# Patient Record
Sex: Female | Born: 1945 | Race: White | Hispanic: No | Smoking: Current every day smoker
Health system: Southern US, Community
[De-identification: ages and names within clinical notes are randomized; demographics above are authoritative.]

## PROBLEM LIST (undated history)

## (undated) DIAGNOSIS — I10 Essential (primary) hypertension: Secondary | ICD-10-CM

## (undated) DIAGNOSIS — K219 Gastro-esophageal reflux disease without esophagitis: Secondary | ICD-10-CM

## (undated) DIAGNOSIS — N289 Disorder of kidney and ureter, unspecified: Secondary | ICD-10-CM

## (undated) DIAGNOSIS — F319 Bipolar disorder, unspecified: Secondary | ICD-10-CM

## (undated) DIAGNOSIS — F419 Anxiety disorder, unspecified: Secondary | ICD-10-CM

## (undated) DIAGNOSIS — F209 Schizophrenia, unspecified: Secondary | ICD-10-CM

## (undated) DIAGNOSIS — F329 Major depressive disorder, single episode, unspecified: Secondary | ICD-10-CM

## (undated) DIAGNOSIS — J449 Chronic obstructive pulmonary disease, unspecified: Secondary | ICD-10-CM

## (undated) DIAGNOSIS — M1990 Unspecified osteoarthritis, unspecified site: Secondary | ICD-10-CM

## (undated) DIAGNOSIS — K228 Other specified diseases of esophagus: Secondary | ICD-10-CM

## (undated) DIAGNOSIS — R2681 Unsteadiness on feet: Secondary | ICD-10-CM

## (undated) DIAGNOSIS — M4800 Spinal stenosis, site unspecified: Secondary | ICD-10-CM

## (undated) DIAGNOSIS — M419 Scoliosis, unspecified: Secondary | ICD-10-CM

## (undated) DIAGNOSIS — L639 Alopecia areata, unspecified: Secondary | ICD-10-CM

## (undated) HISTORY — DX: Spinal stenosis, site unspecified: M48.00

## (undated) HISTORY — DX: Scoliosis, unspecified: M41.9

## (undated) HISTORY — DX: Essential (primary) hypertension: I10

## (undated) HISTORY — DX: Alopecia areata, unspecified: L63.9

## (undated) HISTORY — DX: Chronic obstructive pulmonary disease, unspecified: J44.9

## (undated) HISTORY — DX: Major depressive disorder, single episode, unspecified: F32.9

## (undated) HISTORY — DX: Gastro-esophageal reflux disease without esophagitis: K21.9

## (undated) HISTORY — DX: Anxiety disorder, unspecified: F41.9

## (undated) HISTORY — DX: Disorder of kidney and ureter, unspecified: N28.9

## (undated) HISTORY — DX: Unsteadiness on feet: R26.81

## (undated) HISTORY — DX: Schizophrenia, unspecified: F20.9

## (undated) HISTORY — DX: Bipolar disorder, unspecified: F31.9

## (undated) HISTORY — DX: Unspecified osteoarthritis, unspecified site: M19.90

## (undated) HISTORY — PX: HIP FRACTURE SURGERY: SHX118

## (undated) HISTORY — DX: Other specified diseases of esophagus: K22.8

---

## 2002-03-31 LAB — HM COLONOSCOPY: HM Colonoscopy: NORMAL

## 2003-06-23 ENCOUNTER — Other Ambulatory Visit: Payer: Self-pay

## 2005-09-29 ENCOUNTER — Other Ambulatory Visit: Payer: Self-pay

## 2005-09-29 ENCOUNTER — Emergency Department: Payer: Self-pay | Admitting: Emergency Medicine

## 2006-06-06 ENCOUNTER — Ambulatory Visit: Payer: Self-pay

## 2006-10-09 LAB — HM DEXA SCAN

## 2006-10-19 ENCOUNTER — Emergency Department: Payer: Self-pay | Admitting: Emergency Medicine

## 2008-12-27 ENCOUNTER — Ambulatory Visit: Payer: Self-pay | Admitting: Family Medicine

## 2009-10-26 ENCOUNTER — Inpatient Hospital Stay: Payer: Self-pay | Admitting: *Deleted

## 2009-12-22 ENCOUNTER — Observation Stay: Payer: Self-pay | Admitting: Internal Medicine

## 2010-02-13 ENCOUNTER — Inpatient Hospital Stay: Payer: Self-pay | Admitting: Specialist

## 2010-10-03 ENCOUNTER — Ambulatory Visit: Payer: Self-pay | Admitting: Gastroenterology

## 2011-04-18 LAB — HM PAP SMEAR: HM Pap smear: NEGATIVE

## 2011-07-19 DIAGNOSIS — F209 Schizophrenia, unspecified: Secondary | ICD-10-CM | POA: Diagnosis not present

## 2011-10-11 DIAGNOSIS — F259 Schizoaffective disorder, unspecified: Secondary | ICD-10-CM | POA: Diagnosis not present

## 2011-10-11 DIAGNOSIS — F329 Major depressive disorder, single episode, unspecified: Secondary | ICD-10-CM | POA: Diagnosis not present

## 2011-11-08 DIAGNOSIS — F259 Schizoaffective disorder, unspecified: Secondary | ICD-10-CM | POA: Diagnosis not present

## 2011-11-08 DIAGNOSIS — F329 Major depressive disorder, single episode, unspecified: Secondary | ICD-10-CM | POA: Diagnosis not present

## 2011-12-06 DIAGNOSIS — F259 Schizoaffective disorder, unspecified: Secondary | ICD-10-CM | POA: Diagnosis not present

## 2011-12-06 DIAGNOSIS — F329 Major depressive disorder, single episode, unspecified: Secondary | ICD-10-CM | POA: Diagnosis not present

## 2011-12-27 DIAGNOSIS — J441 Chronic obstructive pulmonary disease with (acute) exacerbation: Secondary | ICD-10-CM | POA: Diagnosis not present

## 2012-01-16 DIAGNOSIS — J441 Chronic obstructive pulmonary disease with (acute) exacerbation: Secondary | ICD-10-CM | POA: Diagnosis not present

## 2012-02-07 DIAGNOSIS — F329 Major depressive disorder, single episode, unspecified: Secondary | ICD-10-CM | POA: Diagnosis not present

## 2012-02-07 DIAGNOSIS — F259 Schizoaffective disorder, unspecified: Secondary | ICD-10-CM | POA: Diagnosis not present

## 2012-02-18 DIAGNOSIS — J449 Chronic obstructive pulmonary disease, unspecified: Secondary | ICD-10-CM | POA: Diagnosis not present

## 2012-02-26 DIAGNOSIS — F259 Schizoaffective disorder, unspecified: Secondary | ICD-10-CM | POA: Diagnosis not present

## 2012-04-03 DIAGNOSIS — F259 Schizoaffective disorder, unspecified: Secondary | ICD-10-CM | POA: Diagnosis not present

## 2012-04-03 DIAGNOSIS — F329 Major depressive disorder, single episode, unspecified: Secondary | ICD-10-CM | POA: Diagnosis not present

## 2012-05-15 DIAGNOSIS — Z23 Encounter for immunization: Secondary | ICD-10-CM | POA: Diagnosis not present

## 2012-06-12 DIAGNOSIS — F259 Schizoaffective disorder, unspecified: Secondary | ICD-10-CM | POA: Diagnosis not present

## 2012-06-12 DIAGNOSIS — F329 Major depressive disorder, single episode, unspecified: Secondary | ICD-10-CM | POA: Diagnosis not present

## 2012-08-12 DIAGNOSIS — F259 Schizoaffective disorder, unspecified: Secondary | ICD-10-CM | POA: Diagnosis not present

## 2012-08-12 DIAGNOSIS — F329 Major depressive disorder, single episode, unspecified: Secondary | ICD-10-CM | POA: Diagnosis not present

## 2012-11-10 DIAGNOSIS — N3090 Cystitis, unspecified without hematuria: Secondary | ICD-10-CM | POA: Diagnosis not present

## 2012-11-20 DIAGNOSIS — N390 Urinary tract infection, site not specified: Secondary | ICD-10-CM | POA: Diagnosis not present

## 2012-11-20 DIAGNOSIS — N3090 Cystitis, unspecified without hematuria: Secondary | ICD-10-CM | POA: Diagnosis not present

## 2012-11-26 DIAGNOSIS — N390 Urinary tract infection, site not specified: Secondary | ICD-10-CM | POA: Diagnosis not present

## 2012-11-27 DIAGNOSIS — N390 Urinary tract infection, site not specified: Secondary | ICD-10-CM | POA: Diagnosis not present

## 2012-11-27 DIAGNOSIS — Z9181 History of falling: Secondary | ICD-10-CM | POA: Diagnosis not present

## 2012-11-27 DIAGNOSIS — J449 Chronic obstructive pulmonary disease, unspecified: Secondary | ICD-10-CM | POA: Diagnosis not present

## 2012-11-27 DIAGNOSIS — F411 Generalized anxiety disorder: Secondary | ICD-10-CM | POA: Diagnosis not present

## 2012-11-27 DIAGNOSIS — M159 Polyosteoarthritis, unspecified: Secondary | ICD-10-CM | POA: Diagnosis not present

## 2012-11-27 DIAGNOSIS — F209 Schizophrenia, unspecified: Secondary | ICD-10-CM | POA: Diagnosis not present

## 2012-11-28 DIAGNOSIS — N390 Urinary tract infection, site not specified: Secondary | ICD-10-CM | POA: Diagnosis not present

## 2012-11-28 DIAGNOSIS — F411 Generalized anxiety disorder: Secondary | ICD-10-CM | POA: Diagnosis not present

## 2012-11-28 DIAGNOSIS — M159 Polyosteoarthritis, unspecified: Secondary | ICD-10-CM | POA: Diagnosis not present

## 2012-11-28 DIAGNOSIS — J449 Chronic obstructive pulmonary disease, unspecified: Secondary | ICD-10-CM | POA: Diagnosis not present

## 2012-11-28 DIAGNOSIS — F209 Schizophrenia, unspecified: Secondary | ICD-10-CM | POA: Diagnosis not present

## 2012-11-28 DIAGNOSIS — Z9181 History of falling: Secondary | ICD-10-CM | POA: Diagnosis not present

## 2012-11-29 DIAGNOSIS — J449 Chronic obstructive pulmonary disease, unspecified: Secondary | ICD-10-CM | POA: Diagnosis not present

## 2012-11-29 DIAGNOSIS — F411 Generalized anxiety disorder: Secondary | ICD-10-CM | POA: Diagnosis not present

## 2012-11-29 DIAGNOSIS — M159 Polyosteoarthritis, unspecified: Secondary | ICD-10-CM | POA: Diagnosis not present

## 2012-11-29 DIAGNOSIS — N390 Urinary tract infection, site not specified: Secondary | ICD-10-CM | POA: Diagnosis not present

## 2012-11-29 DIAGNOSIS — Z9181 History of falling: Secondary | ICD-10-CM | POA: Diagnosis not present

## 2012-11-29 DIAGNOSIS — F209 Schizophrenia, unspecified: Secondary | ICD-10-CM | POA: Diagnosis not present

## 2012-11-30 DIAGNOSIS — F411 Generalized anxiety disorder: Secondary | ICD-10-CM | POA: Diagnosis not present

## 2012-11-30 DIAGNOSIS — J449 Chronic obstructive pulmonary disease, unspecified: Secondary | ICD-10-CM | POA: Diagnosis not present

## 2012-11-30 DIAGNOSIS — F209 Schizophrenia, unspecified: Secondary | ICD-10-CM | POA: Diagnosis not present

## 2012-11-30 DIAGNOSIS — M159 Polyosteoarthritis, unspecified: Secondary | ICD-10-CM | POA: Diagnosis not present

## 2012-11-30 DIAGNOSIS — N390 Urinary tract infection, site not specified: Secondary | ICD-10-CM | POA: Diagnosis not present

## 2012-11-30 DIAGNOSIS — Z9181 History of falling: Secondary | ICD-10-CM | POA: Diagnosis not present

## 2012-12-04 DIAGNOSIS — J449 Chronic obstructive pulmonary disease, unspecified: Secondary | ICD-10-CM | POA: Diagnosis not present

## 2012-12-04 DIAGNOSIS — F209 Schizophrenia, unspecified: Secondary | ICD-10-CM | POA: Diagnosis not present

## 2012-12-04 DIAGNOSIS — N390 Urinary tract infection, site not specified: Secondary | ICD-10-CM | POA: Diagnosis not present

## 2012-12-04 DIAGNOSIS — M159 Polyosteoarthritis, unspecified: Secondary | ICD-10-CM | POA: Diagnosis not present

## 2012-12-05 DIAGNOSIS — F209 Schizophrenia, unspecified: Secondary | ICD-10-CM | POA: Diagnosis not present

## 2012-12-05 DIAGNOSIS — F411 Generalized anxiety disorder: Secondary | ICD-10-CM | POA: Diagnosis not present

## 2012-12-05 DIAGNOSIS — Z9181 History of falling: Secondary | ICD-10-CM | POA: Diagnosis not present

## 2012-12-05 DIAGNOSIS — N390 Urinary tract infection, site not specified: Secondary | ICD-10-CM | POA: Diagnosis not present

## 2012-12-05 DIAGNOSIS — J449 Chronic obstructive pulmonary disease, unspecified: Secondary | ICD-10-CM | POA: Diagnosis not present

## 2012-12-05 DIAGNOSIS — M159 Polyosteoarthritis, unspecified: Secondary | ICD-10-CM | POA: Diagnosis not present

## 2012-12-09 DIAGNOSIS — F209 Schizophrenia, unspecified: Secondary | ICD-10-CM | POA: Diagnosis not present

## 2012-12-09 DIAGNOSIS — J449 Chronic obstructive pulmonary disease, unspecified: Secondary | ICD-10-CM | POA: Diagnosis not present

## 2012-12-09 DIAGNOSIS — F411 Generalized anxiety disorder: Secondary | ICD-10-CM | POA: Diagnosis not present

## 2012-12-09 DIAGNOSIS — Z9181 History of falling: Secondary | ICD-10-CM | POA: Diagnosis not present

## 2012-12-09 DIAGNOSIS — N390 Urinary tract infection, site not specified: Secondary | ICD-10-CM | POA: Diagnosis not present

## 2012-12-09 DIAGNOSIS — M159 Polyosteoarthritis, unspecified: Secondary | ICD-10-CM | POA: Diagnosis not present

## 2012-12-10 DIAGNOSIS — F329 Major depressive disorder, single episode, unspecified: Secondary | ICD-10-CM | POA: Diagnosis not present

## 2012-12-10 DIAGNOSIS — F259 Schizoaffective disorder, unspecified: Secondary | ICD-10-CM | POA: Diagnosis not present

## 2012-12-17 DIAGNOSIS — Z9181 History of falling: Secondary | ICD-10-CM | POA: Diagnosis not present

## 2012-12-17 DIAGNOSIS — J449 Chronic obstructive pulmonary disease, unspecified: Secondary | ICD-10-CM | POA: Diagnosis not present

## 2012-12-17 DIAGNOSIS — F411 Generalized anxiety disorder: Secondary | ICD-10-CM | POA: Diagnosis not present

## 2012-12-17 DIAGNOSIS — M159 Polyosteoarthritis, unspecified: Secondary | ICD-10-CM | POA: Diagnosis not present

## 2012-12-17 DIAGNOSIS — F209 Schizophrenia, unspecified: Secondary | ICD-10-CM | POA: Diagnosis not present

## 2012-12-17 DIAGNOSIS — N390 Urinary tract infection, site not specified: Secondary | ICD-10-CM | POA: Diagnosis not present

## 2013-01-04 ENCOUNTER — Observation Stay: Payer: Self-pay | Admitting: Internal Medicine

## 2013-01-04 DIAGNOSIS — I959 Hypotension, unspecified: Secondary | ICD-10-CM | POA: Diagnosis not present

## 2013-01-04 DIAGNOSIS — M1990 Unspecified osteoarthritis, unspecified site: Secondary | ICD-10-CM | POA: Diagnosis not present

## 2013-01-04 DIAGNOSIS — T50991A Poisoning by other drugs, medicaments and biological substances, accidental (unintentional), initial encounter: Secondary | ICD-10-CM | POA: Diagnosis not present

## 2013-01-04 DIAGNOSIS — K219 Gastro-esophageal reflux disease without esophagitis: Secondary | ICD-10-CM | POA: Diagnosis not present

## 2013-01-04 DIAGNOSIS — R6889 Other general symptoms and signs: Secondary | ICD-10-CM | POA: Diagnosis not present

## 2013-01-04 DIAGNOSIS — F209 Schizophrenia, unspecified: Secondary | ICD-10-CM | POA: Diagnosis not present

## 2013-01-04 DIAGNOSIS — F17200 Nicotine dependence, unspecified, uncomplicated: Secondary | ICD-10-CM | POA: Diagnosis not present

## 2013-01-04 DIAGNOSIS — J449 Chronic obstructive pulmonary disease, unspecified: Secondary | ICD-10-CM | POA: Diagnosis not present

## 2013-01-04 DIAGNOSIS — Z79899 Other long term (current) drug therapy: Secondary | ICD-10-CM | POA: Diagnosis not present

## 2013-01-04 DIAGNOSIS — T465X1A Poisoning by other antihypertensive drugs, accidental (unintentional), initial encounter: Secondary | ICD-10-CM | POA: Diagnosis not present

## 2013-01-04 DIAGNOSIS — F329 Major depressive disorder, single episode, unspecified: Secondary | ICD-10-CM | POA: Diagnosis not present

## 2013-01-04 DIAGNOSIS — I10 Essential (primary) hypertension: Secondary | ICD-10-CM | POA: Diagnosis not present

## 2013-01-04 DIAGNOSIS — I9589 Other hypotension: Secondary | ICD-10-CM | POA: Diagnosis not present

## 2013-01-04 DIAGNOSIS — F411 Generalized anxiety disorder: Secondary | ICD-10-CM | POA: Diagnosis not present

## 2013-01-04 LAB — CBC WITH DIFFERENTIAL/PLATELET
Basophil #: 0.1 10*3/uL (ref 0.0–0.1)
Basophil %: 0.9 %
Eosinophil #: 0.4 10*3/uL (ref 0.0–0.7)
Eosinophil %: 3.7 %
HCT: 41.1 % (ref 35.0–47.0)
Lymphocyte %: 19.5 %
MCH: 32.1 pg (ref 26.0–34.0)
MCHC: 32.8 g/dL (ref 32.0–36.0)
MCV: 98 fL (ref 80–100)
Monocyte #: 0.8 x10 3/mm (ref 0.2–0.9)
Monocyte %: 7.7 %
Neutrophil #: 7 10*3/uL — ABNORMAL HIGH (ref 1.4–6.5)
Neutrophil %: 68.2 %
Platelet: 251 10*3/uL (ref 150–440)
RDW: 13.2 % (ref 11.5–14.5)

## 2013-01-04 LAB — COMPREHENSIVE METABOLIC PANEL
Albumin: 3.4 g/dL (ref 3.4–5.0)
Alkaline Phosphatase: 118 U/L (ref 50–136)
Anion Gap: 9 (ref 7–16)
Calcium, Total: 9.5 mg/dL (ref 8.5–10.1)
Chloride: 105 mmol/L (ref 98–107)
Co2: 23 mmol/L (ref 21–32)
EGFR (Non-African Amer.): 50 — ABNORMAL LOW
Glucose: 129 mg/dL — ABNORMAL HIGH (ref 65–99)
SGOT(AST): 15 U/L (ref 15–37)
Total Protein: 7 g/dL (ref 6.4–8.2)

## 2013-01-05 DIAGNOSIS — I10 Essential (primary) hypertension: Secondary | ICD-10-CM | POA: Diagnosis not present

## 2013-01-05 DIAGNOSIS — F209 Schizophrenia, unspecified: Secondary | ICD-10-CM | POA: Diagnosis not present

## 2013-01-05 DIAGNOSIS — K219 Gastro-esophageal reflux disease without esophagitis: Secondary | ICD-10-CM | POA: Diagnosis not present

## 2013-01-05 DIAGNOSIS — I959 Hypotension, unspecified: Secondary | ICD-10-CM | POA: Diagnosis not present

## 2013-01-23 DIAGNOSIS — I10 Essential (primary) hypertension: Secondary | ICD-10-CM | POA: Diagnosis not present

## 2013-01-23 DIAGNOSIS — F209 Schizophrenia, unspecified: Secondary | ICD-10-CM | POA: Diagnosis not present

## 2013-01-23 DIAGNOSIS — M159 Polyosteoarthritis, unspecified: Secondary | ICD-10-CM | POA: Diagnosis not present

## 2013-01-23 DIAGNOSIS — M4800 Spinal stenosis, site unspecified: Secondary | ICD-10-CM | POA: Diagnosis not present

## 2013-02-20 DIAGNOSIS — M169 Osteoarthritis of hip, unspecified: Secondary | ICD-10-CM | POA: Diagnosis not present

## 2013-02-20 DIAGNOSIS — M4120 Other idiopathic scoliosis, site unspecified: Secondary | ICD-10-CM | POA: Diagnosis not present

## 2013-04-02 DIAGNOSIS — N390 Urinary tract infection, site not specified: Secondary | ICD-10-CM | POA: Diagnosis not present

## 2013-04-02 DIAGNOSIS — Z87440 Personal history of urinary (tract) infections: Secondary | ICD-10-CM | POA: Diagnosis not present

## 2013-04-07 DIAGNOSIS — F329 Major depressive disorder, single episode, unspecified: Secondary | ICD-10-CM | POA: Diagnosis not present

## 2013-04-07 DIAGNOSIS — F259 Schizoaffective disorder, unspecified: Secondary | ICD-10-CM | POA: Diagnosis not present

## 2013-04-07 DIAGNOSIS — F411 Generalized anxiety disorder: Secondary | ICD-10-CM | POA: Diagnosis not present

## 2013-04-07 DIAGNOSIS — R32 Unspecified urinary incontinence: Secondary | ICD-10-CM | POA: Diagnosis not present

## 2013-04-07 DIAGNOSIS — J449 Chronic obstructive pulmonary disease, unspecified: Secondary | ICD-10-CM | POA: Diagnosis not present

## 2013-04-07 DIAGNOSIS — N390 Urinary tract infection, site not specified: Secondary | ICD-10-CM | POA: Diagnosis not present

## 2013-04-07 DIAGNOSIS — Z9181 History of falling: Secondary | ICD-10-CM | POA: Diagnosis not present

## 2013-04-07 DIAGNOSIS — M159 Polyosteoarthritis, unspecified: Secondary | ICD-10-CM | POA: Diagnosis not present

## 2013-04-08 DIAGNOSIS — F259 Schizoaffective disorder, unspecified: Secondary | ICD-10-CM | POA: Diagnosis not present

## 2013-04-08 DIAGNOSIS — F411 Generalized anxiety disorder: Secondary | ICD-10-CM | POA: Diagnosis not present

## 2013-04-08 DIAGNOSIS — M159 Polyosteoarthritis, unspecified: Secondary | ICD-10-CM | POA: Diagnosis not present

## 2013-04-08 DIAGNOSIS — R32 Unspecified urinary incontinence: Secondary | ICD-10-CM | POA: Diagnosis not present

## 2013-04-08 DIAGNOSIS — N390 Urinary tract infection, site not specified: Secondary | ICD-10-CM | POA: Diagnosis not present

## 2013-04-08 DIAGNOSIS — J449 Chronic obstructive pulmonary disease, unspecified: Secondary | ICD-10-CM | POA: Diagnosis not present

## 2013-04-09 DIAGNOSIS — J449 Chronic obstructive pulmonary disease, unspecified: Secondary | ICD-10-CM | POA: Diagnosis not present

## 2013-04-09 DIAGNOSIS — N390 Urinary tract infection, site not specified: Secondary | ICD-10-CM | POA: Diagnosis not present

## 2013-04-09 DIAGNOSIS — F259 Schizoaffective disorder, unspecified: Secondary | ICD-10-CM | POA: Diagnosis not present

## 2013-04-09 DIAGNOSIS — F411 Generalized anxiety disorder: Secondary | ICD-10-CM | POA: Diagnosis not present

## 2013-04-09 DIAGNOSIS — M159 Polyosteoarthritis, unspecified: Secondary | ICD-10-CM | POA: Diagnosis not present

## 2013-04-09 DIAGNOSIS — R32 Unspecified urinary incontinence: Secondary | ICD-10-CM | POA: Diagnosis not present

## 2013-04-10 DIAGNOSIS — F259 Schizoaffective disorder, unspecified: Secondary | ICD-10-CM | POA: Diagnosis not present

## 2013-04-10 DIAGNOSIS — F411 Generalized anxiety disorder: Secondary | ICD-10-CM | POA: Diagnosis not present

## 2013-04-10 DIAGNOSIS — N390 Urinary tract infection, site not specified: Secondary | ICD-10-CM | POA: Diagnosis not present

## 2013-04-10 DIAGNOSIS — M159 Polyosteoarthritis, unspecified: Secondary | ICD-10-CM | POA: Diagnosis not present

## 2013-04-10 DIAGNOSIS — R32 Unspecified urinary incontinence: Secondary | ICD-10-CM | POA: Diagnosis not present

## 2013-04-10 DIAGNOSIS — J449 Chronic obstructive pulmonary disease, unspecified: Secondary | ICD-10-CM | POA: Diagnosis not present

## 2013-04-11 DIAGNOSIS — F411 Generalized anxiety disorder: Secondary | ICD-10-CM | POA: Diagnosis not present

## 2013-04-11 DIAGNOSIS — N390 Urinary tract infection, site not specified: Secondary | ICD-10-CM | POA: Diagnosis not present

## 2013-04-11 DIAGNOSIS — J449 Chronic obstructive pulmonary disease, unspecified: Secondary | ICD-10-CM | POA: Diagnosis not present

## 2013-04-11 DIAGNOSIS — R32 Unspecified urinary incontinence: Secondary | ICD-10-CM | POA: Diagnosis not present

## 2013-04-11 DIAGNOSIS — F259 Schizoaffective disorder, unspecified: Secondary | ICD-10-CM | POA: Diagnosis not present

## 2013-04-11 DIAGNOSIS — M159 Polyosteoarthritis, unspecified: Secondary | ICD-10-CM | POA: Diagnosis not present

## 2013-04-14 DIAGNOSIS — F259 Schizoaffective disorder, unspecified: Secondary | ICD-10-CM | POA: Diagnosis not present

## 2013-04-14 DIAGNOSIS — J449 Chronic obstructive pulmonary disease, unspecified: Secondary | ICD-10-CM | POA: Diagnosis not present

## 2013-04-14 DIAGNOSIS — N390 Urinary tract infection, site not specified: Secondary | ICD-10-CM | POA: Diagnosis not present

## 2013-04-14 DIAGNOSIS — R32 Unspecified urinary incontinence: Secondary | ICD-10-CM | POA: Diagnosis not present

## 2013-04-14 DIAGNOSIS — F411 Generalized anxiety disorder: Secondary | ICD-10-CM | POA: Diagnosis not present

## 2013-04-14 DIAGNOSIS — M159 Polyosteoarthritis, unspecified: Secondary | ICD-10-CM | POA: Diagnosis not present

## 2013-04-15 DIAGNOSIS — F259 Schizoaffective disorder, unspecified: Secondary | ICD-10-CM | POA: Diagnosis not present

## 2013-04-15 DIAGNOSIS — R32 Unspecified urinary incontinence: Secondary | ICD-10-CM | POA: Diagnosis not present

## 2013-04-15 DIAGNOSIS — N390 Urinary tract infection, site not specified: Secondary | ICD-10-CM | POA: Diagnosis not present

## 2013-04-15 DIAGNOSIS — J449 Chronic obstructive pulmonary disease, unspecified: Secondary | ICD-10-CM | POA: Diagnosis not present

## 2013-04-16 DIAGNOSIS — R32 Unspecified urinary incontinence: Secondary | ICD-10-CM | POA: Diagnosis not present

## 2013-04-16 DIAGNOSIS — J449 Chronic obstructive pulmonary disease, unspecified: Secondary | ICD-10-CM | POA: Diagnosis not present

## 2013-04-16 DIAGNOSIS — N390 Urinary tract infection, site not specified: Secondary | ICD-10-CM | POA: Diagnosis not present

## 2013-04-16 DIAGNOSIS — F259 Schizoaffective disorder, unspecified: Secondary | ICD-10-CM | POA: Diagnosis not present

## 2013-04-22 DIAGNOSIS — R32 Unspecified urinary incontinence: Secondary | ICD-10-CM | POA: Diagnosis not present

## 2013-04-22 DIAGNOSIS — J449 Chronic obstructive pulmonary disease, unspecified: Secondary | ICD-10-CM | POA: Diagnosis not present

## 2013-04-22 DIAGNOSIS — M159 Polyosteoarthritis, unspecified: Secondary | ICD-10-CM | POA: Diagnosis not present

## 2013-04-22 DIAGNOSIS — F411 Generalized anxiety disorder: Secondary | ICD-10-CM | POA: Diagnosis not present

## 2013-04-22 DIAGNOSIS — F259 Schizoaffective disorder, unspecified: Secondary | ICD-10-CM | POA: Diagnosis not present

## 2013-04-22 DIAGNOSIS — N390 Urinary tract infection, site not specified: Secondary | ICD-10-CM | POA: Diagnosis not present

## 2013-04-28 DIAGNOSIS — F259 Schizoaffective disorder, unspecified: Secondary | ICD-10-CM | POA: Diagnosis not present

## 2013-04-28 DIAGNOSIS — F411 Generalized anxiety disorder: Secondary | ICD-10-CM | POA: Diagnosis not present

## 2013-04-28 DIAGNOSIS — M159 Polyosteoarthritis, unspecified: Secondary | ICD-10-CM | POA: Diagnosis not present

## 2013-04-28 DIAGNOSIS — J449 Chronic obstructive pulmonary disease, unspecified: Secondary | ICD-10-CM | POA: Diagnosis not present

## 2013-04-28 DIAGNOSIS — Z23 Encounter for immunization: Secondary | ICD-10-CM | POA: Diagnosis not present

## 2013-04-28 DIAGNOSIS — N390 Urinary tract infection, site not specified: Secondary | ICD-10-CM | POA: Diagnosis not present

## 2013-04-28 DIAGNOSIS — R32 Unspecified urinary incontinence: Secondary | ICD-10-CM | POA: Diagnosis not present

## 2013-08-14 DIAGNOSIS — B354 Tinea corporis: Secondary | ICD-10-CM | POA: Diagnosis not present

## 2013-08-14 DIAGNOSIS — F259 Schizoaffective disorder, unspecified: Secondary | ICD-10-CM | POA: Diagnosis not present

## 2013-08-14 DIAGNOSIS — M4800 Spinal stenosis, site unspecified: Secondary | ICD-10-CM | POA: Diagnosis not present

## 2013-08-14 DIAGNOSIS — J449 Chronic obstructive pulmonary disease, unspecified: Secondary | ICD-10-CM | POA: Diagnosis not present

## 2013-09-08 DIAGNOSIS — F329 Major depressive disorder, single episode, unspecified: Secondary | ICD-10-CM | POA: Diagnosis not present

## 2013-09-08 DIAGNOSIS — F259 Schizoaffective disorder, unspecified: Secondary | ICD-10-CM | POA: Diagnosis not present

## 2013-09-08 DIAGNOSIS — F3289 Other specified depressive episodes: Secondary | ICD-10-CM | POA: Diagnosis not present

## 2013-09-09 DIAGNOSIS — F259 Schizoaffective disorder, unspecified: Secondary | ICD-10-CM | POA: Diagnosis not present

## 2013-09-17 DIAGNOSIS — N390 Urinary tract infection, site not specified: Secondary | ICD-10-CM | POA: Diagnosis not present

## 2013-09-17 DIAGNOSIS — Z87440 Personal history of urinary (tract) infections: Secondary | ICD-10-CM | POA: Diagnosis not present

## 2013-09-19 DIAGNOSIS — F411 Generalized anxiety disorder: Secondary | ICD-10-CM | POA: Diagnosis not present

## 2013-09-19 DIAGNOSIS — R32 Unspecified urinary incontinence: Secondary | ICD-10-CM | POA: Diagnosis not present

## 2013-09-19 DIAGNOSIS — F329 Major depressive disorder, single episode, unspecified: Secondary | ICD-10-CM | POA: Diagnosis not present

## 2013-09-19 DIAGNOSIS — Z8781 Personal history of (healed) traumatic fracture: Secondary | ICD-10-CM | POA: Diagnosis not present

## 2013-09-19 DIAGNOSIS — Z792 Long term (current) use of antibiotics: Secondary | ICD-10-CM | POA: Diagnosis not present

## 2013-09-19 DIAGNOSIS — Z9181 History of falling: Secondary | ICD-10-CM | POA: Diagnosis not present

## 2013-09-19 DIAGNOSIS — F209 Schizophrenia, unspecified: Secondary | ICD-10-CM | POA: Diagnosis not present

## 2013-09-19 DIAGNOSIS — F17200 Nicotine dependence, unspecified, uncomplicated: Secondary | ICD-10-CM | POA: Diagnosis not present

## 2013-09-19 DIAGNOSIS — J449 Chronic obstructive pulmonary disease, unspecified: Secondary | ICD-10-CM | POA: Diagnosis not present

## 2013-09-19 DIAGNOSIS — F3289 Other specified depressive episodes: Secondary | ICD-10-CM | POA: Diagnosis not present

## 2013-09-19 DIAGNOSIS — N390 Urinary tract infection, site not specified: Secondary | ICD-10-CM | POA: Diagnosis not present

## 2013-09-19 DIAGNOSIS — M1990 Unspecified osteoarthritis, unspecified site: Secondary | ICD-10-CM | POA: Diagnosis not present

## 2013-09-19 DIAGNOSIS — K5289 Other specified noninfective gastroenteritis and colitis: Secondary | ICD-10-CM | POA: Diagnosis not present

## 2013-09-20 DIAGNOSIS — F329 Major depressive disorder, single episode, unspecified: Secondary | ICD-10-CM | POA: Diagnosis not present

## 2013-09-20 DIAGNOSIS — F3289 Other specified depressive episodes: Secondary | ICD-10-CM | POA: Diagnosis not present

## 2013-09-20 DIAGNOSIS — J449 Chronic obstructive pulmonary disease, unspecified: Secondary | ICD-10-CM | POA: Diagnosis not present

## 2013-09-20 DIAGNOSIS — N390 Urinary tract infection, site not specified: Secondary | ICD-10-CM | POA: Diagnosis not present

## 2013-09-20 DIAGNOSIS — F411 Generalized anxiety disorder: Secondary | ICD-10-CM | POA: Diagnosis not present

## 2013-09-20 DIAGNOSIS — R32 Unspecified urinary incontinence: Secondary | ICD-10-CM | POA: Diagnosis not present

## 2013-09-20 DIAGNOSIS — F209 Schizophrenia, unspecified: Secondary | ICD-10-CM | POA: Diagnosis not present

## 2013-09-21 DIAGNOSIS — F209 Schizophrenia, unspecified: Secondary | ICD-10-CM | POA: Diagnosis not present

## 2013-09-21 DIAGNOSIS — F3289 Other specified depressive episodes: Secondary | ICD-10-CM | POA: Diagnosis not present

## 2013-09-21 DIAGNOSIS — F411 Generalized anxiety disorder: Secondary | ICD-10-CM | POA: Diagnosis not present

## 2013-09-21 DIAGNOSIS — J449 Chronic obstructive pulmonary disease, unspecified: Secondary | ICD-10-CM | POA: Diagnosis not present

## 2013-09-21 DIAGNOSIS — R32 Unspecified urinary incontinence: Secondary | ICD-10-CM | POA: Diagnosis not present

## 2013-09-21 DIAGNOSIS — F329 Major depressive disorder, single episode, unspecified: Secondary | ICD-10-CM | POA: Diagnosis not present

## 2013-09-21 DIAGNOSIS — N390 Urinary tract infection, site not specified: Secondary | ICD-10-CM | POA: Diagnosis not present

## 2013-09-22 DIAGNOSIS — F3289 Other specified depressive episodes: Secondary | ICD-10-CM | POA: Diagnosis not present

## 2013-09-22 DIAGNOSIS — F329 Major depressive disorder, single episode, unspecified: Secondary | ICD-10-CM | POA: Diagnosis not present

## 2013-09-22 DIAGNOSIS — N390 Urinary tract infection, site not specified: Secondary | ICD-10-CM | POA: Diagnosis not present

## 2013-09-22 DIAGNOSIS — R32 Unspecified urinary incontinence: Secondary | ICD-10-CM | POA: Diagnosis not present

## 2013-09-22 DIAGNOSIS — J449 Chronic obstructive pulmonary disease, unspecified: Secondary | ICD-10-CM | POA: Diagnosis not present

## 2013-09-22 DIAGNOSIS — F411 Generalized anxiety disorder: Secondary | ICD-10-CM | POA: Diagnosis not present

## 2013-09-22 DIAGNOSIS — F209 Schizophrenia, unspecified: Secondary | ICD-10-CM | POA: Diagnosis not present

## 2013-09-23 DIAGNOSIS — F329 Major depressive disorder, single episode, unspecified: Secondary | ICD-10-CM | POA: Diagnosis not present

## 2013-09-23 DIAGNOSIS — N390 Urinary tract infection, site not specified: Secondary | ICD-10-CM | POA: Diagnosis not present

## 2013-09-23 DIAGNOSIS — R32 Unspecified urinary incontinence: Secondary | ICD-10-CM | POA: Diagnosis not present

## 2013-09-23 DIAGNOSIS — F411 Generalized anxiety disorder: Secondary | ICD-10-CM | POA: Diagnosis not present

## 2013-09-23 DIAGNOSIS — J449 Chronic obstructive pulmonary disease, unspecified: Secondary | ICD-10-CM | POA: Diagnosis not present

## 2013-09-23 DIAGNOSIS — F209 Schizophrenia, unspecified: Secondary | ICD-10-CM | POA: Diagnosis not present

## 2013-09-23 DIAGNOSIS — F3289 Other specified depressive episodes: Secondary | ICD-10-CM | POA: Diagnosis not present

## 2013-09-24 DIAGNOSIS — F411 Generalized anxiety disorder: Secondary | ICD-10-CM | POA: Diagnosis not present

## 2013-09-24 DIAGNOSIS — F209 Schizophrenia, unspecified: Secondary | ICD-10-CM | POA: Diagnosis not present

## 2013-09-24 DIAGNOSIS — J449 Chronic obstructive pulmonary disease, unspecified: Secondary | ICD-10-CM | POA: Diagnosis not present

## 2013-09-24 DIAGNOSIS — R32 Unspecified urinary incontinence: Secondary | ICD-10-CM | POA: Diagnosis not present

## 2013-09-24 DIAGNOSIS — F329 Major depressive disorder, single episode, unspecified: Secondary | ICD-10-CM | POA: Diagnosis not present

## 2013-09-24 DIAGNOSIS — F3289 Other specified depressive episodes: Secondary | ICD-10-CM | POA: Diagnosis not present

## 2013-09-24 DIAGNOSIS — N390 Urinary tract infection, site not specified: Secondary | ICD-10-CM | POA: Diagnosis not present

## 2013-09-25 DIAGNOSIS — J449 Chronic obstructive pulmonary disease, unspecified: Secondary | ICD-10-CM | POA: Diagnosis not present

## 2013-09-25 DIAGNOSIS — F3289 Other specified depressive episodes: Secondary | ICD-10-CM | POA: Diagnosis not present

## 2013-09-25 DIAGNOSIS — F411 Generalized anxiety disorder: Secondary | ICD-10-CM | POA: Diagnosis not present

## 2013-09-25 DIAGNOSIS — R32 Unspecified urinary incontinence: Secondary | ICD-10-CM | POA: Diagnosis not present

## 2013-09-25 DIAGNOSIS — N390 Urinary tract infection, site not specified: Secondary | ICD-10-CM | POA: Diagnosis not present

## 2013-09-25 DIAGNOSIS — F209 Schizophrenia, unspecified: Secondary | ICD-10-CM | POA: Diagnosis not present

## 2013-09-25 DIAGNOSIS — F329 Major depressive disorder, single episode, unspecified: Secondary | ICD-10-CM | POA: Diagnosis not present

## 2013-09-26 DIAGNOSIS — J449 Chronic obstructive pulmonary disease, unspecified: Secondary | ICD-10-CM | POA: Diagnosis not present

## 2013-09-26 DIAGNOSIS — F209 Schizophrenia, unspecified: Secondary | ICD-10-CM | POA: Diagnosis not present

## 2013-09-26 DIAGNOSIS — F411 Generalized anxiety disorder: Secondary | ICD-10-CM | POA: Diagnosis not present

## 2013-09-26 DIAGNOSIS — F3289 Other specified depressive episodes: Secondary | ICD-10-CM | POA: Diagnosis not present

## 2013-09-26 DIAGNOSIS — R32 Unspecified urinary incontinence: Secondary | ICD-10-CM | POA: Diagnosis not present

## 2013-09-26 DIAGNOSIS — F329 Major depressive disorder, single episode, unspecified: Secondary | ICD-10-CM | POA: Diagnosis not present

## 2013-09-26 DIAGNOSIS — N390 Urinary tract infection, site not specified: Secondary | ICD-10-CM | POA: Diagnosis not present

## 2013-09-27 DIAGNOSIS — J449 Chronic obstructive pulmonary disease, unspecified: Secondary | ICD-10-CM | POA: Diagnosis not present

## 2013-09-27 DIAGNOSIS — N390 Urinary tract infection, site not specified: Secondary | ICD-10-CM | POA: Diagnosis not present

## 2013-09-27 DIAGNOSIS — F209 Schizophrenia, unspecified: Secondary | ICD-10-CM | POA: Diagnosis not present

## 2013-09-27 DIAGNOSIS — F329 Major depressive disorder, single episode, unspecified: Secondary | ICD-10-CM | POA: Diagnosis not present

## 2013-09-27 DIAGNOSIS — R32 Unspecified urinary incontinence: Secondary | ICD-10-CM | POA: Diagnosis not present

## 2013-09-27 DIAGNOSIS — F3289 Other specified depressive episodes: Secondary | ICD-10-CM | POA: Diagnosis not present

## 2013-09-27 DIAGNOSIS — F411 Generalized anxiety disorder: Secondary | ICD-10-CM | POA: Diagnosis not present

## 2013-09-30 ENCOUNTER — Ambulatory Visit: Payer: Self-pay | Admitting: Pain Medicine

## 2013-09-30 DIAGNOSIS — E669 Obesity, unspecified: Secondary | ICD-10-CM | POA: Diagnosis not present

## 2013-09-30 DIAGNOSIS — N390 Urinary tract infection, site not specified: Secondary | ICD-10-CM | POA: Diagnosis not present

## 2013-09-30 DIAGNOSIS — R32 Unspecified urinary incontinence: Secondary | ICD-10-CM | POA: Diagnosis not present

## 2013-09-30 DIAGNOSIS — Z5181 Encounter for therapeutic drug level monitoring: Secondary | ICD-10-CM | POA: Diagnosis not present

## 2013-09-30 DIAGNOSIS — Z79899 Other long term (current) drug therapy: Secondary | ICD-10-CM | POA: Diagnosis not present

## 2013-09-30 DIAGNOSIS — E559 Vitamin D deficiency, unspecified: Secondary | ICD-10-CM | POA: Diagnosis not present

## 2013-09-30 DIAGNOSIS — F209 Schizophrenia, unspecified: Secondary | ICD-10-CM | POA: Diagnosis not present

## 2013-09-30 DIAGNOSIS — M47817 Spondylosis without myelopathy or radiculopathy, lumbosacral region: Secondary | ICD-10-CM | POA: Diagnosis not present

## 2013-09-30 DIAGNOSIS — G894 Chronic pain syndrome: Secondary | ICD-10-CM | POA: Diagnosis not present

## 2013-09-30 DIAGNOSIS — K219 Gastro-esophageal reflux disease without esophagitis: Secondary | ICD-10-CM | POA: Diagnosis not present

## 2013-09-30 DIAGNOSIS — F3289 Other specified depressive episodes: Secondary | ICD-10-CM | POA: Diagnosis not present

## 2013-09-30 DIAGNOSIS — M25569 Pain in unspecified knee: Secondary | ICD-10-CM | POA: Diagnosis not present

## 2013-09-30 DIAGNOSIS — F329 Major depressive disorder, single episode, unspecified: Secondary | ICD-10-CM | POA: Diagnosis not present

## 2013-09-30 DIAGNOSIS — K2970 Gastritis, unspecified, without bleeding: Secondary | ICD-10-CM | POA: Diagnosis not present

## 2013-09-30 DIAGNOSIS — M25559 Pain in unspecified hip: Secondary | ICD-10-CM | POA: Diagnosis not present

## 2013-09-30 DIAGNOSIS — M5137 Other intervertebral disc degeneration, lumbosacral region: Secondary | ICD-10-CM | POA: Diagnosis not present

## 2013-09-30 DIAGNOSIS — IMO0002 Reserved for concepts with insufficient information to code with codable children: Secondary | ICD-10-CM | POA: Diagnosis not present

## 2013-09-30 DIAGNOSIS — IMO0001 Reserved for inherently not codable concepts without codable children: Secondary | ICD-10-CM | POA: Diagnosis not present

## 2013-09-30 DIAGNOSIS — F17200 Nicotine dependence, unspecified, uncomplicated: Secondary | ICD-10-CM | POA: Diagnosis not present

## 2013-09-30 DIAGNOSIS — J449 Chronic obstructive pulmonary disease, unspecified: Secondary | ICD-10-CM | POA: Diagnosis not present

## 2013-09-30 DIAGNOSIS — M810 Age-related osteoporosis without current pathological fracture: Secondary | ICD-10-CM | POA: Diagnosis not present

## 2013-09-30 DIAGNOSIS — G8929 Other chronic pain: Secondary | ICD-10-CM | POA: Diagnosis not present

## 2013-09-30 DIAGNOSIS — M4716 Other spondylosis with myelopathy, lumbar region: Secondary | ICD-10-CM | POA: Diagnosis not present

## 2013-09-30 DIAGNOSIS — F411 Generalized anxiety disorder: Secondary | ICD-10-CM | POA: Diagnosis not present

## 2013-10-01 DIAGNOSIS — F259 Schizoaffective disorder, unspecified: Secondary | ICD-10-CM | POA: Diagnosis not present

## 2013-10-06 DIAGNOSIS — F209 Schizophrenia, unspecified: Secondary | ICD-10-CM | POA: Diagnosis not present

## 2013-10-06 DIAGNOSIS — F3289 Other specified depressive episodes: Secondary | ICD-10-CM | POA: Diagnosis not present

## 2013-10-06 DIAGNOSIS — R32 Unspecified urinary incontinence: Secondary | ICD-10-CM | POA: Diagnosis not present

## 2013-10-06 DIAGNOSIS — J449 Chronic obstructive pulmonary disease, unspecified: Secondary | ICD-10-CM | POA: Diagnosis not present

## 2013-10-06 DIAGNOSIS — F411 Generalized anxiety disorder: Secondary | ICD-10-CM | POA: Diagnosis not present

## 2013-10-06 DIAGNOSIS — N390 Urinary tract infection, site not specified: Secondary | ICD-10-CM | POA: Diagnosis not present

## 2013-10-06 DIAGNOSIS — F329 Major depressive disorder, single episode, unspecified: Secondary | ICD-10-CM | POA: Diagnosis not present

## 2013-10-19 ENCOUNTER — Ambulatory Visit: Payer: Self-pay | Admitting: Pain Medicine

## 2013-10-19 DIAGNOSIS — M47817 Spondylosis without myelopathy or radiculopathy, lumbosacral region: Secondary | ICD-10-CM | POA: Diagnosis not present

## 2013-10-19 DIAGNOSIS — G894 Chronic pain syndrome: Secondary | ICD-10-CM | POA: Diagnosis not present

## 2013-10-19 DIAGNOSIS — M47814 Spondylosis without myelopathy or radiculopathy, thoracic region: Secondary | ICD-10-CM | POA: Diagnosis not present

## 2013-10-19 LAB — BASIC METABOLIC PANEL
Anion Gap: 3 — ABNORMAL LOW (ref 7–16)
BUN: 14 mg/dL (ref 7–18)
Calcium, Total: 9.2 mg/dL (ref 8.5–10.1)
Chloride: 105 mmol/L (ref 98–107)
Co2: 29 mmol/L (ref 21–32)
Creatinine: 0.86 mg/dL (ref 0.60–1.30)
Glucose: 90 mg/dL (ref 65–99)
OSMOLALITY: 274 (ref 275–301)
Potassium: 4 mmol/L (ref 3.5–5.1)
Sodium: 137 mmol/L (ref 136–145)

## 2013-10-19 LAB — HEPATIC FUNCTION PANEL A (ARMC)
ALBUMIN: 3.4 g/dL (ref 3.4–5.0)
ALK PHOS: 97 U/L
AST: 23 U/L (ref 15–37)
Bilirubin, Direct: <0.1 mg/dL (ref 0.00–0.20)
Bilirubin,Total: 0.3 mg/dL (ref 0.2–1.0)
SGPT (ALT): 21 U/L (ref 12–78)
Total Protein: 7 g/dL (ref 6.4–8.2)

## 2013-10-19 LAB — SEDIMENTATION RATE: Erythrocyte Sed Rate: 3 mm/hr (ref 0–30)

## 2013-10-19 LAB — MAGNESIUM: MAGNESIUM: 1.7 mg/dL — AB

## 2013-11-18 DIAGNOSIS — Z87440 Personal history of urinary (tract) infections: Secondary | ICD-10-CM | POA: Diagnosis not present

## 2013-11-26 DIAGNOSIS — F259 Schizoaffective disorder, unspecified: Secondary | ICD-10-CM | POA: Diagnosis not present

## 2013-11-27 DIAGNOSIS — J441 Chronic obstructive pulmonary disease with (acute) exacerbation: Secondary | ICD-10-CM | POA: Diagnosis not present

## 2013-11-27 DIAGNOSIS — J449 Chronic obstructive pulmonary disease, unspecified: Secondary | ICD-10-CM | POA: Diagnosis not present

## 2013-12-02 DIAGNOSIS — J449 Chronic obstructive pulmonary disease, unspecified: Secondary | ICD-10-CM | POA: Diagnosis not present

## 2013-12-02 DIAGNOSIS — J441 Chronic obstructive pulmonary disease with (acute) exacerbation: Secondary | ICD-10-CM | POA: Diagnosis not present

## 2014-01-13 DIAGNOSIS — N3090 Cystitis, unspecified without hematuria: Secondary | ICD-10-CM | POA: Diagnosis not present

## 2014-01-15 DIAGNOSIS — R059 Cough, unspecified: Secondary | ICD-10-CM | POA: Diagnosis not present

## 2014-01-15 DIAGNOSIS — R05 Cough: Secondary | ICD-10-CM | POA: Diagnosis not present

## 2014-01-15 DIAGNOSIS — J449 Chronic obstructive pulmonary disease, unspecified: Secondary | ICD-10-CM | POA: Diagnosis not present

## 2014-01-15 DIAGNOSIS — N390 Urinary tract infection, site not specified: Secondary | ICD-10-CM | POA: Diagnosis not present

## 2014-01-16 DIAGNOSIS — F329 Major depressive disorder, single episode, unspecified: Secondary | ICD-10-CM | POA: Diagnosis not present

## 2014-01-16 DIAGNOSIS — Z792 Long term (current) use of antibiotics: Secondary | ICD-10-CM | POA: Diagnosis not present

## 2014-01-16 DIAGNOSIS — Z9181 History of falling: Secondary | ICD-10-CM | POA: Diagnosis not present

## 2014-01-16 DIAGNOSIS — J449 Chronic obstructive pulmonary disease, unspecified: Secondary | ICD-10-CM | POA: Diagnosis not present

## 2014-01-16 DIAGNOSIS — F209 Schizophrenia, unspecified: Secondary | ICD-10-CM | POA: Diagnosis not present

## 2014-01-16 DIAGNOSIS — F3289 Other specified depressive episodes: Secondary | ICD-10-CM | POA: Diagnosis not present

## 2014-01-16 DIAGNOSIS — N390 Urinary tract infection, site not specified: Secondary | ICD-10-CM | POA: Diagnosis not present

## 2014-01-16 DIAGNOSIS — R32 Unspecified urinary incontinence: Secondary | ICD-10-CM | POA: Diagnosis not present

## 2014-01-17 DIAGNOSIS — R32 Unspecified urinary incontinence: Secondary | ICD-10-CM | POA: Diagnosis not present

## 2014-01-17 DIAGNOSIS — F329 Major depressive disorder, single episode, unspecified: Secondary | ICD-10-CM | POA: Diagnosis not present

## 2014-01-17 DIAGNOSIS — J449 Chronic obstructive pulmonary disease, unspecified: Secondary | ICD-10-CM | POA: Diagnosis not present

## 2014-01-17 DIAGNOSIS — F3289 Other specified depressive episodes: Secondary | ICD-10-CM | POA: Diagnosis not present

## 2014-01-17 DIAGNOSIS — Z792 Long term (current) use of antibiotics: Secondary | ICD-10-CM | POA: Diagnosis not present

## 2014-01-17 DIAGNOSIS — F209 Schizophrenia, unspecified: Secondary | ICD-10-CM | POA: Diagnosis not present

## 2014-01-17 DIAGNOSIS — N390 Urinary tract infection, site not specified: Secondary | ICD-10-CM | POA: Diagnosis not present

## 2014-01-18 ENCOUNTER — Ambulatory Visit: Payer: Self-pay | Admitting: Family Medicine

## 2014-01-18 DIAGNOSIS — R32 Unspecified urinary incontinence: Secondary | ICD-10-CM | POA: Diagnosis not present

## 2014-01-18 DIAGNOSIS — F209 Schizophrenia, unspecified: Secondary | ICD-10-CM | POA: Diagnosis not present

## 2014-01-18 DIAGNOSIS — R059 Cough, unspecified: Secondary | ICD-10-CM | POA: Diagnosis not present

## 2014-01-18 DIAGNOSIS — R05 Cough: Secondary | ICD-10-CM | POA: Diagnosis not present

## 2014-01-18 DIAGNOSIS — J449 Chronic obstructive pulmonary disease, unspecified: Secondary | ICD-10-CM | POA: Diagnosis not present

## 2014-01-18 DIAGNOSIS — N390 Urinary tract infection, site not specified: Secondary | ICD-10-CM | POA: Diagnosis not present

## 2014-01-18 DIAGNOSIS — F3289 Other specified depressive episodes: Secondary | ICD-10-CM | POA: Diagnosis not present

## 2014-01-18 DIAGNOSIS — F329 Major depressive disorder, single episode, unspecified: Secondary | ICD-10-CM | POA: Diagnosis not present

## 2014-01-18 DIAGNOSIS — Z792 Long term (current) use of antibiotics: Secondary | ICD-10-CM | POA: Diagnosis not present

## 2014-01-19 DIAGNOSIS — R32 Unspecified urinary incontinence: Secondary | ICD-10-CM | POA: Diagnosis not present

## 2014-01-19 DIAGNOSIS — F209 Schizophrenia, unspecified: Secondary | ICD-10-CM | POA: Diagnosis not present

## 2014-01-19 DIAGNOSIS — F329 Major depressive disorder, single episode, unspecified: Secondary | ICD-10-CM | POA: Diagnosis not present

## 2014-01-19 DIAGNOSIS — Z792 Long term (current) use of antibiotics: Secondary | ICD-10-CM | POA: Diagnosis not present

## 2014-01-19 DIAGNOSIS — N390 Urinary tract infection, site not specified: Secondary | ICD-10-CM | POA: Diagnosis not present

## 2014-01-19 DIAGNOSIS — F3289 Other specified depressive episodes: Secondary | ICD-10-CM | POA: Diagnosis not present

## 2014-01-19 DIAGNOSIS — J449 Chronic obstructive pulmonary disease, unspecified: Secondary | ICD-10-CM | POA: Diagnosis not present

## 2014-01-22 DIAGNOSIS — J449 Chronic obstructive pulmonary disease, unspecified: Secondary | ICD-10-CM | POA: Diagnosis not present

## 2014-01-22 DIAGNOSIS — F329 Major depressive disorder, single episode, unspecified: Secondary | ICD-10-CM | POA: Diagnosis not present

## 2014-01-22 DIAGNOSIS — R32 Unspecified urinary incontinence: Secondary | ICD-10-CM | POA: Diagnosis not present

## 2014-01-22 DIAGNOSIS — N390 Urinary tract infection, site not specified: Secondary | ICD-10-CM | POA: Diagnosis not present

## 2014-01-22 DIAGNOSIS — F3289 Other specified depressive episodes: Secondary | ICD-10-CM | POA: Diagnosis not present

## 2014-01-22 DIAGNOSIS — F209 Schizophrenia, unspecified: Secondary | ICD-10-CM | POA: Diagnosis not present

## 2014-01-22 DIAGNOSIS — Z792 Long term (current) use of antibiotics: Secondary | ICD-10-CM | POA: Diagnosis not present

## 2014-02-12 DIAGNOSIS — N390 Urinary tract infection, site not specified: Secondary | ICD-10-CM | POA: Diagnosis not present

## 2014-02-12 DIAGNOSIS — N819 Female genital prolapse, unspecified: Secondary | ICD-10-CM | POA: Diagnosis not present

## 2014-02-19 DIAGNOSIS — J441 Chronic obstructive pulmonary disease with (acute) exacerbation: Secondary | ICD-10-CM | POA: Diagnosis not present

## 2014-02-19 DIAGNOSIS — F329 Major depressive disorder, single episode, unspecified: Secondary | ICD-10-CM | POA: Diagnosis not present

## 2014-02-19 DIAGNOSIS — J449 Chronic obstructive pulmonary disease, unspecified: Secondary | ICD-10-CM | POA: Diagnosis not present

## 2014-02-19 DIAGNOSIS — Z7189 Other specified counseling: Secondary | ICD-10-CM | POA: Diagnosis not present

## 2014-02-19 DIAGNOSIS — F3289 Other specified depressive episodes: Secondary | ICD-10-CM | POA: Diagnosis not present

## 2014-04-07 DIAGNOSIS — F259 Schizoaffective disorder, unspecified: Secondary | ICD-10-CM | POA: Diagnosis not present

## 2014-04-07 DIAGNOSIS — Z79899 Other long term (current) drug therapy: Secondary | ICD-10-CM | POA: Diagnosis not present

## 2014-04-29 DIAGNOSIS — N390 Urinary tract infection, site not specified: Secondary | ICD-10-CM | POA: Diagnosis not present

## 2014-05-03 DIAGNOSIS — Z23 Encounter for immunization: Secondary | ICD-10-CM | POA: Diagnosis not present

## 2014-05-17 DIAGNOSIS — H61103 Unspecified noninfective disorders of pinna, bilateral: Secondary | ICD-10-CM | POA: Diagnosis not present

## 2014-09-17 DIAGNOSIS — I10 Essential (primary) hypertension: Secondary | ICD-10-CM | POA: Diagnosis not present

## 2014-09-17 DIAGNOSIS — Z87440 Personal history of urinary (tract) infections: Secondary | ICD-10-CM | POA: Diagnosis not present

## 2014-09-17 DIAGNOSIS — J449 Chronic obstructive pulmonary disease, unspecified: Secondary | ICD-10-CM | POA: Diagnosis not present

## 2014-09-21 DIAGNOSIS — N3090 Cystitis, unspecified without hematuria: Secondary | ICD-10-CM | POA: Diagnosis not present

## 2014-09-30 DIAGNOSIS — F418 Other specified anxiety disorders: Secondary | ICD-10-CM | POA: Diagnosis not present

## 2014-09-30 DIAGNOSIS — F209 Schizophrenia, unspecified: Secondary | ICD-10-CM | POA: Diagnosis not present

## 2014-09-30 DIAGNOSIS — F329 Major depressive disorder, single episode, unspecified: Secondary | ICD-10-CM | POA: Diagnosis not present

## 2014-10-07 IMAGING — CR DG THORACIC SPINE 2-3V
1 series · 3 of 3 positions shown · non-contrast
Comparison: None.

CLINICAL DATA: chronic back pain, prev injury

EXAM:
THORACIC SPINE - 2 VIEW

[Series 1: w thoracic spine ap · 0.14mm/px · 3 of 3 slices shown]
[im 1/3]
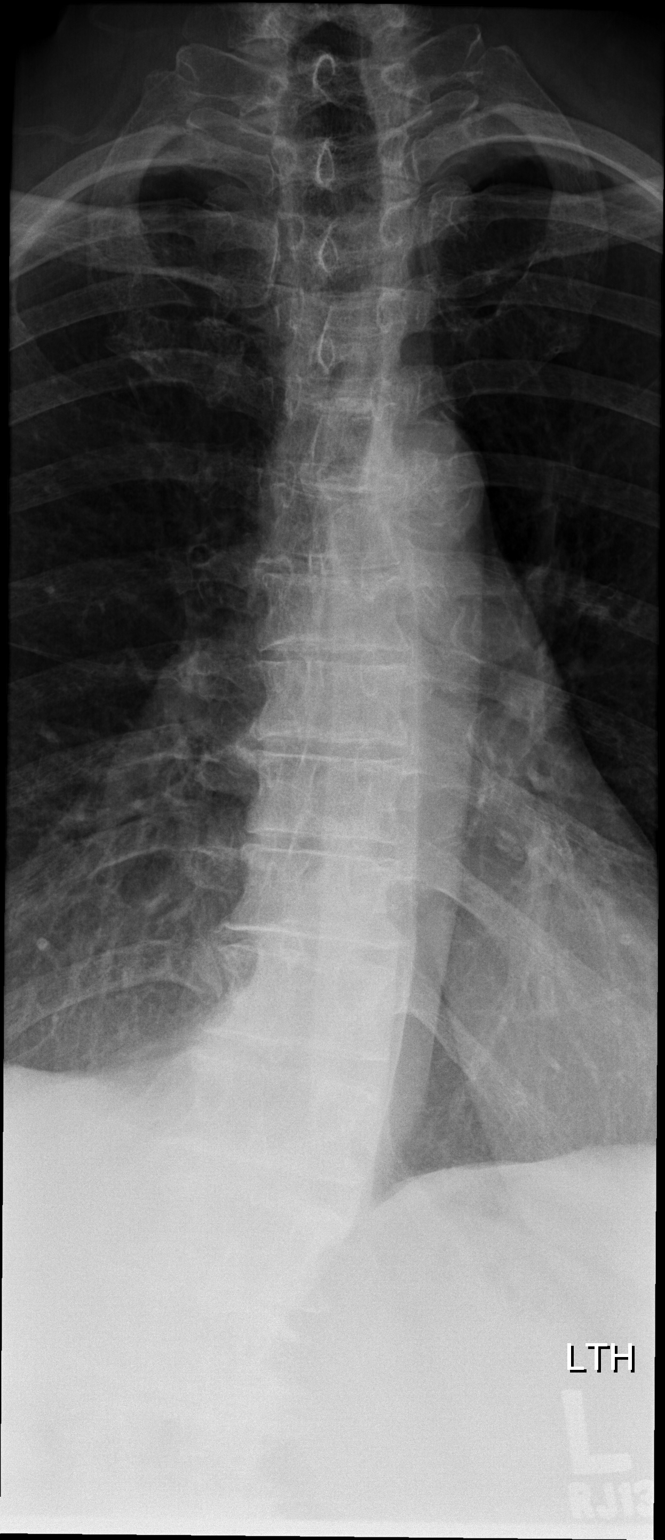
[im 2/3]
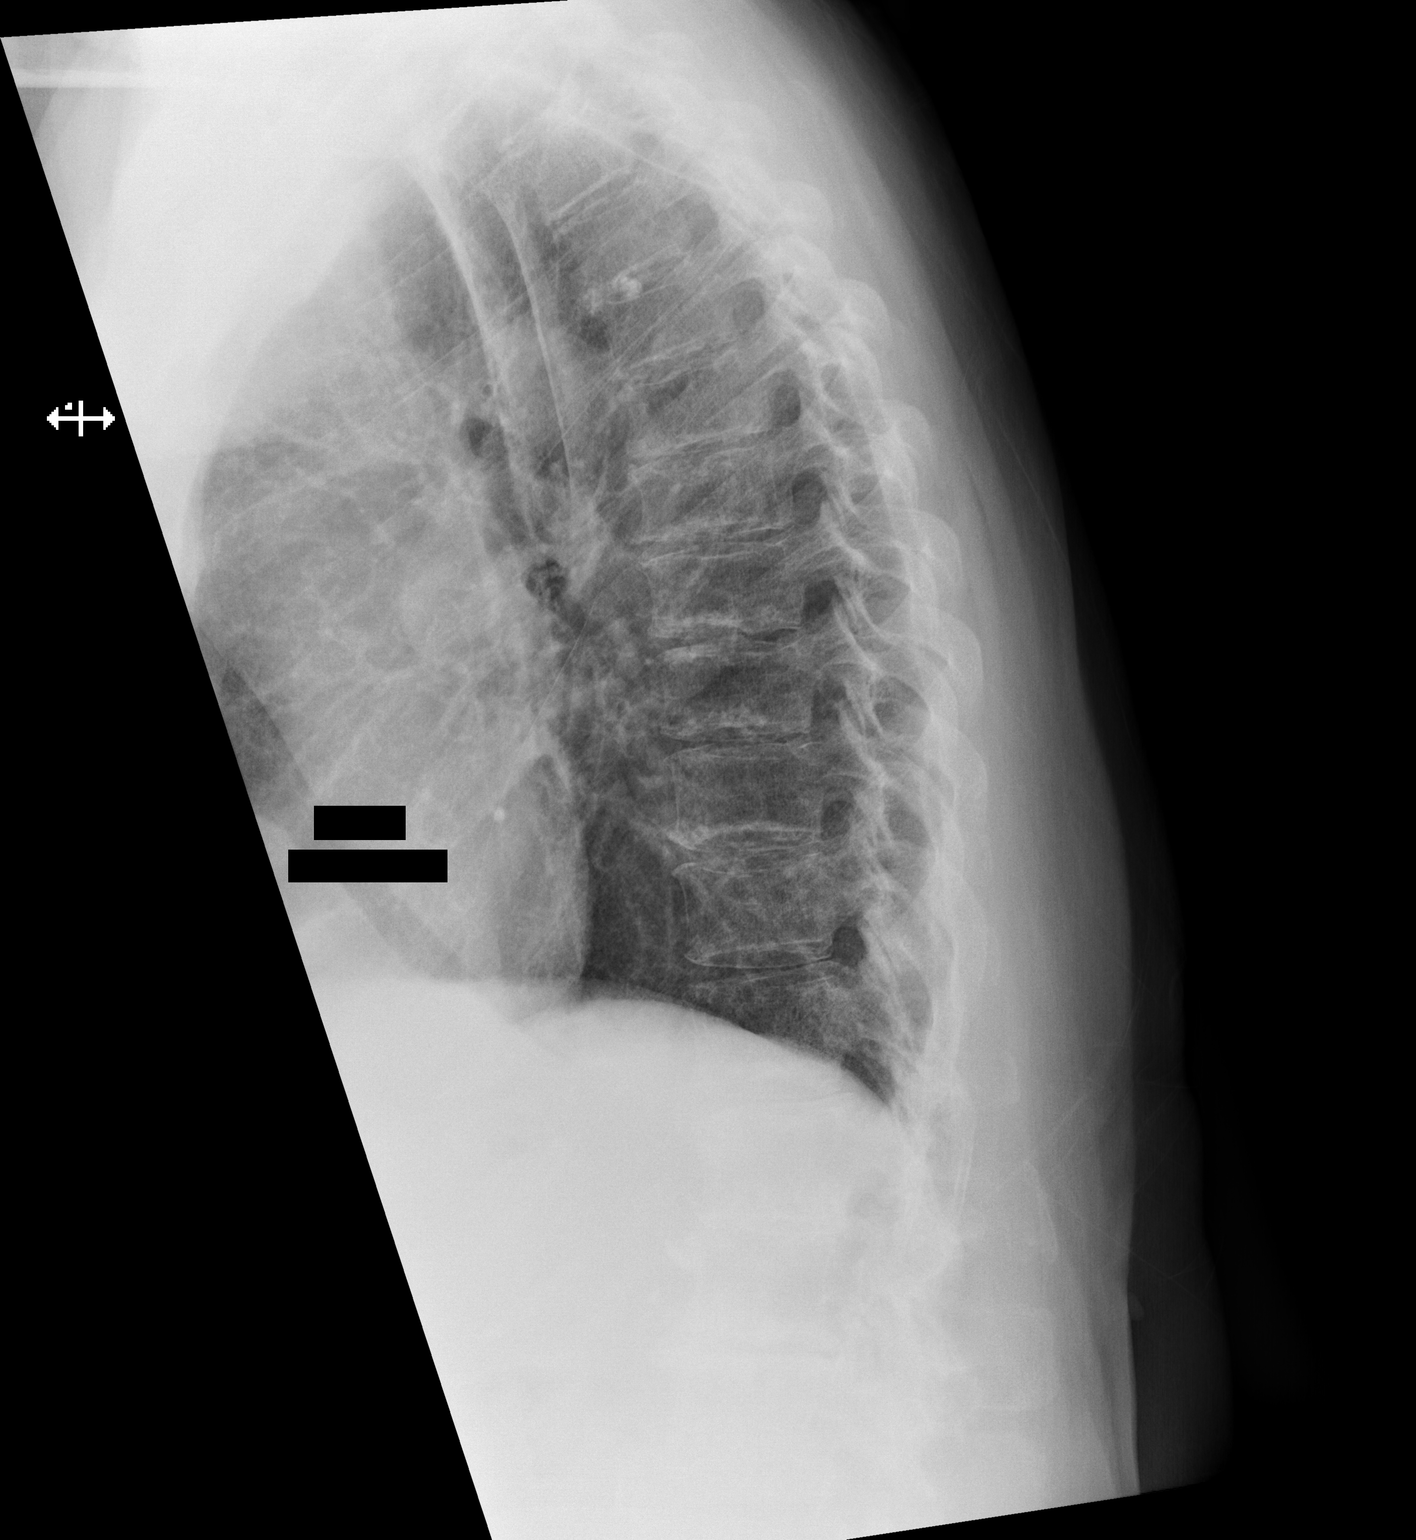
[im 3/3]
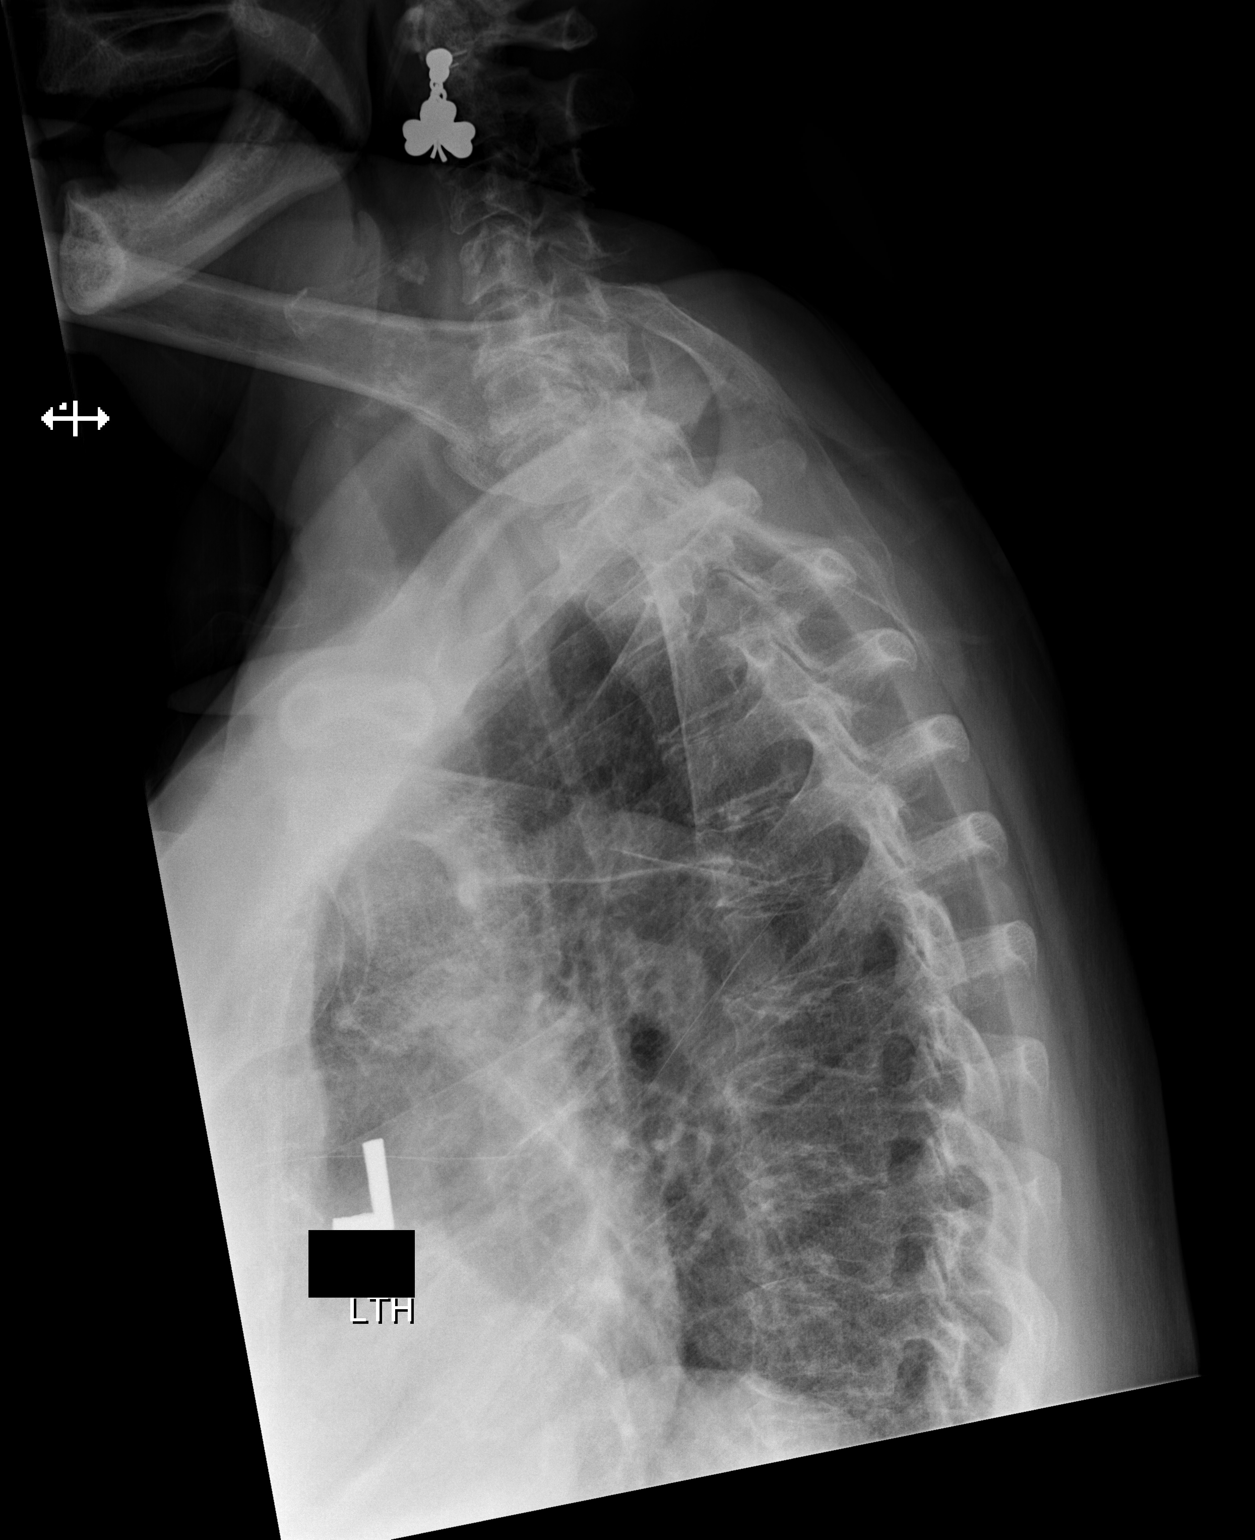

[3 of 3 positions shown; findings below may reference images not displayed]

FINDINGS: There is no evidence of thoracic spine fracture. There is
dextroscoliosis of the thoracolumbar spine. Mild multilevel
degenerative disc disease changes are appreciated within the upper
and mid thoracic spine.
IMPRESSION: No evidence of acute osseous abnormalities.  Multilevel spondylosis.

## 2014-10-22 NOTE — H&P (Signed)
PATIENT NAME:  Diana Mccoy, Diana M MR#:  [id number] DATE OF BIRTH:  02/04/1946  DATE OF ADMISSION:  01/04/2013  PRIMARY CARE PHYSICIAN: Dr. Lorie PhenixNancy Mccoy.   CHIEF COMPLAINT: Hypotension.   HISTORY OF PRESENT ILLNESS: This is a 69 year old female who presents to the hospital from [facility] due to her accidentally being given someone else's medications. The patient was accidentally given multiple medications that she is not on, including amiodarone, aspirin, enalapril, Imdur, Pravachol, hydralazine and also torsemide. She was being observed here in the ER, although she became somewhat hypotensive, with blood pressure in the 80s. She is, although, clinically asymptomatic. She has responded well to some gentle IV fluid bolus, but hospitalist services were contacted for further treatment and evaluation and for observation.   The patient presently denies any chest pain, shortness of breath, any palpitations or any dizziness, lightheadedness, any nausea, vomiting, abdominal pain, or any other associated symptoms presently.   REVIEW OF SYSTEMS:  CONSTITUTIONAL: No documented fever. No weight gain, no weight loss.  EYES: No blurred or double vision.  ENT: No tinnitus. No postnasal drip. No redness of the oropharynx.  RESPIRATORY: No cough, no wheeze, no hemoptysis, no dyspnea.  CARDIOVASCULAR: No chest pain, no orthopnea, no palpitations, no syncope.  GASTROINTESTINAL: No nausea, no vomiting, diarrhea, no abdominal pain, no melena or hematochezia.  GENITOURINARY: No dysuria, no hematuria.  ENDOCRINE: No polyuria or nocturia. No cold intolerance.  HEMATOLOGIC: No anemia, no bruising, no bleeding.  INTEGUMENTARY: No rashes. No lesions.  MUSCULOSKELETAL: No arthritis, no swelling, no gout.  NEUROLOGIC: No numbness or tingling. No ataxia. No seizure activity.  PSYCHIATRIC: No anxiety, no insomnia. No ADD.   PAST MEDICAL HISTORY: Is consistent with hypertension, schizophrenia,  anxiety/depression, GERD, osteoarthritis.   ALLERGIES: PENICILLIN, which causes a rash; SODIUM PENTOTHAL, and CIPRO.   SOCIAL HISTORY: Does smoke about a pack per day. Has not smoked for the past 50 years. No alcohol abuse. No illicit drug abuse. Res[facility] at [facility].   FAMILY HISTORY: Father died from natural causes. Mother died from complications of heart disease, but she was [id number].   CURRENT MEDICATIONS: Are as follows: Advair 250/50, 1 puff b.i.d., calcium/vitamin D,  1 tablet b.i.d., iron sulfate 325 mg b.i.d., glycopyrrolate 2 mg t.i.d., hydroxyzine 50 mg at bedtime, Klonopin 0.5 mg t.i.d., meloxicam 7.5 mg b.i.d., metoprolol tartrate 25 mg b.i.d., Prilosec 20 mg b.i.d., Risperdal 4 mg daily, trazodone 100 mg, 2 tablets at bedtime, Wellbutrin  SR 150 mg b.i.d., Zonegran 100 mg, two capsules at bedtime.   PHYSICAL EXAMINATION: On admission is as follows: Patient's vital signs are noted to be: Temperature is 97.7, pulse 72, respirations 18, blood pressure 108/53, sats 98% on room air.  GENERAL: She is a pleasant-appearing female in no apparent distress.  HEENT: Atraumatic, normocephalic. Extraocular muscles are intact. Pupils are equal and reactive to light. Sclerae anicteric. No conjunctival injection. No pharyngeal erythema.  NECK: Supple. There is no jugular venous distention, no bruits, no lymphadenopathy, no thyromegaly.  HEART EXAM: Regular rate and rhythm. No murmurs, no rubs, no clicks.  LUNGS: Clear to auscultation bilaterally. No rales or rhonchi. No wheezes.  ABDOMEN: Soft, flat, nontender, nondistended. Has good bowel sounds. No hepatosplenomegaly appreciated.  EXTREMITIES: No evidence of any cyanosis, clubbing, or peripheral edema. Has +2 pedal and radial pulses bilaterally.  NEUROLOGIC: The patient is alert, awake, and oriented x 3, with no focal motor or sensory deficits appreciated bilaterally.  SKIN: Moist and warm, with no rashes appreciated.  LYMPHATIC: There  is no cervical lymphadenopathy.   LABORATORY EXAM: Showed a serum glucose 129, BUN 17, creatinine 1.1, sodium 137, potassium is currently pending. Chloride 105, bicarb 23. The patient's LFTs are within normal limits. White cell count 10.3, hemoglobin 13.5, hematocrit 41.1, platelet count 251.   100.   ASSESSMENT AND PLAN: This is a 69 year old female with a history of hypertension, schizophrenia, depression, anxiety, GERD, osteoarthritis who presents to the hospital. She was accidentally given someone else's medications at her assisted living. The patient was then noted to be hypotensive as she received multiple antihypertensives.   1.  Hypotension: This was likely secondary to accidental intake of somebody else's antihypertensives. The patient apparently just takes metoprolol for blood pressure, but was given, along with metoprolol, amiodarone, Imdur, enalapril, hydralazine and also torsemide. She was noted to be hypotensive with systolic blood pressures in the 80s, although she is asymptomatic. She has responded well to IV fluids, therefore I will give her gentle intravenous fluids for now,  observe her overnight, follow hemodynamics. Hold all of her antihypertensives for now. There is no evidence of septic or cardiogenic shock.  2.  Schizophrenia. I will continue her Risperdal.  3.  Anxiety/depression: Continue with her Klonopin, Wellbutrin and her trazodone.  4.  History of chronic obstructive pulmonary disease with ongoing tobacco abuse. There is no evidence of an acute chronic obstructive pulmonary disease exacerbation. I will continue her Advair.  5.  Gastroesophageal reflux disease: Continue with her Prilosec.   CODE STATUS: THE PATIENT IS A FULL CODE.   Time spent is forty-five minutes     ____________________________ Rolly Pancake. Cherlynn Kaiser, MD vjs:dm D: 01/04/2013 12:06:30 ET T: 01/04/2013 12:42:33 ET JOB#: [id number]  cc: Rolly Pancake. Cherlynn Kaiser, MD, <Dictator> Houston Siren  MD ELECTRONICALLY SIGNED 01/04/2013 16:46

## 2014-10-22 NOTE — Discharge Summary (Signed)
PATIENT NAME:  Diana Mccoy, Diana Mccoy MR#:  [id number] DATE OF BIRTH:  1946-03-02  DATE OF ADMISSION:  01/04/2013 DATE OF DISCHARGE:  01/05/2013  ADMITTING DIAGNOSIS:  Hypotension.   DISCHARGE DIAGNOSES: 1.  Hypotension due to patient receiving multiple medications that she was not on, including amiodarone, enalapril, Imdur, Pravachol, hydralazine and torsemide.  2.  History of hypertension.  3.  Schizophrenia.  4.  Anxiety, depression.  5.  Gastroesophageal reflux disease.  6.  Osteoarthritis.  PERTINENT LABORATORIES AND EVALUATIONS:  Glucose was 129, BUN 17, creatinine 1.13, sodium 137, potassium 4.1, chloride 105. CO2 is 23, calcium 9.5. LFTs: Total protein 7.0, albumin 3.4, bili total 0.3, alk phos was 118. AST was 15, ALT 21, WBC 10.3, hemoglobin 13.5. Platelet count was 251.  EKG normal sinus rhythm without any ST-T wave changes, possible left atrial enlargement. Heart rate was 77.  Chest x-ray showed no acute cardiopulmonary processes.   HOSPITAL COURSE:  Please refer to H and P done by the admitting physician. The patient is a 69 year old white female who currently resides in [place] who was in her normal state of health who accidentally was given multiple medications as stated above. They were due for another patient. The patient's blood pressure subsequently dropped due to receiving enalapril, Imdur, hydralazine and torsemide. Her blood pressure was in the 80s. The patient was asymptomatic but did receive a fluid bolus and due to receiving all these medications, she was placed under observation. The patient continued to be monitored in the hospital. This morning blood pressure was 106/64. The patient is currently close to her baseline. The patient's daughter was at bedside and she stated that her mom is doing okay and is currently stable, so she is being transferred back to the assisted living.   DISCHARGE MEDICATIONS: 1.  Wellbutrin  SR 150 one tab p.o. b.i.d. 2.   Hydroxyzine 50 one tab p.o. at bedtime. 3.  Iron sulfate 325 one tab p.o. b.i.d. 4.  Advair 250/50 one puff b.i.d. 5.  Meloxicam 7.5 one tab p.o. b.i.d. 6.  Glycopyrrolate 2 mg 1 tab p.o. t.i.d. 7.  Trazodone 200 mg at bedtime. 8.  Calcium plus vitamin D 1 tab p.o. t.i.d. 9.  Tylenol 650 q. 8 p.r.n.  10.  Norco 325/5 one tab q. 8 p.r.n. 11.  Hydroxyzine 50 one tab p.o. daily as needed. 12.  Klonopin 0.5 one tab p.o. t.i.d. 13.  ProAir 2 puffs q. 4 p.r.n.  14.  Zonisamide 100 mg 2 caps at bedtime. 15.  Fosamax 70 mg q. weekly on Wednesday.  16.  Vitamin D2 50,000 international units once a month. 17.  Claritin 10 daily. 18.  Nexium 40 daily. 19.  Macrodantin 100 daily. 20.  Risperdal 4 mg daily.   DIET:  Low sodium.   ACTIVITY:  As tolerated.  Follow up with primary M.D.  The patient was previously on metoprolol; that has been stopped due to her current hypotension. This will likely need to be resumed in a week or so.  Note:  32 minutes spent on the discharge.     ____________________________ Lafonda Mosses. Posey Pronto, MD shp:ce D: 01/06/2013 08:42:49 ET T: 01/06/2013 10:51:09 ET JOB#: [id number]  cc: Addylynn Balin H. Posey Pronto, MD, <Dictator> Alric Seton MD ELECTRONICALLY SIGNED 01/13/2013 10:57

## 2014-11-03 DIAGNOSIS — F200 Paranoid schizophrenia: Secondary | ICD-10-CM | POA: Diagnosis not present

## 2014-12-28 DIAGNOSIS — L650 Telogen effluvium: Secondary | ICD-10-CM | POA: Insufficient documentation

## 2014-12-28 DIAGNOSIS — F419 Anxiety disorder, unspecified: Secondary | ICD-10-CM | POA: Insufficient documentation

## 2014-12-28 DIAGNOSIS — M4800 Spinal stenosis, site unspecified: Secondary | ICD-10-CM | POA: Insufficient documentation

## 2014-12-28 DIAGNOSIS — M419 Scoliosis, unspecified: Secondary | ICD-10-CM

## 2014-12-28 DIAGNOSIS — J449 Chronic obstructive pulmonary disease, unspecified: Secondary | ICD-10-CM

## 2014-12-28 DIAGNOSIS — N289 Disorder of kidney and ureter, unspecified: Secondary | ICD-10-CM | POA: Insufficient documentation

## 2014-12-28 DIAGNOSIS — L638 Other alopecia areata: Secondary | ICD-10-CM

## 2014-12-28 DIAGNOSIS — L639 Alopecia areata, unspecified: Secondary | ICD-10-CM | POA: Insufficient documentation

## 2014-12-28 DIAGNOSIS — M1990 Unspecified osteoarthritis, unspecified site: Secondary | ICD-10-CM | POA: Insufficient documentation

## 2014-12-28 DIAGNOSIS — I10 Essential (primary) hypertension: Secondary | ICD-10-CM

## 2014-12-28 DIAGNOSIS — F209 Schizophrenia, unspecified: Secondary | ICD-10-CM | POA: Insufficient documentation

## 2014-12-28 DIAGNOSIS — F319 Bipolar disorder, unspecified: Secondary | ICD-10-CM

## 2014-12-28 DIAGNOSIS — K219 Gastro-esophageal reflux disease without esophagitis: Secondary | ICD-10-CM | POA: Insufficient documentation

## 2014-12-28 HISTORY — DX: Spinal stenosis, site unspecified: M48.00

## 2014-12-28 HISTORY — DX: Chronic obstructive pulmonary disease, unspecified: J44.9

## 2014-12-28 HISTORY — DX: Other alopecia areata: L63.8

## 2014-12-28 HISTORY — DX: Disorder of kidney and ureter, unspecified: N28.9

## 2014-12-28 HISTORY — DX: Gastro-esophageal reflux disease without esophagitis: K21.9

## 2014-12-28 HISTORY — DX: Bipolar disorder, unspecified: F31.9

## 2014-12-28 HISTORY — DX: Anxiety disorder, unspecified: F41.9

## 2014-12-28 HISTORY — DX: Scoliosis, unspecified: M41.9

## 2014-12-28 HISTORY — DX: Essential (primary) hypertension: I10

## 2014-12-29 ENCOUNTER — Ambulatory Visit (INDEPENDENT_AMBULATORY_CARE_PROVIDER_SITE_OTHER): Payer: Medicare Other | Admitting: Family Medicine

## 2014-12-29 ENCOUNTER — Encounter: Payer: Self-pay | Admitting: Family Medicine

## 2014-12-29 VITALS — BP 110/70 | HR 100 | Temp 98.4°F | Resp 16 | Ht 65.0 in

## 2014-12-29 DIAGNOSIS — L0100 Impetigo, unspecified: Secondary | ICD-10-CM

## 2014-12-29 DIAGNOSIS — N3000 Acute cystitis without hematuria: Secondary | ICD-10-CM | POA: Diagnosis not present

## 2014-12-29 MED ORDER — CEFDINIR 300 MG PO CAPS: 300.0000 mg | ORAL_CAPSULE | Freq: Two times a day (BID) | ORAL | Status: DC

## 2014-12-29 MED ORDER — DOXYCYCLINE HYCLATE 100 MG PO TABS: 100.0000 mg | ORAL_TABLET | Freq: Two times a day (BID) | ORAL | Status: DC

## 2014-12-29 NOTE — Progress Notes (Signed)
Subjective:    Patient ID: Diana Mccoy, female    DOB: Dec 06, 1945, 69 y.o.   MRN: [id number]  Hand Pain  The incident occurred more than 1 week ago (3 weeks ago). There was no injury mechanism. The pain is present in the left hand. The quality of the pain is described as burning and aching (itches). The pain does not radiate. The pain is at a severity of 5/10. The pain is mild. Associated symptoms include numbness. Pertinent negatives include no chest pain, muscle weakness or tingling. Treatments tried: Neosporin. The treatment provided no relief.   Also concerned about UTI.  Would like to have her urine checked.  Has history of recurrent UTIs.      Review of Systems  Cardiovascular: Negative for chest pain.  Genitourinary: Positive for frequency.  Skin: Positive for rash. Negative for color change, pallor and wound.  Neurological: Positive for numbness. Negative for tingling.   BP 110/70 mmHg  Pulse 100  Temp(Src) 98.4 F (36.9 C) (Oral)  Resp 16  Ht  (1.651 m)  Wt    Patient Active Problem List   Diagnosis Date Noted  . Anxiety 12/28/2014  . 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 CAFL (chronic airflow limitation) 12/28/2014  . 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 Acid reflux 12/28/2014  . BP (high blood pressure) 12/28/2014  . Disorder of kidney 12/28/2014  . Arthritis, degenerative 12/28/2014  . 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 Scoliosis 12/28/2014  . Spinal stenosis 12/28/2014  . 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 Current Outpatient Prescriptions on File Prior to Visit  Medication Sig  . acetaminophen (TYLENOL 8 HOUR ARTHRITIS PAIN) 650 MG CR tablet Take by mouth.  Marland Kitchen albuterol (VENTOLIN HFA) 108 (90 BASE) MCG/ACT inhaler Inhale into the lungs.  Marland Kitchen alendronate (FOSAMAX) 70 MG tablet Take by mouth.  Marland Kitchen buPROPion (WELLBUTRIN SR) 150 MG 12 hr tablet Take by mouth.  . Calcium Carb-Cholecalciferol (OYSTER SHELL CALCIUM PLUS D) 500-200  MG-UNIT TABS Take by mouth.  . clonazePAM (KLONOPIN) 0.5 MG tablet Take by mouth.  . esomeprazole (NEXIUM) 40 MG capsule Take by mouth.  . estradiol (ESTRACE VAGINAL) 0.1 MG/GM vaginal cream Place vaginally.  . ferrous sulfate 325 (65 FE) MG tablet Take by mouth.  Marland Kitchen glycopyrrolate (ROBINUL) 2 MG tablet Take by mouth.  Marland Kitchen HYDROcodone-acetaminophen (NORCO/VICODIN) 5-325 MG per tablet Take by mouth.  . loratadine (CLARITIN) 10 MG tablet Take by mouth.  . Magnesium Hydroxide (MILK OF MAGNESIA CONCENTRATE) 2400 MG/10ML SUSP Take by mouth.  . nystatin ointment (MYCOSTATIN) NYSTATIN, 100000 UNIT/GM (External Ointment)  1 Ointment Ointment Apply to affected area twice a day for 0 days  Quantity: 60;  Refills: 0   Ordered :14-Aug-2013  Lorie Phenix MD;  Started 14-Aug-2013 Active  . risperidone (RISPERDAL) 4 MG tablet Take by mouth.  . traZODone (DESYREL) 100 MG tablet Take by mouth.  . triamcinolone (KENALOG) 0.025 % cream TRIAMCINOLONE ACETONIDE, 0.025% (External Cream)  1 (one) Cream Cream Apply to affected area twice a day for 0 days  Quantity: 30;  Refills: 0   Ordered :17-May-2014  Kavin Leech ;  Started 17-May-2014 Active  . trimethoprim (TRIMPEX) 100 MG tablet Take by mouth.  . Vitamin D, Ergocalciferol, (DRISDOL) 50000 UNITS CAPS capsule Take by mouth.  . meloxicam (MOBIC) 7.5 MG tablet Take by mouth.  . tiotropium (SPIRIVA HANDIHALER) 18 MCG inhalation capsule Place into inhaler and inhale.  . zonisamide (ZONEGRAN) 100 MG capsule Take by  mouth.   No current facility-administered medications on file prior to visit.   Allergies  Allergen Reactions  . Ciprofloxacin Nausea And Vomiting  . Penicillins     rash, hives   Past Surgical History  Procedure Laterality Date  . Hip fracture surgery Left    History   Social History  . Marital Status: Divorced    Spouse Name: N/A  . Number of Children: N/A  . Years of Education: N/A   Occupational History  . Not on file.    Social History Main Topics  . Smoking status: Current Every Day Smoker -- 1.00 packs/day for 30 years  . Smokeless tobacco: Never Used  . Alcohol Use: No  . Drug Use: No  . Sexual Activity: Not on file   Other Topics Concern  . Not on file   Social History Narrative   Family History  Problem Relation Age of Onset  . Gout Father       Objective:   Physical Exam  Constitutional: She is oriented to person, place, and time. She appears well-developed and well-nourished.  Neurological: She is alert and oriented to person, place, and time.  Skin: Lesion and rash (two lesions on dorsal surface of left hand.  Sharp borders, very erythematous and weepy,  Some crusty areas.  Lesions are circular and over 2 cm in diameter. ) noted. There is erythema.  Psychiatric: She has a normal mood and affect. Her behavior is normal.          Assessment & Plan:   1. Impetigo Suspected diagnosis. Will treat and reassess if does not improve.  Please call back if condition worsens or does not continue to improve.    - cefdinir (OMNICEF) 300 MG capsule; Take 1 capsule (300 mg total) by mouth 2 (two) times daily.  Dispense: 20 capsule; Refill: o  2. Acute cystitis without hematuria Will send for culture. Treat with same antibiotic. Has PCN allergy but has had Rocephin in the past.  Please call back if condition worsens or does not continue to improve.   Will adjust if needed depending on culture results.  - Urine Culture - cefdinir (OMNICEF) 300 MG capsule; Take 1 capsule (300 mg total) by mouth 2 (two) times daily.  Dispense: 20 capsule; Refill: o  Lorie PhenixNancy Monterius Rolf, MD

## 2014-12-31 ENCOUNTER — Telehealth: Payer: Self-pay

## 2014-12-31 LAB — URINE CULTURE

## 2014-12-31 NOTE — Telephone Encounter (Signed)
[facility], advised.  Pt is feeling better.   Thanks,   -Vernona RiegerLaura

## 2014-12-31 NOTE — Telephone Encounter (Signed)
-----   Message from Lorie PhenixNancy Maloney, MD sent at 12/31/2014 11:41 AM EDT ----- Urine with infection. Sensitive to antibiotic put on. Please see how patient is doing. Thanks.

## 2015-01-05 DIAGNOSIS — F209 Schizophrenia, unspecified: Secondary | ICD-10-CM | POA: Diagnosis not present

## 2015-01-05 DIAGNOSIS — F418 Other specified anxiety disorders: Secondary | ICD-10-CM | POA: Diagnosis not present

## 2015-01-11 ENCOUNTER — Ambulatory Visit: Payer: Medicare Other | Admitting: Family Medicine

## 2015-01-13 ENCOUNTER — Encounter: Payer: Self-pay | Admitting: Family Medicine

## 2015-01-13 ENCOUNTER — Ambulatory Visit (INDEPENDENT_AMBULATORY_CARE_PROVIDER_SITE_OTHER): Payer: Medicare Other | Admitting: Family Medicine

## 2015-01-13 VITALS — BP 106/56 | HR 110 | Temp 98.3°F | Resp 18

## 2015-01-13 DIAGNOSIS — R21 Rash and other nonspecific skin eruption: Secondary | ICD-10-CM

## 2015-01-13 DIAGNOSIS — L309 Dermatitis, unspecified: Secondary | ICD-10-CM | POA: Diagnosis not present

## 2015-01-13 MED ORDER — MOMETASONE FUROATE 0.1 % EX CREA: 1.0000 "application " | TOPICAL_CREAM | Freq: Every day | CUTANEOUS | Status: DC

## 2015-01-13 NOTE — Progress Notes (Signed)
Subjective:    Patient ID: Diana Mccoy, female    DOB: [birth year], 69 y.o.   MRN: [id number]  Impetigo This is a recurrent problem. The current episode started 1 to 4 weeks ago. The problem has been gradually worsening since onset. The affected locations include the right hand and left hand. The rash is characterized by blistering, itchiness and pain. Associated symptoms include congestion, coughing, diarrhea, eye pain, fatigue, joint pain, shortness of breath and vomiting. Pertinent negatives include no anorexia, facial edema, fever, nail changes, rhinorrhea or sore throat. Past treatments include antibiotics. The treatment provided mild relief.      Review of Systems  Constitutional: Positive for fatigue. Negative for fever.  HENT: Positive for congestion. Negative for rhinorrhea and sore throat.   Eyes: Positive for pain.  Respiratory: Positive for cough and shortness of breath.   Gastrointestinal: Positive for vomiting and diarrhea. Negative for anorexia.  Musculoskeletal: Positive for joint pain.  Skin: Negative for nail changes.   BP 106/56 mmHg  Pulse 110  Temp(Src) 98.3 F (36.8 C) (Oral)  Resp 18  Wt    Patient Active Problem List   Diagnosis Date Noted  . Anxiety 12/28/2014  . 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 CAFL (chronic airflow limitation) 12/28/2014  . 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 Acid reflux 12/28/2014  . BP (high blood pressure) 12/28/2014  . Disorder of kidney 12/28/2014  . Arthritis, degenerative 12/28/2014  . 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 Scoliosis 12/28/2014  . Spinal stenosis 12/28/2014  . 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 No pertinent past medical history. Current Outpatient Prescriptions on File Prior to Visit  Medication Sig  . acetaminophen (TYLENOL 8 HOUR ARTHRITIS PAIN) 650 MG CR tablet Take by mouth.  Marland Kitchen albuterol (VENTOLIN HFA) 108 (90 BASE) MCG/ACT inhaler Inhale into the lungs.  Marland Kitchen alendronate (FOSAMAX) 70 MG  tablet Take by mouth.  Marland Kitchen buPROPion (WELLBUTRIN SR) 150 MG 12 hr tablet Take by mouth.  . Calcium Carb-Cholecalciferol (OYSTER SHELL CALCIUM PLUS D) 500-200 MG-UNIT TABS Take by mouth.  . cefdinir (OMNICEF) 300 MG capsule Take 1 capsule (300 mg total) by mouth 2 (two) times daily.  . clonazePAM (KLONOPIN) 0.5 MG tablet Take by mouth.  . doxycycline (VIBRA-TABS) 100 MG tablet Take 1 tablet (100 mg total) by mouth 2 (two) times daily.  Marland Kitchen esomeprazole (NEXIUM) 40 MG capsule Take by mouth.  . estradiol (ESTRACE VAGINAL) 0.1 MG/GM vaginal cream Place vaginally.  . ferrous sulfate 325 (65 FE) MG tablet Take by mouth.  Marland Kitchen glycopyrrolate (ROBINUL) 2 MG tablet Take by mouth.  Marland Kitchen HYDROcodone-acetaminophen (NORCO/VICODIN) 5-325 MG per tablet Take by mouth.  . loratadine (CLARITIN) 10 MG tablet Take by mouth.  . Magnesium Hydroxide (MILK OF MAGNESIA CONCENTRATE) 2400 MG/10ML SUSP Take by mouth.  . meloxicam (MOBIC) 7.5 MG tablet Take by mouth.  . nystatin ointment (MYCOSTATIN) NYSTATIN, 100000 UNIT/GM (External Ointment)  1 Ointment Ointment Apply to affected area twice a day for 0 days  Quantity: 60;  Refills: 0   Ordered :14-Aug-2013  Lorie Phenix MD;  Started 14-Aug-2013 Active  . risperidone (RISPERDAL) 4 MG tablet Take by mouth.  . tiotropium (SPIRIVA HANDIHALER) 18 MCG inhalation capsule Place into inhaler and inhale.  . traZODone (DESYREL) 100 MG tablet Take by mouth.  . triamcinolone (KENALOG) 0.025 % cream TRIAMCINOLONE ACETONIDE, 0.025% (External Cream)  1 (one) Cream Cream Apply to affected area twice a day for 0 days  Quantity: 30;  Refills: 0  Ordered :17-May-2014  Kavin LeechWalsh, Laura ;  Started 17-May-2014 Active  . trimethoprim (TRIMPEX) 100 MG tablet Take by mouth.  . Vitamin D, Ergocalciferol, (DRISDOL) 50000 UNITS CAPS capsule Take by mouth.  . zonisamide (ZONEGRAN) 100 MG capsule Take by mouth.   No current facility-administered medications on file prior to visit.   Allergies   Allergen Reactions  . Ciprofloxacin Nausea And Vomiting  . Penicillins     rash, hives   Past Surgical History  Procedure Laterality Date  . Hip fracture surgery Left    History   Social History  . Marital Status: Divorced    Spouse Name: N/A  . Number of Children: N/A  . Years of Education: N/A   Occupational History  . Not on file.   Social History Main Topics  . Smoking status: Current Every Day Smoker -- 1.00 packs/day for 30 years  . Smokeless tobacco: Never Used  . Alcohol Use: No  . Drug Use: No  . Sexual Activity: Not on file   Other Topics Concern  . Not on file   Social History Narrative   Family History  Problem Relation Age of Onset  . Gout Father        Objective:   Physical Exam  Constitutional: She is oriented to person, place, and time. She appears well-developed and well-nourished.  Neurological: She is alert and oriented to person, place, and time.  Skin: Rash noted. Rash is macular, pustular and urticarial.  Scaly rash, with some sharpness at borders, some blisters, some collapsed and now hyperpigmented brown lesions.  Only on hands.    BP 106/56 mmHg  Pulse 110  Temp(Src) 98.3 F (36.8 C) (Oral)  Resp 18  Wt       Assessment & Plan:  1. Skin rash Will treat as if eczema. Will check labs and then reassess.  - CBC with Differential/Platelet - Comprehensive metabolic panel - [facility]  2. Eczema of both hands Will treat with steroid cream and further plan pending these results.   Lorie PhenixNancy Sharona Rovner, MD

## 2015-01-14 ENCOUNTER — Telehealth: Payer: Self-pay

## 2015-01-14 DIAGNOSIS — N3000 Acute cystitis without hematuria: Secondary | ICD-10-CM

## 2015-01-14 DIAGNOSIS — L0100 Impetigo, unspecified: Secondary | ICD-10-CM

## 2015-01-14 LAB — COMPREHENSIVE METABOLIC PANEL
A/G RATIO: 1.7 (ref 1.1–2.5)
ALK PHOS: 83 IU/L (ref 39–117)
ALT: 9 IU/L (ref 0–32)
AST: 15 IU/L (ref 0–40)
Albumin: 4 g/dL (ref 3.5–4.8)
BILIRUBIN TOTAL: 0.3 mg/dL (ref 0.0–1.2)
BUN/Creatinine Ratio: 14 (ref 11–26)
BUN: 15 mg/dL (ref 8–27)
CO2: 22 mmol/L (ref 18–29)
Calcium: 9.7 mg/dL (ref 8.7–10.3)
Chloride: 98 mmol/L (ref 97–108)
Creatinine, Ser: 1.04 mg/dL — ABNORMAL HIGH (ref 0.57–1.00)
GFR calc non Af Amer: 54 mL/min/{1.73_m2} — ABNORMAL LOW (ref >59)
GFR, EST AFRICAN AMERICAN: 62 mL/min/{1.73_m2} (ref >59)
Globulin, Total: 2.3 g/dL (ref 1.5–4.5)
Glucose: 94 mg/dL (ref 65–99)
Potassium: 5.4 mmol/L — ABNORMAL HIGH (ref 3.5–5.2)
Sodium: 136 mmol/L (ref 134–144)
Total Protein: 6.3 g/dL (ref 6.0–8.5)

## 2015-01-14 LAB — CBC WITH DIFFERENTIAL/PLATELET
Basophils Absolute: 0 10*3/uL (ref 0.0–0.2)
Basos: 0 %
EOS (ABSOLUTE): 0.1 10*3/uL (ref 0.0–0.4)
Eos: 0 %
Hematocrit: 41.7 % (ref 34.0–46.6)
Hemoglobin: 14 g/dL (ref 11.1–15.9)
Immature Grans (Abs): 0 10*3/uL (ref 0.0–0.1)
Immature Granulocytes: 0 %
LYMPHS: 11 %
Lymphocytes Absolute: 2.2 10*3/uL (ref 0.7–3.1)
MCH: 33.7 pg — ABNORMAL HIGH (ref 26.6–33.0)
MCHC: 33.6 g/dL (ref 31.5–35.7)
MCV: 101 fL — AB (ref 79–97)
MONOCYTES: 7 %
Monocytes Absolute: 1.3 10*3/uL — ABNORMAL HIGH (ref 0.1–0.9)
Neutrophils Absolute: 16.3 10*3/uL — ABNORMAL HIGH (ref 1.4–7.0)
Neutrophils: 82 %
PLATELETS: 297 10*3/uL (ref 150–379)
RBC: 4.15 x10E6/uL (ref 3.77–5.28)
RDW: 13.7 % (ref 12.3–15.4)
WBC: 20 10*3/uL (ref 3.4–10.8)

## 2015-01-14 LAB — RPR: RPR Ser Ql: NONREACTIVE

## 2015-01-14 NOTE — Telephone Encounter (Signed)
-----   Message from Lorie PhenixNancy Maloney, MD sent at 01/14/2015  8:36 AM EDT ----- Please call and see if Diana Mccoy is running any fevers and acting ok.  If ok, repeat CBC on Monday.   If not, need follow up ov, repeat urine, etc today.  Thanks.

## 2015-01-14 NOTE — Telephone Encounter (Signed)
LMTCB 01/14/2015  Thanks,   -Julane Crock  

## 2015-01-15 MED ORDER — CEFDINIR 300 MG PO CAPS: 300.0000 mg | ORAL_CAPSULE | Freq: Two times a day (BID) | ORAL | Status: DC

## 2015-01-15 NOTE — Telephone Encounter (Signed)
Still unable to reach facility re: patient. [facility] with [facility] They can send medication and order to facility tonight. Will restart Omnicef as helped her hands before. Unclear why white count elevated. PLease call facility again today and see how patient is doing. Thanks.

## 2015-01-17 NOTE — Telephone Encounter (Signed)
Left message with Vernona RiegerLaura, to [facility]    Thanks,   -Vernona RiegerLaura

## 2015-01-17 NOTE — Telephone Encounter (Signed)
Spoke with Jody at facility, states pt is doing much better. [name], CMA

## 2015-01-26 DIAGNOSIS — F259 Schizoaffective disorder, unspecified: Secondary | ICD-10-CM | POA: Diagnosis not present

## 2015-01-27 ENCOUNTER — Ambulatory Visit: Payer: Medicare Other | Admitting: Family Medicine

## 2015-01-31 ENCOUNTER — Ambulatory Visit (INDEPENDENT_AMBULATORY_CARE_PROVIDER_SITE_OTHER): Payer: Medicare Other | Admitting: Family Medicine

## 2015-01-31 ENCOUNTER — Telehealth: Payer: Self-pay | Admitting: Family Medicine

## 2015-01-31 ENCOUNTER — Encounter: Payer: Self-pay | Admitting: Family Medicine

## 2015-01-31 VITALS — BP 118/68 | HR 92 | Temp 98.2°F | Resp 16

## 2015-01-31 DIAGNOSIS — L309 Dermatitis, unspecified: Secondary | ICD-10-CM | POA: Diagnosis not present

## 2015-01-31 DIAGNOSIS — D72829 Elevated white blood cell count, unspecified: Secondary | ICD-10-CM | POA: Insufficient documentation

## 2015-01-31 DIAGNOSIS — R21 Rash and other nonspecific skin eruption: Secondary | ICD-10-CM

## 2015-01-31 MED ORDER — TRIAMCINOLONE ACETONIDE 0.025 % EX CREA
TOPICAL_CREAM | Freq: Every day | CUTANEOUS | Status: DC
Start: 2015-01-31 — End: 2015-03-28

## 2015-01-31 NOTE — Progress Notes (Signed)
Patient ID: Diana Mccoy, female   DOB: Illianna 05, 1947, 69 y.o.   MRN: [id number]        Patient: Diana Mccoy Female    DOB: 29-Feb-1946   69 y.o.   MRN: [id number] Visit Date: 01/31/2015  Today's Provider: Lorie Phenix, MD   Chief Complaint  Patient presents with  . Rash   Subjective:    Rash This is a new problem. The affected locations include the right hand and left hand. The rash is characterized by dryness, peeling and pain. Associated symptoms include congestion, coughing, rhinorrhea and vomiting (Pt reports vomiting her pills out last night; but feels much better this morning. ). Pertinent negatives include no diarrhea, fatigue, fever, shortness of breath or sore throat. Past treatments include topical steroids. The treatment provided moderate (Improved greatly, but not completely gone.  ) relief.   Also had elevated white blood cell count. No source known.  Was put on another round of antibiotic.  Feels fine.     Allergies  Allergen Reactions  . Ciprofloxacin Nausea And Vomiting  . Penicillins     rash, hives   Previous Medications   ACETAMINOPHEN (TYLENOL 8 HOUR ARTHRITIS PAIN) 650 MG CR TABLET    Take by mouth.   ALBUTEROL (VENTOLIN HFA) 108 (90 BASE) MCG/ACT INHALER    Inhale into the lungs.   ALENDRONATE (FOSAMAX) 70 MG TABLET    Take by mouth.   BUPROPION (WELLBUTRIN SR) 150 MG 12 HR TABLET    Take by mouth.   CALCIUM CARB-CHOLECALCIFEROL (OYSTER SHELL CALCIUM PLUS D) 500-200 MG-UNIT TABS    Take by mouth.   CEFDINIR (OMNICEF) 300 MG CAPSULE    Take 1 capsule (300 mg total) by mouth 2 (two) times daily.   CEFDINIR (OMNICEF) 300 MG CAPSULE    Take 1 capsule (300 mg total) by mouth 2 (two) times daily.   CLONAZEPAM (KLONOPIN) 0.5 MG TABLET    Take by mouth.   DOXYCYCLINE (VIBRA-TABS) 100 MG TABLET    Take 1 tablet (100 mg total) by mouth 2 (two) times daily.   ESOMEPRAZOLE (NEXIUM) 40 MG CAPSULE    Take by mouth.   ESTRADIOL (ESTRACE VAGINAL) 0.1 MG/GM VAGINAL CREAM    Place  vaginally.   FERROUS SULFATE 325 (65 FE) MG TABLET    Take by mouth.   GLYCOPYRROLATE (ROBINUL) 2 MG TABLET    Take by mouth.   HYDROCODONE-ACETAMINOPHEN (NORCO/VICODIN) 5-325 MG PER TABLET    Take by mouth.   LORATADINE (CLARITIN) 10 MG TABLET    Take by mouth.   MAGNESIUM HYDROXIDE (MILK OF MAGNESIA CONCENTRATE) 2400 MG/10ML SUSP    Take by mouth.   MELOXICAM (MOBIC) 7.5 MG TABLET    Take by mouth.   MOMETASONE (ELOCON) 0.1 % CREAM    Apply 1 application topically daily. To hands every evening and then cover with cotton gloves.   NYSTATIN OINTMENT (MYCOSTATIN)    NYSTATIN, 100000 UNIT/GM (External Ointment)  1 Ointment Ointment Apply to affected area twice a day for 0 days  Quantity: 60;  Refills: 0   Ordered :14-Aug-2013  Lorie Phenix MD;  Started 14-Aug-2013 Active   PALIPERIDONE (INVEGA SUSTENNA) 156 MG/ML SUSP INJECTION    Inject 156 mg into the muscle every 30 (thirty) days.   PALIPERIDONE (INVEGA) 6 MG 24 HR TABLET    Take 6 mg by mouth at bedtime.   RISPERIDONE (RISPERDAL) 4 MG TABLET    Take by mouth.   TIOTROPIUM (SPIRIVA HANDIHALER) 18 MCG  INHALATION CAPSULE    Place into inhaler and inhale.   TRAZODONE (DESYREL) 100 MG TABLET    Take by mouth.   TRIAMCINOLONE (KENALOG) 0.025 % CREAM    TRIAMCINOLONE ACETONIDE, 0.025% (External Cream)  1 (one) Cream Cream Apply to affected area twice a day for 0 days  Quantity: 30;  Refills: 0   Ordered :17-May-2014  Kavin Leech ;  Started 17-May-2014 Active   TRIMETHOPRIM (TRIMPEX) 100 MG TABLET    Take by mouth.   VITAMIN D, ERGOCALCIFEROL, (DRISDOL) 50000 UNITS CAPS CAPSULE    Take by mouth.   ZONISAMIDE (ZONEGRAN) 100 MG CAPSULE    Take by mouth.    Review of Systems  Constitutional: Negative for fever, chills, diaphoresis, activity change, appetite change, fatigue and unexpected weight change.  HENT: Positive for congestion, rhinorrhea and sneezing. Negative for dental problem, drooling, ear discharge, ear pain, facial swelling,  hearing loss, mouth sores, postnasal drip, sinus pressure, sore throat, tinnitus, trouble swallowing and voice change.        Ears feel "Clogged"  Respiratory: Positive for cough and wheezing. Negative for apnea, choking, chest tightness, shortness of breath and stridor.   Cardiovascular: Negative for chest pain, palpitations and leg swelling.  Gastrointestinal: Positive for vomiting (Pt reports vomiting her pills out last night; but feels much better this morning. ). Negative for nausea, abdominal pain, diarrhea, constipation, blood in stool, abdominal distention, anal bleeding and rectal pain.  Skin: Positive for rash. Negative for color change, pallor and wound.    History  Substance Use Topics  . Smoking status: Current Every Day Smoker -- 1.00 packs/day for 30 years  . Smokeless tobacco: Never Used  . Alcohol Use: No   Objective:   BP 118/68 mmHg  Pulse 92  Temp(Src) 98.2 F (36.8 C) (Oral)  Resp 16  SpO2 92%  Physical Exam  Constitutional: She is oriented to person, place, and time. She appears well-developed and well-nourished.  Cardiovascular: Normal rate.   Pulmonary/Chest: Effort normal and breath sounds normal.  Neurological: She is alert and oriented to person, place, and time.  Skin: Skin is warm and dry.  Areas of healed blisters on hands noted.  Rash resolved.   Psychiatric: She has a normal mood and affect.      Assessment & Plan:     1. Rash Improved. Suspect was eczema.  Greatly improved. Will continue medication. Follow up as needed.  - triamcinolone (KENALOG) 0.025 % cream; Apply topically daily. And then apply gloves.  Dispense: 45 g; Refill: 3  2. Elevated WBC count Was treated. No signs of infection. Will recheck today.  - CBC with Differential/Platelet  3. Eczema of both hands Improved as above. Follow up as needed.   Lorie Phenix, MD        Lorie Phenix, MD  [facility]

## 2015-01-31 NOTE — Telephone Encounter (Signed)
[facility] called saying Mrs. Arras has a UTI and they want to know if they can get a urine sample and have it checked.  Please call Crystal back at [phone].  Thanks, Barth Kirks

## 2015-02-01 ENCOUNTER — Other Ambulatory Visit: Payer: Self-pay

## 2015-02-01 ENCOUNTER — Other Ambulatory Visit: Payer: Self-pay | Admitting: Family Medicine

## 2015-02-01 DIAGNOSIS — N3090 Cystitis, unspecified without hematuria: Secondary | ICD-10-CM

## 2015-02-01 DIAGNOSIS — N289 Disorder of kidney and ureter, unspecified: Secondary | ICD-10-CM

## 2015-02-01 LAB — CBC WITH DIFFERENTIAL/PLATELET
BASOS ABS: 0 10*3/uL (ref 0.0–0.2)
BASOS: 0 %
EOS (ABSOLUTE): 0.2 10*3/uL (ref 0.0–0.4)
Eos: 2 %
Hematocrit: 39.8 % (ref 34.0–46.6)
Hemoglobin: 13.5 g/dL (ref 11.1–15.9)
Immature Grans (Abs): 0 10*3/uL (ref 0.0–0.1)
Immature Granulocytes: 0 %
LYMPHS ABS: 2.2 10*3/uL (ref 0.7–3.1)
Lymphs: 22 %
MCH: 34.6 pg — AB (ref 26.6–33.0)
MCHC: 33.9 g/dL (ref 31.5–35.7)
MCV: 102 fL — ABNORMAL HIGH (ref 79–97)
MONOCYTES: 9 %
Monocytes Absolute: 0.9 10*3/uL (ref 0.1–0.9)
NEUTROS ABS: 6.9 10*3/uL (ref 1.4–7.0)
Neutrophils: 67 %
PLATELETS: 273 10*3/uL (ref 150–379)
RBC: 3.9 x10E6/uL (ref 3.77–5.28)
RDW: 13.3 % (ref 12.3–15.4)
WBC: 10.3 10*3/uL (ref 3.4–10.8)

## 2015-02-01 NOTE — Telephone Encounter (Signed)
Advised Rosa, pt's tech regarding the lab orders, [place] requested for the requisitions to be faxed to her, she was also advised of the above cbc results.

## 2015-02-01 NOTE — Telephone Encounter (Signed)
Yes. Ok to order urine. Thanks.  Also let them know CBC was normal yesterday. Thanks.

## 2015-02-02 ENCOUNTER — Other Ambulatory Visit: Payer: Self-pay

## 2015-02-02 DIAGNOSIS — L309 Dermatitis, unspecified: Secondary | ICD-10-CM

## 2015-02-02 MED ORDER — MOMETASONE FUROATE 0.1 % EX CREA: 1.0000 "application " | TOPICAL_CREAM | Freq: Every day | CUTANEOUS | Status: DC

## 2015-02-04 ENCOUNTER — Other Ambulatory Visit: Payer: Self-pay | Admitting: Family Medicine

## 2015-02-04 DIAGNOSIS — F419 Anxiety disorder, unspecified: Secondary | ICD-10-CM

## 2015-02-04 MED ORDER — CLONAZEPAM 0.5 MG PO TABS: 0.5000 mg | ORAL_TABLET | Freq: Three times a day (TID) | ORAL | Status: DC

## 2015-02-04 NOTE — Telephone Encounter (Signed)
[facility].   Thanks,   -Vernona Rieger

## 2015-02-04 NOTE — Telephone Encounter (Signed)
Please clarify dose for month from [employer] and then ok to call in 6 months. Thanks.

## 2015-02-04 NOTE — Telephone Encounter (Signed)
clonazePAM (KLONOPIN) 0.5 MG tablet  please send to [facility]  Thanks Barth Kirks

## 2015-02-04 NOTE — Telephone Encounter (Signed)
LOV 01/31/15

## 2015-02-23 DIAGNOSIS — F259 Schizoaffective disorder, unspecified: Secondary | ICD-10-CM | POA: Diagnosis not present

## 2015-03-19 DIAGNOSIS — M545 Low back pain: Secondary | ICD-10-CM | POA: Diagnosis not present

## 2015-03-19 DIAGNOSIS — F329 Major depressive disorder, single episode, unspecified: Secondary | ICD-10-CM | POA: Diagnosis not present

## 2015-03-19 DIAGNOSIS — M6281 Muscle weakness (generalized): Secondary | ICD-10-CM | POA: Diagnosis not present

## 2015-03-19 DIAGNOSIS — F209 Schizophrenia, unspecified: Secondary | ICD-10-CM | POA: Diagnosis not present

## 2015-03-19 DIAGNOSIS — J449 Chronic obstructive pulmonary disease, unspecified: Secondary | ICD-10-CM | POA: Diagnosis not present

## 2015-03-19 DIAGNOSIS — M1990 Unspecified osteoarthritis, unspecified site: Secondary | ICD-10-CM | POA: Diagnosis not present

## 2015-03-19 DIAGNOSIS — F17200 Nicotine dependence, unspecified, uncomplicated: Secondary | ICD-10-CM | POA: Diagnosis not present

## 2015-03-19 DIAGNOSIS — F419 Anxiety disorder, unspecified: Secondary | ICD-10-CM | POA: Diagnosis not present

## 2015-03-25 DIAGNOSIS — M545 Low back pain: Secondary | ICD-10-CM | POA: Diagnosis not present

## 2015-03-25 DIAGNOSIS — F329 Major depressive disorder, single episode, unspecified: Secondary | ICD-10-CM | POA: Diagnosis not present

## 2015-03-25 DIAGNOSIS — J449 Chronic obstructive pulmonary disease, unspecified: Secondary | ICD-10-CM | POA: Diagnosis not present

## 2015-03-25 DIAGNOSIS — F419 Anxiety disorder, unspecified: Secondary | ICD-10-CM | POA: Diagnosis not present

## 2015-03-25 DIAGNOSIS — M6281 Muscle weakness (generalized): Secondary | ICD-10-CM | POA: Diagnosis not present

## 2015-03-25 DIAGNOSIS — M1990 Unspecified osteoarthritis, unspecified site: Secondary | ICD-10-CM | POA: Diagnosis not present

## 2015-03-28 ENCOUNTER — Encounter: Payer: Self-pay | Admitting: Family Medicine

## 2015-03-28 ENCOUNTER — Ambulatory Visit (INDEPENDENT_AMBULATORY_CARE_PROVIDER_SITE_OTHER): Payer: Medicare Other | Admitting: Family Medicine

## 2015-03-28 VITALS — BP 126/72 | HR 100 | Temp 97.6°F | Resp 20

## 2015-03-28 DIAGNOSIS — F209 Schizophrenia, unspecified: Secondary | ICD-10-CM | POA: Insufficient documentation

## 2015-03-28 DIAGNOSIS — F2089 Other schizophrenia: Secondary | ICD-10-CM

## 2015-03-28 DIAGNOSIS — F32A Depression, unspecified: Secondary | ICD-10-CM

## 2015-03-28 DIAGNOSIS — M1990 Unspecified osteoarthritis, unspecified site: Secondary | ICD-10-CM | POA: Insufficient documentation

## 2015-03-28 DIAGNOSIS — R21 Rash and other nonspecific skin eruption: Secondary | ICD-10-CM

## 2015-03-28 DIAGNOSIS — J441 Chronic obstructive pulmonary disease with (acute) exacerbation: Secondary | ICD-10-CM

## 2015-03-28 DIAGNOSIS — F329 Major depressive disorder, single episode, unspecified: Secondary | ICD-10-CM | POA: Diagnosis not present

## 2015-03-28 DIAGNOSIS — F3130 Bipolar disorder, current episode depressed, mild or moderate severity, unspecified: Secondary | ICD-10-CM | POA: Diagnosis not present

## 2015-03-28 DIAGNOSIS — M159 Polyosteoarthritis, unspecified: Secondary | ICD-10-CM

## 2015-03-28 HISTORY — DX: Unspecified osteoarthritis, unspecified site: M19.90

## 2015-03-28 HISTORY — DX: Depression, unspecified: F32.A

## 2015-03-28 HISTORY — DX: Schizophrenia, unspecified: F20.9

## 2015-03-28 MED ORDER — DOXYCYCLINE HYCLATE 100 MG PO TABS: 100.0000 mg | ORAL_TABLET | Freq: Two times a day (BID) | ORAL | Status: DC

## 2015-03-28 MED ORDER — PREDNISONE 10 MG PO TABS: ORAL_TABLET | ORAL | Status: DC

## 2015-03-28 MED ORDER — MOMETASONE FUROATE 0.1 % EX CREA: 1.0000 "application " | TOPICAL_CREAM | Freq: Every day | CUTANEOUS | Status: DC

## 2015-03-28 NOTE — Progress Notes (Addendum)
Patient ID: Diana Mccoy, female   DOB: April 02, 1946, 69 y.o.   MRN: [id number]        Patient: Diana Mccoy Female    DOB: [birth year]   69 y.o.   MRN: [id number] Visit Date: 03/28/2015  Today's Provider: Lorie Phenix, MD   Chief Complaint  Patient presents with  . Cough   Subjective:    HPI   Cough: Patient complains of wheezing.  Symptoms began several weeks ago.  The cough is non-productive, without wheezing, dyspnea or hemoptysis, with wheezing and is aggravated by nothing Associated symptoms include:shortness of breath. Patient does not have new pets. Patient does have a history of asthma. Patient does not have a history of environmental allergens. Patient does not have recent travel. Patient does have a history of smoking.  Per care giver's note,  Pt is requesting Pepto Bismol with her medications.   Also, has a rash on her hand, mostly her thumb and her right leg.       Allergies  Allergen Reactions  . Ciprofloxacin Nausea And Vomiting  . Penicillins     rash, hives   Previous Medications   ACETAMINOPHEN (TYLENOL 8 HOUR ARTHRITIS PAIN) 650 MG CR TABLET    Take by mouth.   ALBUTEROL (VENTOLIN HFA) 108 (90 BASE) MCG/ACT INHALER    Inhale into the lungs.   ALENDRONATE (FOSAMAX) 70 MG TABLET    Take by mouth.   BUPROPION (WELLBUTRIN SR) 150 MG 12 HR TABLET    Take by mouth.   CALCIUM CARB-CHOLECALCIFEROL (OYSTER SHELL CALCIUM PLUS D) 500-200 MG-UNIT TABS    Take by mouth.   CLONAZEPAM (KLONOPIN) 0.5 MG TABLET    Take 1 tablet (0.5 mg total) by mouth 3 (three) times daily.   DOXYCYCLINE (VIBRA-TABS) 100 MG TABLET    Take 1 tablet (100 mg total) by mouth 2 (two) times daily.   ESOMEPRAZOLE (NEXIUM) 40 MG CAPSULE    Take by mouth.   ESTRADIOL (ESTRACE VAGINAL) 0.1 MG/GM VAGINAL CREAM    Place vaginally.   FERROUS SULFATE 325 (65 FE) MG TABLET    Take by mouth.   GLYCOPYRROLATE (ROBINUL) 2 MG TABLET    Take by mouth.   HYDROCODONE-ACETAMINOPHEN (NORCO/VICODIN) 5-325 MG PER TABLET     Take by mouth.   LORATADINE (CLARITIN) 10 MG TABLET    Take by mouth.   MAGNESIUM HYDROXIDE (MILK OF MAGNESIA CONCENTRATE) 2400 MG/10ML SUSP    Take by mouth.   MELOXICAM (MOBIC) 7.5 MG TABLET    Take by mouth.   MOMETASONE (ELOCON) 0.1 % CREAM    Apply 1 application topically daily. To hands every evening and then cover with cotton gloves.   NYSTATIN OINTMENT (MYCOSTATIN)    NYSTATIN, 100000 UNIT/GM (External Ointment)  1 Ointment Ointment Apply to affected area twice a day for 0 days  Quantity: 60;  Refills: 0   Ordered :14-Aug-2013  Lorie Phenix MD;  Started 14-Aug-2013 Active   PALIPERIDONE (INVEGA SUSTENNA) 156 MG/ML SUSP INJECTION    Inject 156 mg into the muscle every 30 (thirty) days.   PALIPERIDONE (INVEGA) 6 MG 24 HR TABLET    Take 6 mg by mouth at bedtime.   RISPERIDONE (RISPERDAL) 4 MG TABLET    Take by mouth.   TIOTROPIUM (SPIRIVA HANDIHALER) 18 MCG INHALATION CAPSULE    Place into inhaler and inhale.   TRAZODONE (DESYREL) 100 MG TABLET    Take by mouth.   TRIAMCINOLONE (KENALOG) 0.025 % CREAM    Apply  topically daily. And then apply gloves.   TRIMETHOPRIM (TRIMPEX) 100 MG TABLET    Take by mouth.   VITAMIN D, ERGOCALCIFEROL, (DRISDOL) 50000 UNITS CAPS CAPSULE    Take by mouth.   ZONISAMIDE (ZONEGRAN) 100 MG CAPSULE    Take by mouth.    Review of Systems  HENT: Positive for congestion. Negative for ear pain, rhinorrhea and sore throat.   Respiratory: Positive for cough, shortness of breath and wheezing.   Cardiovascular: Negative.     Social History  Substance Use Topics  . Smoking status: Current Every Day Smoker -- 1.00 packs/day for 30 years  . Smokeless tobacco: Never Used  . Alcohol Use: No   Objective:   BP 126/72 mmHg  Pulse 100  Temp(Src) 97.6 F (36.4 C)  Resp 20  SpO2 98%  Physical Exam  Constitutional: She is oriented to person, place, and time. She appears well-developed.  Cardiovascular: Normal rate and regular rhythm.   Pulmonary/Chest:  Effort normal. She has wheezes.  Neurological: She is alert and oriented to person, place, and time.  Skin: Rash noted.  On her left thumb and right leg.    Psychiatric: She has a normal mood and affect. Her behavior is normal. Judgment and thought content normal.      Assessment & Plan:     1. COPD exacerbation Condition is worsening. Will start medication for better control.  Patient instructed to call back if condition worsens or does not improve.    - predniSONE (DELTASONE) 10 MG tablet; 6 po for 2 days and then 5 po for 2 days and then 4 po for 2 days and 3 po for 2 days and then 2 po for 2 days and then 1 po for 2 days.  Dispense: 42 tablet; Refill: 0 - doxycycline (VIBRA-TABS) 100 MG tablet; Take 1 tablet (100 mg total) by mouth 2 (two) times daily.  Dispense: 20 tablet; Refill: 0  Home Nursing evaluation and treat for SOB.  2. Rash Will add steroid cream.  - mometasone (ELOCON) 0.1 % cream; Apply 1 application topically daily. To hands every evening and then cover with cotton gloves.  Dispense: 45 g; Refill: 3  3. Bipolar disorder, most recent episode depressed, remission status unspecified Stable.   4. Other schizophrenia Stable.   5. Depression Stable.  6. Osteoarthritis of multiple joints, unspecified osteoarthritis type Stable, continue medication.      Lorie Phenix, MD  [facility]

## 2015-03-29 DIAGNOSIS — M6281 Muscle weakness (generalized): Secondary | ICD-10-CM | POA: Diagnosis not present

## 2015-03-29 DIAGNOSIS — J449 Chronic obstructive pulmonary disease, unspecified: Secondary | ICD-10-CM | POA: Diagnosis not present

## 2015-03-29 DIAGNOSIS — F329 Major depressive disorder, single episode, unspecified: Secondary | ICD-10-CM | POA: Diagnosis not present

## 2015-03-29 DIAGNOSIS — M1990 Unspecified osteoarthritis, unspecified site: Secondary | ICD-10-CM | POA: Diagnosis not present

## 2015-03-29 DIAGNOSIS — F419 Anxiety disorder, unspecified: Secondary | ICD-10-CM | POA: Diagnosis not present

## 2015-03-29 DIAGNOSIS — M545 Low back pain: Secondary | ICD-10-CM | POA: Diagnosis not present

## 2015-03-31 DIAGNOSIS — M6281 Muscle weakness (generalized): Secondary | ICD-10-CM | POA: Diagnosis not present

## 2015-03-31 DIAGNOSIS — F329 Major depressive disorder, single episode, unspecified: Secondary | ICD-10-CM | POA: Diagnosis not present

## 2015-03-31 DIAGNOSIS — F419 Anxiety disorder, unspecified: Secondary | ICD-10-CM | POA: Diagnosis not present

## 2015-03-31 DIAGNOSIS — J449 Chronic obstructive pulmonary disease, unspecified: Secondary | ICD-10-CM | POA: Diagnosis not present

## 2015-03-31 DIAGNOSIS — M545 Low back pain: Secondary | ICD-10-CM | POA: Diagnosis not present

## 2015-03-31 DIAGNOSIS — M1990 Unspecified osteoarthritis, unspecified site: Secondary | ICD-10-CM | POA: Diagnosis not present

## 2015-04-01 DIAGNOSIS — F419 Anxiety disorder, unspecified: Secondary | ICD-10-CM | POA: Diagnosis not present

## 2015-04-01 DIAGNOSIS — F329 Major depressive disorder, single episode, unspecified: Secondary | ICD-10-CM | POA: Diagnosis not present

## 2015-04-01 DIAGNOSIS — J449 Chronic obstructive pulmonary disease, unspecified: Secondary | ICD-10-CM | POA: Diagnosis not present

## 2015-04-01 DIAGNOSIS — M6281 Muscle weakness (generalized): Secondary | ICD-10-CM | POA: Diagnosis not present

## 2015-04-01 DIAGNOSIS — M1990 Unspecified osteoarthritis, unspecified site: Secondary | ICD-10-CM | POA: Diagnosis not present

## 2015-04-01 DIAGNOSIS — M545 Low back pain: Secondary | ICD-10-CM | POA: Diagnosis not present

## 2015-04-04 DIAGNOSIS — M545 Low back pain: Secondary | ICD-10-CM | POA: Diagnosis not present

## 2015-04-04 DIAGNOSIS — J449 Chronic obstructive pulmonary disease, unspecified: Secondary | ICD-10-CM | POA: Diagnosis not present

## 2015-04-04 DIAGNOSIS — M1990 Unspecified osteoarthritis, unspecified site: Secondary | ICD-10-CM | POA: Diagnosis not present

## 2015-04-04 DIAGNOSIS — F419 Anxiety disorder, unspecified: Secondary | ICD-10-CM | POA: Diagnosis not present

## 2015-04-04 DIAGNOSIS — F329 Major depressive disorder, single episode, unspecified: Secondary | ICD-10-CM | POA: Diagnosis not present

## 2015-04-04 DIAGNOSIS — M6281 Muscle weakness (generalized): Secondary | ICD-10-CM | POA: Diagnosis not present

## 2015-04-08 ENCOUNTER — Telehealth: Payer: Self-pay | Admitting: Family Medicine

## 2015-04-08 ENCOUNTER — Encounter: Payer: Medicare Other | Admitting: Family Medicine

## 2015-04-08 DIAGNOSIS — F419 Anxiety disorder, unspecified: Secondary | ICD-10-CM | POA: Diagnosis not present

## 2015-04-08 DIAGNOSIS — M6281 Muscle weakness (generalized): Secondary | ICD-10-CM | POA: Diagnosis not present

## 2015-04-08 DIAGNOSIS — M545 Low back pain: Secondary | ICD-10-CM | POA: Diagnosis not present

## 2015-04-08 DIAGNOSIS — F329 Major depressive disorder, single episode, unspecified: Secondary | ICD-10-CM | POA: Diagnosis not present

## 2015-04-08 DIAGNOSIS — M1990 Unspecified osteoarthritis, unspecified site: Secondary | ICD-10-CM | POA: Diagnosis not present

## 2015-04-08 DIAGNOSIS — J449 Chronic obstructive pulmonary disease, unspecified: Secondary | ICD-10-CM | POA: Diagnosis not present

## 2015-04-08 NOTE — Telephone Encounter (Signed)
Britt Boozer states Pt is having some wheezing,  Britt Boozer would like to know what type of treatment would be better for pt. CB# [phone] CC

## 2015-04-08 NOTE — Telephone Encounter (Signed)
Please call [facility] and order spacer. Thanks.

## 2015-04-08 NOTE — Telephone Encounter (Signed)
Diana Mccoy, reports this is not a change.  She just thinks Reese is not using her inhaler right. (She is not taking a deep breath in.)  She is wondering if she could change to a nebulizer or maybe use a spacer with her inhaler.  Please send order to [facility].   Thanks,   -Vernona Rieger

## 2015-04-08 NOTE — Telephone Encounter (Signed)
Spacer called in.  Thanks,   -Vernona Rieger

## 2015-04-08 NOTE — Telephone Encounter (Signed)
Is this a change?   Has she used her albuterol inhaler? We have office hours tomorrow if worsens. Thanks.

## 2015-04-11 ENCOUNTER — Encounter: Payer: Medicare Other | Admitting: Family Medicine

## 2015-04-11 DIAGNOSIS — M6281 Muscle weakness (generalized): Secondary | ICD-10-CM | POA: Diagnosis not present

## 2015-04-11 DIAGNOSIS — F329 Major depressive disorder, single episode, unspecified: Secondary | ICD-10-CM | POA: Diagnosis not present

## 2015-04-11 DIAGNOSIS — M1990 Unspecified osteoarthritis, unspecified site: Secondary | ICD-10-CM | POA: Diagnosis not present

## 2015-04-11 DIAGNOSIS — J449 Chronic obstructive pulmonary disease, unspecified: Secondary | ICD-10-CM | POA: Diagnosis not present

## 2015-04-11 DIAGNOSIS — F419 Anxiety disorder, unspecified: Secondary | ICD-10-CM | POA: Diagnosis not present

## 2015-04-11 DIAGNOSIS — M545 Low back pain: Secondary | ICD-10-CM | POA: Diagnosis not present

## 2015-04-13 DIAGNOSIS — F329 Major depressive disorder, single episode, unspecified: Secondary | ICD-10-CM | POA: Diagnosis not present

## 2015-04-13 DIAGNOSIS — M545 Low back pain: Secondary | ICD-10-CM | POA: Diagnosis not present

## 2015-04-13 DIAGNOSIS — M1990 Unspecified osteoarthritis, unspecified site: Secondary | ICD-10-CM | POA: Diagnosis not present

## 2015-04-13 DIAGNOSIS — F419 Anxiety disorder, unspecified: Secondary | ICD-10-CM | POA: Diagnosis not present

## 2015-04-13 DIAGNOSIS — M6281 Muscle weakness (generalized): Secondary | ICD-10-CM | POA: Diagnosis not present

## 2015-04-13 DIAGNOSIS — J449 Chronic obstructive pulmonary disease, unspecified: Secondary | ICD-10-CM | POA: Diagnosis not present

## 2015-04-14 DIAGNOSIS — M6281 Muscle weakness (generalized): Secondary | ICD-10-CM | POA: Diagnosis not present

## 2015-04-14 DIAGNOSIS — M1990 Unspecified osteoarthritis, unspecified site: Secondary | ICD-10-CM | POA: Diagnosis not present

## 2015-04-14 DIAGNOSIS — F419 Anxiety disorder, unspecified: Secondary | ICD-10-CM | POA: Diagnosis not present

## 2015-04-14 DIAGNOSIS — J449 Chronic obstructive pulmonary disease, unspecified: Secondary | ICD-10-CM | POA: Diagnosis not present

## 2015-04-14 DIAGNOSIS — F329 Major depressive disorder, single episode, unspecified: Secondary | ICD-10-CM | POA: Diagnosis not present

## 2015-04-14 DIAGNOSIS — M545 Low back pain: Secondary | ICD-10-CM | POA: Diagnosis not present

## 2015-04-18 ENCOUNTER — Encounter: Payer: Self-pay | Admitting: Family Medicine

## 2015-04-18 ENCOUNTER — Ambulatory Visit (INDEPENDENT_AMBULATORY_CARE_PROVIDER_SITE_OTHER): Payer: Medicare Other | Admitting: Family Medicine

## 2015-04-18 VITALS — BP 122/84 | HR 76 | Temp 98.2°F | Resp 16

## 2015-04-18 DIAGNOSIS — M1990 Unspecified osteoarthritis, unspecified site: Secondary | ICD-10-CM

## 2015-04-18 DIAGNOSIS — J441 Chronic obstructive pulmonary disease with (acute) exacerbation: Secondary | ICD-10-CM

## 2015-04-18 DIAGNOSIS — R2681 Unsteadiness on feet: Secondary | ICD-10-CM | POA: Insufficient documentation

## 2015-04-18 HISTORY — DX: Unsteadiness on feet: R26.81

## 2015-04-18 MED ORDER — ALBUTEROL SULFATE (2.5 MG/3ML) 0.083% IN NEBU: 2.5000 mg | INHALATION_SOLUTION | RESPIRATORY_TRACT | Status: DC | PRN

## 2015-04-18 NOTE — Progress Notes (Signed)
Patient ID: Diana Mccoy, female   DOB: 09-13-1945, 69 y.o.   MRN: [id number]         Patient: Diana Mccoy Female    DOB: Oct 20, 1945   69 y.o.   MRN: [id number] Visit Date: 04/18/2015  Today's Provider: Lorie Phenix, MD   Chief Complaint  Patient presents with  . COPD   Subjective:    Breathing Problem She complains of cough, difficulty breathing, shortness of breath, sputum production and wheezing. This is a chronic problem. The problem occurs constantly. The problem has been gradually worsening. The cough is productive of sputum. Associated symptoms include dyspnea on exertion, nasal congestion, postnasal drip and sneezing. Pertinent negatives include no appetite change, chest pain, ear congestion, ear pain, fever, headaches, myalgias, rhinorrhea, sore throat, trouble swallowing or weight loss. Her symptoms are aggravated by exposure to smoke. Her symptoms are alleviated by steroid inhaler (Inhaler is helping; but she reports she needs it more often.  Pt also needs a rolling walker with a seat secondary to shortness of breath. ). Risk factors for lung disease include smoking/tobacco exposure. Her past medical history is significant for COPD and emphysema.  Emesis  This is a new problem. The current episode started today. The problem occurs less than 2 times per day. The problem has been gradually improving. Associated symptoms include coughing. Pertinent negatives include no abdominal pain, arthralgias, chest pain, chills, diarrhea, dizziness, fever, headaches, myalgias, URI or weight loss. Risk factors include ill contacts (One other resident is also sick.  ).  Arthritis Presents for follow-up visit. The disease course has been stable (Pt is doing well with current medications; and also the use of a rolling walker to help with gait.  ). She complains of pain and stiffness. Affected locations include the right hip, left hip, right knee and left knee. Associated symptoms include fatigue.  Pertinent negatives include no diarrhea, dry mouth, dysuria, fever, rash or weight loss. Her past medical history is significant for osteoarthritis. There is no history of rheumatoid arthritis.  The treatment provided mild relief. Compliance with prior treatments has been good. Compliance with medications is 76-100%.       Allergies  Allergen Reactions  . Ciprofloxacin Nausea And Vomiting  . Penicillins     rash, hives  . Sodium Thiosulfate   . Sulfa Antibiotics    Previous Medications   ACETAMINOPHEN (TYLENOL) 325 MG TABLET    Take 650 mg by mouth.   ALBUTEROL (VENTOLIN HFA) 108 (90 BASE) MCG/ACT INHALER    Inhale 2 puffs into the lungs every 4 (four) hours as needed.    ALENDRONATE (FOSAMAX) 70 MG TABLET    Take 70 mg by mouth once a week.    BISMUTH SUBSALICYLATE (PEPTO-BISMOL PO)    Take 15 mLs by mouth.   BUPROPION (WELLBUTRIN SR) 150 MG 12 HR TABLET    Take 150 mg by mouth 2 (two) times daily.    CALCIUM CARB-CHOLECALCIFEROL (OYSTER SHELL CALCIUM PLUS D) 500-200 MG-UNIT TABS    Take 1 tablet by mouth 3 (three) times daily.    CLONAZEPAM (KLONOPIN) 0.5 MG TABLET    Take 1 tablet (0.5 mg total) by mouth 3 (three) times daily.   DOCUSATE SODIUM (COLACE) 100 MG CAPSULE    Take 100 mg by mouth 2 (two) times daily.   DOXYCYCLINE (VIBRA-TABS) 100 MG TABLET    Take 1 tablet (100 mg total) by mouth 2 (two) times daily.   ESOMEPRAZOLE (NEXIUM) 40 MG CAPSULE  Take 40 mg by mouth daily at 12 noon.    ESTRADIOL (ESTRACE VAGINAL) 0.1 MG/GM VAGINAL CREAM    Place vaginally 3 (three) times a week.    FERROUS SULFATE 325 (65 FE) MG TABLET    Take 325 mg by mouth 2 (two) times daily with a meal.    GLYCOPYRROLATE (ROBINUL) 2 MG TABLET    Take 2 mg by mouth 3 (three) times daily.    HYDROCODONE-ACETAMINOPHEN (NORCO/VICODIN) 5-325 MG PER TABLET    Take 1 tablet by mouth every 6 (six) hours as needed.    LORATADINE (CLARITIN) 10 MG TABLET    Take 10 mg by mouth daily.    MAGNESIUM HYDROXIDE (MILK OF  MAGNESIA CONCENTRATE) 2400 MG/10ML SUSP    Take 30 mLs by mouth.    MELOXICAM (MOBIC) 7.5 MG TABLET    Take 7.5 mg by mouth 2 (two) times daily.    MOMETASONE (ELOCON) 0.1 % CREAM    Apply 1 application topically daily. To hands every evening and then cover with cotton gloves.   NYSTATIN OINTMENT (MYCOSTATIN)    NYSTATIN, 100000 UNIT/GM (External Ointment)  1 Ointment Ointment Apply to affected area twice a day for 0 days  Quantity: 60;  Refills: 0   Ordered :14-Aug-2013  Lorie Phenix MD;  Started 14-Aug-2013 Active   PALIPERIDONE (INVEGA SUSTENNA) 156 MG/ML SUSP INJECTION    Inject 156 mg into the muscle every 30 (thirty) days.   PALIPERIDONE (INVEGA) 6 MG 24 HR TABLET    Take 6 mg by mouth at bedtime.   PREDNISONE (DELTASONE) 10 MG TABLET    6 po for 2 days and then 5 po for 2 days and then 4 po for 2 days and 3 po for 2 days and then 2 po for 2 days and then 1 po for 2 days.   TRAZODONE (DESYREL) 100 MG TABLET    Take 100 mg by mouth at bedtime.   TRIMETHOPRIM (TRIMPEX) 100 MG TABLET    Take 100 mg by mouth daily.    VITAMIN D, ERGOCALCIFEROL, (DRISDOL) 50000 UNITS CAPS CAPSULE    Take 50,000 Units by mouth every 30 (thirty) days.    ZONISAMIDE (ZONEGRAN) 100 MG CAPSULE    Take 200 mg by mouth daily.     Review of Systems  Constitutional: Positive for fatigue. Negative for fever, chills, weight loss, diaphoresis, activity change, appetite change and unexpected weight change.  HENT: Positive for congestion, postnasal drip and sneezing. Negative for dental problem, drooling, ear discharge, ear pain, facial swelling, hearing loss, mouth sores, nosebleeds, rhinorrhea, sinus pressure, sore throat, tinnitus, trouble swallowing and voice change.   Eyes: Positive for pain. Negative for photophobia, discharge, redness, itching and visual disturbance.  Respiratory: Positive for cough, sputum production, chest tightness, shortness of breath and wheezing. Negative for apnea, choking and stridor.     Cardiovascular: Positive for dyspnea on exertion. Negative for chest pain, palpitations and leg swelling.  Gastrointestinal: Positive for nausea and vomiting (Pt vomited two times today. ). Negative for abdominal pain, diarrhea, constipation, blood in stool, abdominal distention, anal bleeding and rectal pain.  Genitourinary: Negative for dysuria.  Musculoskeletal: Positive for arthritis and stiffness. Negative for myalgias and arthralgias.  Skin: Negative for rash.  Neurological: Negative for dizziness and headaches.    Social History  Substance Use Topics  . Smoking status: Current Every Day Smoker -- 1.00 packs/day for 30 years  . Smokeless tobacco: Never Used  . Alcohol Use: No   Objective:  BP 122/84 mmHg  Pulse 76  Temp(Src) 98.2 F (36.8 C) (Oral)  Resp 16  Physical Exam  Constitutional: She is oriented to person, place, and time. She appears well-developed and well-nourished.  HENT:  Head: Normocephalic and atraumatic.  Right Ear: External ear normal.  Left Ear: External ear normal.  Mouth/Throat: Oropharynx is clear and moist.  Eyes: Conjunctivae and EOM are normal. Pupils are equal, round, and reactive to light.  Neck: Normal range of motion. Neck supple.  Cardiovascular: Normal rate and regular rhythm.   Pulmonary/Chest: Effort normal. She has wheezes.  Neurological: She is alert and oriented to person, place, and time.  Psychiatric: She has a normal mood and affect. Her behavior is normal. Judgment and thought content normal.      Assessment & Plan:     1. COPD exacerbation [facility] Patient has not been getting good relief from her inhaler, may not be using it correctly.   Did not like spacer. Will start nebulizer as needed and see if helps. Did try Xopenex  In office today with good response.  - albuterol (PROVENTIL) (2.5 MG/3ML) 0.083% nebulizer solution; Take 3 mLs (2.5 mg total) by nebulization every 4 (four) hours as needed for wheezing or shortness of breath.   Dispense: 360 mL; Refill: 12  2. Gait instability Filled out paper work for walker today. Walks slowly and steadily with walker. Needs it for balance and gait stability.   Uses it when she is out and at home.   Not steady without it. Also needs walker with seat secondary has to stop secondary to fatigue and SOB.   3. Osteoarthritis, unspecified osteoarthritis type, unspecified site Continue medication and walker.       Lorie PhenixNancy Shantrell Placzek, MD  [facility]

## 2015-04-19 DIAGNOSIS — M545 Low back pain: Secondary | ICD-10-CM | POA: Diagnosis not present

## 2015-04-19 DIAGNOSIS — M1990 Unspecified osteoarthritis, unspecified site: Secondary | ICD-10-CM | POA: Diagnosis not present

## 2015-04-19 DIAGNOSIS — F419 Anxiety disorder, unspecified: Secondary | ICD-10-CM | POA: Diagnosis not present

## 2015-04-19 DIAGNOSIS — M6281 Muscle weakness (generalized): Secondary | ICD-10-CM | POA: Diagnosis not present

## 2015-04-19 DIAGNOSIS — J449 Chronic obstructive pulmonary disease, unspecified: Secondary | ICD-10-CM | POA: Diagnosis not present

## 2015-04-19 DIAGNOSIS — F329 Major depressive disorder, single episode, unspecified: Secondary | ICD-10-CM | POA: Diagnosis not present

## 2015-04-21 DIAGNOSIS — J449 Chronic obstructive pulmonary disease, unspecified: Secondary | ICD-10-CM | POA: Diagnosis not present

## 2015-04-21 DIAGNOSIS — M1990 Unspecified osteoarthritis, unspecified site: Secondary | ICD-10-CM | POA: Diagnosis not present

## 2015-04-21 DIAGNOSIS — M6281 Muscle weakness (generalized): Secondary | ICD-10-CM | POA: Diagnosis not present

## 2015-04-21 DIAGNOSIS — F329 Major depressive disorder, single episode, unspecified: Secondary | ICD-10-CM | POA: Diagnosis not present

## 2015-04-21 DIAGNOSIS — F419 Anxiety disorder, unspecified: Secondary | ICD-10-CM | POA: Diagnosis not present

## 2015-04-21 DIAGNOSIS — M545 Low back pain: Secondary | ICD-10-CM | POA: Diagnosis not present

## 2015-04-26 DIAGNOSIS — J449 Chronic obstructive pulmonary disease, unspecified: Secondary | ICD-10-CM | POA: Diagnosis not present

## 2015-04-26 DIAGNOSIS — M545 Low back pain: Secondary | ICD-10-CM | POA: Diagnosis not present

## 2015-04-26 DIAGNOSIS — F329 Major depressive disorder, single episode, unspecified: Secondary | ICD-10-CM | POA: Diagnosis not present

## 2015-04-26 DIAGNOSIS — M6281 Muscle weakness (generalized): Secondary | ICD-10-CM | POA: Diagnosis not present

## 2015-04-26 DIAGNOSIS — M1990 Unspecified osteoarthritis, unspecified site: Secondary | ICD-10-CM | POA: Diagnosis not present

## 2015-04-26 DIAGNOSIS — F419 Anxiety disorder, unspecified: Secondary | ICD-10-CM | POA: Diagnosis not present

## 2015-04-29 DIAGNOSIS — J449 Chronic obstructive pulmonary disease, unspecified: Secondary | ICD-10-CM | POA: Diagnosis not present

## 2015-04-29 DIAGNOSIS — M6281 Muscle weakness (generalized): Secondary | ICD-10-CM | POA: Diagnosis not present

## 2015-04-29 DIAGNOSIS — F329 Major depressive disorder, single episode, unspecified: Secondary | ICD-10-CM | POA: Diagnosis not present

## 2015-04-29 DIAGNOSIS — M1990 Unspecified osteoarthritis, unspecified site: Secondary | ICD-10-CM | POA: Diagnosis not present

## 2015-04-29 DIAGNOSIS — F419 Anxiety disorder, unspecified: Secondary | ICD-10-CM | POA: Diagnosis not present

## 2015-04-29 DIAGNOSIS — M545 Low back pain: Secondary | ICD-10-CM | POA: Diagnosis not present

## 2015-05-03 DIAGNOSIS — F329 Major depressive disorder, single episode, unspecified: Secondary | ICD-10-CM | POA: Diagnosis not present

## 2015-05-03 DIAGNOSIS — M545 Low back pain: Secondary | ICD-10-CM | POA: Diagnosis not present

## 2015-05-03 DIAGNOSIS — M6281 Muscle weakness (generalized): Secondary | ICD-10-CM | POA: Diagnosis not present

## 2015-05-03 DIAGNOSIS — F419 Anxiety disorder, unspecified: Secondary | ICD-10-CM | POA: Diagnosis not present

## 2015-05-03 DIAGNOSIS — J449 Chronic obstructive pulmonary disease, unspecified: Secondary | ICD-10-CM | POA: Diagnosis not present

## 2015-05-03 DIAGNOSIS — M1990 Unspecified osteoarthritis, unspecified site: Secondary | ICD-10-CM | POA: Diagnosis not present

## 2015-05-05 DIAGNOSIS — M545 Low back pain: Secondary | ICD-10-CM | POA: Diagnosis not present

## 2015-05-05 DIAGNOSIS — M1990 Unspecified osteoarthritis, unspecified site: Secondary | ICD-10-CM | POA: Diagnosis not present

## 2015-05-05 DIAGNOSIS — F329 Major depressive disorder, single episode, unspecified: Secondary | ICD-10-CM | POA: Diagnosis not present

## 2015-05-05 DIAGNOSIS — J449 Chronic obstructive pulmonary disease, unspecified: Secondary | ICD-10-CM | POA: Diagnosis not present

## 2015-05-05 DIAGNOSIS — F419 Anxiety disorder, unspecified: Secondary | ICD-10-CM | POA: Diagnosis not present

## 2015-05-05 DIAGNOSIS — M6281 Muscle weakness (generalized): Secondary | ICD-10-CM | POA: Diagnosis not present

## 2015-05-11 DIAGNOSIS — F419 Anxiety disorder, unspecified: Secondary | ICD-10-CM | POA: Diagnosis not present

## 2015-05-11 DIAGNOSIS — M545 Low back pain: Secondary | ICD-10-CM | POA: Diagnosis not present

## 2015-05-11 DIAGNOSIS — M1990 Unspecified osteoarthritis, unspecified site: Secondary | ICD-10-CM | POA: Diagnosis not present

## 2015-05-11 DIAGNOSIS — J449 Chronic obstructive pulmonary disease, unspecified: Secondary | ICD-10-CM | POA: Diagnosis not present

## 2015-05-11 DIAGNOSIS — M6281 Muscle weakness (generalized): Secondary | ICD-10-CM | POA: Diagnosis not present

## 2015-05-11 DIAGNOSIS — F329 Major depressive disorder, single episode, unspecified: Secondary | ICD-10-CM | POA: Diagnosis not present

## 2015-05-12 DIAGNOSIS — F329 Major depressive disorder, single episode, unspecified: Secondary | ICD-10-CM | POA: Diagnosis not present

## 2015-05-12 DIAGNOSIS — M6281 Muscle weakness (generalized): Secondary | ICD-10-CM | POA: Diagnosis not present

## 2015-05-12 DIAGNOSIS — J449 Chronic obstructive pulmonary disease, unspecified: Secondary | ICD-10-CM | POA: Diagnosis not present

## 2015-05-12 DIAGNOSIS — M1990 Unspecified osteoarthritis, unspecified site: Secondary | ICD-10-CM | POA: Diagnosis not present

## 2015-05-12 DIAGNOSIS — M545 Low back pain: Secondary | ICD-10-CM | POA: Diagnosis not present

## 2015-05-12 DIAGNOSIS — F419 Anxiety disorder, unspecified: Secondary | ICD-10-CM | POA: Diagnosis not present

## 2015-05-17 DIAGNOSIS — M1990 Unspecified osteoarthritis, unspecified site: Secondary | ICD-10-CM | POA: Diagnosis not present

## 2015-05-17 DIAGNOSIS — M545 Low back pain: Secondary | ICD-10-CM | POA: Diagnosis not present

## 2015-05-17 DIAGNOSIS — F419 Anxiety disorder, unspecified: Secondary | ICD-10-CM | POA: Diagnosis not present

## 2015-05-17 DIAGNOSIS — M6281 Muscle weakness (generalized): Secondary | ICD-10-CM | POA: Diagnosis not present

## 2015-05-17 DIAGNOSIS — J449 Chronic obstructive pulmonary disease, unspecified: Secondary | ICD-10-CM | POA: Diagnosis not present

## 2015-05-17 DIAGNOSIS — F329 Major depressive disorder, single episode, unspecified: Secondary | ICD-10-CM | POA: Diagnosis not present

## 2015-05-18 DIAGNOSIS — J449 Chronic obstructive pulmonary disease, unspecified: Secondary | ICD-10-CM | POA: Diagnosis not present

## 2015-05-18 DIAGNOSIS — F209 Schizophrenia, unspecified: Secondary | ICD-10-CM | POA: Diagnosis not present

## 2015-05-18 DIAGNOSIS — F419 Anxiety disorder, unspecified: Secondary | ICD-10-CM | POA: Diagnosis not present

## 2015-05-18 DIAGNOSIS — F17210 Nicotine dependence, cigarettes, uncomplicated: Secondary | ICD-10-CM | POA: Diagnosis not present

## 2015-05-18 DIAGNOSIS — F329 Major depressive disorder, single episode, unspecified: Secondary | ICD-10-CM | POA: Diagnosis not present

## 2015-05-18 DIAGNOSIS — M6281 Muscle weakness (generalized): Secondary | ICD-10-CM | POA: Diagnosis not present

## 2015-05-19 DIAGNOSIS — F329 Major depressive disorder, single episode, unspecified: Secondary | ICD-10-CM | POA: Diagnosis not present

## 2015-05-19 DIAGNOSIS — M6281 Muscle weakness (generalized): Secondary | ICD-10-CM | POA: Diagnosis not present

## 2015-05-19 DIAGNOSIS — F17210 Nicotine dependence, cigarettes, uncomplicated: Secondary | ICD-10-CM | POA: Diagnosis not present

## 2015-05-19 DIAGNOSIS — J449 Chronic obstructive pulmonary disease, unspecified: Secondary | ICD-10-CM | POA: Diagnosis not present

## 2015-05-19 DIAGNOSIS — F209 Schizophrenia, unspecified: Secondary | ICD-10-CM | POA: Diagnosis not present

## 2015-05-19 DIAGNOSIS — F419 Anxiety disorder, unspecified: Secondary | ICD-10-CM | POA: Diagnosis not present

## 2015-05-23 DIAGNOSIS — F17210 Nicotine dependence, cigarettes, uncomplicated: Secondary | ICD-10-CM | POA: Diagnosis not present

## 2015-05-23 DIAGNOSIS — F209 Schizophrenia, unspecified: Secondary | ICD-10-CM | POA: Diagnosis not present

## 2015-05-23 DIAGNOSIS — F419 Anxiety disorder, unspecified: Secondary | ICD-10-CM | POA: Diagnosis not present

## 2015-05-23 DIAGNOSIS — M6281 Muscle weakness (generalized): Secondary | ICD-10-CM | POA: Diagnosis not present

## 2015-05-23 DIAGNOSIS — J449 Chronic obstructive pulmonary disease, unspecified: Secondary | ICD-10-CM | POA: Diagnosis not present

## 2015-05-23 DIAGNOSIS — F329 Major depressive disorder, single episode, unspecified: Secondary | ICD-10-CM | POA: Diagnosis not present

## 2015-05-25 DIAGNOSIS — F259 Schizoaffective disorder, unspecified: Secondary | ICD-10-CM | POA: Diagnosis not present

## 2015-05-25 DIAGNOSIS — F329 Major depressive disorder, single episode, unspecified: Secondary | ICD-10-CM | POA: Diagnosis not present

## 2015-05-25 DIAGNOSIS — F418 Other specified anxiety disorders: Secondary | ICD-10-CM | POA: Diagnosis not present

## 2015-05-27 DIAGNOSIS — J449 Chronic obstructive pulmonary disease, unspecified: Secondary | ICD-10-CM | POA: Diagnosis not present

## 2015-05-27 DIAGNOSIS — F419 Anxiety disorder, unspecified: Secondary | ICD-10-CM | POA: Diagnosis not present

## 2015-05-27 DIAGNOSIS — F209 Schizophrenia, unspecified: Secondary | ICD-10-CM | POA: Diagnosis not present

## 2015-05-27 DIAGNOSIS — F17210 Nicotine dependence, cigarettes, uncomplicated: Secondary | ICD-10-CM | POA: Diagnosis not present

## 2015-05-27 DIAGNOSIS — F329 Major depressive disorder, single episode, unspecified: Secondary | ICD-10-CM | POA: Diagnosis not present

## 2015-05-27 DIAGNOSIS — M6281 Muscle weakness (generalized): Secondary | ICD-10-CM | POA: Diagnosis not present

## 2015-05-30 DIAGNOSIS — F419 Anxiety disorder, unspecified: Secondary | ICD-10-CM | POA: Diagnosis not present

## 2015-05-30 DIAGNOSIS — F209 Schizophrenia, unspecified: Secondary | ICD-10-CM | POA: Diagnosis not present

## 2015-05-30 DIAGNOSIS — F329 Major depressive disorder, single episode, unspecified: Secondary | ICD-10-CM | POA: Diagnosis not present

## 2015-05-30 DIAGNOSIS — F17210 Nicotine dependence, cigarettes, uncomplicated: Secondary | ICD-10-CM | POA: Diagnosis not present

## 2015-05-30 DIAGNOSIS — M6281 Muscle weakness (generalized): Secondary | ICD-10-CM | POA: Diagnosis not present

## 2015-05-30 DIAGNOSIS — J449 Chronic obstructive pulmonary disease, unspecified: Secondary | ICD-10-CM | POA: Diagnosis not present

## 2015-05-31 ENCOUNTER — Telehealth: Payer: Self-pay | Admitting: Family Medicine

## 2015-05-31 NOTE — Telephone Encounter (Signed)
Marchelle FolksAmanda with [facility] calling stating she is checking on a fax that she has  faxed 2 xs for a refill on HYDROcodone-acetaminophen (NORCO/VICODIN) 5-325 MG per tablet  For pt .  Marchelle Folksmanda states she  hasn't received the prescription back.

## 2015-05-31 NOTE — Telephone Encounter (Signed)
Did not know we could fax this. Don't we fix this. Thanks.

## 2015-06-01 NOTE — Telephone Encounter (Signed)
Totally confused. Thought this has to be hard copy now. Thanks.

## 2015-06-01 NOTE — Telephone Encounter (Signed)
Dr. Judie PetitM, do I just call in medication? Please advise. Thanks!

## 2015-06-02 DIAGNOSIS — F209 Schizophrenia, unspecified: Secondary | ICD-10-CM | POA: Diagnosis not present

## 2015-06-02 DIAGNOSIS — F419 Anxiety disorder, unspecified: Secondary | ICD-10-CM | POA: Diagnosis not present

## 2015-06-02 DIAGNOSIS — F329 Major depressive disorder, single episode, unspecified: Secondary | ICD-10-CM | POA: Diagnosis not present

## 2015-06-02 DIAGNOSIS — M6281 Muscle weakness (generalized): Secondary | ICD-10-CM | POA: Diagnosis not present

## 2015-06-02 DIAGNOSIS — F17210 Nicotine dependence, cigarettes, uncomplicated: Secondary | ICD-10-CM | POA: Diagnosis not present

## 2015-06-02 DIAGNOSIS — J449 Chronic obstructive pulmonary disease, unspecified: Secondary | ICD-10-CM | POA: Diagnosis not present

## 2015-06-07 DIAGNOSIS — M6281 Muscle weakness (generalized): Secondary | ICD-10-CM | POA: Diagnosis not present

## 2015-06-07 DIAGNOSIS — F419 Anxiety disorder, unspecified: Secondary | ICD-10-CM | POA: Diagnosis not present

## 2015-06-07 DIAGNOSIS — F329 Major depressive disorder, single episode, unspecified: Secondary | ICD-10-CM | POA: Diagnosis not present

## 2015-06-07 DIAGNOSIS — F17210 Nicotine dependence, cigarettes, uncomplicated: Secondary | ICD-10-CM | POA: Diagnosis not present

## 2015-06-07 DIAGNOSIS — J449 Chronic obstructive pulmonary disease, unspecified: Secondary | ICD-10-CM | POA: Diagnosis not present

## 2015-06-07 DIAGNOSIS — F209 Schizophrenia, unspecified: Secondary | ICD-10-CM | POA: Diagnosis not present

## 2015-06-10 DIAGNOSIS — F329 Major depressive disorder, single episode, unspecified: Secondary | ICD-10-CM | POA: Diagnosis not present

## 2015-06-10 DIAGNOSIS — M6281 Muscle weakness (generalized): Secondary | ICD-10-CM | POA: Diagnosis not present

## 2015-06-10 DIAGNOSIS — F209 Schizophrenia, unspecified: Secondary | ICD-10-CM | POA: Diagnosis not present

## 2015-06-10 DIAGNOSIS — F419 Anxiety disorder, unspecified: Secondary | ICD-10-CM | POA: Diagnosis not present

## 2015-06-10 DIAGNOSIS — J449 Chronic obstructive pulmonary disease, unspecified: Secondary | ICD-10-CM | POA: Diagnosis not present

## 2015-06-10 DIAGNOSIS — F17210 Nicotine dependence, cigarettes, uncomplicated: Secondary | ICD-10-CM | POA: Diagnosis not present

## 2015-06-14 DIAGNOSIS — F209 Schizophrenia, unspecified: Secondary | ICD-10-CM | POA: Diagnosis not present

## 2015-06-14 DIAGNOSIS — F17210 Nicotine dependence, cigarettes, uncomplicated: Secondary | ICD-10-CM | POA: Diagnosis not present

## 2015-06-14 DIAGNOSIS — J449 Chronic obstructive pulmonary disease, unspecified: Secondary | ICD-10-CM | POA: Diagnosis not present

## 2015-06-14 DIAGNOSIS — F329 Major depressive disorder, single episode, unspecified: Secondary | ICD-10-CM | POA: Diagnosis not present

## 2015-06-14 DIAGNOSIS — F419 Anxiety disorder, unspecified: Secondary | ICD-10-CM | POA: Diagnosis not present

## 2015-06-14 DIAGNOSIS — M6281 Muscle weakness (generalized): Secondary | ICD-10-CM | POA: Diagnosis not present

## 2015-06-16 DIAGNOSIS — F17210 Nicotine dependence, cigarettes, uncomplicated: Secondary | ICD-10-CM | POA: Diagnosis not present

## 2015-06-16 DIAGNOSIS — F419 Anxiety disorder, unspecified: Secondary | ICD-10-CM | POA: Diagnosis not present

## 2015-06-16 DIAGNOSIS — F329 Major depressive disorder, single episode, unspecified: Secondary | ICD-10-CM | POA: Diagnosis not present

## 2015-06-16 DIAGNOSIS — J449 Chronic obstructive pulmonary disease, unspecified: Secondary | ICD-10-CM | POA: Diagnosis not present

## 2015-06-16 DIAGNOSIS — F209 Schizophrenia, unspecified: Secondary | ICD-10-CM | POA: Diagnosis not present

## 2015-06-16 DIAGNOSIS — M6281 Muscle weakness (generalized): Secondary | ICD-10-CM | POA: Diagnosis not present

## 2015-06-17 ENCOUNTER — Other Ambulatory Visit: Payer: Self-pay | Admitting: Family Medicine

## 2015-06-20 ENCOUNTER — Other Ambulatory Visit: Payer: Self-pay | Admitting: Family Medicine

## 2015-06-20 DIAGNOSIS — M1990 Unspecified osteoarthritis, unspecified site: Secondary | ICD-10-CM

## 2015-06-20 MED ORDER — HYDROCODONE-ACETAMINOPHEN 5-325 MG PO TABS: 1.0000 | ORAL_TABLET | Freq: Four times a day (QID) | ORAL | Status: DC | PRN

## 2015-06-20 NOTE — Telephone Encounter (Signed)
Please notify [facility] that rx is ready. Thanks.

## 2015-06-20 NOTE — Telephone Encounter (Signed)
[facility]  Call[facility] needing a pres. Of  hydrocodone for Ms. Cornelius Moraswen.  Please call when ready  [phone]

## 2015-06-20 NOTE — Telephone Encounter (Signed)
Pt contacted office for refill request on the following medications: HYDROcodone-acetaminophen (NORCO/VICODIN) 5-325 MG per tablet. Pt called stated she needs this medication for hip and leg pain. The last RX was 10/04/14. Thanks TNP

## 2015-06-29 ENCOUNTER — Telehealth: Payer: Self-pay

## 2015-06-29 DIAGNOSIS — F209 Schizophrenia, unspecified: Secondary | ICD-10-CM | POA: Diagnosis not present

## 2015-06-29 NOTE — Telephone Encounter (Signed)
Spoke to [facility] at [facility]. Laqueta DueKaya reports that Diana Mccoy has not been to the ER and has not complained about any symptoms. Laqueta DueKaya reports that pt does smoke and does have a cough right after smoking. Laqueta DueKaya reports that no new concerns or worsening symptoms have been noted. They are aware that patient's nebulizer is broken and I asked that they please look on the nebulizer and call the company that provided [name] with the neb to see if they could fix it or get her a new one. Laqueta DueKaya agreed to taking care of that. sd

## 2015-06-29 NOTE — Telephone Encounter (Signed)
Patient called on 06/27/2015 and spoke to the RN on the answering service. Patient reported having fast heavy breathing and wheezing. Patient reported that her nebulizer is broken. Patient was advised to go to the ER. sd  LMTCB. sd

## 2015-07-01 DIAGNOSIS — F209 Schizophrenia, unspecified: Secondary | ICD-10-CM | POA: Diagnosis not present

## 2015-07-01 DIAGNOSIS — J449 Chronic obstructive pulmonary disease, unspecified: Secondary | ICD-10-CM | POA: Diagnosis not present

## 2015-07-01 DIAGNOSIS — F329 Major depressive disorder, single episode, unspecified: Secondary | ICD-10-CM | POA: Diagnosis not present

## 2015-07-01 DIAGNOSIS — F17210 Nicotine dependence, cigarettes, uncomplicated: Secondary | ICD-10-CM | POA: Diagnosis not present

## 2015-07-01 DIAGNOSIS — M6281 Muscle weakness (generalized): Secondary | ICD-10-CM | POA: Diagnosis not present

## 2015-07-01 DIAGNOSIS — F419 Anxiety disorder, unspecified: Secondary | ICD-10-CM | POA: Diagnosis not present

## 2015-07-06 DIAGNOSIS — F329 Major depressive disorder, single episode, unspecified: Secondary | ICD-10-CM | POA: Diagnosis not present

## 2015-07-06 DIAGNOSIS — M6281 Muscle weakness (generalized): Secondary | ICD-10-CM | POA: Diagnosis not present

## 2015-07-06 DIAGNOSIS — J449 Chronic obstructive pulmonary disease, unspecified: Secondary | ICD-10-CM | POA: Diagnosis not present

## 2015-07-06 DIAGNOSIS — F419 Anxiety disorder, unspecified: Secondary | ICD-10-CM | POA: Diagnosis not present

## 2015-07-06 DIAGNOSIS — F17210 Nicotine dependence, cigarettes, uncomplicated: Secondary | ICD-10-CM | POA: Diagnosis not present

## 2015-07-06 DIAGNOSIS — F209 Schizophrenia, unspecified: Secondary | ICD-10-CM | POA: Diagnosis not present

## 2015-07-12 ENCOUNTER — Encounter: Payer: Self-pay | Admitting: Family Medicine

## 2015-07-12 ENCOUNTER — Ambulatory Visit (INDEPENDENT_AMBULATORY_CARE_PROVIDER_SITE_OTHER): Payer: Medicare Other | Admitting: Family Medicine

## 2015-07-12 VITALS — BP 106/60 | HR 84 | Temp 98.0°F | Resp 16

## 2015-07-12 DIAGNOSIS — J029 Acute pharyngitis, unspecified: Secondary | ICD-10-CM | POA: Diagnosis not present

## 2015-07-12 DIAGNOSIS — J069 Acute upper respiratory infection, unspecified: Secondary | ICD-10-CM | POA: Diagnosis not present

## 2015-07-12 DIAGNOSIS — J441 Chronic obstructive pulmonary disease with (acute) exacerbation: Secondary | ICD-10-CM

## 2015-07-12 DIAGNOSIS — H9202 Otalgia, left ear: Secondary | ICD-10-CM | POA: Diagnosis not present

## 2015-07-12 LAB — POCT RAPID STREP A (OFFICE): Rapid Strep A Screen: NEGATIVE

## 2015-07-12 MED ORDER — NEOMYCIN-POLYMYXIN-HC 3.5-10000-1 OT SOLN: 3.0000 [drp] | Freq: Three times a day (TID) | OTIC | Status: DC

## 2015-07-12 MED ORDER — DOXYCYCLINE HYCLATE 100 MG PO TABS
100.0000 mg | ORAL_TABLET | Freq: Two times a day (BID) | ORAL | Status: DC
Start: 2015-07-12 — End: 2015-07-22

## 2015-07-12 NOTE — Progress Notes (Signed)
Patient ID: Diana Mccoy, female   DOB: 09/07/1945, 70 y.o.   MRN: [id number]         Patient: Diana Mccoy Female    DOB: October 19, 1945   70 y.o.   MRN: [id number] Visit Date: 07/12/2015  Today's Provider: Lorie Phenix, MD   Chief Complaint  Patient presents with  . URI  . Sore Throat   Subjective:    URI  This is a new problem. The current episode started in the past 7 days. There has been no fever. Associated symptoms include congestion, coughing, ear pain (Left ear pain), headaches, neck pain, a plugged ear sensation, sinus pain, a sore throat and wheezing. Pertinent negatives include no abdominal pain, diarrhea, nausea, rhinorrhea, sneezing or vomiting. She has tried acetaminophen for the symptoms. The treatment provided no relief.  Sore Throat  This is a new problem. The current episode started in the past 7 days. The problem has been unchanged. Neither side of throat is experiencing more pain than the other. There has been no fever. The pain is at a severity of 9/10. Associated symptoms include congestion, coughing, ear pain (Left ear pain), headaches, a plugged ear sensation, neck pain, shortness of breath and trouble swallowing. Pertinent negatives include no abdominal pain, diarrhea, ear discharge, stridor or vomiting. She has tried nothing for the symptoms.       Allergies  Allergen Reactions  . Ciprofloxacin Nausea And Vomiting  . Penicillins     rash, hives  . Sodium Thiosulfate   . Sulfa Antibiotics    Previous Medications   ACETAMINOPHEN (TYLENOL) 325 MG TABLET    Take 650 mg by mouth.   ALBUTEROL (PROVENTIL) (2.5 MG/3ML) 0.083% NEBULIZER SOLUTION    Take 3 mLs (2.5 mg total) by nebulization every 4 (four) hours as needed for wheezing or shortness of breath.   ALBUTEROL (VENTOLIN HFA) 108 (90 BASE) MCG/ACT INHALER    Inhale 2 puffs into the lungs every 4 (four) hours as needed.    ALENDRONATE (FOSAMAX) 70 MG TABLET    Take 70 mg by mouth once a week.    BUPROPION  (WELLBUTRIN SR) 150 MG 12 HR TABLET    Take 150 mg by mouth 2 (two) times daily.    CALCIUM CARB-CHOLECALCIFEROL (OYSTER SHELL CALCIUM PLUS D) 500-200 MG-UNIT TABS    Take 1 tablet by mouth 3 (three) times daily.    CLONAZEPAM (KLONOPIN) 0.5 MG TABLET    Take 1 tablet (0.5 mg total) by mouth 3 (three) times daily.   DOCUSATE SODIUM (COLACE) 100 MG CAPSULE    Take 100 mg by mouth 2 (two) times daily.   DOXYCYCLINE (VIBRA-TABS) 100 MG TABLET    Take 1 tablet (100 mg total) by mouth 2 (two) times daily.   ESOMEPRAZOLE (NEXIUM) 40 MG CAPSULE    Take 40 mg by mouth daily at 12 noon.    ESTRADIOL (ESTRACE VAGINAL) 0.1 MG/GM VAGINAL CREAM    Place vaginally 3 (three) times a week.    FERROUS SULFATE 325 (65 FE) MG TABLET    Take 325 mg by mouth 2 (two) times daily with a meal.    GLYCOPYRROLATE (ROBINUL) 2 MG TABLET    Take 2 mg by mouth 3 (three) times daily.    HYDROCODONE-ACETAMINOPHEN (NORCO/VICODIN) 5-325 MG TABLET    Take 1 tablet by mouth every 6 (six) hours as needed.   LORATADINE (CLARITIN) 10 MG TABLET    Take 10 mg by mouth daily.    MAGNESIUM  HYDROXIDE (MILK OF MAGNESIA CONCENTRATE) 2400 MG/10ML SUSP    Take 30 mLs by mouth.    NYSTATIN OINTMENT (MYCOSTATIN)    NYSTATIN, 100000 UNIT/GM (External Ointment)  1 Ointment Ointment Apply to affected area twice a day for 0 days  Quantity: 60;  Refills: 0   Ordered :14-Aug-2013  Lorie Phenix MD;  Started 14-Aug-2013 Active   PALIPERIDONE (INVEGA SUSTENNA) 156 MG/ML SUSP INJECTION    Inject 156 mg into the muscle every 30 (thirty) days.   PALIPERIDONE (INVEGA) 6 MG 24 HR TABLET    Take 6 mg by mouth at bedtime.   TRAZODONE (DESYREL) 100 MG TABLET    Take 100 mg by mouth at bedtime.   TRIMETHOPRIM (TRIMPEX) 100 MG TABLET    TAKE ONE TABLET BY MOUTH EVERY DAY   VITAMIN D, ERGOCALCIFEROL, (DRISDOL) 50000 UNITS CAPS CAPSULE    Take 50,000 Units by mouth every 30 (thirty) days.    ZONISAMIDE (ZONEGRAN) 100 MG CAPSULE    Take 200 mg by mouth daily.       Review of Systems  Constitutional: Positive for fatigue. Negative for fever, chills, diaphoresis, activity change, appetite change and unexpected weight change.  HENT: Positive for congestion, ear pain (Left ear pain), sinus pressure, sore throat and trouble swallowing. Negative for ear discharge, nosebleeds, postnasal drip, rhinorrhea, sneezing and voice change.   Eyes: Positive for pain. Negative for photophobia, discharge, redness, itching and visual disturbance.  Respiratory: Positive for cough, shortness of breath and wheezing. Negative for apnea, choking, chest tightness and stridor.        History of smoking.   Gastrointestinal: Negative for nausea, vomiting, abdominal pain, diarrhea, constipation, blood in stool, abdominal distention, anal bleeding and rectal pain.  Musculoskeletal: Positive for neck pain.  Neurological: Positive for headaches. Negative for dizziness and light-headedness.    Social History  Substance Use Topics  . Smoking status: Current Every Day Smoker -- 1.00 packs/day for 30 years  . Smokeless tobacco: Never Used  . Alcohol Use: No   Objective:   BP 106/60 mmHg  Pulse 84  Temp(Src) 98 F (36.7 C)  Resp 16  Physical Exam  Constitutional: She appears well-developed and well-nourished.  HENT:  Head: Normocephalic and atraumatic.  Right Ear: Tympanic membrane, external ear and ear canal normal.  Left Ear: Tympanic membrane and ear canal normal. There is swelling and tenderness.  Nose: Nose normal.  Mouth/Throat: Uvula is midline, oropharynx is clear and moist and mucous membranes are normal.  Cardiovascular: Normal rate, regular rhythm and normal heart sounds.   Pulmonary/Chest: Effort normal. She has wheezes.  Inspiratory wheeze and prolonged expiratory phase.    Results for orders placed or performed in visit on 07/12/15  POCT rapid strep A  Result Value Ref Range   Rapid Strep A Screen Negative Negative        Assessment & Plan:       1. Upper respiratory infection Could be viral, but will treat secondary to COPD.  - doxycycline (VIBRA-TABS) 100 MG tablet; Take 1 tablet (100 mg total) by mouth 2 (two) times daily. For seven days.  Dispense: 14 tablet; Refill: 0  2. Sore throat Strep negative. Likely viral or bronchitis.  - POCT rapid strep A  3. Left ear pain Worsening will start an ear drop.  Call if worsening or not improved.  - neomycin-polymyxin-hydrocortisone (CORTISPORIN) otic solution; Place 3 drops into the left ear 3 (three) times daily. For seven days.  Dispense: 10 mL; Refill: 0  4. COPD exacerbation (HCC) Will start Doxy as below.  Follow up next week to get a flu and pneumonia vaccine.  Patient instructed to call back if condition worsens or does not improve and may need prednisone.   - doxycycline (VIBRA-TABS) 100 MG tablet; Take 1 tablet (100 mg total) by mouth 2 (two) times daily. For seven days.  Dispense: 14 tablet; Refill: 0     Patient was seen and examined by Leo GrosserNancy J. Shakira Los, MD, and note scribed by Kavin LeechLaura Walsh, CMA.   I have reviewed the document for accuracy and completeness and I agree with above. - Leo GrosserNancy J. Mystique Bjelland, MD   Lorie PhenixNancy Eashan Schipani, MD  [facility]

## 2015-07-13 ENCOUNTER — Telehealth: Payer: Self-pay | Admitting: Family Medicine

## 2015-07-13 NOTE — Telephone Encounter (Signed)
Pt called wanting to speak to Dr. Elease HashimotoMaloney.  She would not state why.  She is in a facility [facility]".   This is the only information I could get.  She said she did not want to speak to a nurse.   The call back number there is [phone]  Thanks, Barth Kirksteri

## 2015-07-13 NOTE — Telephone Encounter (Signed)
Concerned about antibiotic. Twice a day is fine. Thanks.

## 2015-07-14 DIAGNOSIS — F209 Schizophrenia, unspecified: Secondary | ICD-10-CM | POA: Diagnosis not present

## 2015-07-14 DIAGNOSIS — J449 Chronic obstructive pulmonary disease, unspecified: Secondary | ICD-10-CM | POA: Diagnosis not present

## 2015-07-14 DIAGNOSIS — M6281 Muscle weakness (generalized): Secondary | ICD-10-CM | POA: Diagnosis not present

## 2015-07-14 DIAGNOSIS — F329 Major depressive disorder, single episode, unspecified: Secondary | ICD-10-CM | POA: Diagnosis not present

## 2015-07-14 DIAGNOSIS — F419 Anxiety disorder, unspecified: Secondary | ICD-10-CM | POA: Diagnosis not present

## 2015-07-14 DIAGNOSIS — F17210 Nicotine dependence, cigarettes, uncomplicated: Secondary | ICD-10-CM | POA: Diagnosis not present

## 2015-07-17 DIAGNOSIS — F329 Major depressive disorder, single episode, unspecified: Secondary | ICD-10-CM | POA: Diagnosis not present

## 2015-07-17 DIAGNOSIS — F209 Schizophrenia, unspecified: Secondary | ICD-10-CM | POA: Diagnosis not present

## 2015-07-17 DIAGNOSIS — M6281 Muscle weakness (generalized): Secondary | ICD-10-CM | POA: Diagnosis not present

## 2015-07-17 DIAGNOSIS — R2689 Other abnormalities of gait and mobility: Secondary | ICD-10-CM | POA: Diagnosis not present

## 2015-07-17 DIAGNOSIS — F419 Anxiety disorder, unspecified: Secondary | ICD-10-CM | POA: Diagnosis not present

## 2015-07-17 DIAGNOSIS — F17210 Nicotine dependence, cigarettes, uncomplicated: Secondary | ICD-10-CM | POA: Diagnosis not present

## 2015-07-17 DIAGNOSIS — J449 Chronic obstructive pulmonary disease, unspecified: Secondary | ICD-10-CM | POA: Diagnosis not present

## 2015-07-18 ENCOUNTER — Other Ambulatory Visit: Payer: Self-pay | Admitting: Family Medicine

## 2015-07-18 ENCOUNTER — Telehealth: Payer: Self-pay

## 2015-07-18 DIAGNOSIS — M1990 Unspecified osteoarthritis, unspecified site: Secondary | ICD-10-CM

## 2015-07-18 MED ORDER — HYDROCODONE-ACETAMINOPHEN 5-325 MG PO TABS: 1.0000 | ORAL_TABLET | Freq: Four times a day (QID) | ORAL | Status: DC | PRN

## 2015-07-18 NOTE — Telephone Encounter (Signed)
Faxed [facility] Norco prescription per Lynn's request.

## 2015-07-18 NOTE — Telephone Encounter (Signed)
Pt needs refill HYDROcodone-acetaminophen (NORCO/VICODIN) 5-325 MG tablet  Call when ready to be picked up  Thanks Barth Kirkseri

## 2015-07-18 NOTE — Telephone Encounter (Signed)
Pt request a call back at [phone] when the Rx is ready/MW

## 2015-07-18 NOTE — Telephone Encounter (Signed)
Prescription printed. Please notify patient it is ready for pick up. Thanks- Dr. Wymon Swaney.  

## 2015-07-20 DIAGNOSIS — M6281 Muscle weakness (generalized): Secondary | ICD-10-CM | POA: Diagnosis not present

## 2015-07-20 DIAGNOSIS — F419 Anxiety disorder, unspecified: Secondary | ICD-10-CM | POA: Diagnosis not present

## 2015-07-20 DIAGNOSIS — F209 Schizophrenia, unspecified: Secondary | ICD-10-CM | POA: Diagnosis not present

## 2015-07-20 DIAGNOSIS — F329 Major depressive disorder, single episode, unspecified: Secondary | ICD-10-CM | POA: Diagnosis not present

## 2015-07-20 DIAGNOSIS — R2689 Other abnormalities of gait and mobility: Secondary | ICD-10-CM | POA: Diagnosis not present

## 2015-07-20 DIAGNOSIS — J449 Chronic obstructive pulmonary disease, unspecified: Secondary | ICD-10-CM | POA: Diagnosis not present

## 2015-07-21 DIAGNOSIS — F329 Major depressive disorder, single episode, unspecified: Secondary | ICD-10-CM | POA: Diagnosis not present

## 2015-07-21 DIAGNOSIS — M6281 Muscle weakness (generalized): Secondary | ICD-10-CM | POA: Diagnosis not present

## 2015-07-21 DIAGNOSIS — J449 Chronic obstructive pulmonary disease, unspecified: Secondary | ICD-10-CM | POA: Diagnosis not present

## 2015-07-21 DIAGNOSIS — F419 Anxiety disorder, unspecified: Secondary | ICD-10-CM | POA: Diagnosis not present

## 2015-07-21 DIAGNOSIS — F209 Schizophrenia, unspecified: Secondary | ICD-10-CM | POA: Diagnosis not present

## 2015-07-21 DIAGNOSIS — R2689 Other abnormalities of gait and mobility: Secondary | ICD-10-CM | POA: Diagnosis not present

## 2015-07-22 ENCOUNTER — Encounter: Payer: Self-pay | Admitting: Family Medicine

## 2015-07-22 ENCOUNTER — Ambulatory Visit (INDEPENDENT_AMBULATORY_CARE_PROVIDER_SITE_OTHER): Payer: Medicare Other | Admitting: Family Medicine

## 2015-07-22 VITALS — BP 126/66 | HR 88 | Temp 97.6°F | Resp 20 | Wt 170.0 lb

## 2015-07-22 DIAGNOSIS — Z23 Encounter for immunization: Secondary | ICD-10-CM | POA: Diagnosis not present

## 2015-07-22 DIAGNOSIS — M1990 Unspecified osteoarthritis, unspecified site: Secondary | ICD-10-CM | POA: Diagnosis not present

## 2015-07-22 DIAGNOSIS — J441 Chronic obstructive pulmonary disease with (acute) exacerbation: Secondary | ICD-10-CM

## 2015-07-22 MED ORDER — HYDROCODONE-ACETAMINOPHEN 5-325 MG PO TABS: 1.0000 | ORAL_TABLET | Freq: Four times a day (QID) | ORAL | Status: DC | PRN

## 2015-07-22 NOTE — Progress Notes (Signed)
Patient ID: Diana Mccoy, female   DOB: October 11, 1945, 70 y.o.   MRN: [id number]       Patient: Diana Mccoy Female    DOB: 11/18/1945   70 y.o.   MRN: [id number] Visit Date: 07/22/2015  Today's Provider: Lorie Phenix, MD   Chief Complaint  Patient presents with  . COPD    follow-up   Subjective:    HPI  COPD, Follow up:  She was last seen for this 1 weeks ago. Changes made include started doxycycline.   She reports excellent compliance with treatment. She is not having side effects.  she is/isnot using rescue inhaler/nebulizer. Typically 3 per days. She IS experiencing cough and wheezing. She is NOT experiencing fever. she report breathing is Unchanged.  ------------------------------------------------------------------------     Allergies  Allergen Reactions  . Ciprofloxacin Nausea And Vomiting  . Penicillins     rash, hives  . Sodium Thiosulfate   . Sulfa Antibiotics    Previous Medications   ACETAMINOPHEN (TYLENOL) 325 MG TABLET    Take 650 mg by mouth.   ALBUTEROL (PROVENTIL) (2.5 MG/3ML) 0.083% NEBULIZER SOLUTION    Take 3 mLs (2.5 mg total) by nebulization every 4 (four) hours as needed for wheezing or shortness of breath.   ALBUTEROL (VENTOLIN HFA) 108 (90 BASE) MCG/ACT INHALER    Inhale 2 puffs into the lungs every 4 (four) hours as needed.    ALENDRONATE (FOSAMAX) 70 MG TABLET    Take 70 mg by mouth once a week.    BUPROPION (WELLBUTRIN SR) 150 MG 12 HR TABLET    Take 150 mg by mouth 2 (two) times daily.    CALCIUM CARB-CHOLECALCIFEROL (OYSTER SHELL CALCIUM PLUS D) 500-200 MG-UNIT TABS    Take 1 tablet by mouth 3 (three) times daily.    CLONAZEPAM (KLONOPIN) 0.5 MG TABLET    Take 1 tablet (0.5 mg total) by mouth 3 (three) times daily.   DOCUSATE SODIUM (COLACE) 100 MG CAPSULE    Take 100 mg by mouth 2 (two) times daily.   ESOMEPRAZOLE (NEXIUM) 40 MG CAPSULE    Take 40 mg by mouth daily at 12 noon.    ESTRADIOL (ESTRACE VAGINAL) 0.1 MG/GM VAGINAL CREAM    Place  vaginally 3 (three) times a week.    FERROUS SULFATE 325 (65 FE) MG TABLET    Take 325 mg by mouth 2 (two) times daily with a meal.    GLYCOPYRROLATE (ROBINUL) 2 MG TABLET    Take 2 mg by mouth 3 (three) times daily.    HYDROCODONE-ACETAMINOPHEN (NORCO/VICODIN) 5-325 MG TABLET    Take 1 tablet by mouth every 6 (six) hours as needed.   LORATADINE (CLARITIN) 10 MG TABLET    Take 10 mg by mouth daily.    MAGNESIUM HYDROXIDE (MILK OF MAGNESIA CONCENTRATE) 2400 MG/10ML SUSP    Take 30 mLs by mouth.    NYSTATIN OINTMENT (MYCOSTATIN)    NYSTATIN, 100000 UNIT/GM (External Ointment)  1 Ointment Ointment Apply to affected area twice a day for 0 days  Quantity: 60;  Refills: 0   Ordered :14-Aug-2013  Lorie Phenix MD;  Started 14-Aug-2013 Active   PALIPERIDONE (INVEGA SUSTENNA) 156 MG/ML SUSP INJECTION    Inject 156 mg into the muscle every 30 (thirty) days.   PALIPERIDONE (INVEGA) 6 MG 24 HR TABLET    Take 6 mg by mouth at bedtime.   TRIMETHOPRIM (TRIMPEX) 100 MG TABLET    TAKE ONE TABLET BY MOUTH EVERY DAY   VITAMIN  D, ERGOCALCIFEROL, (DRISDOL) 50000 UNITS CAPS CAPSULE    Take 50,000 Units by mouth every 30 (thirty) days.    ZONISAMIDE (ZONEGRAN) 100 MG CAPSULE    Take 200 mg by mouth daily.     Review of Systems  Constitutional: Negative.   Respiratory: Positive for cough, shortness of breath and wheezing.   Cardiovascular: Negative.     Social History  Substance Use Topics  . Smoking status: Current Every Day Smoker -- 1.00 packs/day for 30 years  . Smokeless tobacco: Never Used  . Alcohol Use: No   Objective:   BP 126/66 mmHg  Pulse 88  Temp(Src) 97.6 F (36.4 C)  Resp 20  Wt 170 lb (77.111 kg)  SpO2 95%  Physical Exam  Constitutional: She is oriented to person, place, and time. She appears well-developed and well-nourished.  HENT:  Head: Normocephalic and atraumatic.  Right Ear: External ear normal.  Left Ear: External ear normal.  Nose: Nose normal.  Mouth/Throat:  Oropharynx is clear and moist.  Eyes: Conjunctivae and EOM are normal. Pupils are equal, round, and reactive to light.  Neck: Normal range of motion. Neck supple.  Cardiovascular: Normal rate and regular rhythm.   Pulmonary/Chest: Effort normal and breath sounds normal.  Scattered course breath sounds.   Neurological: She is alert and oriented to person, place, and time.  Psychiatric: She has a normal mood and affect.      Assessment & Plan:     1. COPD exacerbation (HCC) Resolved. Patient still smoking. Continue medication for COPD.  Not likely to quit smoking secondary to significant psychiatric diagnosis.   2. Need for influenza vaccination Given today.   - Flu Vaccine QUAD 36+ mos IM  3. Need for pneumococcal vaccination Given today.  - Pneumococcal conjugate vaccine 13-valent IM  4. Arthritis Refilled her medication today.   - HYDROcodone-acetaminophen (NORCO/VICODIN) 5-325 MG tablet; Take 1 tablet by mouth every 6 (six) hours as needed.  Dispense: 90 tablet; Refill: 0   Patient was seen and examined by Leo Grosser, MD, and note scribed by Rondel Baton, CMA.   I have reviewed the document for accuracy and completeness and I agree with above. - Leo Grosser, MD     Lorie Phenix, MD  [facility]

## 2015-07-25 DIAGNOSIS — M6281 Muscle weakness (generalized): Secondary | ICD-10-CM | POA: Diagnosis not present

## 2015-07-25 DIAGNOSIS — R2689 Other abnormalities of gait and mobility: Secondary | ICD-10-CM | POA: Diagnosis not present

## 2015-07-25 DIAGNOSIS — F209 Schizophrenia, unspecified: Secondary | ICD-10-CM | POA: Diagnosis not present

## 2015-07-25 DIAGNOSIS — F329 Major depressive disorder, single episode, unspecified: Secondary | ICD-10-CM | POA: Diagnosis not present

## 2015-07-25 DIAGNOSIS — F419 Anxiety disorder, unspecified: Secondary | ICD-10-CM | POA: Diagnosis not present

## 2015-07-25 DIAGNOSIS — J449 Chronic obstructive pulmonary disease, unspecified: Secondary | ICD-10-CM | POA: Diagnosis not present

## 2015-07-27 DIAGNOSIS — F418 Other specified anxiety disorders: Secondary | ICD-10-CM | POA: Diagnosis not present

## 2015-07-27 DIAGNOSIS — F209 Schizophrenia, unspecified: Secondary | ICD-10-CM | POA: Diagnosis not present

## 2015-07-27 DIAGNOSIS — F329 Major depressive disorder, single episode, unspecified: Secondary | ICD-10-CM | POA: Diagnosis not present

## 2015-07-29 DIAGNOSIS — M6281 Muscle weakness (generalized): Secondary | ICD-10-CM | POA: Diagnosis not present

## 2015-07-29 DIAGNOSIS — R2689 Other abnormalities of gait and mobility: Secondary | ICD-10-CM | POA: Diagnosis not present

## 2015-07-29 DIAGNOSIS — F419 Anxiety disorder, unspecified: Secondary | ICD-10-CM | POA: Diagnosis not present

## 2015-07-29 DIAGNOSIS — F209 Schizophrenia, unspecified: Secondary | ICD-10-CM | POA: Diagnosis not present

## 2015-07-29 DIAGNOSIS — F329 Major depressive disorder, single episode, unspecified: Secondary | ICD-10-CM | POA: Diagnosis not present

## 2015-07-29 DIAGNOSIS — J449 Chronic obstructive pulmonary disease, unspecified: Secondary | ICD-10-CM | POA: Diagnosis not present

## 2015-08-01 ENCOUNTER — Ambulatory Visit: Payer: Self-pay | Admitting: Family Medicine

## 2015-08-01 ENCOUNTER — Encounter (INDEPENDENT_AMBULATORY_CARE_PROVIDER_SITE_OTHER): Payer: Medicare Other | Admitting: Family Medicine

## 2015-08-01 ENCOUNTER — Telehealth: Payer: Self-pay | Admitting: Family Medicine

## 2015-08-01 DIAGNOSIS — J449 Chronic obstructive pulmonary disease, unspecified: Secondary | ICD-10-CM | POA: Diagnosis not present

## 2015-08-01 DIAGNOSIS — F329 Major depressive disorder, single episode, unspecified: Secondary | ICD-10-CM

## 2015-08-01 DIAGNOSIS — M6281 Muscle weakness (generalized): Secondary | ICD-10-CM | POA: Diagnosis not present

## 2015-08-01 DIAGNOSIS — F419 Anxiety disorder, unspecified: Secondary | ICD-10-CM | POA: Diagnosis not present

## 2015-08-01 NOTE — Telephone Encounter (Signed)
Melanie with [employer] stating she had sent over a form on the pt back in December and hasn't received the forms back, she states she was making a follow up call about the forms,Melanie states she will re-fax the form to [place]. Thanks CC

## 2015-08-02 ENCOUNTER — Ambulatory Visit: Payer: Medicare Other | Admitting: Family Medicine

## 2015-08-03 ENCOUNTER — Telehealth: Payer: Self-pay | Admitting: Family Medicine

## 2015-08-03 DIAGNOSIS — F329 Major depressive disorder, single episode, unspecified: Secondary | ICD-10-CM | POA: Diagnosis not present

## 2015-08-03 DIAGNOSIS — J449 Chronic obstructive pulmonary disease, unspecified: Secondary | ICD-10-CM | POA: Diagnosis not present

## 2015-08-03 DIAGNOSIS — F419 Anxiety disorder, unspecified: Secondary | ICD-10-CM | POA: Diagnosis not present

## 2015-08-03 DIAGNOSIS — M6281 Muscle weakness (generalized): Secondary | ICD-10-CM | POA: Diagnosis not present

## 2015-08-03 DIAGNOSIS — F209 Schizophrenia, unspecified: Secondary | ICD-10-CM | POA: Diagnosis not present

## 2015-08-03 DIAGNOSIS — R2689 Other abnormalities of gait and mobility: Secondary | ICD-10-CM | POA: Diagnosis not present

## 2015-08-03 NOTE — Telephone Encounter (Signed)
Have you seen this?-aa 

## 2015-08-03 NOTE — Telephone Encounter (Signed)
Tried calling number listed below twice and it was busy. Called number listed in chart and currently not available. Will try again later.

## 2015-08-03 NOTE — Telephone Encounter (Signed)
These are the forms for adult diapers.  Pt reports that she did not need these.   Thanks,   -Vernona Rieger

## 2015-08-03 NOTE — Telephone Encounter (Signed)
What are forms for, Thanks.

## 2015-08-03 NOTE — Telephone Encounter (Signed)
Pt wants to talk to some one about her HYDROcodone-acetaminophen (NORCO/VICODIN) 5-325 MG tablet  She ask for an increase in the prescription.    Her call back is [phone]  Thanks Barth Kirks

## 2015-08-04 DIAGNOSIS — R2689 Other abnormalities of gait and mobility: Secondary | ICD-10-CM | POA: Diagnosis not present

## 2015-08-04 DIAGNOSIS — M6281 Muscle weakness (generalized): Secondary | ICD-10-CM | POA: Diagnosis not present

## 2015-08-04 DIAGNOSIS — F209 Schizophrenia, unspecified: Secondary | ICD-10-CM | POA: Diagnosis not present

## 2015-08-04 DIAGNOSIS — J449 Chronic obstructive pulmonary disease, unspecified: Secondary | ICD-10-CM | POA: Diagnosis not present

## 2015-08-04 DIAGNOSIS — F419 Anxiety disorder, unspecified: Secondary | ICD-10-CM | POA: Diagnosis not present

## 2015-08-04 DIAGNOSIS — F329 Major depressive disorder, single episode, unspecified: Secondary | ICD-10-CM | POA: Diagnosis not present

## 2015-08-08 ENCOUNTER — Other Ambulatory Visit: Payer: Self-pay | Admitting: Family Medicine

## 2015-08-08 DIAGNOSIS — F419 Anxiety disorder, unspecified: Secondary | ICD-10-CM

## 2015-08-08 MED ORDER — CLONAZEPAM 0.5 MG PO TABS: 0.5000 mg | ORAL_TABLET | Freq: Three times a day (TID) | ORAL | Status: DC

## 2015-08-08 NOTE — Telephone Encounter (Signed)
Printed, please fax or call in to pharmacy. Thank you.   

## 2015-08-08 NOTE — Telephone Encounter (Signed)
Diana Mccoy with [facility] called in refill request for clonazePAM (KLONOPIN) 0.5 MG tablet. Diana Mccoy request a call when the RX is ready for pick up. Thanks TNP

## 2015-08-11 DIAGNOSIS — F419 Anxiety disorder, unspecified: Secondary | ICD-10-CM | POA: Diagnosis not present

## 2015-08-11 DIAGNOSIS — R2689 Other abnormalities of gait and mobility: Secondary | ICD-10-CM | POA: Diagnosis not present

## 2015-08-11 DIAGNOSIS — F209 Schizophrenia, unspecified: Secondary | ICD-10-CM | POA: Diagnosis not present

## 2015-08-11 DIAGNOSIS — J449 Chronic obstructive pulmonary disease, unspecified: Secondary | ICD-10-CM | POA: Diagnosis not present

## 2015-08-11 DIAGNOSIS — F329 Major depressive disorder, single episode, unspecified: Secondary | ICD-10-CM | POA: Diagnosis not present

## 2015-08-11 DIAGNOSIS — M6281 Muscle weakness (generalized): Secondary | ICD-10-CM | POA: Diagnosis not present

## 2015-08-12 DIAGNOSIS — M6281 Muscle weakness (generalized): Secondary | ICD-10-CM | POA: Diagnosis not present

## 2015-08-12 DIAGNOSIS — F209 Schizophrenia, unspecified: Secondary | ICD-10-CM | POA: Diagnosis not present

## 2015-08-12 DIAGNOSIS — J449 Chronic obstructive pulmonary disease, unspecified: Secondary | ICD-10-CM | POA: Diagnosis not present

## 2015-08-12 DIAGNOSIS — F329 Major depressive disorder, single episode, unspecified: Secondary | ICD-10-CM | POA: Diagnosis not present

## 2015-08-12 DIAGNOSIS — F419 Anxiety disorder, unspecified: Secondary | ICD-10-CM | POA: Diagnosis not present

## 2015-08-12 DIAGNOSIS — R2689 Other abnormalities of gait and mobility: Secondary | ICD-10-CM | POA: Diagnosis not present

## 2015-08-16 DIAGNOSIS — F209 Schizophrenia, unspecified: Secondary | ICD-10-CM | POA: Diagnosis not present

## 2015-08-16 DIAGNOSIS — F329 Major depressive disorder, single episode, unspecified: Secondary | ICD-10-CM | POA: Diagnosis not present

## 2015-08-16 DIAGNOSIS — J449 Chronic obstructive pulmonary disease, unspecified: Secondary | ICD-10-CM | POA: Diagnosis not present

## 2015-08-16 DIAGNOSIS — R2689 Other abnormalities of gait and mobility: Secondary | ICD-10-CM | POA: Diagnosis not present

## 2015-08-16 DIAGNOSIS — M6281 Muscle weakness (generalized): Secondary | ICD-10-CM | POA: Diagnosis not present

## 2015-08-16 DIAGNOSIS — F419 Anxiety disorder, unspecified: Secondary | ICD-10-CM | POA: Diagnosis not present

## 2015-08-19 DIAGNOSIS — M6281 Muscle weakness (generalized): Secondary | ICD-10-CM | POA: Diagnosis not present

## 2015-08-19 DIAGNOSIS — F419 Anxiety disorder, unspecified: Secondary | ICD-10-CM | POA: Diagnosis not present

## 2015-08-19 DIAGNOSIS — F329 Major depressive disorder, single episode, unspecified: Secondary | ICD-10-CM | POA: Diagnosis not present

## 2015-08-19 DIAGNOSIS — J449 Chronic obstructive pulmonary disease, unspecified: Secondary | ICD-10-CM | POA: Diagnosis not present

## 2015-08-19 DIAGNOSIS — F209 Schizophrenia, unspecified: Secondary | ICD-10-CM | POA: Diagnosis not present

## 2015-08-19 DIAGNOSIS — R2689 Other abnormalities of gait and mobility: Secondary | ICD-10-CM | POA: Diagnosis not present

## 2015-08-23 DIAGNOSIS — R2689 Other abnormalities of gait and mobility: Secondary | ICD-10-CM | POA: Diagnosis not present

## 2015-08-23 DIAGNOSIS — F209 Schizophrenia, unspecified: Secondary | ICD-10-CM | POA: Diagnosis not present

## 2015-08-23 DIAGNOSIS — F419 Anxiety disorder, unspecified: Secondary | ICD-10-CM | POA: Diagnosis not present

## 2015-08-23 DIAGNOSIS — J449 Chronic obstructive pulmonary disease, unspecified: Secondary | ICD-10-CM | POA: Diagnosis not present

## 2015-08-23 DIAGNOSIS — F329 Major depressive disorder, single episode, unspecified: Secondary | ICD-10-CM | POA: Diagnosis not present

## 2015-08-23 DIAGNOSIS — M6281 Muscle weakness (generalized): Secondary | ICD-10-CM | POA: Diagnosis not present

## 2015-08-24 DIAGNOSIS — F418 Other specified anxiety disorders: Secondary | ICD-10-CM | POA: Diagnosis not present

## 2015-08-24 DIAGNOSIS — F209 Schizophrenia, unspecified: Secondary | ICD-10-CM | POA: Diagnosis not present

## 2015-08-24 DIAGNOSIS — F329 Major depressive disorder, single episode, unspecified: Secondary | ICD-10-CM | POA: Diagnosis not present

## 2015-08-25 DIAGNOSIS — F419 Anxiety disorder, unspecified: Secondary | ICD-10-CM | POA: Diagnosis not present

## 2015-08-25 DIAGNOSIS — J449 Chronic obstructive pulmonary disease, unspecified: Secondary | ICD-10-CM | POA: Diagnosis not present

## 2015-08-25 DIAGNOSIS — F329 Major depressive disorder, single episode, unspecified: Secondary | ICD-10-CM | POA: Diagnosis not present

## 2015-08-25 DIAGNOSIS — R2689 Other abnormalities of gait and mobility: Secondary | ICD-10-CM | POA: Diagnosis not present

## 2015-08-25 DIAGNOSIS — F209 Schizophrenia, unspecified: Secondary | ICD-10-CM | POA: Diagnosis not present

## 2015-08-25 DIAGNOSIS — M6281 Muscle weakness (generalized): Secondary | ICD-10-CM | POA: Diagnosis not present

## 2015-08-29 ENCOUNTER — Other Ambulatory Visit: Payer: Self-pay | Admitting: Family Medicine

## 2015-08-29 DIAGNOSIS — M1990 Unspecified osteoarthritis, unspecified site: Secondary | ICD-10-CM

## 2015-08-29 MED ORDER — HYDROCODONE-ACETAMINOPHEN 5-325 MG PO TABS: 1.0000 | ORAL_TABLET | Freq: Four times a day (QID) | ORAL | Status: DC | PRN

## 2015-08-29 NOTE — Telephone Encounter (Signed)
Printed.  Please notify facility it is ready for pick up. Thanks.

## 2015-08-29 NOTE — Telephone Encounter (Signed)
Pt contacted office for refill request on the following medications: HYDROcodone-acetaminophen (NORCO/VICODIN) 5-325 MG tablet. Pt stated she is out of the medication. Thanks TNP

## 2015-09-05 ENCOUNTER — Telehealth: Payer: Self-pay | Admitting: Family Medicine

## 2015-09-05 NOTE — Telephone Encounter (Signed)
Pt's daughter Nicholos JohnsKathleen would like the orders for pt's HHYDROcodone-acetaminophen (NORCO/VICODIN) 5-325 MG tablet to be taken at certain times of the day or some way so that pt doesn't have to ask for the medication. Thanks TNP YDROcodone-acetaminophen (NORCO/VICODIN) 5-325 MG tabletHYHYDROcodone-acetaminophen (NORCO/VICODIN) 5-325 MG tabletDROcodone-acetaminophen (NORCO/VICODIN) 5-325 MG tabletHYDROcodone-acetaminophen (NORCO/VICODIN) 5-325 MG tablet

## 2015-09-05 NOTE — Telephone Encounter (Signed)
Patient's daughter reports that Mrs. Diana Mccoy is asking for pain medications before the 6 hours. Mrs. Nicholos JohnsKathleen reports that she is not sure how often Mrs. Diana Mccoy is asking for the medication. Reports that facility has mentioned that mrs Damiani is asking for the medication more than usual. sd

## 2015-09-05 NOTE — Telephone Encounter (Signed)
Talked with daughter. Recommend ov to evaluate and treat. Thanks.

## 2015-09-23 ENCOUNTER — Other Ambulatory Visit: Payer: Self-pay | Admitting: Family Medicine

## 2015-09-23 DIAGNOSIS — M1990 Unspecified osteoarthritis, unspecified site: Secondary | ICD-10-CM

## 2015-09-23 MED ORDER — HYDROCODONE-ACETAMINOPHEN 5-325 MG PO TABS: 1.0000 | ORAL_TABLET | Freq: Four times a day (QID) | ORAL | Status: DC | PRN

## 2015-09-23 NOTE — Telephone Encounter (Signed)
Pt needs refill HYDROcodone-acetaminophen (NORCO/VICODIN) 5-325 MG tablet  Please call when ready.  Thanks, Fortune Brandsteri

## 2015-09-23 NOTE — Telephone Encounter (Signed)
Printed, please fax or call in to pharmacy. Thank you.   

## 2015-10-13 ENCOUNTER — Other Ambulatory Visit: Payer: Self-pay | Admitting: Family Medicine

## 2015-10-13 DIAGNOSIS — K219 Gastro-esophageal reflux disease without esophagitis: Secondary | ICD-10-CM

## 2015-10-13 DIAGNOSIS — M1990 Unspecified osteoarthritis, unspecified site: Secondary | ICD-10-CM

## 2015-10-13 MED ORDER — ESOMEPRAZOLE MAGNESIUM 40 MG PO CPDR: 40.0000 mg | DELAYED_RELEASE_CAPSULE | Freq: Every day | ORAL | Status: DC

## 2015-10-13 MED ORDER — HYDROCODONE-ACETAMINOPHEN 5-325 MG PO TABS: 1.0000 | ORAL_TABLET | Freq: Four times a day (QID) | ORAL | Status: DC | PRN

## 2015-10-13 NOTE — Telephone Encounter (Signed)
Patient called office requesting a refill on her Hydrocodone-Acetaminophen 5-325mg . Patient was last prescribed medication on 09/23/15 and states that she only has a few pills left. Patient would like a call back when prescription is available for pick up. KW

## 2015-10-13 NOTE — Telephone Encounter (Signed)
Prescription printed. Please notify patient it is ready for pick up. Thanks- Dr. Raven Harmes.  

## 2015-10-20 ENCOUNTER — Ambulatory Visit (INDEPENDENT_AMBULATORY_CARE_PROVIDER_SITE_OTHER): Payer: Medicare Other | Admitting: Family Medicine

## 2015-10-20 VITALS — BP 122/68 | HR 80 | Temp 97.9°F | Resp 16

## 2015-10-20 DIAGNOSIS — J441 Chronic obstructive pulmonary disease with (acute) exacerbation: Secondary | ICD-10-CM | POA: Diagnosis not present

## 2015-10-20 DIAGNOSIS — J449 Chronic obstructive pulmonary disease, unspecified: Secondary | ICD-10-CM

## 2015-10-20 DIAGNOSIS — M159 Polyosteoarthritis, unspecified: Secondary | ICD-10-CM

## 2015-10-20 MED ORDER — PREDNISONE 10 MG PO TABS: ORAL_TABLET | ORAL | Status: DC

## 2015-10-20 MED ORDER — AZITHROMYCIN 250 MG PO TABS: ORAL_TABLET | ORAL | Status: DC

## 2015-10-20 MED ORDER — TIOTROPIUM BROMIDE MONOHYDRATE 18 MCG IN CAPS: 18.0000 ug | ORAL_CAPSULE | Freq: Every day | RESPIRATORY_TRACT | Status: DC

## 2015-10-20 NOTE — Progress Notes (Signed)
Patient ID: Diana Mccoy, female   DOB: 01/27/1946, 70 y.o.   MRN: [id number]         Patient: Diana Mccoy Female    DOB: 11/12/1945   70 y.o.   MRN: [id number] Visit Date: 10/20/2015  Today's Provider: Lorie PhenixNancy Harshaan Whang, MD   Chief Complaint  Patient presents with  . COPD  . Arthritis   Subjective:    Arthritis Presents for follow-up visit. The disease course has been stable. She complains of pain and joint swelling. Pertinent negatives include no diarrhea, fatigue, fever or rash. Past treatments include an opioid. The treatment provided significant relief. Compliance with prior treatments has been good.  Breathing Problem She complains of cough, difficulty breathing, shortness of breath and wheezing. This is a chronic problem. The problem has been gradually worsening (Especially since this morning). The cough is productive of sputum. Associated symptoms include chest pain (Occasional chest pain. ). Pertinent negatives include no fever, headaches or myalgias. Her symptoms are alleviated by steroid inhaler. Her past medical history is significant for COPD.       Allergies  Allergen Reactions  . Ciprofloxacin Nausea And Vomiting  . Penicillins     rash, hives  . Sodium Thiosulfate   . Sulfa Antibiotics    Previous Medications   ACETAMINOPHEN (TYLENOL) 325 MG TABLET    Take 650 mg by mouth.   ALBUTEROL (PROVENTIL) (2.5 MG/3ML) 0.083% NEBULIZER SOLUTION    Take 3 mLs (2.5 mg total) by nebulization every 4 (four) hours as needed for wheezing or shortness of breath.   ALBUTEROL (VENTOLIN HFA) 108 (90 BASE) MCG/ACT INHALER    Inhale 2 puffs into the lungs every 4 (four) hours as needed.    ALENDRONATE (FOSAMAX) 70 MG TABLET    Take 70 mg by mouth once a week.    BUPROPION (WELLBUTRIN SR) 150 MG 12 HR TABLET    Take 150 mg by mouth 2 (two) times daily.    CALCIUM CARB-CHOLECALCIFEROL (OYSTER SHELL CALCIUM PLUS D) 500-200 MG-UNIT TABS    Take 1 tablet by mouth 3 (three) times daily.    CLONAZEPAM (KLONOPIN) 0.5 MG TABLET    Take 1 tablet (0.5 mg total) by mouth 3 (three) times daily.   DOCUSATE SODIUM (COLACE) 100 MG CAPSULE    Take 100 mg by mouth 2 (two) times daily.   ESOMEPRAZOLE (NEXIUM) 40 MG CAPSULE    Take 1 capsule (40 mg total) by mouth daily at 12 noon.   ESTRADIOL (ESTRACE VAGINAL) 0.1 MG/GM VAGINAL CREAM    Place vaginally 3 (three) times a week.    FERROUS SULFATE 325 (65 FE) MG TABLET    Take 325 mg by mouth 2 (two) times daily with a meal.    GLYCOPYRROLATE (ROBINUL) 2 MG TABLET    Take 2 mg by mouth 3 (three) times daily. Reported on 10/20/2015   HYDROCODONE-ACETAMINOPHEN (NORCO/VICODIN) 5-325 MG TABLET    Take 1 tablet by mouth every 6 (six) hours as needed.   LORATADINE (CLARITIN) 10 MG TABLET    Take 10 mg by mouth daily.    MAGNESIUM HYDROXIDE (MILK OF MAGNESIA CONCENTRATE) 2400 MG/10ML SUSP    Take 30 mLs by mouth.    NYSTATIN OINTMENT (MYCOSTATIN)    NYSTATIN, 100000 UNIT/GM (External Ointment)  1 Ointment Ointment Apply to affected area twice a day for 0 days  Quantity: 60;  Refills: 0   Ordered :14-Aug-2013  Lorie PhenixMaloney, Monia Timmers MD;  Started 14-Aug-2013 Active   PALIPERIDONE (INVEGA SUSTENNA) 156  MG/ML SUSP INJECTION    Inject 156 mg into the muscle every 30 (thirty) days.   PALIPERIDONE (INVEGA) 6 MG 24 HR TABLET    Take 6 mg by mouth at bedtime.   TRAZODONE (DESYREL) 100 MG TABLET    Take 100 mg by mouth at bedtime.   TRIMETHOPRIM (TRIMPEX) 100 MG TABLET    TAKE ONE TABLET BY MOUTH EVERY DAY   VITAMIN D, ERGOCALCIFEROL, (DRISDOL) 50000 UNITS CAPS CAPSULE    Take 50,000 Units by mouth every 30 (thirty) days.    ZONISAMIDE (ZONEGRAN) 100 MG CAPSULE    Take 200 mg by mouth daily.     Review of Systems  Constitutional: Negative.  Negative for fever and fatigue.  Respiratory: Positive for cough, shortness of breath and wheezing. Negative for apnea, choking, chest tightness and stridor.   Cardiovascular: Positive for chest pain (Occasional chest pain. ).  Negative for palpitations and leg swelling.  Gastrointestinal: Negative for nausea, vomiting, abdominal pain, diarrhea, constipation, blood in stool, abdominal distention, anal bleeding and rectal pain.  Musculoskeletal: Positive for back pain, joint swelling, arthralgias, gait problem and arthritis. Negative for myalgias, neck pain and neck stiffness.       Chronic Arthritis pain.  Pt reports it is under good control with Norco.   Skin: Negative for rash.  Neurological: Negative for dizziness, light-headedness and headaches.    Social History  Substance Use Topics  . Smoking status: Current Every Day Smoker -- 1.00 packs/day for 30 years  . Smokeless tobacco: Never Used  . Alcohol Use: No   Objective:   BP 122/68 mmHg  Pulse 80  Temp(Src) 97.9 F (36.6 C) (Oral)  Resp 16  Wt   Physical Exam      Assessment & Plan:     1. Chronic obstructive pulmonary disease, unspecified COPD type (HCC) Chronic problem. Not currently on daily inhaler. Will retry Spiriva.   - tiotropium (SPIRIVA HANDIHALER) 18 MCG inhalation capsule; Place 1 capsule (18 mcg total) into inhaler and inhale daily.  Dispense: 30 capsule; Refill: 12  2. Osteoarthritis of multiple joints, unspecified osteoarthritis type Condition is stable. Please continue current medication and  plan of care as noted.    3. COPD exacerbation (HCC) New problem. Worsening. Will treat as below.  Patient instructed to call back if condition worsens or does not improve.    - predniSONE (DELTASONE) 10 MG tablet; 6 po for 1 day and then 5 po for 1 day and then 4 po for 1 day and 3 po for 1 day and then 2 po for 1 day and then 1 po for 1 day.  Dispense: 21 tablet; Refill: 0 - azithromycin (ZITHROMAX) 250 MG tablet; 2 today and then one a day for 4 more days.  Dispense: 6 tablet; Refill: 0    Patient was seen and examined by Leo Grosser, MD, and note scribed by Kavin Leech, CMA.    Lorie Phenix, MD  [facility]

## 2015-10-22 ENCOUNTER — Encounter: Payer: Self-pay | Admitting: Family Medicine

## 2015-10-26 DIAGNOSIS — F209 Schizophrenia, unspecified: Secondary | ICD-10-CM | POA: Diagnosis not present

## 2015-10-26 DIAGNOSIS — F329 Major depressive disorder, single episode, unspecified: Secondary | ICD-10-CM | POA: Diagnosis not present

## 2015-10-26 DIAGNOSIS — F418 Other specified anxiety disorders: Secondary | ICD-10-CM | POA: Diagnosis not present

## 2015-10-27 ENCOUNTER — Other Ambulatory Visit: Payer: Self-pay | Admitting: Family Medicine

## 2015-10-27 ENCOUNTER — Telehealth (INDEPENDENT_AMBULATORY_CARE_PROVIDER_SITE_OTHER): Payer: Medicare Other | Admitting: Family Medicine

## 2015-10-27 DIAGNOSIS — N3090 Cystitis, unspecified without hematuria: Secondary | ICD-10-CM

## 2015-10-27 LAB — POCT URINALYSIS DIPSTICK
Bilirubin, UA: NEGATIVE
Glucose, UA: NEGATIVE
KETONES UA: NEGATIVE
PH UA: 6
PROTEIN UA: NEGATIVE
SPEC GRAV UA: 1.01
UROBILINOGEN UA: 0.2

## 2015-10-27 NOTE — Telephone Encounter (Signed)
Pt's caregiver dropped off Diana Mccoy's urine this morning.  It tested positive on the dip, will send for U/A and Culture.   Thanks,   -Vernona RiegerLaura

## 2015-10-28 LAB — URINALYSIS, ROUTINE W REFLEX MICROSCOPIC
Bilirubin, UA: NEGATIVE
Glucose, UA: NEGATIVE
Ketones, UA: NEGATIVE
Nitrite, UA: POSITIVE — AB
Protein, UA: NEGATIVE
RBC, UA: NEGATIVE
SPEC GRAV UA: 1.014 (ref 1.005–1.030)
UUROB: 0.2 mg/dL (ref 0.2–1.0)
pH, UA: 6.5 (ref 5.0–7.5)

## 2015-10-28 LAB — MICROSCOPIC EXAMINATION
CASTS: NONE SEEN /LPF
WBC, UA: >30 /hpf — AB (ref 0–?)

## 2015-10-31 LAB — PLEASE NOTE

## 2015-10-31 LAB — URINE CULTURE

## 2015-11-01 ENCOUNTER — Telehealth: Payer: Self-pay | Admitting: Family Medicine

## 2015-11-01 DIAGNOSIS — N3090 Cystitis, unspecified without hematuria: Secondary | ICD-10-CM

## 2015-11-01 MED ORDER — NITROFURANTOIN MONOHYD MACRO 100 MG PO CAPS: 100.0000 mg | ORAL_CAPSULE | Freq: Two times a day (BID) | ORAL | Status: DC

## 2015-11-01 NOTE — Telephone Encounter (Signed)
Rosa advised.  [facility]  Also per Rosa's request, faxed results to [facility].   Thanks,   -Vernona RiegerLaura

## 2015-11-01 NOTE — Telephone Encounter (Signed)
Rosa with [facility] called to request results from urine test.  [id number]#[phone]/[id number]/MW

## 2015-11-01 NOTE — Telephone Encounter (Signed)
-----   Message from Lorie PhenixNancy Maloney, MD sent at 10/31/2015 11:46 AM EDT ----- Urine with infection. Sensitive to Macrobid. Please put in order for Macrobid 100 mg bid for 7 days, send to pharmacy and send order to facility. Thanks.

## 2015-11-02 ENCOUNTER — Telehealth: Payer: Self-pay | Admitting: Family Medicine

## 2015-11-02 NOTE — Telephone Encounter (Signed)
Would this come from [facility] or agency like [facility]. Thanks.

## 2015-11-02 NOTE — Telephone Encounter (Signed)
Pt called and would like a new nebulizer b/c she stated her's is leaking. When I asked which pharmacy she gave me the address to [facility]. I asked pt if she uses [facility] and she said yes. Please advise. Thanks TNP

## 2015-11-03 ENCOUNTER — Telehealth: Payer: Self-pay | Admitting: Family Medicine

## 2015-11-03 NOTE — Telephone Encounter (Signed)
Pt called and said that she only received 23 pills of esomeprazole (NEXIUM) 40 MG capsule for the month of May. I asked several questions to understand how she gets the medication since she is living in assisted living. Pt stated that she heard them talking about it in the office at her facility. I advised pt that someone in the office their that handles her medications would need to call us or the pharmacy. She said she couldn't asked them to do that because she would get in trouble. Pt stated that she was advised by someone in the facility that she isn't supposed to contact our office or try to handle her medication. Pt asked that we check on this for her but she doesn't want to get in trouble. Thanks TNP

## 2015-11-03 NOTE — Telephone Encounter (Signed)
Please call the facility and make sure we do about her medication and also if she really needs a new nebulizer. Thanks.

## 2015-11-03 NOTE — Telephone Encounter (Signed)
Tried calling; no answer.   Thanks,   -Laura  

## 2015-11-07 NOTE — Telephone Encounter (Signed)
Per Tammy(resident care coordinator) there is nothing wrong with nebulizer that pt has.No leaks.She states pt will use it every 4 hours even though she is not wheezing or coughing.She feels pt uses it more than she has to

## 2015-11-08 NOTE — Telephone Encounter (Signed)
Please review. Thanks!  

## 2015-11-11 ENCOUNTER — Telehealth: Payer: Self-pay

## 2015-11-11 NOTE — Telephone Encounter (Signed)
LMTCB at [phone]. [name], CMA

## 2015-11-11 NOTE — Telephone Encounter (Signed)
Addressed over the phone. See follow up note. Thanks.

## 2015-11-11 NOTE — Telephone Encounter (Signed)
Do you want to see the patient to review everything so we can understand everything that patient is concerned with-aa

## 2015-11-11 NOTE — Telephone Encounter (Signed)
Diana Mccoy faxed order for new nebulizer to [facility]. [name], pt only received 23 tablets until the next cycle to tide her over, and then she will receive enough tablets for the entire month. Pt is concerned about her hydrocodone prescription. States someone is taking the hydrocodone before she receives her prescriptions. Please advise. [name], CMA

## 2015-11-11 NOTE — Telephone Encounter (Signed)
Pt called said that she needs a new nebulizer, nebulizer medication, & a refill on HYDROcodone-acetaminophen (NORCO/VICODIN) 5-325 MG tablet. Pt also stated that she is out of Nexium and she called the pharmacy and was advised she doesn't have any refills. A refill for Nexium was [facility] to [facility] on 10/13/15 for a 90 day with 1 refill. I spoke with Irving Burtonmily and she stated that she would contact [facility] about the message below on 11/13/15. When I advised pt that we were going to look into it she then told me about the other refills she needed. Pt request a call back to advise her about the Nexium and her other refills. Please advise. Thanks TNP

## 2015-11-11 NOTE — Telephone Encounter (Signed)
Tammy with [facility] returned call. She stated that pt doesn't need a new nebulizer. She said that the cup had cracked and she replaced the cup. Tammy stated that she believes the reason pt is calling our office is b/c she knows that Dr. Elease HashimotoMaloney is leaving the practice and she is wanting to get an appt to see her one last time. I asked if I should go ahead and schedule an appt so that she can do one last follow up with Dr. Elease HashimotoMaloney. Tammy stated that there isn't really anything that pt would need to come in for. Thanks TNP

## 2015-11-11 NOTE — Telephone Encounter (Signed)
Please contact the administrator of facility is and let me talk with them. Thanks.

## 2015-11-23 DIAGNOSIS — F209 Schizophrenia, unspecified: Secondary | ICD-10-CM | POA: Diagnosis not present

## 2015-12-06 ENCOUNTER — Encounter: Payer: Self-pay | Admitting: Family Medicine

## 2015-12-06 ENCOUNTER — Ambulatory Visit (INDEPENDENT_AMBULATORY_CARE_PROVIDER_SITE_OTHER): Payer: Medicare Other | Admitting: Family Medicine

## 2015-12-06 VITALS — BP 140/72 | HR 92 | Temp 98.3°F | Resp 18

## 2015-12-06 DIAGNOSIS — F2089 Other schizophrenia: Secondary | ICD-10-CM | POA: Diagnosis not present

## 2015-12-06 DIAGNOSIS — N390 Urinary tract infection, site not specified: Secondary | ICD-10-CM | POA: Diagnosis not present

## 2015-12-06 DIAGNOSIS — F3175 Bipolar disorder, in partial remission, most recent episode depressed: Secondary | ICD-10-CM

## 2015-12-06 LAB — POCT URINALYSIS DIPSTICK
BILIRUBIN UA: NEGATIVE
Glucose, UA: NEGATIVE
KETONES UA: NEGATIVE
Nitrite, UA: POSITIVE
PH UA: 7
Protein, UA: NEGATIVE
SPEC GRAV UA: 1.01
Urobilinogen, UA: 0.2

## 2015-12-06 MED ORDER — NITROFURANTOIN MONOHYD MACRO 100 MG PO CAPS: 100.0000 mg | ORAL_CAPSULE | Freq: Two times a day (BID) | ORAL | Status: AC

## 2015-12-06 NOTE — Progress Notes (Signed)
Patient: Diana Mccoy Female    DOB: 1946/03/15   70 y.o.   MRN: [id number] Visit Date: 12/06/2015  Today's Provider: Mila Merry, MD   Chief Complaint  Patient presents with  . Dysuria    x several weeks   Subjective:    Dysuria  This is a recurrent problem. The current episode started more than 1 month ago. The problem occurs every urination. The problem has been gradually worsening. The quality of the pain is described as burning. There has been no fever. Associated symptoms include nausea. Pertinent negatives include no chills, discharge, flank pain, frequency, hematuria, hesitancy, sweats, urgency or vomiting.  Patient believes she may have a UTI. Her urine has a strong odor and she complains of low back pain. Patient was treated  For a UTI several weeks ago and feels that it never cleared up.   Patients caregiver states that patients personality has changed to where she has outburst and is rude to the staff.   She resides at [facility] and is followed at      Allergies  Allergen Reactions  . Ciprofloxacin Nausea And Vomiting  . Penicillins     rash, hives  . Sodium Thiosulfate   . Sulfa Antibiotics    Previous Medications   ACETAMINOPHEN (TYLENOL) 325 MG TABLET    Take 650 mg by mouth.   ALBUTEROL (PROVENTIL) (2.5 MG/3ML) 0.083% NEBULIZER SOLUTION    Take 3 mLs (2.5 mg total) by nebulization every 4 (four) hours as needed for wheezing or shortness of breath.   ALBUTEROL (VENTOLIN HFA) 108 (90 BASE) MCG/ACT INHALER    Inhale 2 puffs into the lungs every 4 (four) hours as needed.    ALENDRONATE (FOSAMAX) 70 MG TABLET    Take 70 mg by mouth once a week.    BUPROPION (WELLBUTRIN SR) 150 MG 12 HR TABLET    Take 150 mg by mouth 2 (two) times daily.    CALCIUM CARB-CHOLECALCIFEROL (OYSTER SHELL CALCIUM PLUS D) 500-200 MG-UNIT TABS    Take 1 tablet by mouth 3 (three) times daily.    CLONAZEPAM (KLONOPIN) 0.5 MG TABLET    Take 1 tablet (0.5 mg total) by mouth 3 (three) times  daily.   DOCUSATE SODIUM (COLACE) 100 MG CAPSULE    Take 100 mg by mouth 2 (two) times daily.   ESOMEPRAZOLE (NEXIUM) 40 MG CAPSULE    Take 1 capsule (40 mg total) by mouth daily at 12 noon.   FERROUS SULFATE 325 (65 FE) MG TABLET    Take 325 mg by mouth 2 (two) times daily with a meal.    GLYCOPYRROLATE (ROBINUL) 2 MG TABLET    Take 2 mg by mouth 3 (three) times daily. Reported on 10/20/2015   HYDROCODONE-ACETAMINOPHEN (NORCO/VICODIN) 5-325 MG TABLET    Take 1 tablet by mouth every 6 (six) hours as needed.   LORATADINE (CLARITIN) 10 MG TABLET    Take 10 mg by mouth daily.    MAGNESIUM HYDROXIDE (MILK OF MAGNESIA CONCENTRATE) 2400 MG/10ML SUSP    Take 30 mLs by mouth.    MELOXICAM (MOBIC) 7.5 MG TABLET    Take 7.5 mg by mouth 2 (two) times daily.   NYSTATIN OINTMENT (MYCOSTATIN)    NYSTATIN, 100000 UNIT/GM (External Ointment)  1 Ointment Ointment Apply to affected area twice a day for 0 days  Quantity: 60;  Refills: 0   Ordered :14-Aug-2013  Lorie Phenix MD;  Started 14-Aug-2013 Active   PALIPERIDONE (INVEGA SUSTENNA)  156 MG/ML SUSP INJECTION    Inject 156 mg into the muscle every 30 (thirty) days.   TIOTROPIUM (SPIRIVA HANDIHALER) 18 MCG INHALATION CAPSULE    Place 1 capsule (18 mcg total) into inhaler and inhale daily.   TRAZODONE (DESYREL) 100 MG TABLET    Take 100 mg by mouth at bedtime.   TRIMETHOPRIM (TRIMPEX) 100 MG TABLET    TAKE ONE TABLET BY MOUTH EVERY DAY   VITAMIN D, ERGOCALCIFEROL, (DRISDOL) 50000 UNITS CAPS CAPSULE    Take 50,000 Units by mouth every 30 (thirty) days.    ZONISAMIDE (ZONEGRAN) 100 MG CAPSULE    Take 200 mg by mouth daily.     Review of Systems  Constitutional: Negative for fever, chills, diaphoresis, appetite change and fatigue.  Respiratory: Negative for chest tightness and shortness of breath.   Cardiovascular: Negative for chest pain and palpitations.  Gastrointestinal: Positive for nausea. Negative for vomiting and abdominal pain.  Genitourinary:  Positive for dysuria. Negative for hesitancy, urgency, frequency, hematuria, flank pain, decreased urine volume, vaginal bleeding, vaginal discharge and vaginal pain.  Musculoskeletal: Positive for myalgias (right leg pain) and back pain.  Neurological: Positive for dizziness, light-headedness and headaches. Negative for weakness.  Psychiatric/Behavioral: Positive for behavioral problems and agitation.    Social History  Substance Use Topics  . Smoking status: Current Every Day Smoker -- 1.00 packs/day for 30 years  . Smokeless tobacco: Never Used  . Alcohol Use: No   Objective:   BP 140/72 mmHg  Pulse 92  Temp(Src) 98.3 F (36.8 C) (Oral)  Resp 18  SpO2 94%  Physical Exam   General Appearance:    Alert, cooperative, no distress  Eyes:    PERRL, conjunctiva/corneas clear, EOM's intact       Lungs:     Clear to auscultation bilaterally, respirations unlabored  Heart:    Regular rate and rhythm  Neurologic:   Awake, alert, oriented x 3. No apparent focal neurological           defect.        Results for orders placed or performed in visit on 12/06/15  POCT urinalysis dipstick  Result Value Ref Range   Color, UA yellow    Clarity, UA cloudy    Glucose, UA negative    Bilirubin, UA negative    Ketones, UA negative    Spec Grav, UA 1.010    Blood, UA trace (non hemolyzed)    pH, UA 7.0    Protein, UA negative    Urobilinogen, UA 0.2    Nitrite, UA positive    Leukocytes, UA moderate (2+) (A) Negative       Assessment & Plan:     1. Urinary tract infection without hematuria, site unspecified  - POCT urinalysis dipstick - Urine Culture  2. Bipolar disorder, in partial remission, most recent episode depressed (HCC) Worsening. Completed order for home healthy psychiatric assessment. Patent is homebound and [facility] due to inability to leave facility without assistance secondary to psychiatric disorder.   3. Other schizophrenia (HCC)      The entirety of the  information documented in the History of Present Illness, Review of Systems and Physical Exam were personally obtained by me. Portions of this information were initially documented by Awilda Billoshena Chambers, CMA and reviewed by me for thoroughness and accuracy.   The entirety of the information documented in the History of Present Illness, Review of Systems and Physical Exam were personally obtained by me. Portions of this information were  initially documented by Awilda Bill, CMA and reviewed by me for thoroughness and accuracy.   Mila Merry, MD  [facility]

## 2015-12-07 ENCOUNTER — Telehealth: Payer: Self-pay | Admitting: Family Medicine

## 2015-12-07 NOTE — Telephone Encounter (Signed)
When Shavontae was in yesterday Dr, Radiographer, therapeuticfisher signed an order for her to have a home health accessment .  [facility] needs progress notes from yesterday visit.  Their fax number is [phone]  Attn:  Leonel Ramsayonnie  Thanks, Barth Kirksteri

## 2015-12-08 DIAGNOSIS — F3175 Bipolar disorder, in partial remission, most recent episode depressed: Secondary | ICD-10-CM | POA: Diagnosis not present

## 2015-12-08 DIAGNOSIS — N390 Urinary tract infection, site not specified: Secondary | ICD-10-CM | POA: Diagnosis not present

## 2015-12-08 DIAGNOSIS — M1990 Unspecified osteoarthritis, unspecified site: Secondary | ICD-10-CM | POA: Diagnosis not present

## 2015-12-08 DIAGNOSIS — J449 Chronic obstructive pulmonary disease, unspecified: Secondary | ICD-10-CM | POA: Diagnosis not present

## 2015-12-08 NOTE — Telephone Encounter (Signed)
Please advise 

## 2015-12-08 NOTE — Telephone Encounter (Signed)
Please send records if we have consent to do so.

## 2015-12-09 LAB — URINE CULTURE

## 2015-12-12 ENCOUNTER — Telehealth: Payer: Self-pay

## 2015-12-12 ENCOUNTER — Telehealth: Payer: Self-pay | Admitting: Family Medicine

## 2015-12-12 NOTE — Telephone Encounter (Signed)
Patient reports that 3 nights ago she asked for her nebulizer treatment and was denied the treatment. Patient reports that she was having shortness of breath at that time during the night. Patient reports that she asked the supervisor Aurther Lofterry and Liborio Nixonjanice to help with that problem. Patient reports that she also told them that she would let her PCP know what's going on with her [facility]. Patient reports that since then they gave her back her nebulizer. Patient wants to know if Dr. Elease HashimotoMaloney can talk to someone so that this does not happen again.

## 2015-12-12 NOTE — Telephone Encounter (Signed)
Please call  Facility and clarify. Patient does need her nebulizer.  Thanks.

## 2015-12-12 NOTE — Telephone Encounter (Signed)
Pt's daughter called stating she is returning a call regarding results of urine test.  Please call her back with results

## 2015-12-13 NOTE — Telephone Encounter (Signed)
Spoke with [facility] at [facility] and she states patient does not get denied treatment but patient is abusing use of nebulizer, the order is written for as needed every 6 hours and states patient demands it every 6 hours on the dot and wont calm down until she gets it even though her breathing would be fine. She states patient asks for it at 2 am and wakes other residents up and will keep the nebulizer running (it is loud) for an hour even though there is no medication in it. And she also states it would probably help to change order to BID and if you agree this order will need to be faxed to them at [phone]. Thank you-aa

## 2015-12-13 NOTE — Telephone Encounter (Signed)
Dr. Elease HashimotoMaloney signed updated order, will fax to [facility] as below. [name], CMA

## 2015-12-13 NOTE — Telephone Encounter (Signed)
Can change to TID. Thanks.

## 2015-12-13 NOTE — Telephone Encounter (Signed)
LMOVM for pt's daughter to return call.             

## 2015-12-15 DIAGNOSIS — M1990 Unspecified osteoarthritis, unspecified site: Secondary | ICD-10-CM | POA: Diagnosis not present

## 2015-12-15 DIAGNOSIS — J449 Chronic obstructive pulmonary disease, unspecified: Secondary | ICD-10-CM | POA: Diagnosis not present

## 2015-12-15 DIAGNOSIS — F3175 Bipolar disorder, in partial remission, most recent episode depressed: Secondary | ICD-10-CM | POA: Diagnosis not present

## 2015-12-15 DIAGNOSIS — N390 Urinary tract infection, site not specified: Secondary | ICD-10-CM | POA: Diagnosis not present

## 2015-12-16 ENCOUNTER — Ambulatory Visit: Payer: Self-pay | Admitting: Family Medicine

## 2015-12-19 NOTE — Telephone Encounter (Signed)
Patient's daughter was notified.

## 2015-12-22 DIAGNOSIS — M1990 Unspecified osteoarthritis, unspecified site: Secondary | ICD-10-CM | POA: Diagnosis not present

## 2015-12-22 DIAGNOSIS — F3175 Bipolar disorder, in partial remission, most recent episode depressed: Secondary | ICD-10-CM | POA: Diagnosis not present

## 2015-12-22 DIAGNOSIS — N390 Urinary tract infection, site not specified: Secondary | ICD-10-CM | POA: Diagnosis not present

## 2015-12-22 DIAGNOSIS — J449 Chronic obstructive pulmonary disease, unspecified: Secondary | ICD-10-CM | POA: Diagnosis not present

## 2015-12-23 DIAGNOSIS — N390 Urinary tract infection, site not specified: Secondary | ICD-10-CM | POA: Diagnosis not present

## 2015-12-23 DIAGNOSIS — M1990 Unspecified osteoarthritis, unspecified site: Secondary | ICD-10-CM | POA: Diagnosis not present

## 2015-12-23 DIAGNOSIS — J449 Chronic obstructive pulmonary disease, unspecified: Secondary | ICD-10-CM | POA: Diagnosis not present

## 2015-12-23 DIAGNOSIS — F3175 Bipolar disorder, in partial remission, most recent episode depressed: Secondary | ICD-10-CM | POA: Diagnosis not present

## 2015-12-27 DIAGNOSIS — J449 Chronic obstructive pulmonary disease, unspecified: Secondary | ICD-10-CM | POA: Diagnosis not present

## 2015-12-27 DIAGNOSIS — N390 Urinary tract infection, site not specified: Secondary | ICD-10-CM | POA: Diagnosis not present

## 2015-12-27 DIAGNOSIS — F3175 Bipolar disorder, in partial remission, most recent episode depressed: Secondary | ICD-10-CM | POA: Diagnosis not present

## 2015-12-27 DIAGNOSIS — M1990 Unspecified osteoarthritis, unspecified site: Secondary | ICD-10-CM | POA: Diagnosis not present

## 2015-12-28 ENCOUNTER — Telehealth: Payer: Self-pay

## 2015-12-28 DIAGNOSIS — M1990 Unspecified osteoarthritis, unspecified site: Secondary | ICD-10-CM | POA: Diagnosis not present

## 2015-12-28 DIAGNOSIS — J449 Chronic obstructive pulmonary disease, unspecified: Secondary | ICD-10-CM | POA: Diagnosis not present

## 2015-12-28 DIAGNOSIS — F3175 Bipolar disorder, in partial remission, most recent episode depressed: Secondary | ICD-10-CM | POA: Diagnosis not present

## 2015-12-28 DIAGNOSIS — N390 Urinary tract infection, site not specified: Secondary | ICD-10-CM | POA: Diagnosis not present

## 2015-12-28 DIAGNOSIS — F209 Schizophrenia, unspecified: Secondary | ICD-10-CM | POA: Diagnosis not present

## 2015-12-28 NOTE — Telephone Encounter (Signed)
Pt called because [facility] is no longer administering Norco at 2:00am. Advised pt we lowered dose to TID, so pt is no longer taking the 2:00am dose. Per Dr. Santiago BurMaloney's advise, I informed pt we could refer pt to Dr. Yves Dillhasnis for pain management. Pt declined at this time. [name], CMA

## 2015-12-29 ENCOUNTER — Telehealth: Payer: Self-pay | Admitting: Family Medicine

## 2015-12-29 DIAGNOSIS — M1990 Unspecified osteoarthritis, unspecified site: Secondary | ICD-10-CM | POA: Diagnosis not present

## 2015-12-29 DIAGNOSIS — F3175 Bipolar disorder, in partial remission, most recent episode depressed: Secondary | ICD-10-CM | POA: Diagnosis not present

## 2015-12-29 DIAGNOSIS — J449 Chronic obstructive pulmonary disease, unspecified: Secondary | ICD-10-CM | POA: Diagnosis not present

## 2015-12-29 DIAGNOSIS — N390 Urinary tract infection, site not specified: Secondary | ICD-10-CM | POA: Diagnosis not present

## 2015-12-29 MED ORDER — HYDROCODONE-ACETAMINOPHEN 5-325 MG PO TABS: 1.0000 | ORAL_TABLET | Freq: Three times a day (TID) | ORAL | Status: DC

## 2015-12-29 NOTE — Telephone Encounter (Signed)
Pt stated she would like to speak with Dr. Elease HashimotoMaloney. Pt stated that she hasn't had HYDROcodone-acetaminophen (NORCO/VICODIN) 5-325 MG tablet  2 or 3 days b/c she is out and they haven't been giving her the medication. Please advise. Thanks TNP

## 2015-12-29 NOTE — Telephone Encounter (Signed)
Filled rx. Please call [facility] and see what we do with rx. Thanks.

## 2015-12-29 NOTE — Telephone Encounter (Signed)
Rx faxed; and [facility] to [facility].   Thanks,   -Vernona RiegerLaura

## 2016-01-03 DIAGNOSIS — M1990 Unspecified osteoarthritis, unspecified site: Secondary | ICD-10-CM | POA: Diagnosis not present

## 2016-01-03 DIAGNOSIS — J449 Chronic obstructive pulmonary disease, unspecified: Secondary | ICD-10-CM | POA: Diagnosis not present

## 2016-01-03 DIAGNOSIS — N390 Urinary tract infection, site not specified: Secondary | ICD-10-CM | POA: Diagnosis not present

## 2016-01-03 DIAGNOSIS — F3175 Bipolar disorder, in partial remission, most recent episode depressed: Secondary | ICD-10-CM | POA: Diagnosis not present

## 2016-01-04 DIAGNOSIS — J449 Chronic obstructive pulmonary disease, unspecified: Secondary | ICD-10-CM | POA: Diagnosis not present

## 2016-01-04 DIAGNOSIS — N390 Urinary tract infection, site not specified: Secondary | ICD-10-CM | POA: Diagnosis not present

## 2016-01-04 DIAGNOSIS — M1990 Unspecified osteoarthritis, unspecified site: Secondary | ICD-10-CM | POA: Diagnosis not present

## 2016-01-04 DIAGNOSIS — F3175 Bipolar disorder, in partial remission, most recent episode depressed: Secondary | ICD-10-CM | POA: Diagnosis not present

## 2016-01-05 DIAGNOSIS — F3175 Bipolar disorder, in partial remission, most recent episode depressed: Secondary | ICD-10-CM | POA: Diagnosis not present

## 2016-01-05 DIAGNOSIS — J449 Chronic obstructive pulmonary disease, unspecified: Secondary | ICD-10-CM | POA: Diagnosis not present

## 2016-01-05 DIAGNOSIS — M1990 Unspecified osteoarthritis, unspecified site: Secondary | ICD-10-CM | POA: Diagnosis not present

## 2016-01-05 DIAGNOSIS — N390 Urinary tract infection, site not specified: Secondary | ICD-10-CM | POA: Diagnosis not present

## 2016-01-10 DIAGNOSIS — J449 Chronic obstructive pulmonary disease, unspecified: Secondary | ICD-10-CM | POA: Diagnosis not present

## 2016-01-10 DIAGNOSIS — F3175 Bipolar disorder, in partial remission, most recent episode depressed: Secondary | ICD-10-CM | POA: Diagnosis not present

## 2016-01-10 DIAGNOSIS — M1990 Unspecified osteoarthritis, unspecified site: Secondary | ICD-10-CM | POA: Diagnosis not present

## 2016-01-10 DIAGNOSIS — N390 Urinary tract infection, site not specified: Secondary | ICD-10-CM | POA: Diagnosis not present

## 2016-01-13 DIAGNOSIS — F3175 Bipolar disorder, in partial remission, most recent episode depressed: Secondary | ICD-10-CM | POA: Diagnosis not present

## 2016-01-13 DIAGNOSIS — M1990 Unspecified osteoarthritis, unspecified site: Secondary | ICD-10-CM | POA: Diagnosis not present

## 2016-01-13 DIAGNOSIS — J449 Chronic obstructive pulmonary disease, unspecified: Secondary | ICD-10-CM | POA: Diagnosis not present

## 2016-01-13 DIAGNOSIS — N390 Urinary tract infection, site not specified: Secondary | ICD-10-CM | POA: Diagnosis not present

## 2016-01-18 DIAGNOSIS — M1990 Unspecified osteoarthritis, unspecified site: Secondary | ICD-10-CM | POA: Diagnosis not present

## 2016-01-18 DIAGNOSIS — F3175 Bipolar disorder, in partial remission, most recent episode depressed: Secondary | ICD-10-CM | POA: Diagnosis not present

## 2016-01-18 DIAGNOSIS — N390 Urinary tract infection, site not specified: Secondary | ICD-10-CM | POA: Diagnosis not present

## 2016-01-18 DIAGNOSIS — J449 Chronic obstructive pulmonary disease, unspecified: Secondary | ICD-10-CM | POA: Diagnosis not present

## 2016-01-19 ENCOUNTER — Ambulatory Visit (INDEPENDENT_AMBULATORY_CARE_PROVIDER_SITE_OTHER): Payer: Medicare Other | Admitting: Family Medicine

## 2016-01-19 ENCOUNTER — Encounter: Payer: Self-pay | Admitting: Family Medicine

## 2016-01-19 VITALS — BP 126/64 | HR 89 | Temp 98.2°F | Resp 16

## 2016-01-19 DIAGNOSIS — F3175 Bipolar disorder, in partial remission, most recent episode depressed: Secondary | ICD-10-CM | POA: Diagnosis not present

## 2016-01-19 DIAGNOSIS — M5410 Radiculopathy, site unspecified: Secondary | ICD-10-CM | POA: Diagnosis not present

## 2016-01-19 DIAGNOSIS — M1990 Unspecified osteoarthritis, unspecified site: Secondary | ICD-10-CM | POA: Diagnosis not present

## 2016-01-19 DIAGNOSIS — N390 Urinary tract infection, site not specified: Secondary | ICD-10-CM

## 2016-01-19 DIAGNOSIS — M4800 Spinal stenosis, site unspecified: Secondary | ICD-10-CM | POA: Diagnosis not present

## 2016-01-19 DIAGNOSIS — J449 Chronic obstructive pulmonary disease, unspecified: Secondary | ICD-10-CM | POA: Insufficient documentation

## 2016-01-19 HISTORY — DX: Chronic obstructive pulmonary disease, unspecified: J44.9

## 2016-01-19 LAB — POCT URINALYSIS DIPSTICK
BILIRUBIN UA: NEGATIVE
GLUCOSE UA: NEGATIVE
KETONES UA: NEGATIVE
NITRITE UA: POSITIVE
PH UA: 6.5
Protein, UA: NEGATIVE
SPEC GRAV UA: 1.015
Urobilinogen, UA: 0.2

## 2016-01-19 MED ORDER — CEFDINIR 300 MG PO CAPS: 300.0000 mg | ORAL_CAPSULE | Freq: Two times a day (BID) | ORAL | Status: AC

## 2016-01-19 MED ORDER — PREDNISONE 10 MG PO TABS: ORAL_TABLET | ORAL | Status: AC

## 2016-01-19 NOTE — Progress Notes (Signed)
Patient: Diana Mccoy Female    DOB: 12/16/1945   70 y.o.   MRN: [id number] Visit Date: 01/19/2016  Today's Provider: Mila Merryonald Taten Merrow, MD   Chief Complaint  Patient presents with  . Back Pain   Subjective:    Back Pain This is a new problem. The current episode started more than 1 month ago. The problem occurs intermittently. The problem has been gradually worsening since onset. The pain is present in the lumbar spine. Quality: sharp shooting  The pain radiates to the right foot. Associated symptoms include leg pain, pelvic pain and weakness. Pertinent negatives include no abdominal pain, bladder incontinence, bowel incontinence, chest pain, dysuria, fever, headaches, numbness, paresis, paresthesias or tingling.  Taking hydrocodone on schedule three times a day and meloxicam twice a day.   Follow up COPD. Is doing well with Spiriva which she is using daily and tolerating well.      Allergies  Allergen Reactions  . Ciprofloxacin Nausea And Vomiting  . Penicillins     rash, hives  . Sodium Thiosulfate   . Sulfa Antibiotics    Current Meds  Medication Sig  . acetaminophen (TYLENOL) 325 MG tablet Take 650 mg by mouth.  Marland Kitchen. albuterol (PROVENTIL) (2.5 MG/3ML) 0.083% nebulizer solution Take 3 mLs (2.5 mg total) by nebulization every 4 (four) hours as needed for wheezing or shortness of breath.  Marland Kitchen. albuterol (VENTOLIN HFA) 108 (90 BASE) MCG/ACT inhaler Inhale 2 puffs into the lungs every 4 (four) hours as needed.   Marland Kitchen. alendronate (FOSAMAX) 70 MG tablet Take 70 mg by mouth once a week.   Marland Kitchen. buPROPion (WELLBUTRIN SR) 150 MG 12 hr tablet Take 150 mg by mouth 2 (two) times daily.   . Calcium Carb-Cholecalciferol (OYSTER SHELL CALCIUM PLUS D) 500-200 MG-UNIT TABS Take 1 tablet by mouth 3 (three) times daily.   . clonazePAM (KLONOPIN) 0.5 MG tablet Take 1 tablet (0.5 mg total) by mouth 3 (three) times daily.  Marland Kitchen. docusate sodium (COLACE) 100 MG capsule Take 100 mg by mouth 2 (two) times daily.    Marland Kitchen. esomeprazole (NEXIUM) 40 MG capsule Take 1 capsule (40 mg total) by mouth daily at 12 noon.  . ferrous sulfate 325 (65 FE) MG tablet Take 325 mg by mouth 2 (two) times daily with a meal.   . glycopyrrolate (ROBINUL) 2 MG tablet Take 2 mg by mouth 3 (three) times daily. Reported on 10/20/2015  . HYDROcodone-acetaminophen (NORCO/VICODIN) 5-325 MG tablet Take 1 tablet by mouth 3 (three) times daily.  Marland Kitchen. loratadine (CLARITIN) 10 MG tablet Take 10 mg by mouth daily.   . Magnesium Hydroxide (MILK OF MAGNESIA CONCENTRATE) 2400 MG/10ML SUSP Take 30 mLs by mouth.   . meloxicam (MOBIC) 7.5 MG tablet Take 7.5 mg by mouth 2 (two) times daily.  Marland Kitchen. nystatin ointment (MYCOSTATIN) NYSTATIN, 100000 UNIT/GM (External Ointment)  1 Ointment Ointment Apply to affected area twice a day for 0 days  Quantity: 60;  Refills: 0   Ordered :14-Aug-2013  Lorie PhenixMaloney, Nancy MD;  Started 14-Aug-2013 Active  . paliperidone (INVEGA SUSTENNA) 156 MG/ML SUSP injection Inject 156 mg into the muscle every 30 (thirty) days.  Marland Kitchen. tiotropium (SPIRIVA HANDIHALER) 18 MCG inhalation capsule Place 1 capsule (18 mcg total) into inhaler and inhale daily.  . traZODone (DESYREL) 100 MG tablet Take 100 mg by mouth at bedtime.  Marland Kitchen. trimethoprim (TRIMPEX) 100 MG tablet TAKE ONE TABLET BY MOUTH EVERY DAY  . Vitamin D, Ergocalciferol, (DRISDOL) 50000 UNITS CAPS capsule  Take 50,000 Units by mouth every 30 (thirty) days.   Marland Kitchen zonisamide (ZONEGRAN) 100 MG capsule Take 200 mg by mouth daily.     Review of Systems  Constitutional: Positive for chills. Negative for fever, appetite change and fatigue.  Respiratory: Negative for chest tightness and shortness of breath.   Cardiovascular: Negative for chest pain and palpitations.  Gastrointestinal: Negative for nausea, vomiting, abdominal pain and bowel incontinence.  Endocrine: Positive for cold intolerance.  Genitourinary: Positive for pelvic pain. Negative for bladder incontinence and dysuria.   Musculoskeletal: Positive for back pain.  Neurological: Positive for weakness. Negative for dizziness, tingling, numbness, headaches and paresthesias.    Social History  Substance Use Topics  . Smoking status: Current Every Day Smoker -- 1.00 packs/day for 30 years  . Smokeless tobacco: Never Used  . Alcohol Use: No   Objective:   BP 126/64 mmHg  Pulse 89  Temp(Src) 98.2 F (36.8 C) (Oral)  Resp 16  SpO2 92%  Physical Exam   General Appearance:    Alert, cooperative, no distress  Eyes:    PERRL, conjunctiva/corneas clear, EOM's intact       Lungs:     Clear to auscultation bilaterally, respirations unlabored  Heart:    Regular rate and rhythm  Neurologic:   Awake, alert, oriented x 3. No apparent focal neurological           defect.       POCT urinalysis dipstick  Result Value Ref Range   Color, UA yellow    Clarity, UA cloudy    Glucose, UA negative    Bilirubin, UA negative    Ketones, UA negative    Spec Grav, UA 1.015    Blood, UA Trace (non hemolyzed)    pH, UA 6.5    Protein, UA negative    Urobilinogen, UA 0.2    Nitrite, UA positive    Leukocytes, UA moderate (2+) (A) Negative        Assessment & Plan:     1. Urinary tract infection without hematuria, site unspecified  - POCT urinalysis dipstick - cefdinir (OMNICEF) 300 MG capsule; Take 1 capsule (300 mg total) by mouth 2 (two) times daily.  Dispense: 14 capsule; Refill: 0 - Urine Culture  2. Spinal stenosis, unspecified spinal region  - predniSONE (DELTASONE) 10 MG tablet; 6 tablets for 1 day, then 5 for 1 day, then 4 for 1 day, then 3 for 1 day, then 2 for 1 day then 1 for 1 day.  Dispense: 21 tablet; Refill: 0  3. Back pain with right-sided radiculopathy   4. Chronic obstructive pulmonary disease, unspecified COPD type (HCC) Stable on current regiment of inhalers. Continue unchanged.      The entirety of the information documented in the History of Present Illness, Review of Systems and  Physical Exam were personally obtained by me. Portions of this information were initially documented by Awilda Bill, CMA and reviewed by me for thoroughness and accuracy.    Mila Merry, MD  [facility]

## 2016-01-23 LAB — URINE CULTURE

## 2016-01-24 DIAGNOSIS — N390 Urinary tract infection, site not specified: Secondary | ICD-10-CM | POA: Diagnosis not present

## 2016-01-24 DIAGNOSIS — F3175 Bipolar disorder, in partial remission, most recent episode depressed: Secondary | ICD-10-CM | POA: Diagnosis not present

## 2016-01-24 DIAGNOSIS — M1990 Unspecified osteoarthritis, unspecified site: Secondary | ICD-10-CM | POA: Diagnosis not present

## 2016-01-24 DIAGNOSIS — J449 Chronic obstructive pulmonary disease, unspecified: Secondary | ICD-10-CM | POA: Diagnosis not present

## 2016-01-25 DIAGNOSIS — J449 Chronic obstructive pulmonary disease, unspecified: Secondary | ICD-10-CM | POA: Diagnosis not present

## 2016-01-25 DIAGNOSIS — N390 Urinary tract infection, site not specified: Secondary | ICD-10-CM | POA: Diagnosis not present

## 2016-01-25 DIAGNOSIS — F3175 Bipolar disorder, in partial remission, most recent episode depressed: Secondary | ICD-10-CM | POA: Diagnosis not present

## 2016-01-25 DIAGNOSIS — F209 Schizophrenia, unspecified: Secondary | ICD-10-CM | POA: Diagnosis not present

## 2016-01-25 DIAGNOSIS — M1990 Unspecified osteoarthritis, unspecified site: Secondary | ICD-10-CM | POA: Diagnosis not present

## 2016-01-30 ENCOUNTER — Other Ambulatory Visit: Payer: Self-pay | Admitting: Family Medicine

## 2016-01-30 ENCOUNTER — Other Ambulatory Visit: Payer: Self-pay

## 2016-01-30 DIAGNOSIS — M1990 Unspecified osteoarthritis, unspecified site: Secondary | ICD-10-CM

## 2016-01-30 DIAGNOSIS — F3175 Bipolar disorder, in partial remission, most recent episode depressed: Secondary | ICD-10-CM | POA: Diagnosis not present

## 2016-01-30 DIAGNOSIS — J449 Chronic obstructive pulmonary disease, unspecified: Secondary | ICD-10-CM | POA: Diagnosis not present

## 2016-01-30 DIAGNOSIS — N390 Urinary tract infection, site not specified: Secondary | ICD-10-CM | POA: Diagnosis not present

## 2016-01-30 MED ORDER — HYDROCODONE-ACETAMINOPHEN 5-325 MG PO TABS: 1.0000 | ORAL_TABLET | Freq: Three times a day (TID) | ORAL | 0 refills | Status: DC

## 2016-01-30 NOTE — Telephone Encounter (Signed)
This is the second request that has been sent back for the same medication.

## 2016-01-30 NOTE — Telephone Encounter (Signed)
Pharmacy called to get an update of this medication. Patient is completely out. Please [facility] at ( [phone].

## 2016-01-30 NOTE — Telephone Encounter (Signed)
Pt contacted office for refill request on the following medications:  HYDROcodone-acetaminophen (NORCO/VICODIN) 5-325 MG tablet.  [id number]#[phone]/[id number]  This a Dr Maloney/Dr Sherrie Mustache pt.  Pt is completely out of medication.

## 2016-01-30 NOTE — Telephone Encounter (Signed)
Rosa from [facility] called saying that patient is out of medication. Please refill. Thanks!

## 2016-01-30 NOTE — Telephone Encounter (Signed)
1 rf printed only

## 2016-01-30 NOTE — Telephone Encounter (Signed)
Prescription has been printed and faxed to [facility]. Hard copy prescription mailed to [facility] address:  [address] [place]. [place] [address]

## 2016-01-30 NOTE — Telephone Encounter (Signed)
Please review-aa 

## 2016-01-31 ENCOUNTER — Other Ambulatory Visit: Payer: Self-pay | Admitting: Family Medicine

## 2016-01-31 DIAGNOSIS — M1990 Unspecified osteoarthritis, unspecified site: Secondary | ICD-10-CM

## 2016-01-31 NOTE — Telephone Encounter (Signed)
Pt needs refill on her Hydrocodone 5-325.  Pt is out of the rx.  Please call patient when ready.    Thanks Barth Kirks

## 2016-02-01 DIAGNOSIS — M1990 Unspecified osteoarthritis, unspecified site: Secondary | ICD-10-CM | POA: Diagnosis not present

## 2016-02-01 DIAGNOSIS — N390 Urinary tract infection, site not specified: Secondary | ICD-10-CM | POA: Diagnosis not present

## 2016-02-01 DIAGNOSIS — F3175 Bipolar disorder, in partial remission, most recent episode depressed: Secondary | ICD-10-CM | POA: Diagnosis not present

## 2016-02-01 DIAGNOSIS — J449 Chronic obstructive pulmonary disease, unspecified: Secondary | ICD-10-CM | POA: Diagnosis not present

## 2016-02-06 DIAGNOSIS — R2689 Other abnormalities of gait and mobility: Secondary | ICD-10-CM | POA: Diagnosis not present

## 2016-02-06 DIAGNOSIS — Z9181 History of falling: Secondary | ICD-10-CM | POA: Diagnosis not present

## 2016-02-06 DIAGNOSIS — F209 Schizophrenia, unspecified: Secondary | ICD-10-CM | POA: Diagnosis not present

## 2016-02-06 DIAGNOSIS — F419 Anxiety disorder, unspecified: Secondary | ICD-10-CM | POA: Diagnosis not present

## 2016-02-06 DIAGNOSIS — J449 Chronic obstructive pulmonary disease, unspecified: Secondary | ICD-10-CM | POA: Diagnosis not present

## 2016-02-06 DIAGNOSIS — F3175 Bipolar disorder, in partial remission, most recent episode depressed: Secondary | ICD-10-CM | POA: Diagnosis not present

## 2016-02-07 ENCOUNTER — Encounter (INDEPENDENT_AMBULATORY_CARE_PROVIDER_SITE_OTHER): Payer: Medicare Other | Admitting: Family Medicine

## 2016-02-07 DIAGNOSIS — F3175 Bipolar disorder, in partial remission, most recent episode depressed: Secondary | ICD-10-CM

## 2016-02-07 DIAGNOSIS — Z9181 History of falling: Secondary | ICD-10-CM

## 2016-02-07 DIAGNOSIS — J449 Chronic obstructive pulmonary disease, unspecified: Secondary | ICD-10-CM | POA: Diagnosis not present

## 2016-02-07 DIAGNOSIS — F419 Anxiety disorder, unspecified: Secondary | ICD-10-CM

## 2016-02-07 DIAGNOSIS — R2689 Other abnormalities of gait and mobility: Secondary | ICD-10-CM | POA: Diagnosis not present

## 2016-02-07 DIAGNOSIS — F209 Schizophrenia, unspecified: Secondary | ICD-10-CM

## 2016-02-09 ENCOUNTER — Other Ambulatory Visit: Payer: Self-pay | Admitting: *Deleted

## 2016-02-09 DIAGNOSIS — Z9181 History of falling: Secondary | ICD-10-CM | POA: Diagnosis not present

## 2016-02-09 DIAGNOSIS — F209 Schizophrenia, unspecified: Secondary | ICD-10-CM | POA: Diagnosis not present

## 2016-02-09 DIAGNOSIS — F419 Anxiety disorder, unspecified: Secondary | ICD-10-CM | POA: Diagnosis not present

## 2016-02-09 DIAGNOSIS — R2689 Other abnormalities of gait and mobility: Secondary | ICD-10-CM | POA: Diagnosis not present

## 2016-02-09 DIAGNOSIS — F3175 Bipolar disorder, in partial remission, most recent episode depressed: Secondary | ICD-10-CM | POA: Diagnosis not present

## 2016-02-09 DIAGNOSIS — J449 Chronic obstructive pulmonary disease, unspecified: Secondary | ICD-10-CM | POA: Diagnosis not present

## 2016-02-09 DIAGNOSIS — K219 Gastro-esophageal reflux disease without esophagitis: Secondary | ICD-10-CM

## 2016-02-09 MED ORDER — ESOMEPRAZOLE MAGNESIUM 40 MG PO CPDR: 40.0000 mg | DELAYED_RELEASE_CAPSULE | Freq: Every day | ORAL | 4 refills | Status: DC

## 2016-02-13 DIAGNOSIS — R2689 Other abnormalities of gait and mobility: Secondary | ICD-10-CM | POA: Diagnosis not present

## 2016-02-13 DIAGNOSIS — J449 Chronic obstructive pulmonary disease, unspecified: Secondary | ICD-10-CM | POA: Diagnosis not present

## 2016-02-13 DIAGNOSIS — F3175 Bipolar disorder, in partial remission, most recent episode depressed: Secondary | ICD-10-CM | POA: Diagnosis not present

## 2016-02-13 DIAGNOSIS — F419 Anxiety disorder, unspecified: Secondary | ICD-10-CM | POA: Diagnosis not present

## 2016-02-13 DIAGNOSIS — F209 Schizophrenia, unspecified: Secondary | ICD-10-CM | POA: Diagnosis not present

## 2016-02-13 DIAGNOSIS — Z9181 History of falling: Secondary | ICD-10-CM | POA: Diagnosis not present

## 2016-02-16 DIAGNOSIS — J449 Chronic obstructive pulmonary disease, unspecified: Secondary | ICD-10-CM | POA: Diagnosis not present

## 2016-02-16 DIAGNOSIS — F419 Anxiety disorder, unspecified: Secondary | ICD-10-CM | POA: Diagnosis not present

## 2016-02-16 DIAGNOSIS — F3175 Bipolar disorder, in partial remission, most recent episode depressed: Secondary | ICD-10-CM | POA: Diagnosis not present

## 2016-02-16 DIAGNOSIS — Z9181 History of falling: Secondary | ICD-10-CM | POA: Diagnosis not present

## 2016-02-16 DIAGNOSIS — F209 Schizophrenia, unspecified: Secondary | ICD-10-CM | POA: Diagnosis not present

## 2016-02-16 DIAGNOSIS — R2689 Other abnormalities of gait and mobility: Secondary | ICD-10-CM | POA: Diagnosis not present

## 2016-02-21 DIAGNOSIS — R2689 Other abnormalities of gait and mobility: Secondary | ICD-10-CM | POA: Diagnosis not present

## 2016-02-21 DIAGNOSIS — F209 Schizophrenia, unspecified: Secondary | ICD-10-CM | POA: Diagnosis not present

## 2016-02-21 DIAGNOSIS — F419 Anxiety disorder, unspecified: Secondary | ICD-10-CM | POA: Diagnosis not present

## 2016-02-21 DIAGNOSIS — J449 Chronic obstructive pulmonary disease, unspecified: Secondary | ICD-10-CM | POA: Diagnosis not present

## 2016-02-21 DIAGNOSIS — F3175 Bipolar disorder, in partial remission, most recent episode depressed: Secondary | ICD-10-CM | POA: Diagnosis not present

## 2016-02-21 DIAGNOSIS — Z9181 History of falling: Secondary | ICD-10-CM | POA: Diagnosis not present

## 2016-02-23 DIAGNOSIS — R2689 Other abnormalities of gait and mobility: Secondary | ICD-10-CM | POA: Diagnosis not present

## 2016-02-23 DIAGNOSIS — F419 Anxiety disorder, unspecified: Secondary | ICD-10-CM | POA: Diagnosis not present

## 2016-02-23 DIAGNOSIS — J449 Chronic obstructive pulmonary disease, unspecified: Secondary | ICD-10-CM | POA: Diagnosis not present

## 2016-02-23 DIAGNOSIS — F209 Schizophrenia, unspecified: Secondary | ICD-10-CM | POA: Diagnosis not present

## 2016-02-23 DIAGNOSIS — F3175 Bipolar disorder, in partial remission, most recent episode depressed: Secondary | ICD-10-CM | POA: Diagnosis not present

## 2016-02-23 DIAGNOSIS — Z9181 History of falling: Secondary | ICD-10-CM | POA: Diagnosis not present

## 2016-02-27 DIAGNOSIS — F419 Anxiety disorder, unspecified: Secondary | ICD-10-CM | POA: Diagnosis not present

## 2016-02-27 DIAGNOSIS — R2689 Other abnormalities of gait and mobility: Secondary | ICD-10-CM | POA: Diagnosis not present

## 2016-02-27 DIAGNOSIS — Z9181 History of falling: Secondary | ICD-10-CM | POA: Diagnosis not present

## 2016-02-27 DIAGNOSIS — F3175 Bipolar disorder, in partial remission, most recent episode depressed: Secondary | ICD-10-CM | POA: Diagnosis not present

## 2016-02-27 DIAGNOSIS — J449 Chronic obstructive pulmonary disease, unspecified: Secondary | ICD-10-CM | POA: Diagnosis not present

## 2016-02-27 DIAGNOSIS — F209 Schizophrenia, unspecified: Secondary | ICD-10-CM | POA: Diagnosis not present

## 2016-02-29 ENCOUNTER — Other Ambulatory Visit: Payer: Self-pay | Admitting: Family Medicine

## 2016-02-29 DIAGNOSIS — F209 Schizophrenia, unspecified: Secondary | ICD-10-CM | POA: Diagnosis not present

## 2016-02-29 DIAGNOSIS — M1990 Unspecified osteoarthritis, unspecified site: Secondary | ICD-10-CM

## 2016-02-29 MED ORDER — HYDROCODONE-ACETAMINOPHEN 5-325 MG PO TABS: 1.0000 | ORAL_TABLET | Freq: Three times a day (TID) | ORAL | 0 refills | Status: DC

## 2016-02-29 NOTE — Telephone Encounter (Signed)
Pt contacted office for refill request on the following medications:  HYDROcodone-acetaminophen (NORCO/VICODIN) 5-325 MG tablet.  [phone]/[phone]/MW

## 2016-03-01 DIAGNOSIS — F3175 Bipolar disorder, in partial remission, most recent episode depressed: Secondary | ICD-10-CM | POA: Diagnosis not present

## 2016-03-01 DIAGNOSIS — F419 Anxiety disorder, unspecified: Secondary | ICD-10-CM | POA: Diagnosis not present

## 2016-03-01 DIAGNOSIS — Z9181 History of falling: Secondary | ICD-10-CM | POA: Diagnosis not present

## 2016-03-01 DIAGNOSIS — F209 Schizophrenia, unspecified: Secondary | ICD-10-CM | POA: Diagnosis not present

## 2016-03-01 DIAGNOSIS — R2689 Other abnormalities of gait and mobility: Secondary | ICD-10-CM | POA: Diagnosis not present

## 2016-03-01 DIAGNOSIS — J449 Chronic obstructive pulmonary disease, unspecified: Secondary | ICD-10-CM | POA: Diagnosis not present

## 2016-03-06 DIAGNOSIS — F209 Schizophrenia, unspecified: Secondary | ICD-10-CM | POA: Diagnosis not present

## 2016-03-06 DIAGNOSIS — F419 Anxiety disorder, unspecified: Secondary | ICD-10-CM | POA: Diagnosis not present

## 2016-03-06 DIAGNOSIS — Z9181 History of falling: Secondary | ICD-10-CM | POA: Diagnosis not present

## 2016-03-06 DIAGNOSIS — J449 Chronic obstructive pulmonary disease, unspecified: Secondary | ICD-10-CM | POA: Diagnosis not present

## 2016-03-06 DIAGNOSIS — F3175 Bipolar disorder, in partial remission, most recent episode depressed: Secondary | ICD-10-CM | POA: Diagnosis not present

## 2016-03-06 DIAGNOSIS — R2689 Other abnormalities of gait and mobility: Secondary | ICD-10-CM | POA: Diagnosis not present

## 2016-03-12 ENCOUNTER — Telehealth: Payer: Self-pay | Admitting: Family Medicine

## 2016-03-12 DIAGNOSIS — F419 Anxiety disorder, unspecified: Secondary | ICD-10-CM | POA: Diagnosis not present

## 2016-03-12 DIAGNOSIS — J449 Chronic obstructive pulmonary disease, unspecified: Secondary | ICD-10-CM | POA: Diagnosis not present

## 2016-03-12 DIAGNOSIS — Z9181 History of falling: Secondary | ICD-10-CM | POA: Diagnosis not present

## 2016-03-12 DIAGNOSIS — F3175 Bipolar disorder, in partial remission, most recent episode depressed: Secondary | ICD-10-CM | POA: Diagnosis not present

## 2016-03-12 DIAGNOSIS — F209 Schizophrenia, unspecified: Secondary | ICD-10-CM | POA: Diagnosis not present

## 2016-03-12 DIAGNOSIS — R2689 Other abnormalities of gait and mobility: Secondary | ICD-10-CM | POA: Diagnosis not present

## 2016-03-12 NOTE — Telephone Encounter (Signed)
Vernona RiegerLaura with [facility] called states pt is having trouble swallowing food and is requesting an appointment with Dr Sherrie MustacheFisher.  Can pt be worked in Thursday or Friday?  CB#[phone]/MW

## 2016-03-13 NOTE — Telephone Encounter (Signed)
Please advise 

## 2016-03-13 NOTE — Telephone Encounter (Signed)
Spoke with Vernona RiegerLaura with [facility]. Vernona RiegerLaura stated that Wednesday is grocery day and she can't bring pt in the office. Pt is scheduled for Thursday @ 945. Thanks TNP

## 2016-03-13 NOTE — Telephone Encounter (Signed)
Wednesday is better, I have an opening in the afternoon or the 10:45 can be used. If they can't make it tomorrow can have the 9:45 same day appointment Thursday.

## 2016-03-15 ENCOUNTER — Encounter: Payer: Self-pay | Admitting: Family Medicine

## 2016-03-15 ENCOUNTER — Ambulatory Visit (INDEPENDENT_AMBULATORY_CARE_PROVIDER_SITE_OTHER): Payer: Medicare Other | Admitting: Family Medicine

## 2016-03-15 VITALS — BP 100/60 | HR 115 | Resp 16 | Wt 182.0 lb

## 2016-03-15 DIAGNOSIS — R1310 Dysphagia, unspecified: Secondary | ICD-10-CM | POA: Diagnosis not present

## 2016-03-15 NOTE — Progress Notes (Signed)
Patient: Diana Mccoy Female    DOB: Jun 18, 1946   70 y.o.   MRN: [id number] Visit Date: 03/15/2016  Today's Provider: Mila Merry, MD   Chief Complaint  Patient presents with  . Dysphagia   Subjective:    HPI Dysphagia: Patient presents with dysphagia. The patient describes multiple episode(s) with sudden onset, located in the high throat, and symptoms lasting 2 weeks. Patient reports nothing has become stuck, but feels a lump sensation when swallowing. Associated with the dysphagia, patient also complains of no other symptoms. Patient denies heartburn.  Patient reports regurgitation of undigested food.       Allergies  Allergen Reactions  . Ciprofloxacin Nausea And Vomiting  . Penicillins     rash, hives  . Sodium Thiosulfate   . Sulfa Antibiotics      Current Outpatient Prescriptions:  .  acetaminophen (TYLENOL) 325 MG tablet, Take 650 mg by mouth., Disp: , Rfl:  .  albuterol (PROVENTIL) (2.5 MG/3ML) 0.083% nebulizer solution, Take 3 mLs (2.5 mg total) by nebulization every 4 (four) hours as needed for wheezing or shortness of breath., Disp: 360 mL, Rfl: 12 .  albuterol (VENTOLIN HFA) 108 (90 BASE) MCG/ACT inhaler, Inhale 2 puffs into the lungs every 4 (four) hours as needed. , Disp: , Rfl:  .  alendronate (FOSAMAX) 70 MG tablet, Take 70 mg by mouth once a week. , Disp: , Rfl:  .  buPROPion (WELLBUTRIN SR) 150 MG 12 hr tablet, Take 150 mg by mouth 2 (two) times daily. , Disp: , Rfl:  .  Calcium Carb-Cholecalciferol (OYSTER SHELL CALCIUM PLUS D) 500-200 MG-UNIT TABS, Take 1 tablet by mouth 3 (three) times daily. , Disp: , Rfl:  .  clonazePAM (KLONOPIN) 0.5 MG tablet, Take 1 tablet (0.5 mg total) by mouth 3 (three) times daily., Disp: 90 tablet, Rfl: 5 .  docusate sodium (COLACE) 100 MG capsule, Take 100 mg by mouth 2 (two) times daily., Disp: , Rfl:  .  esomeprazole (NEXIUM) 40 MG capsule, Take 1 capsule (40 mg total) by mouth daily at 12 noon., Disp: 90 capsule, Rfl:  4 .  ferrous sulfate 325 (65 FE) MG tablet, Take 325 mg by mouth 2 (two) times daily with a meal. , Disp: , Rfl:  .  glycopyrrolate (ROBINUL) 2 MG tablet, Take 2 mg by mouth 3 (three) times daily. Reported on 10/20/2015, Disp: , Rfl:  .  HYDROcodone-acetaminophen (NORCO/VICODIN) 5-325 MG tablet, Take 1 tablet by mouth 3 (three) times daily., Disp: 90 tablet, Rfl: 0 .  loratadine (CLARITIN) 10 MG tablet, Take 10 mg by mouth daily. , Disp: , Rfl:  .  Magnesium Hydroxide (MILK OF MAGNESIA CONCENTRATE) 2400 MG/10ML SUSP, Take 30 mLs by mouth. , Disp: , Rfl:  .  meloxicam (MOBIC) 7.5 MG tablet, Take 7.5 mg by mouth 2 (two) times daily., Disp: , Rfl:  .  nystatin ointment (MYCOSTATIN), NYSTATIN, 100000 UNIT/GM (External Ointment)  1 Ointment Ointment Apply to affected area twice a day for 0 days  Quantity: 60;  Refills: 0   Ordered :14-Aug-2013  Lorie Phenix MD;  Started 14-Aug-2013 Active, Disp: , Rfl:  .  paliperidone (INVEGA SUSTENNA) 156 MG/ML SUSP injection, Inject 156 mg into the muscle every 30 (thirty) days., Disp: , Rfl:  .  tiotropium (SPIRIVA HANDIHALER) 18 MCG inhalation capsule, Place 1 capsule (18 mcg total) into inhaler and inhale daily., Disp: 30 capsule, Rfl: 12 .  traZODone (DESYREL) 100 MG tablet, Take 100 mg by  mouth at bedtime., Disp: , Rfl:  .  trimethoprim (TRIMPEX) 100 MG tablet, TAKE ONE TABLET BY MOUTH EVERY DAY, Disp: 90 tablet, Rfl: 1 .  Vitamin D, Ergocalciferol, (DRISDOL) 50000 UNITS CAPS capsule, Take 50,000 Units by mouth every 30 (thirty) days. , Disp: , Rfl:  .  zonisamide (ZONEGRAN) 100 MG capsule, Take 200 mg by mouth daily. , Disp: , Rfl:   Review of Systems  Constitutional: Negative.   Respiratory: Positive for cough and choking.   Cardiovascular: Negative.     Social History  Substance Use Topics  . Smoking status: Current Every Day Smoker    Packs/day: 1.00    Years: 30.00  . Smokeless tobacco: Never Used  . Alcohol use No   Objective:   BP 100/60 (BP  Location: Left Arm, Patient Position: Sitting, Cuff Size: Large)   Pulse (!) 115   Resp 16   Wt 182 lb (82.6 kg) Comment: per pt wt was checked at [facility]  SpO2 91%   BMI 30.29 kg/m   Physical Exam  General Appearance:    Alert, cooperative, no distress  HENT:   ENT exam normal, no neck nodes or sinus tenderness  Eyes:    PERRL, conjunctiva/corneas clear, EOM's intact       Lungs:     Clear to auscultation bilaterally, respirations unlabored  Heart:    Regular rate and rhythm  Neurologic:   Awake, alert, oriented x 3. No apparent focal neurological           defect.           Assessment & Plan:     1. Dysphagia  - SLP modified barium swallow; Future     The entirety of the information documented in the History of Present Illness, Review of Systems and Physical Exam were personally obtained by me. Portions of this information were initially documented by Rondel BatonSulibeya Dimas, CMA and reviewed by me for thoroughness and accuracy.    Mila Merryonald Fisher, MD  [facility]

## 2016-03-16 DIAGNOSIS — F209 Schizophrenia, unspecified: Secondary | ICD-10-CM | POA: Diagnosis not present

## 2016-03-16 DIAGNOSIS — F3175 Bipolar disorder, in partial remission, most recent episode depressed: Secondary | ICD-10-CM | POA: Diagnosis not present

## 2016-03-16 DIAGNOSIS — F419 Anxiety disorder, unspecified: Secondary | ICD-10-CM | POA: Diagnosis not present

## 2016-03-16 DIAGNOSIS — Z9181 History of falling: Secondary | ICD-10-CM | POA: Diagnosis not present

## 2016-03-16 DIAGNOSIS — J449 Chronic obstructive pulmonary disease, unspecified: Secondary | ICD-10-CM | POA: Diagnosis not present

## 2016-03-16 DIAGNOSIS — R2689 Other abnormalities of gait and mobility: Secondary | ICD-10-CM | POA: Diagnosis not present

## 2016-03-19 DIAGNOSIS — F419 Anxiety disorder, unspecified: Secondary | ICD-10-CM | POA: Diagnosis not present

## 2016-03-19 DIAGNOSIS — Z9181 History of falling: Secondary | ICD-10-CM | POA: Diagnosis not present

## 2016-03-19 DIAGNOSIS — R2689 Other abnormalities of gait and mobility: Secondary | ICD-10-CM | POA: Diagnosis not present

## 2016-03-19 DIAGNOSIS — J449 Chronic obstructive pulmonary disease, unspecified: Secondary | ICD-10-CM | POA: Diagnosis not present

## 2016-03-19 DIAGNOSIS — F209 Schizophrenia, unspecified: Secondary | ICD-10-CM | POA: Diagnosis not present

## 2016-03-19 DIAGNOSIS — F3175 Bipolar disorder, in partial remission, most recent episode depressed: Secondary | ICD-10-CM | POA: Diagnosis not present

## 2016-03-20 ENCOUNTER — Other Ambulatory Visit: Payer: Self-pay | Admitting: Family Medicine

## 2016-03-20 DIAGNOSIS — R1310 Dysphagia, unspecified: Secondary | ICD-10-CM

## 2016-03-20 DIAGNOSIS — M1990 Unspecified osteoarthritis, unspecified site: Secondary | ICD-10-CM

## 2016-03-20 MED ORDER — HYDROCODONE-ACETAMINOPHEN 5-325 MG PO TABS
1.0000 | ORAL_TABLET | Freq: Three times a day (TID) | ORAL | 0 refills | Status: DC
Start: 2016-03-20 — End: 2016-04-23

## 2016-03-20 NOTE — Telephone Encounter (Signed)
Pt needs refill on her hydrocodone.  Please call [facility] for her daughter to pick up RX when ready.  Thanks Barth Kirkseri

## 2016-03-20 NOTE — Telephone Encounter (Signed)
Notified rx is ready

## 2016-03-21 DIAGNOSIS — F209 Schizophrenia, unspecified: Secondary | ICD-10-CM | POA: Diagnosis not present

## 2016-03-22 DIAGNOSIS — F209 Schizophrenia, unspecified: Secondary | ICD-10-CM | POA: Diagnosis not present

## 2016-03-22 DIAGNOSIS — R2689 Other abnormalities of gait and mobility: Secondary | ICD-10-CM | POA: Diagnosis not present

## 2016-03-22 DIAGNOSIS — J449 Chronic obstructive pulmonary disease, unspecified: Secondary | ICD-10-CM | POA: Diagnosis not present

## 2016-03-22 DIAGNOSIS — Z9181 History of falling: Secondary | ICD-10-CM | POA: Diagnosis not present

## 2016-03-22 DIAGNOSIS — F419 Anxiety disorder, unspecified: Secondary | ICD-10-CM | POA: Diagnosis not present

## 2016-03-22 DIAGNOSIS — F3175 Bipolar disorder, in partial remission, most recent episode depressed: Secondary | ICD-10-CM | POA: Diagnosis not present

## 2016-03-26 DIAGNOSIS — F3175 Bipolar disorder, in partial remission, most recent episode depressed: Secondary | ICD-10-CM | POA: Diagnosis not present

## 2016-03-26 DIAGNOSIS — Z9181 History of falling: Secondary | ICD-10-CM | POA: Diagnosis not present

## 2016-03-26 DIAGNOSIS — J449 Chronic obstructive pulmonary disease, unspecified: Secondary | ICD-10-CM | POA: Diagnosis not present

## 2016-03-26 DIAGNOSIS — R2689 Other abnormalities of gait and mobility: Secondary | ICD-10-CM | POA: Diagnosis not present

## 2016-03-26 DIAGNOSIS — F419 Anxiety disorder, unspecified: Secondary | ICD-10-CM | POA: Diagnosis not present

## 2016-03-26 DIAGNOSIS — F209 Schizophrenia, unspecified: Secondary | ICD-10-CM | POA: Diagnosis not present

## 2016-04-04 DIAGNOSIS — F3175 Bipolar disorder, in partial remission, most recent episode depressed: Secondary | ICD-10-CM | POA: Diagnosis not present

## 2016-04-04 DIAGNOSIS — F419 Anxiety disorder, unspecified: Secondary | ICD-10-CM | POA: Diagnosis not present

## 2016-04-04 DIAGNOSIS — F209 Schizophrenia, unspecified: Secondary | ICD-10-CM | POA: Diagnosis not present

## 2016-04-04 DIAGNOSIS — J449 Chronic obstructive pulmonary disease, unspecified: Secondary | ICD-10-CM | POA: Diagnosis not present

## 2016-04-04 DIAGNOSIS — Z9181 History of falling: Secondary | ICD-10-CM | POA: Diagnosis not present

## 2016-04-04 DIAGNOSIS — R2689 Other abnormalities of gait and mobility: Secondary | ICD-10-CM | POA: Diagnosis not present

## 2016-04-05 ENCOUNTER — Ambulatory Visit
Admission: RE | Admit: 2016-04-05 | Discharge: 2016-04-05 | Disposition: A | Discharge status: Home / Self Care | Payer: Medicare Other | Source: Ambulatory Visit | Attending: Family Medicine | Admitting: Family Medicine

## 2016-04-05 ENCOUNTER — Other Ambulatory Visit: Payer: Self-pay | Admitting: Family Medicine

## 2016-04-05 DIAGNOSIS — R1310 Dysphagia, unspecified: Secondary | ICD-10-CM | POA: Insufficient documentation

## 2016-04-05 DIAGNOSIS — R1314 Dysphagia, pharyngoesophageal phase: Secondary | ICD-10-CM

## 2016-04-05 NOTE — Therapy (Addendum)
[facility] DIAGNOSTIC RADIOLOGY [address] [place], [place], [address] Phone: [phone]   Fax:     Modified Barium Swallow  Patient Details  Name: Diana Charolotte EkeM Mogan MRN: [id number] Date of Birth: 06/14/1946 No Data Recorded  Encounter Date: 04/05/2016   Subjective: Patient behavior: (alertness, ability to follow instructions, etc.): The patient described multiple episode(s) of a "choking" feeling when eating certain foods (ckicken, barbeque); located high in the Esophagus (pt pointed to sternal notch area). Patient indicated this "feeling" has bothered her for more than 2+ months. She feels a lump sensation (globus) when swallowing. Patient denied heartburn to MD. Patient reported regurgitation of undigested food. Patient is on Nexium (since May per caregiver). Pt denied any trouble swallowing juices and tea, just foods.  Pt does wear upper and lower dentures when eating; appeared min loose fitting.  Pt is a resident at an AL. Reviewed chart notes of medical history which includes several medical dxs.  Chief complaint: dysphagia   Objective:  Radiological Procedure: A videoflouroscopic evaluation of oral-preparatory, reflex initiation, and pharyngeal phases of the swallow was performed; as well as a screening of the upper esophageal phase.  I. POSTURE: upright II. VIEW: lateral III. COMPENSATORY STRATEGIES: f/u, dry swallow intermittently IV. BOLUSES ADMINISTERED:  Thin Liquid: 5 trials  Nectar-thick Liquid: NT  Honey-thick Liquid: NT  Puree: 2 trials  Mechanical Soft: 2 trials V. RESULTS OF EVALUATION: A. ORAL PREPARATORY PHASE: (The lips, tongue, and velum are observed for strength and coordination)       **Overall Severity Rating: grossly WFL.   B. SWALLOW INITIATION/REFLEX: (The reflex is normal if "triggered" by the time the bolus reached the base of the tongue)  **[facility]   C. PHARYNGEAL PHASE: (Pharyngeal function  is normal if the bolus shows rapid, smooth, and continuous transit through the pharynx and there is no pharyngeal residue after the swallow)  **Overall Severity Rating: grossly WFL. Slight-min residue remaining intermittently(valleculae); cleared w/ f/u, dry swallow.  D. LARYNGEAL PENETRATION: (Material entering into the laryngeal inlet/vestibule but not aspirated): NONE  E. ASPIRATION: NONE F. 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 Noted dispersed bolus material, dysmotility, and slower clearing. With time, and alternating food/liquid trials, bolus reduction/clearing in the Esophagus was noted given TIME.   ASSESSMENT: Pt appeared to present w/ adequate oropharyngeal phase function of swallowing w/ adequate timing of the pharyngeal swallow, and NO laryngeal penetration or aspiration noted during this study. Oral phase was grossly wfl for bolus management and A-P transfer, however, noted slight bolus residue around lower dentures; lower dentures appeared min loose-fitting. Discussed the importance of securing lower dentures when eating/masticating (solids) for safe oral intake (using adhesive?) w/ Pt and Caregiver present. Noted slight-min pharyngeal residue remaining intermittently which reduced and/or cleared w/ a f/u, dry swallow spontaneously. Suspect the pharyngeal residue could be a result of the impact of the Esophageal phase dysphagia/dysmotility noted w/ liquid and food trials given. Alternating food/liquid boluses and TIME appeared to aid in reducing/clearing bolus material in the Esophagus. Any dysmotility, or Esophageal phase dysphagia, can impact the oropharyngeal phase of swallowing and result in a cough response, globus feelings, and/or regurgitation - these are issues Pt has c/o experiencing.  Education given on general aspiration and REFLUX precautions. Recommend f/u w/ GI re: Esophageal dysmotility and  medication regimen as Radiologist indicated Presbyesophagus during this study.    PLAN/RECOMMENDATIONS:  A. Diet: Mech Soft/Regluar diet -  meats cut/chopped well and moistened; Thin liquids.  Medications given in Puree for easier swallowing if needed.  More moist foods in diet including soups; less heavy meats and breads if causing difficulty for pt.   B. Swallowing Precautions: general aspiration precautions; REFLUX precautions.   C. Recommended consultation to: GI for f/u; tx regimen; education  D. Therapy recommendations: No skilled ST services indicated  E. Results and recommendations were discussed w/ Pt, Caregiver present w/ pt      End of Session - April 27, 2016 1348    Visit Number 1   Number of Visits 1   Date for SLP Re-Evaluation Apr 27, 2016   SLP Start Time 1245   SLP Stop Time  1345   SLP Time Calculation (min) 60 min   Activity Tolerance Patient tolerated treatment well      No past medical history on file.  Past Surgical History:  Procedure Laterality Date  . HIP FRACTURE SURGERY Left     There were no vitals filed for this visit.              Dysphagia - Plan: DG OP Swallowing Func-Medicare/Speech Path, DG OP Swallowing Func-Medicare/Speech Path  Pharyngoesophageal dysphagia      G-Codes - Apr 27, 2016 1348    Functional Assessment Tool Used clinical judegement   Functional Limitations Swallowing   Swallow Current Status (R6045) At least 1 percent but less than 20 percent impaired, limited or restricted   Swallow Goal Status (W0981) At least 1 percent but less than 20 percent impaired, limited or restricted   Swallow Discharge Status [phone]) At least 1 percent but less than 20 percent impaired, limited or restricted          Problem List Patient Active Problem List   Diagnosis Date Noted  . COPD (chronic obstructive pulmonary disease) (HCC) 01/19/2016  . COPD exacerbation (HCC) 10/20/2015  . Gait instability 04/18/2015  . Schizophrenia (HCC)  03/28/2015  . Depression 03/28/2015  . Osteoarthritis 03/28/2015  . Elevated WBC count 01/31/2015  . Anxiety 12/28/2014  . 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 CAFL (chronic airflow limitation) (HCC) 12/28/2014  . 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 Acid reflux 12/28/2014  . BP (high blood pressure) 12/28/2014  . Disorder of kidney 12/28/2014  . Scoliosis 12/28/2014  . Spinal stenosis 12/28/2014     Jerilynn Som, MS, CCC-SLP Watson,Katherine 2016-04-27, 1:49 PM  [facility] DIAGNOSTIC RADIOLOGY [address] [place], [place], [address] Phone: [phone]   Fax:     Name: Ahmari JOHNA KEARL MRN: [id number] Date of Birth: 1945/09/19

## 2016-04-06 ENCOUNTER — Other Ambulatory Visit: Payer: Self-pay | Admitting: Family Medicine

## 2016-04-06 DIAGNOSIS — F419 Anxiety disorder, unspecified: Secondary | ICD-10-CM

## 2016-04-06 MED ORDER — CLONAZEPAM 0.5 MG PO TABS: 0.5000 mg | ORAL_TABLET | Freq: Three times a day (TID) | ORAL | 5 refills | Status: DC

## 2016-04-06 NOTE — Telephone Encounter (Signed)
Tresa EndoKelly with [facility] would like a Rx for clonazePAM (KLONOPIN) 0.5 MG tablet faxed to [facility] and someone will pick up the hard copy Monday.  Last written: 08/08/15 with 5 refills Please advise. Thanks TNP

## 2016-04-06 NOTE — Telephone Encounter (Signed)
[facility] and Tresa EndoKelly was out but [place] was not sure why we could not just call it in. I called [facility] and was able to call it in for the patient.-aa

## 2016-04-06 NOTE — Telephone Encounter (Signed)
Why can't we just call this in?

## 2016-04-06 NOTE — Telephone Encounter (Signed)
Please review-aa 

## 2016-04-06 NOTE — Telephone Encounter (Signed)
Please review. Thanks!  

## 2016-04-16 ENCOUNTER — Encounter: Payer: Self-pay | Admitting: Family Medicine

## 2016-04-16 ENCOUNTER — Telehealth: Payer: Self-pay | Admitting: Family Medicine

## 2016-04-16 DIAGNOSIS — K228 Other specified diseases of esophagus: Secondary | ICD-10-CM | POA: Insufficient documentation

## 2016-04-16 DIAGNOSIS — K2289 Other specified disease of esophagus: Secondary | ICD-10-CM

## 2016-04-16 HISTORY — DX: Other specified disease of esophagus: K22.89

## 2016-04-16 NOTE — Telephone Encounter (Signed)
Please advise barium swallow shows esophagus is not pushing food down effective. She needs referral to GI for further and evaluation of this. Have entered order for referral.

## 2016-04-16 NOTE — Telephone Encounter (Signed)
Nurse notified at patient's facility. Nurse stated that they will relay the message to the pt.

## 2016-04-23 ENCOUNTER — Other Ambulatory Visit: Payer: Self-pay | Admitting: Family Medicine

## 2016-04-23 DIAGNOSIS — M1990 Unspecified osteoarthritis, unspecified site: Secondary | ICD-10-CM

## 2016-04-23 MED ORDER — HYDROCODONE-ACETAMINOPHEN 5-325 MG PO TABS: 1.0000 | ORAL_TABLET | Freq: Three times a day (TID) | ORAL | 0 refills | Status: DC

## 2016-04-23 NOTE — Telephone Encounter (Signed)
Rx request has already been responded to. 

## 2016-04-23 NOTE — Telephone Encounter (Signed)
Pt called stating she needs a refill on her hydrocodone 5/325  Please call when ready to be picked.  Thank sTeri

## 2016-04-23 NOTE — Telephone Encounter (Signed)
Rose with [facility] called to see if Rx for HYDROcodone-acetaminophen (NORCO/VICODIN) 5-325 MG tablet was ready. Please advise. Thanks TNP

## 2016-04-25 DIAGNOSIS — F209 Schizophrenia, unspecified: Secondary | ICD-10-CM | POA: Diagnosis not present

## 2016-04-26 ENCOUNTER — Encounter: Payer: Self-pay | Admitting: Family Medicine

## 2016-04-26 ENCOUNTER — Ambulatory Visit (INDEPENDENT_AMBULATORY_CARE_PROVIDER_SITE_OTHER): Payer: Medicare Other | Admitting: Family Medicine

## 2016-04-26 VITALS — BP 126/78 | HR 84 | Temp 98.6°F | Resp 16

## 2016-04-26 DIAGNOSIS — M1990 Unspecified osteoarthritis, unspecified site: Secondary | ICD-10-CM | POA: Diagnosis not present

## 2016-04-26 DIAGNOSIS — J449 Chronic obstructive pulmonary disease, unspecified: Secondary | ICD-10-CM | POA: Diagnosis not present

## 2016-04-26 DIAGNOSIS — K228 Other specified diseases of esophagus: Secondary | ICD-10-CM | POA: Diagnosis not present

## 2016-04-26 DIAGNOSIS — K2289 Other specified disease of esophagus: Secondary | ICD-10-CM

## 2016-04-26 DIAGNOSIS — F2089 Other schizophrenia: Secondary | ICD-10-CM | POA: Diagnosis not present

## 2016-04-26 MED ORDER — HYDROCODONE-ACETAMINOPHEN 7.5-325 MG PO TABS: 1.0000 | ORAL_TABLET | Freq: Four times a day (QID) | ORAL | 0 refills | Status: DC | PRN

## 2016-04-26 NOTE — Progress Notes (Signed)
Patient: Diana Mccoy Female    DOB: 12/09/1945   70 y.o.   MRN: [id number] Visit Date: 04/26/2016  Today's Provider: Mila Merryonald Eleny Cortez, MD   Chief Complaint  Patient presents with  . COPD   Subjective:    HPI Complains of persistent pain in left leg and spine. Is taking hydrocodone/apap 3 times a day, but not helping much.   Patient comes in today for a follow up for COPD. Patient's last visit was 6 months ago. Since then she has started Spiriva. Patient reports that she is tolerating medications well. She also mentions that she has to also use a nebulizer, and she needs a prescription refill.     Allergies  Allergen Reactions  . Ciprofloxacin Nausea And Vomiting  . Penicillins     rash, hives  . Sodium Thiosulfate   . Sulfa Antibiotics      Current Outpatient Prescriptions:  .  acetaminophen (TYLENOL) 325 MG tablet, Take 650 mg by mouth., Disp: , Rfl:  .  albuterol (PROVENTIL) (2.5 MG/3ML) 0.083% nebulizer solution, Take 3 mLs (2.5 mg total) by nebulization every 4 (four) hours as needed for wheezing or shortness of breath., Disp: 360 mL, Rfl: 12 .  albuterol (VENTOLIN HFA) 108 (90 BASE) MCG/ACT inhaler, Inhale 2 puffs into the lungs every 4 (four) hours as needed. , Disp: , Rfl:  .  alendronate (FOSAMAX) 70 MG tablet, Take 70 mg by mouth once a week. , Disp: , Rfl:  .  buPROPion (WELLBUTRIN SR) 150 MG 12 hr tablet, TAKE 1 TABLET BY MOUTH TWO TIMES A DAY, Disp: 60 tablet, Rfl: 11 .  Calcium Carb-Cholecalciferol (OYSTER SHELL CALCIUM PLUS D) 500-200 MG-UNIT TABS, Take 1 tablet by mouth 3 (three) times daily. , Disp: , Rfl:  .  clonazePAM (KLONOPIN) 0.5 MG tablet, Take 1 tablet (0.5 mg total) by mouth 3 (three) times daily., Disp: 90 tablet, Rfl: 5 .  docusate sodium (COLACE) 100 MG capsule, Take 100 mg by mouth 2 (two) times daily., Disp: , Rfl:  .  esomeprazole (NEXIUM) 40 MG capsule, Take 1 capsule (40 mg total) by mouth daily at 12 noon., Disp: 90 capsule, Rfl: 4 .   ferrous sulfate 325 (65 FE) MG tablet, Take 325 mg by mouth 2 (two) times daily with a meal. , Disp: , Rfl:  .  glycopyrrolate (ROBINUL) 2 MG tablet, Take 2 mg by mouth 3 (three) times daily. Reported on 10/20/2015, Disp: , Rfl:  .  HYDROcodone-acetaminophen (NORCO/VICODIN) 5-325 MG tablet, Take 1 tablet by mouth 3 (three) times daily., Disp: 90 tablet, Rfl: 0 .  loratadine (CLARITIN) 10 MG tablet, Take 10 mg by mouth daily. , Disp: , Rfl:  .  Magnesium Hydroxide (MILK OF MAGNESIA CONCENTRATE) 2400 MG/10ML SUSP, Take 30 mLs by mouth. , Disp: , Rfl:  .  meloxicam (MOBIC) 7.5 MG tablet, Take 7.5 mg by mouth 2 (two) times daily., Disp: , Rfl:  .  mometasone (ELOCON) 0.1 % cream, Apply 1 application topically daily., Disp: , Rfl:  .  nystatin ointment (MYCOSTATIN), NYSTATIN, 100000 UNIT/GM (External Ointment)  1 Ointment Ointment Apply to affected area twice a day for 0 days  Quantity: 60;  Refills: 0   Ordered :14-Aug-2013  Lorie PhenixMaloney, Nancy MD;  Started 14-Aug-2013 Active, Disp: , Rfl:  .  paliperidone (INVEGA SUSTENNA) 156 MG/ML SUSP injection, Inject 156 mg into the muscle every 30 (thirty) days., Disp: , Rfl:  .  tiotropium (SPIRIVA HANDIHALER) 18 MCG inhalation capsule,  Place 1 capsule (18 mcg total) into inhaler and inhale daily., Disp: 30 capsule, Rfl: 12 .  traZODone (DESYREL) 100 MG tablet, Take 100 mg by mouth at bedtime., Disp: , Rfl:  .  trimethoprim (TRIMPEX) 100 MG tablet, TAKE ONE TABLET BY MOUTH EVERY DAY, Disp: 90 tablet, Rfl: 1 .  Vitamin D, Ergocalciferol, (DRISDOL) 50000 UNITS CAPS capsule, Take 50,000 Units by mouth every 30 (thirty) days. , Disp: , Rfl:  .  zonisamide (ZONEGRAN) 100 MG capsule, Take 200 mg by mouth daily. , Disp: , Rfl:   Review of Systems  Constitutional: Negative.   Respiratory: Positive for shortness of breath. Negative for apnea, cough, choking, chest tightness, wheezing and stridor.        Has COPD.   Cardiovascular: Negative.   Musculoskeletal: Positive for  arthralgias, gait problem and myalgias.  Psychiatric/Behavioral: Negative.     Social History  Substance Use Topics  . Smoking status: Current Every Day Smoker    Packs/day: 1.00    Years: 30.00  . Smokeless tobacco: Never Used  . Alcohol use No   Objective:   BP 126/78 (BP Location: Left Arm, Patient Position: Sitting, Cuff Size: Large)   Pulse 84   Temp 98.6 F (37 C)   Resp 16   Physical Exam  General Appearance:    Alert, cooperative, no distress, obese  Eyes:    PERRL, conjunctiva/corneas clear, EOM's intact       Lungs:     Clear to auscultation bilaterally, respirations unlabored  Heart:    Regular rate and rhythm  Neurologic:   Awake, alert, oriented x 3. No apparent focal neurological           defect.           Assessment & Plan:     1. Arthritis Worsening pain. Increase hydrocodone from 5 to 7.5 - HYDROcodone-acetaminophen (NORCO) 7.5-325 MG tablet; Take 1 tablet by mouth every 6 (six) hours as needed for moderate pain.  Dispense: 90 tablet; Refill: 0  2. Presbyesophagus Scheduled for GI evaluation with Dr. Servando Snare 05-16-16   3. Other schizophrenia (HCC) Stable on current psychiatric medications.   4. Chronic obstructive pulmonary disease, unspecified COPD type (HCC) Continue current regiment of inhalers.   She is scheduled to have flu vaccine at her living facility at end of month.       Mila Merry, MD  [facility]

## 2016-04-27 DIAGNOSIS — Z23 Encounter for immunization: Secondary | ICD-10-CM | POA: Diagnosis not present

## 2016-05-04 ENCOUNTER — Other Ambulatory Visit: Payer: Self-pay | Admitting: *Deleted

## 2016-05-04 DIAGNOSIS — M1990 Unspecified osteoarthritis, unspecified site: Secondary | ICD-10-CM

## 2016-05-04 MED ORDER — HYDROCODONE-ACETAMINOPHEN 7.5-325 MG PO TABS: 1.0000 | ORAL_TABLET | Freq: Four times a day (QID) | ORAL | 0 refills | Status: DC | PRN

## 2016-05-14 ENCOUNTER — Ambulatory Visit (INDEPENDENT_AMBULATORY_CARE_PROVIDER_SITE_OTHER): Payer: Medicare Other | Admitting: Gastroenterology

## 2016-05-14 VITALS — BP 142/58 | HR 109 | Temp 98.1°F | Ht 65.0 in | Wt 183.0 lb

## 2016-05-14 DIAGNOSIS — R1310 Dysphagia, unspecified: Secondary | ICD-10-CM

## 2016-05-14 DIAGNOSIS — K224 Dyskinesia of esophagus: Secondary | ICD-10-CM | POA: Diagnosis not present

## 2016-05-14 NOTE — Patient Instructions (Signed)
Please contact our office with any questions or concerns

## 2016-05-14 NOTE — Progress Notes (Signed)
Gastroenterology Consultation  Referring Provider:     Malva LimesFisher, Donald E, MD Primary Care Physician:  Mila Merryonald Fisher, MD Primary Gastroenterologist:  Dr. Servando SnareWohl     Reason for Consultation:     Dysphagia        HPI:   Diana Mccoy is a 70 y.o. y/o female referred for consultation & management of Dysphagia by Dr. Mila Merryonald Fisher, MD.  This patient comes in today after reporting that she use to choke on her food. The patient states this happened approximate 1 month ago. The patient had an upper GI series that showed tertiary contractions with presbyesophagus. The patient states that she has been staying away from steak bread and chicken and has had no further problems. She also drinks right after eating and tries not to lay down just after she eats. The patient denies any unexplained weight loss. There is no report of any food getting stuck in the esophagus.  Past Medical History:  Diagnosis Date  . Acid reflux 12/28/2014  . 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 Anxiety 12/28/2014  . BP (high blood pressure) 12/28/2014  . CAFL (chronic airflow limitation) (HCC) 12/28/2014  . COPD (chronic obstructive pulmonary disease) (HCC) 01/19/2016  . Depression 03/28/2015  . 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 Disorder of kidney 12/28/2014  . Gait instability 04/18/2015  . Osteoarthritis 03/28/2015  . 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 Schizophrenia (HCC) 03/28/2015  . Scoliosis 12/28/2014  . Spinal stenosis 12/28/2014    Past Surgical History:  Procedure Laterality Date  . HIP FRACTURE SURGERY Left     Prior to Admission medications   Medication Sig Start Date End Date Taking? Authorizing Provider  acetaminophen (TYLENOL) 325 MG tablet Take 650 mg by mouth.   Yes Historical Provider, MD  albuterol (PROVENTIL) (2.5 MG/3ML) 0.083% nebulizer solution Take 3 mLs (2.5 mg total) by nebulization every 4 (four) hours as needed for wheezing or shortness of breath. 04/18/15  Yes Lorie PhenixNancy  Maloney, MD  albuterol (VENTOLIN HFA) 108 (90 BASE) MCG/ACT inhaler Inhale 2 puffs into the lungs every 4 (four) hours as needed.  08/11/14  Yes Historical Provider, MD  alendronate (FOSAMAX) 70 MG tablet Take 70 mg by mouth once a week.    Yes Historical Provider, MD  buPROPion (WELLBUTRIN SR) 150 MG 12 hr tablet TAKE 1 TABLET BY MOUTH TWO TIMES A DAY 04/06/16  Yes Malva Limesonald E Fisher, MD  Calcium Carb-Cholecalciferol (OYSTER SHELL CALCIUM PLUS D) 500-200 MG-UNIT TABS Take 1 tablet by mouth 3 (three) times daily.    Yes Historical Provider, MD  clonazePAM (KLONOPIN) 0.5 MG tablet Take 1 tablet (0.5 mg total) by mouth 3 (three) times daily. 04/06/16  Yes Malva Limesonald E Fisher, MD  docusate sodium (COLACE) 100 MG capsule Take 100 mg by mouth 2 (two) times daily.   Yes Historical Provider, MD  esomeprazole (NEXIUM) 40 MG capsule Take 1 capsule (40 mg total) by mouth daily at 12 noon. 02/09/16  Yes Malva Limesonald E Fisher, MD  ferrous sulfate 325 (65 FE) MG tablet Take 325 mg by mouth 2 (two) times daily with a meal.    Yes Historical Provider, MD  glycopyrrolate (ROBINUL) 2 MG tablet Take 2 mg by mouth 3 (three) times daily. Reported on 10/20/2015 04/10/13  Yes Historical Provider, MD  HYDROcodone-acetaminophen (NORCO) 7.5-325 MG tablet Take 1 tablet by mouth every 6 (six) hours as needed for moderate pain. 05/04/16  Yes Malva Limesonald E Fisher, MD  loratadine (CLARITIN) 10  MG tablet Take 10 mg by mouth daily.    Yes Historical Provider, MD  Magnesium Hydroxide (MILK OF MAGNESIA CONCENTRATE) 2400 MG/10ML SUSP Take 30 mLs by mouth.    Yes Historical Provider, MD  meloxicam (MOBIC) 7.5 MG tablet Take 7.5 mg by mouth 2 (two) times daily.   Yes Historical Provider, MD  mometasone (ELOCON) 0.1 % cream Apply 1 application topically daily.   Yes Historical Provider, MD  nystatin ointment (MYCOSTATIN) NYSTATIN, 100000 UNIT/GM (External Ointment)  1 Ointment Ointment Apply to affected area twice a day for 0 days  Quantity: 60;  Refills: 0    Ordered :14-Aug-2013  Lorie Phenix MD;  Started 14-Aug-2013 Active 08/14/13  Yes Historical Provider, MD  paliperidone (INVEGA SUSTENNA) 156 MG/ML SUSP injection Inject 156 mg into the muscle every 30 (thirty) days.   Yes Historical Provider, MD  tiotropium (SPIRIVA HANDIHALER) 18 MCG inhalation capsule Place 1 capsule (18 mcg total) into inhaler and inhale daily. 10/20/15  Yes Lorie Phenix, MD  traZODone (DESYREL) 100 MG tablet Take 100 mg by mouth at bedtime.   Yes Historical Provider, MD  trimethoprim (TRIMPEX) 100 MG tablet TAKE ONE TABLET BY MOUTH EVERY DAY 06/17/15  Yes Lorie Phenix, MD  Vitamin D, Ergocalciferol, (DRISDOL) 50000 UNITS CAPS capsule Take 50,000 Units by mouth every 30 (thirty) days.  04/10/13  Yes Historical Provider, MD  zonisamide (ZONEGRAN) 100 MG capsule Take 200 mg by mouth daily.  04/10/13  Yes Historical Provider, MD    Family History  Problem Relation Age of Onset  . Gout Father      Social History  Substance Use Topics  . Smoking status: Current Every Day Smoker    Packs/day: 1.00    Years: 30.00  . Smokeless tobacco: Never Used  . Alcohol use No    Allergies as of 05/14/2016 - Review Complete 05/14/2016  Allergen Reaction Noted  . Ciprofloxacin Nausea And Vomiting 12/28/2014  . Penicillins  12/28/2014  . Sodium thiosulfate  03/28/2015  . Sulfa antibiotics  03/28/2015    Review of Systems:    All systems reviewed and negative except where noted in HPI.   Physical Exam:  BP (!) 142/58   Pulse (!) 109   Temp 98.1 F (36.7 C) (Oral)   Ht 5\' 5"  (1.651 m)   Wt 183 lb (83 kg)   BMI 30.45 kg/m  No LMP recorded. Patient is not currently having periods (Reason: Premenopausal). Psych:  Alert and cooperative. Normal mood and affect. General:   Alert,  Well-developed, well-nourished, pleasant and cooperative in NAD Head:  Normocephalic and atraumatic. Eyes:  Sclera clear, no icterus.   Conjunctiva pink. Ears:  Normal auditory acuity. Nose:  No  deformity, discharge, or lesions. Mouth:  No deformity or lesions,oropharynx pink & moist. Neck:  Supple; no masses or thyromegaly. Lungs:  Respirations even and unlabored.  Clear throughout to auscultation.   No wheezes, crackles, or rhonchi. No acute distress. Heart:  Regular rate and rhythm; no murmurs, clicks, rubs, or gallops. Abdomen:  Normal bowel sounds.  No bruits.  Soft, non-tender and non-distended without masses, hepatosplenomegaly or hernias noted.  No guarding or rebound tenderness.  Negative Carnett sign.   Rectal:  Deferred.  Msk:  Symmetrical without gross deformities.  Good, equal movement & strength bilaterally. Pulses:  Normal pulses noted. Extremities:  No clubbing or edema.  No cyanosis. Neurologic:  Alert and oriented x3;   Skin:  Intact without significant lesions or rashes.  No jaundice. Lymph Nodes:  No significant cervical adenopathy. Psych:  Alert and cooperative. Normal mood and affect.  Imaging Studies: No results found.  Assessment and Plan:   Diana Mccoy is a 70 y.o. y/o female who comes in today with a history of presbyesophagus and tertiary contractions on a barium swallow. The patient has been told that esophageal motility is causing her problems although she states she is doing much better without any further choking since she modified her diet. The patient has been told to stay on her soft foods and avoid bulky items. She has also been told to keep of glass of water or other liquids to chased down the food. She has also been told to continue and not lay down right after she eats. She has been told that endoscopically there is nothing to do for this dysmotility. The patient will follow up if her symptoms get worse.    Midge Miniumarren Eloina Ergle, MD. Clementeen GrahamFACG   Note: This dictation was prepared with Dragon dictation along with smaller phrase technology. Any transcriptional errors that result from this process are unintentional.

## 2016-05-30 DIAGNOSIS — F209 Schizophrenia, unspecified: Secondary | ICD-10-CM | POA: Diagnosis not present

## 2016-06-15 ENCOUNTER — Telehealth: Payer: Self-pay | Admitting: Family Medicine

## 2016-06-15 NOTE — Telephone Encounter (Signed)
Annice PihJackie from [facility] stated she would like a call back to get clarification on which dosage pt is supposed to be taking and how often b/c they have orders for both HYDROcodone-acetaminophen (NORCO) 7.5-325 MG tablet & HYDROcodone-acetaminophen (NORCO/VICODIN) 5-325 MG tablet. Annice PihJackie would like a call back and would like new Rx sent for the correct dosage. Please advise. Thanks TNP

## 2016-06-15 NOTE — Telephone Encounter (Signed)
Per last note increasing Hydrocodone to 7.5-325 mg every 6 hours as needed and DC 5-325 mg dose-, pharmacist Noralee Charsadvised-aa

## 2016-06-18 DIAGNOSIS — F411 Generalized anxiety disorder: Secondary | ICD-10-CM | POA: Diagnosis not present

## 2016-06-18 DIAGNOSIS — F329 Major depressive disorder, single episode, unspecified: Secondary | ICD-10-CM | POA: Diagnosis not present

## 2016-06-19 ENCOUNTER — Other Ambulatory Visit: Payer: Self-pay | Admitting: Family Medicine

## 2016-06-19 DIAGNOSIS — M1990 Unspecified osteoarthritis, unspecified site: Secondary | ICD-10-CM

## 2016-06-19 MED ORDER — HYDROCODONE-ACETAMINOPHEN 7.5-325 MG PO TABS: 1.0000 | ORAL_TABLET | Freq: Four times a day (QID) | ORAL | 0 refills | Status: DC | PRN

## 2016-06-19 NOTE — Telephone Encounter (Signed)
Pt needs refill on her  HYDROcodone-acetaminophen (NORCO) 7.5-325 MG tablet  Please let [facility] know when its ready.  Thanks Diana Mccoy

## 2016-06-19 NOTE — Telephone Encounter (Signed)
Please review. Thanks!  

## 2016-06-20 ENCOUNTER — Telehealth: Payer: Self-pay | Admitting: Family Medicine

## 2016-06-20 NOTE — Telephone Encounter (Signed)
Dr. Sherrie MustacheFisher, is this ok to fax? The hard copy is still up front. Please advise. Thanks!

## 2016-06-20 NOTE — Telephone Encounter (Signed)
[facility] called saying Diana Mccoy has a RX here for her pain medications but they can not come pick up the written RX.  They want to know if it can be faxed.  Their call back is [phone]  [phone]  Thanks Barth Kirkseri

## 2016-06-20 NOTE — Telephone Encounter (Signed)
That's fine. Thanks!

## 2016-06-21 NOTE — Telephone Encounter (Signed)
Prescription faxed to [facility]. sd

## 2016-06-27 DIAGNOSIS — F209 Schizophrenia, unspecified: Secondary | ICD-10-CM | POA: Diagnosis not present

## 2016-07-03 ENCOUNTER — Telehealth: Payer: Self-pay | Admitting: Family Medicine

## 2016-07-03 DIAGNOSIS — M4800 Spinal stenosis, site unspecified: Secondary | ICD-10-CM

## 2016-07-03 MED ORDER — HYDROCODONE-ACETAMINOPHEN 5-325 MG PO TABS: 1.0000 | ORAL_TABLET | Freq: Four times a day (QID) | ORAL | 0 refills | Status: DC | PRN

## 2016-07-03 NOTE — Telephone Encounter (Signed)
Rx printed

## 2016-07-03 NOTE — Telephone Encounter (Signed)
If she is falling I agree it would be better to decrease the dose to Hydrocodone-APAP 5-325mg  to see how she does. Ok to give verbal to change Rx and we can fax new one for dose change if needed. Just let me know.   Will route to Dr. Sherrie MustacheFisher for FYI/review for when he returns.

## 2016-07-03 NOTE — Telephone Encounter (Signed)
Please review. Thank you. sd  

## 2016-07-03 NOTE — Telephone Encounter (Signed)
Mrs. Clotilde DieterRosa advised as below, and they are requesting a new prescription to be faxed with new orders. Thank you. sd

## 2016-07-03 NOTE — Telephone Encounter (Signed)
Rosa would like a call back to discuss lowering pt's dose of HYDROcodone-acetaminophen (NORCO) 7.5-325 MG tablet back to the 5-325 mg. Rosa stated that since the medication was increased pt has fallen a couple times. Rosa stated that she thinks the medication combined with pt not eating breakfast most morning is what is causing the falling. Rosa was advised Dr. Sherrie MustacheFisher is out of the office today. Please advise. Thanks TNP

## 2016-07-20 ENCOUNTER — Other Ambulatory Visit: Payer: Self-pay | Admitting: *Deleted

## 2016-07-20 DIAGNOSIS — M4800 Spinal stenosis, site unspecified: Secondary | ICD-10-CM

## 2016-07-23 ENCOUNTER — Other Ambulatory Visit: Payer: Self-pay

## 2016-07-23 ENCOUNTER — Other Ambulatory Visit: Payer: Self-pay | Admitting: Family Medicine

## 2016-07-23 DIAGNOSIS — M4800 Spinal stenosis, site unspecified: Secondary | ICD-10-CM

## 2016-07-23 MED ORDER — HYDROCODONE-ACETAMINOPHEN 5-325 MG PO TABS: 1.0000 | ORAL_TABLET | Freq: Four times a day (QID) | ORAL | 0 refills | Status: DC | PRN

## 2016-07-23 NOTE — Telephone Encounter (Signed)
Pt contacted office for refill request on the following medications:  HYDROcodone-acetaminophen (NORCO/VICODIN) 5-325 MG tablet.  CB#[phone]/MW °

## 2016-07-23 NOTE — Telephone Encounter (Signed)
Rx was faxed to pharmacy.  

## 2016-07-24 ENCOUNTER — Telehealth: Payer: Self-pay

## 2016-07-24 NOTE — Telephone Encounter (Signed)
Please advise 

## 2016-07-24 NOTE — Telephone Encounter (Signed)
Diana Mccoy with [facility] called about recent Norco prescription that was faxed to [facility]. Pt was taking 7.5-325, and pt's prescription was for 5-325. Diana Mccoy states if this was a decrease in medication, they need orders to D/C the 7.5-325. [name], CMA

## 2016-07-25 DIAGNOSIS — F209 Schizophrenia, unspecified: Secondary | ICD-10-CM | POA: Diagnosis not present

## 2016-07-25 NOTE — Telephone Encounter (Signed)
OK, please d/c the 7.5 mg and take the 5/325 instead.

## 2016-07-26 ENCOUNTER — Encounter: Payer: Self-pay | Admitting: Family Medicine

## 2016-07-26 ENCOUNTER — Ambulatory Visit (INDEPENDENT_AMBULATORY_CARE_PROVIDER_SITE_OTHER): Payer: Medicare Other | Admitting: Family Medicine

## 2016-07-26 VITALS — BP 120/64 | HR 99 | Temp 98.1°F | Resp 16 | Ht 65.0 in

## 2016-07-26 DIAGNOSIS — F2089 Other schizophrenia: Secondary | ICD-10-CM | POA: Diagnosis not present

## 2016-07-26 DIAGNOSIS — M5410 Radiculopathy, site unspecified: Secondary | ICD-10-CM | POA: Diagnosis not present

## 2016-07-26 DIAGNOSIS — Z79899 Other long term (current) drug therapy: Secondary | ICD-10-CM | POA: Diagnosis not present

## 2016-07-26 DIAGNOSIS — M1990 Unspecified osteoarthritis, unspecified site: Secondary | ICD-10-CM | POA: Diagnosis not present

## 2016-07-26 DIAGNOSIS — J449 Chronic obstructive pulmonary disease, unspecified: Secondary | ICD-10-CM

## 2016-07-26 DIAGNOSIS — M4800 Spinal stenosis, site unspecified: Secondary | ICD-10-CM | POA: Diagnosis not present

## 2016-07-26 MED ORDER — HYDROCODONE-ACETAMINOPHEN 7.5-325 MG PO TABS: 1.0000 | ORAL_TABLET | Freq: Four times a day (QID) | ORAL | 0 refills | Status: DC | PRN

## 2016-07-26 NOTE — Progress Notes (Signed)
Patient: Diana Mccoy Female    DOB: 11/07/1945   71 y.o.   MRN: [id number] Visit Date: 07/26/2016  Today's Provider: Mila Merryonald Gideon Burstein, MD   Chief Complaint  Patient presents with  . Follow-up  . COPD  . Arthritis  . Schizophrenia   Subjective:    HPI  Arthritis/sciatica From 04/26/2016-Worsening pain. Increase hydrocodone from 5 to 7.5. Pain radiates down right leg. Last refill for hydrocodone was cut back to 5 but she thinks pain has now worsened. Her family sent note requesting referral to specialist.    Other schizophrenia (HCC) From 04/26/2016-Stable on current psychiatric medications. [facility]. Is due for EKG due to potential for QT elongation on Invega and trazadone.   Chronic obstructive pulmonary disease, unspecified COPD type (HCC) From 04/26/2016-no changes. Continue current regiment of inhalers. Not having any trouble with shortness of breath. Reports good response to inhalers.    Allergies  Allergen Reactions  . Ciprofloxacin Nausea And Vomiting  . Penicillins     rash, hives  . Sodium Thiosulfate   . Sulfa Antibiotics      Current Outpatient Prescriptions:  .  acetaminophen (TYLENOL) 325 MG tablet, Take 650 mg by mouth., Disp: , Rfl:  .  albuterol (PROVENTIL) (2.5 MG/3ML) 0.083% nebulizer solution, Take 3 mLs (2.5 mg total) by nebulization every 4 (four) hours as needed for wheezing or shortness of breath., Disp: 360 mL, Rfl: 12 .  albuterol (VENTOLIN HFA) 108 (90 BASE) MCG/ACT inhaler, Inhale 2 puffs into the lungs every 4 (four) hours as needed. , Disp: , Rfl:  .  alendronate (FOSAMAX) 70 MG tablet, Take 70 mg by mouth once a week. , Disp: , Rfl:  .  buPROPion (WELLBUTRIN SR) 150 MG 12 hr tablet, TAKE 1 TABLET BY MOUTH TWO TIMES A DAY, Disp: 60 tablet, Rfl: 11 .  Calcium Carb-Cholecalciferol (OYSTER SHELL CALCIUM PLUS D) 500-200 MG-UNIT TABS, Take 1 tablet by mouth 3 (three) times daily. , Disp: , Rfl:  .  clonazePAM  (KLONOPIN) 0.5 MG tablet, Take 1 tablet (0.5 mg total) by mouth 3 (three) times daily., Disp: 90 tablet, Rfl: 5 .  docusate sodium (COLACE) 100 MG capsule, Take 100 mg by mouth 2 (two) times daily., Disp: , Rfl:  .  esomeprazole (NEXIUM) 40 MG capsule, Take 1 capsule (40 mg total) by mouth daily at 12 noon., Disp: 90 capsule, Rfl: 4 .  ferrous sulfate 325 (65 FE) MG tablet, Take 325 mg by mouth 2 (two) times daily with a meal. , Disp: , Rfl:  .  glycopyrrolate (ROBINUL) 2 MG tablet, Take 2 mg by mouth 3 (three) times daily. Reported on 10/20/2015, Disp: , Rfl:  .  HYDROcodone-acetaminophen (NORCO/VICODIN) 5-325 MG tablet, Take 1 tablet by mouth every 6 (six) hours as needed for moderate pain., Disp: 90 tablet, Rfl: 0 .  loratadine (CLARITIN) 10 MG tablet, Take 10 mg by mouth daily. , Disp: , Rfl:  .  Magnesium Hydroxide (MILK OF MAGNESIA CONCENTRATE) 2400 MG/10ML SUSP, Take 30 mLs by mouth. , Disp: , Rfl:  .  meloxicam (MOBIC) 7.5 MG tablet, Take 7.5 mg by mouth 2 (two) times daily., Disp: , Rfl:  .  mometasone (ELOCON) 0.1 % cream, Apply 1 application topically daily., Disp: , Rfl:  .  nystatin ointment (MYCOSTATIN), NYSTATIN, 100000 UNIT/GM (External Ointment)  1 Ointment Ointment Apply to affected area twice a day for 0 days  Quantity: 60;  Refills: 0  Ordered :14-Aug-2013  Lorie Phenix MD;  Started 14-Aug-2013 Active, Disp: , Rfl:  .  paliperidone (INVEGA SUSTENNA) 156 MG/ML SUSP injection, Inject 156 mg into the muscle every 30 (thirty) days., Disp: , Rfl:  .  tiotropium (SPIRIVA HANDIHALER) 18 MCG inhalation capsule, Place 1 capsule (18 mcg total) into inhaler and inhale daily., Disp: 30 capsule, Rfl: 12 .  traZODone (DESYREL) 100 MG tablet, Take 100 mg by mouth at bedtime., Disp: , Rfl:  .  trimethoprim (TRIMPEX) 100 MG tablet, TAKE ONE TABLET BY MOUTH EVERY DAY, Disp: 90 tablet, Rfl: 1 .  Vitamin D, Ergocalciferol, (DRISDOL) 50000 UNITS CAPS capsule, Take 50,000 Units by mouth every 30  (thirty) days. , Disp: , Rfl:  .  zonisamide (ZONEGRAN) 100 MG capsule, Take 200 mg by mouth daily. , Disp: , Rfl:   Review of Systems  Constitutional: Negative for appetite change, chills, fatigue and fever.  Respiratory: Negative for chest tightness and shortness of breath.   Cardiovascular: Negative for chest pain and palpitations.  Gastrointestinal: Negative for abdominal pain, nausea and vomiting.  Neurological: Negative for dizziness and weakness.    Social History  Substance Use Topics  . Smoking status: Current Every Day Smoker    Packs/day: 1.00    Years: 30.00  . Smokeless tobacco: Never Used  . Alcohol use No   Objective:   BP 120/64 (BP Location: Right Arm, Patient Position: Sitting, Cuff Size: Normal)   Pulse 99   Temp 98.1 F (36.7 C) (Oral)   Resp 16   Ht 5\' 5"  (1.651 m)   SpO2 94%   Physical Exam   General Appearance:    Alert, cooperative, no distress  Eyes:    PERRL, conjunctiva/corneas clear, EOM's intact       Lungs:     Clear to auscultation bilaterally, respirations unlabored  Heart:    Regular rate and rhythm  Neurologic:   Awake, alert, oriented x 3. No apparent focal neurological           defect.           Assessment & Plan:     1. Spinal stenosis, unspecified spinal region  - Ambulatory referral to Orthopedic Surgery  2. Arthritis   3. Back pain with right-sided radiculopathy She did a little better with higher dose of hydrocodone/apap Will go back up to 7.5/325 while awaiting referral.  - HYDROcodone-acetaminophen (NORCO) 7.5-325 MG tablet; Take 1 tablet by mouth every 6 (six) hours as needed for moderate pain.  Dispense: 30 tablet; Refill: 0  4. Other schizophrenia (HCC) Stable on current medications managed at [facility]  5. Chronic obstructive pulmonary disease, unspecified COPD type (HCC) Stable on current medications.   6. Long-term use of high-risk medication No sign of QT prolongation. Continue current  medications.   - EKG 12-Lead  Return in about 6 months (around 01/23/2017).         Mila Merry, MD  [facility]

## 2016-07-26 NOTE — Telephone Encounter (Signed)
Advised [facility] to discontinue 7.5/325 Norco.

## 2016-08-03 DIAGNOSIS — M5416 Radiculopathy, lumbar region: Secondary | ICD-10-CM | POA: Diagnosis not present

## 2016-08-03 DIAGNOSIS — M545 Low back pain, unspecified: Secondary | ICD-10-CM | POA: Insufficient documentation

## 2016-08-10 ENCOUNTER — Other Ambulatory Visit: Payer: Self-pay | Admitting: Specialist

## 2016-08-10 DIAGNOSIS — M5416 Radiculopathy, lumbar region: Secondary | ICD-10-CM

## 2016-08-20 ENCOUNTER — Ambulatory Visit: Payer: Medicare Other

## 2016-08-20 ENCOUNTER — Other Ambulatory Visit: Payer: Self-pay | Admitting: *Deleted

## 2016-08-20 DIAGNOSIS — M5410 Radiculopathy, site unspecified: Secondary | ICD-10-CM

## 2016-08-20 MED ORDER — HYDROCODONE-ACETAMINOPHEN 7.5-325 MG PO TABS: 1.0000 | ORAL_TABLET | Freq: Four times a day (QID) | ORAL | 0 refills | Status: DC | PRN

## 2016-08-20 NOTE — Telephone Encounter (Signed)
[facility] was [facility].

## 2016-08-27 ENCOUNTER — Ambulatory Visit: Payer: Medicare Other

## 2016-08-29 DIAGNOSIS — F209 Schizophrenia, unspecified: Secondary | ICD-10-CM | POA: Diagnosis not present

## 2016-09-03 ENCOUNTER — Other Ambulatory Visit: Payer: Self-pay | Admitting: Family Medicine

## 2016-09-03 DIAGNOSIS — M5410 Radiculopathy, site unspecified: Secondary | ICD-10-CM

## 2016-09-03 MED ORDER — HYDROCODONE-ACETAMINOPHEN 7.5-325 MG PO TABS: 1.0000 | ORAL_TABLET | Freq: Four times a day (QID) | ORAL | 0 refills | Status: DC | PRN

## 2016-09-03 NOTE — Telephone Encounter (Signed)
Please review. Thanks!  

## 2016-09-03 NOTE — Telephone Encounter (Signed)
Faxed Rx to pharmacy and to [facility].

## 2016-09-03 NOTE — Telephone Encounter (Signed)
Pt needs refill of HYDROcodone-acetaminophen (NORCO) 7.5-325 MG tablet please call when ready for pick up.

## 2016-09-05 DIAGNOSIS — M1990 Unspecified osteoarthritis, unspecified site: Secondary | ICD-10-CM | POA: Diagnosis not present

## 2016-09-05 DIAGNOSIS — J449 Chronic obstructive pulmonary disease, unspecified: Secondary | ICD-10-CM | POA: Diagnosis not present

## 2016-09-05 DIAGNOSIS — L89892 Pressure ulcer of other site, stage 2: Secondary | ICD-10-CM | POA: Diagnosis not present

## 2016-09-05 DIAGNOSIS — I10 Essential (primary) hypertension: Secondary | ICD-10-CM | POA: Diagnosis not present

## 2016-09-05 DIAGNOSIS — R531 Weakness: Secondary | ICD-10-CM | POA: Diagnosis not present

## 2016-09-05 DIAGNOSIS — F419 Anxiety disorder, unspecified: Secondary | ICD-10-CM | POA: Diagnosis not present

## 2016-09-05 DIAGNOSIS — F329 Major depressive disorder, single episode, unspecified: Secondary | ICD-10-CM | POA: Diagnosis not present

## 2016-09-05 DIAGNOSIS — F209 Schizophrenia, unspecified: Secondary | ICD-10-CM | POA: Diagnosis not present

## 2016-09-05 DIAGNOSIS — Z9181 History of falling: Secondary | ICD-10-CM | POA: Diagnosis not present

## 2016-09-07 DIAGNOSIS — F209 Schizophrenia, unspecified: Secondary | ICD-10-CM | POA: Diagnosis not present

## 2016-09-07 DIAGNOSIS — J449 Chronic obstructive pulmonary disease, unspecified: Secondary | ICD-10-CM | POA: Diagnosis not present

## 2016-09-07 DIAGNOSIS — I10 Essential (primary) hypertension: Secondary | ICD-10-CM | POA: Diagnosis not present

## 2016-09-07 DIAGNOSIS — R531 Weakness: Secondary | ICD-10-CM | POA: Diagnosis not present

## 2016-09-07 DIAGNOSIS — L89892 Pressure ulcer of other site, stage 2: Secondary | ICD-10-CM | POA: Diagnosis not present

## 2016-09-07 DIAGNOSIS — M1990 Unspecified osteoarthritis, unspecified site: Secondary | ICD-10-CM | POA: Diagnosis not present

## 2016-09-10 DIAGNOSIS — J449 Chronic obstructive pulmonary disease, unspecified: Secondary | ICD-10-CM | POA: Diagnosis not present

## 2016-09-10 DIAGNOSIS — I10 Essential (primary) hypertension: Secondary | ICD-10-CM | POA: Diagnosis not present

## 2016-09-10 DIAGNOSIS — M1990 Unspecified osteoarthritis, unspecified site: Secondary | ICD-10-CM | POA: Diagnosis not present

## 2016-09-10 DIAGNOSIS — L89892 Pressure ulcer of other site, stage 2: Secondary | ICD-10-CM | POA: Diagnosis not present

## 2016-09-10 DIAGNOSIS — F209 Schizophrenia, unspecified: Secondary | ICD-10-CM | POA: Diagnosis not present

## 2016-09-10 DIAGNOSIS — R531 Weakness: Secondary | ICD-10-CM | POA: Diagnosis not present

## 2016-09-11 ENCOUNTER — Other Ambulatory Visit: Payer: Self-pay | Admitting: Family Medicine

## 2016-09-11 DIAGNOSIS — L89892 Pressure ulcer of other site, stage 2: Secondary | ICD-10-CM | POA: Diagnosis not present

## 2016-09-11 DIAGNOSIS — M1990 Unspecified osteoarthritis, unspecified site: Secondary | ICD-10-CM | POA: Diagnosis not present

## 2016-09-11 DIAGNOSIS — F209 Schizophrenia, unspecified: Secondary | ICD-10-CM | POA: Diagnosis not present

## 2016-09-11 DIAGNOSIS — I10 Essential (primary) hypertension: Secondary | ICD-10-CM | POA: Diagnosis not present

## 2016-09-11 DIAGNOSIS — J449 Chronic obstructive pulmonary disease, unspecified: Secondary | ICD-10-CM | POA: Diagnosis not present

## 2016-09-11 DIAGNOSIS — R531 Weakness: Secondary | ICD-10-CM | POA: Diagnosis not present

## 2016-09-11 DIAGNOSIS — M5410 Radiculopathy, site unspecified: Secondary | ICD-10-CM

## 2016-09-11 MED ORDER — HYDROCODONE-ACETAMINOPHEN 7.5-325 MG PO TABS: 1.0000 | ORAL_TABLET | Freq: Four times a day (QID) | ORAL | 0 refills | Status: DC | PRN

## 2016-09-11 NOTE — Telephone Encounter (Signed)
Diana Mccoy with [facility] contacted office for refill request on the following medications: HYDROcodone-acetaminophen (NORCO) 7.5-325 MG tablet Last Rx: 09/03/16 Please advise. Thanks TNP

## 2016-09-12 DIAGNOSIS — M1990 Unspecified osteoarthritis, unspecified site: Secondary | ICD-10-CM | POA: Diagnosis not present

## 2016-09-12 DIAGNOSIS — L89892 Pressure ulcer of other site, stage 2: Secondary | ICD-10-CM | POA: Diagnosis not present

## 2016-09-12 DIAGNOSIS — I10 Essential (primary) hypertension: Secondary | ICD-10-CM | POA: Diagnosis not present

## 2016-09-12 DIAGNOSIS — J449 Chronic obstructive pulmonary disease, unspecified: Secondary | ICD-10-CM | POA: Diagnosis not present

## 2016-09-12 DIAGNOSIS — R531 Weakness: Secondary | ICD-10-CM | POA: Diagnosis not present

## 2016-09-12 DIAGNOSIS — F209 Schizophrenia, unspecified: Secondary | ICD-10-CM | POA: Diagnosis not present

## 2016-09-13 DIAGNOSIS — F209 Schizophrenia, unspecified: Secondary | ICD-10-CM | POA: Diagnosis not present

## 2016-09-13 DIAGNOSIS — L89892 Pressure ulcer of other site, stage 2: Secondary | ICD-10-CM | POA: Diagnosis not present

## 2016-09-13 DIAGNOSIS — J449 Chronic obstructive pulmonary disease, unspecified: Secondary | ICD-10-CM | POA: Diagnosis not present

## 2016-09-13 DIAGNOSIS — M1990 Unspecified osteoarthritis, unspecified site: Secondary | ICD-10-CM | POA: Diagnosis not present

## 2016-09-13 DIAGNOSIS — R531 Weakness: Secondary | ICD-10-CM | POA: Diagnosis not present

## 2016-09-13 DIAGNOSIS — I10 Essential (primary) hypertension: Secondary | ICD-10-CM | POA: Diagnosis not present

## 2016-09-16 DIAGNOSIS — J449 Chronic obstructive pulmonary disease, unspecified: Secondary | ICD-10-CM | POA: Diagnosis not present

## 2016-09-16 DIAGNOSIS — L89892 Pressure ulcer of other site, stage 2: Secondary | ICD-10-CM | POA: Diagnosis not present

## 2016-09-16 DIAGNOSIS — R531 Weakness: Secondary | ICD-10-CM | POA: Diagnosis not present

## 2016-09-16 DIAGNOSIS — I10 Essential (primary) hypertension: Secondary | ICD-10-CM | POA: Diagnosis not present

## 2016-09-16 DIAGNOSIS — F209 Schizophrenia, unspecified: Secondary | ICD-10-CM | POA: Diagnosis not present

## 2016-09-16 DIAGNOSIS — M1990 Unspecified osteoarthritis, unspecified site: Secondary | ICD-10-CM | POA: Diagnosis not present

## 2016-09-17 DIAGNOSIS — L89892 Pressure ulcer of other site, stage 2: Secondary | ICD-10-CM | POA: Diagnosis not present

## 2016-09-17 DIAGNOSIS — I10 Essential (primary) hypertension: Secondary | ICD-10-CM | POA: Diagnosis not present

## 2016-09-17 DIAGNOSIS — F209 Schizophrenia, unspecified: Secondary | ICD-10-CM | POA: Diagnosis not present

## 2016-09-17 DIAGNOSIS — J449 Chronic obstructive pulmonary disease, unspecified: Secondary | ICD-10-CM | POA: Diagnosis not present

## 2016-09-17 DIAGNOSIS — M1990 Unspecified osteoarthritis, unspecified site: Secondary | ICD-10-CM | POA: Diagnosis not present

## 2016-09-17 DIAGNOSIS — R531 Weakness: Secondary | ICD-10-CM | POA: Diagnosis not present

## 2016-09-18 DIAGNOSIS — L89892 Pressure ulcer of other site, stage 2: Secondary | ICD-10-CM | POA: Diagnosis not present

## 2016-09-18 DIAGNOSIS — F209 Schizophrenia, unspecified: Secondary | ICD-10-CM | POA: Diagnosis not present

## 2016-09-18 DIAGNOSIS — I10 Essential (primary) hypertension: Secondary | ICD-10-CM | POA: Diagnosis not present

## 2016-09-18 DIAGNOSIS — J449 Chronic obstructive pulmonary disease, unspecified: Secondary | ICD-10-CM | POA: Diagnosis not present

## 2016-09-18 DIAGNOSIS — M1990 Unspecified osteoarthritis, unspecified site: Secondary | ICD-10-CM | POA: Diagnosis not present

## 2016-09-18 DIAGNOSIS — R531 Weakness: Secondary | ICD-10-CM | POA: Diagnosis not present

## 2016-09-19 DIAGNOSIS — R531 Weakness: Secondary | ICD-10-CM | POA: Diagnosis not present

## 2016-09-19 DIAGNOSIS — J449 Chronic obstructive pulmonary disease, unspecified: Secondary | ICD-10-CM | POA: Diagnosis not present

## 2016-09-19 DIAGNOSIS — I10 Essential (primary) hypertension: Secondary | ICD-10-CM | POA: Diagnosis not present

## 2016-09-19 DIAGNOSIS — F209 Schizophrenia, unspecified: Secondary | ICD-10-CM | POA: Diagnosis not present

## 2016-09-19 DIAGNOSIS — M1990 Unspecified osteoarthritis, unspecified site: Secondary | ICD-10-CM | POA: Diagnosis not present

## 2016-09-19 DIAGNOSIS — L89892 Pressure ulcer of other site, stage 2: Secondary | ICD-10-CM | POA: Diagnosis not present

## 2016-09-20 DIAGNOSIS — I10 Essential (primary) hypertension: Secondary | ICD-10-CM | POA: Diagnosis not present

## 2016-09-20 DIAGNOSIS — R531 Weakness: Secondary | ICD-10-CM | POA: Diagnosis not present

## 2016-09-20 DIAGNOSIS — F209 Schizophrenia, unspecified: Secondary | ICD-10-CM | POA: Diagnosis not present

## 2016-09-20 DIAGNOSIS — J449 Chronic obstructive pulmonary disease, unspecified: Secondary | ICD-10-CM | POA: Diagnosis not present

## 2016-09-20 DIAGNOSIS — M1990 Unspecified osteoarthritis, unspecified site: Secondary | ICD-10-CM | POA: Diagnosis not present

## 2016-09-20 DIAGNOSIS — L89892 Pressure ulcer of other site, stage 2: Secondary | ICD-10-CM | POA: Diagnosis not present

## 2016-09-23 DIAGNOSIS — L89892 Pressure ulcer of other site, stage 2: Secondary | ICD-10-CM | POA: Diagnosis not present

## 2016-09-23 DIAGNOSIS — M1990 Unspecified osteoarthritis, unspecified site: Secondary | ICD-10-CM | POA: Diagnosis not present

## 2016-09-23 DIAGNOSIS — R531 Weakness: Secondary | ICD-10-CM | POA: Diagnosis not present

## 2016-09-23 DIAGNOSIS — J449 Chronic obstructive pulmonary disease, unspecified: Secondary | ICD-10-CM | POA: Diagnosis not present

## 2016-09-23 DIAGNOSIS — F209 Schizophrenia, unspecified: Secondary | ICD-10-CM | POA: Diagnosis not present

## 2016-09-23 DIAGNOSIS — I10 Essential (primary) hypertension: Secondary | ICD-10-CM | POA: Diagnosis not present

## 2016-09-24 ENCOUNTER — Telehealth: Payer: Self-pay | Admitting: Family Medicine

## 2016-09-24 DIAGNOSIS — I10 Essential (primary) hypertension: Secondary | ICD-10-CM | POA: Diagnosis not present

## 2016-09-24 DIAGNOSIS — M1990 Unspecified osteoarthritis, unspecified site: Secondary | ICD-10-CM | POA: Diagnosis not present

## 2016-09-24 DIAGNOSIS — L89892 Pressure ulcer of other site, stage 2: Secondary | ICD-10-CM | POA: Diagnosis not present

## 2016-09-24 DIAGNOSIS — J449 Chronic obstructive pulmonary disease, unspecified: Secondary | ICD-10-CM | POA: Diagnosis not present

## 2016-09-24 DIAGNOSIS — R531 Weakness: Secondary | ICD-10-CM | POA: Diagnosis not present

## 2016-09-24 DIAGNOSIS — F209 Schizophrenia, unspecified: Secondary | ICD-10-CM | POA: Diagnosis not present

## 2016-09-24 DIAGNOSIS — M5410 Radiculopathy, site unspecified: Secondary | ICD-10-CM

## 2016-09-24 MED ORDER — HYDROCODONE-ACETAMINOPHEN 7.5-325 MG PO TABS: 1.0000 | ORAL_TABLET | Freq: Four times a day (QID) | ORAL | 0 refills | Status: DC | PRN

## 2016-09-24 NOTE — Telephone Encounter (Signed)
Please advise. Thanks.  

## 2016-09-24 NOTE — Telephone Encounter (Signed)
Pt contacted office for refill request on the following medications:  HYDROcodone-acetaminophen (NORCO) 7.5-325 MG tablet.  VH#[phone]/XLCB#[phone]/MW

## 2016-09-25 ENCOUNTER — Telehealth: Payer: Self-pay | Admitting: Family Medicine

## 2016-09-25 DIAGNOSIS — F209 Schizophrenia, unspecified: Secondary | ICD-10-CM | POA: Diagnosis not present

## 2016-09-25 DIAGNOSIS — J449 Chronic obstructive pulmonary disease, unspecified: Secondary | ICD-10-CM | POA: Diagnosis not present

## 2016-09-25 DIAGNOSIS — I10 Essential (primary) hypertension: Secondary | ICD-10-CM | POA: Diagnosis not present

## 2016-09-25 DIAGNOSIS — M1990 Unspecified osteoarthritis, unspecified site: Secondary | ICD-10-CM | POA: Diagnosis not present

## 2016-09-25 DIAGNOSIS — R531 Weakness: Secondary | ICD-10-CM | POA: Diagnosis not present

## 2016-09-25 DIAGNOSIS — L89892 Pressure ulcer of other site, stage 2: Secondary | ICD-10-CM | POA: Diagnosis not present

## 2016-09-25 NOTE — Telephone Encounter (Signed)
Called Pt to schedule AWV with [facility] - knb °

## 2016-09-26 DIAGNOSIS — F209 Schizophrenia, unspecified: Secondary | ICD-10-CM | POA: Diagnosis not present

## 2016-09-27 ENCOUNTER — Telehealth: Payer: Self-pay | Admitting: Family Medicine

## 2016-09-27 DIAGNOSIS — I10 Essential (primary) hypertension: Secondary | ICD-10-CM | POA: Diagnosis not present

## 2016-09-27 DIAGNOSIS — R531 Weakness: Secondary | ICD-10-CM | POA: Diagnosis not present

## 2016-09-27 DIAGNOSIS — L89892 Pressure ulcer of other site, stage 2: Secondary | ICD-10-CM | POA: Diagnosis not present

## 2016-09-27 DIAGNOSIS — F209 Schizophrenia, unspecified: Secondary | ICD-10-CM | POA: Diagnosis not present

## 2016-09-27 DIAGNOSIS — J449 Chronic obstructive pulmonary disease, unspecified: Secondary | ICD-10-CM | POA: Diagnosis not present

## 2016-09-27 DIAGNOSIS — M1990 Unspecified osteoarthritis, unspecified site: Secondary | ICD-10-CM | POA: Diagnosis not present

## 2016-09-27 NOTE — Telephone Encounter (Signed)
Diana Mccoy with [facility] called to request a DC order stating the Rx HYDROcodone-acetaminophen (NORCO) 5-325 MG tablet has been discharged.  Fax #[phone].  CB#[phone]/MW

## 2016-10-01 DIAGNOSIS — R531 Weakness: Secondary | ICD-10-CM | POA: Diagnosis not present

## 2016-10-01 DIAGNOSIS — J449 Chronic obstructive pulmonary disease, unspecified: Secondary | ICD-10-CM | POA: Diagnosis not present

## 2016-10-01 DIAGNOSIS — L89892 Pressure ulcer of other site, stage 2: Secondary | ICD-10-CM | POA: Diagnosis not present

## 2016-10-01 DIAGNOSIS — M1990 Unspecified osteoarthritis, unspecified site: Secondary | ICD-10-CM | POA: Diagnosis not present

## 2016-10-01 DIAGNOSIS — F209 Schizophrenia, unspecified: Secondary | ICD-10-CM | POA: Diagnosis not present

## 2016-10-01 DIAGNOSIS — I10 Essential (primary) hypertension: Secondary | ICD-10-CM | POA: Diagnosis not present

## 2016-10-02 DIAGNOSIS — I10 Essential (primary) hypertension: Secondary | ICD-10-CM | POA: Diagnosis not present

## 2016-10-02 DIAGNOSIS — R531 Weakness: Secondary | ICD-10-CM | POA: Diagnosis not present

## 2016-10-02 DIAGNOSIS — M1990 Unspecified osteoarthritis, unspecified site: Secondary | ICD-10-CM | POA: Diagnosis not present

## 2016-10-02 DIAGNOSIS — L89892 Pressure ulcer of other site, stage 2: Secondary | ICD-10-CM | POA: Diagnosis not present

## 2016-10-02 DIAGNOSIS — F209 Schizophrenia, unspecified: Secondary | ICD-10-CM | POA: Diagnosis not present

## 2016-10-02 DIAGNOSIS — J449 Chronic obstructive pulmonary disease, unspecified: Secondary | ICD-10-CM | POA: Diagnosis not present

## 2016-10-02 NOTE — Telephone Encounter (Signed)
Order faxed to number listed below.

## 2016-10-02 NOTE — Telephone Encounter (Signed)
I spoke with Diana Mccoy from [facility] who states that Dr. Sherrie Mustache increased the dose of her Hydrocodone-Acetaminophen from 5-325 to 7.5-325mg . Diana Mccoy states when he changed the dose, he never sent over a d/c order for the 5-325mg  dose, so in patients MAR it looks like she is taking both doses. Please send over a D/C order for the 5-325mg  dosage.

## 2016-10-02 NOTE — Telephone Encounter (Signed)
Send verbal order to discontinue Hydrocodone-APAP 5-325 mg. Let me know if a written order needs to be faxed.

## 2016-10-02 NOTE — Telephone Encounter (Signed)
Facility needs written order to place in patients chart. Order written out and placed on [employer]. Awaiting signature.

## 2016-10-04 ENCOUNTER — Other Ambulatory Visit: Payer: Self-pay

## 2016-10-04 DIAGNOSIS — M5410 Radiculopathy, site unspecified: Secondary | ICD-10-CM

## 2016-10-04 NOTE — Telephone Encounter (Signed)
Rosa from [facility] called  Requesting refill . She would like prescription faxed to the [facility] pharmacy. Fax number is the same as phone number [phone].

## 2016-10-04 NOTE — Telephone Encounter (Signed)
Need to see MRI of lumbar spine that Dr. Hyacinth Meeker ordered on 08-10-16.

## 2016-10-04 NOTE — Telephone Encounter (Signed)
Marcelino Duster is not here today. I checked the folder that Dr. Sherrie Mustache left with prescriptions and this patient was not in there. Also that prescription that was printed on 09/24/2016 was for a qty of 30 tablets which is a 7.5 day supply.

## 2016-10-04 NOTE — Telephone Encounter (Signed)
Check with Elon Jester to see if Dr. Sherrie Mustache printed out a prescription for her - chart looks like a prescription was printed on 09-22-16.

## 2016-10-05 ENCOUNTER — Other Ambulatory Visit: Payer: Self-pay | Admitting: Family Medicine

## 2016-10-05 DIAGNOSIS — F419 Anxiety disorder, unspecified: Secondary | ICD-10-CM

## 2016-10-05 MED ORDER — CLONAZEPAM 0.5 MG PO TABS: 0.5000 mg | ORAL_TABLET | Freq: Three times a day (TID) | ORAL | 0 refills | Status: DC

## 2016-10-05 MED ORDER — HYDROCODONE-ACETAMINOPHEN 7.5-325 MG PO TABS: 1.0000 | ORAL_TABLET | Freq: Four times a day (QID) | ORAL | 0 refills | Status: DC | PRN

## 2016-10-05 NOTE — Telephone Encounter (Signed)
Printed prescription for pick up. Further refills and follow up with Dr. Sherrie Mustache.

## 2016-10-05 NOTE — Telephone Encounter (Signed)
LMTCB ED 

## 2016-10-05 NOTE — Telephone Encounter (Signed)
[facility] faxed refill request for HYDROcodone-acetaminophen (NORCO) 7.5-325 MG tablet Please advise. Thanks TNP

## 2016-10-05 NOTE — Telephone Encounter (Signed)
Called in. Diana Mccoy, CMA  

## 2016-10-05 NOTE — Telephone Encounter (Signed)
Ok to refill? Diana Mccoy, CMA  

## 2016-10-05 NOTE — Telephone Encounter (Signed)
RX faxed.  Patient advised.

## 2016-10-05 NOTE — Telephone Encounter (Signed)
[facility] faxed refill request for clonazePAM (KLONOPIN) 0.5 MG tablet Last Rx: 04/06/16 with 5 refills Please advise. Thanks TNP

## 2016-10-08 DIAGNOSIS — M1990 Unspecified osteoarthritis, unspecified site: Secondary | ICD-10-CM | POA: Diagnosis not present

## 2016-10-08 DIAGNOSIS — L89892 Pressure ulcer of other site, stage 2: Secondary | ICD-10-CM | POA: Diagnosis not present

## 2016-10-08 DIAGNOSIS — I10 Essential (primary) hypertension: Secondary | ICD-10-CM | POA: Diagnosis not present

## 2016-10-08 DIAGNOSIS — F209 Schizophrenia, unspecified: Secondary | ICD-10-CM | POA: Diagnosis not present

## 2016-10-08 DIAGNOSIS — J449 Chronic obstructive pulmonary disease, unspecified: Secondary | ICD-10-CM | POA: Diagnosis not present

## 2016-10-08 DIAGNOSIS — R531 Weakness: Secondary | ICD-10-CM | POA: Diagnosis not present

## 2016-10-10 DIAGNOSIS — J449 Chronic obstructive pulmonary disease, unspecified: Secondary | ICD-10-CM | POA: Diagnosis not present

## 2016-10-10 DIAGNOSIS — M1990 Unspecified osteoarthritis, unspecified site: Secondary | ICD-10-CM | POA: Diagnosis not present

## 2016-10-10 DIAGNOSIS — L89892 Pressure ulcer of other site, stage 2: Secondary | ICD-10-CM | POA: Diagnosis not present

## 2016-10-10 DIAGNOSIS — I10 Essential (primary) hypertension: Secondary | ICD-10-CM | POA: Diagnosis not present

## 2016-10-10 DIAGNOSIS — R531 Weakness: Secondary | ICD-10-CM | POA: Diagnosis not present

## 2016-10-10 DIAGNOSIS — F209 Schizophrenia, unspecified: Secondary | ICD-10-CM | POA: Diagnosis not present

## 2016-10-14 DIAGNOSIS — F209 Schizophrenia, unspecified: Secondary | ICD-10-CM | POA: Diagnosis not present

## 2016-10-14 DIAGNOSIS — R531 Weakness: Secondary | ICD-10-CM | POA: Diagnosis not present

## 2016-10-14 DIAGNOSIS — L89892 Pressure ulcer of other site, stage 2: Secondary | ICD-10-CM | POA: Diagnosis not present

## 2016-10-14 DIAGNOSIS — M1990 Unspecified osteoarthritis, unspecified site: Secondary | ICD-10-CM | POA: Diagnosis not present

## 2016-10-14 DIAGNOSIS — I10 Essential (primary) hypertension: Secondary | ICD-10-CM | POA: Diagnosis not present

## 2016-10-14 DIAGNOSIS — J449 Chronic obstructive pulmonary disease, unspecified: Secondary | ICD-10-CM | POA: Diagnosis not present

## 2016-10-15 ENCOUNTER — Other Ambulatory Visit: Payer: Self-pay | Admitting: Family Medicine

## 2016-10-15 DIAGNOSIS — M5410 Radiculopathy, site unspecified: Secondary | ICD-10-CM

## 2016-10-15 MED ORDER — HYDROCODONE-ACETAMINOPHEN 7.5-325 MG PO TABS: 1.0000 | ORAL_TABLET | Freq: Four times a day (QID) | ORAL | 0 refills | Status: DC | PRN

## 2016-10-15 NOTE — Telephone Encounter (Signed)
Diana Mccoy with [facility] contacted office for refill request on the following medications:  HYDROcodone-acetaminophen (NORCO) 7.5-325 MG tablet.  UJ#[phone]/FA

## 2016-10-18 DIAGNOSIS — J449 Chronic obstructive pulmonary disease, unspecified: Secondary | ICD-10-CM | POA: Diagnosis not present

## 2016-10-18 DIAGNOSIS — L89892 Pressure ulcer of other site, stage 2: Secondary | ICD-10-CM | POA: Diagnosis not present

## 2016-10-18 DIAGNOSIS — R531 Weakness: Secondary | ICD-10-CM | POA: Diagnosis not present

## 2016-10-18 DIAGNOSIS — M1990 Unspecified osteoarthritis, unspecified site: Secondary | ICD-10-CM | POA: Diagnosis not present

## 2016-10-18 DIAGNOSIS — I10 Essential (primary) hypertension: Secondary | ICD-10-CM | POA: Diagnosis not present

## 2016-10-18 DIAGNOSIS — F209 Schizophrenia, unspecified: Secondary | ICD-10-CM | POA: Diagnosis not present

## 2016-10-22 DIAGNOSIS — L89892 Pressure ulcer of other site, stage 2: Secondary | ICD-10-CM | POA: Diagnosis not present

## 2016-10-22 DIAGNOSIS — I10 Essential (primary) hypertension: Secondary | ICD-10-CM | POA: Diagnosis not present

## 2016-10-22 DIAGNOSIS — F209 Schizophrenia, unspecified: Secondary | ICD-10-CM | POA: Diagnosis not present

## 2016-10-22 DIAGNOSIS — J449 Chronic obstructive pulmonary disease, unspecified: Secondary | ICD-10-CM | POA: Diagnosis not present

## 2016-10-22 DIAGNOSIS — M1990 Unspecified osteoarthritis, unspecified site: Secondary | ICD-10-CM | POA: Diagnosis not present

## 2016-10-22 DIAGNOSIS — R531 Weakness: Secondary | ICD-10-CM | POA: Diagnosis not present

## 2016-10-23 ENCOUNTER — Ambulatory Visit (INDEPENDENT_AMBULATORY_CARE_PROVIDER_SITE_OTHER): Payer: Medicare Other

## 2016-10-23 VITALS — BP 128/76 | HR 92 | Temp 97.7°F | Ht 65.0 in

## 2016-10-23 DIAGNOSIS — Z0000 Encounter for general adult medical examination without abnormal findings: Secondary | ICD-10-CM

## 2016-10-23 NOTE — Progress Notes (Signed)
Subjective:   Diana Mccoy is a 71 y.o. female who presents for an Initial Medicare Annual Wellness Visit.  Review of Systems    N/A  Cardiac Risk Factors include: advanced age (>8men, >52 women);obesity (BMI >30kg/m2);hypertension;smoking/ tobacco exposure     Objective:    Today's Vitals   10/23/16 1451 10/23/16 1458  BP: 128/76   Pulse: 92   Temp: 97.7 F (36.5 C)   TempSrc: Oral   Height:  (1.651 m)   PainSc: 8  8   PainLoc: Leg    There is no height or weight on file to calculate BMI.   Current Medications (verified) Outpatient Encounter Prescriptions as of 10/23/2016  Medication Sig  . acetaminophen (TYLENOL) 325 MG tablet Take 650 mg by mouth.  Marland Kitchen albuterol (PROVENTIL) (2.5 MG/3ML) 0.083% nebulizer solution Take 3 mLs (2.5 mg total) by nebulization every 4 (four) hours as needed for wheezing or shortness of breath.  Marland Kitchen albuterol (VENTOLIN HFA) 108 (90 BASE) MCG/ACT inhaler Inhale 2 puffs into the lungs every 4 (four) hours as needed.   Marland Kitchen alendronate (FOSAMAX) 70 MG tablet Take 70 mg by mouth once a week.   Marland Kitchen buPROPion (WELLBUTRIN SR) 150 MG 12 hr tablet TAKE 1 TABLET BY MOUTH TWO TIMES A DAY  . Calcium Carb-Cholecalciferol (OYSTER SHELL CALCIUM PLUS D) 500-200 MG-UNIT TABS Take 1 tablet by mouth 3 (three) times daily.   . clonazePAM (KLONOPIN) 0.5 MG tablet Take 1 tablet (0.5 mg total) by mouth 3 (three) times daily.  Marland Kitchen docusate sodium (COLACE) 100 MG capsule Take 100 mg by mouth 2 (two) times daily.  Marland Kitchen esomeprazole (NEXIUM) 40 MG capsule Take 1 capsule (40 mg total) by mouth daily at 12 noon.  . ferrous sulfate 325 (65 FE) MG tablet Take 325 mg by mouth 2 (two) times daily with a meal.   . glycopyrrolate (ROBINUL) 2 MG tablet Take 2 mg by mouth 3 (three) times daily. Reported on 10/20/2015  . HYDROcodone-acetaminophen (NORCO) 7.5-325 MG tablet Take 1 tablet by mouth every 6 (six) hours as needed for moderate pain.  Marland Kitchen loratadine (CLARITIN) 10 MG tablet Take 10 mg  by mouth daily.   . Magnesium Hydroxide (MILK OF MAGNESIA CONCENTRATE) 2400 MG/10ML SUSP Take 30 mLs by mouth.   . meloxicam (MOBIC) 7.5 MG tablet Take 7.5 mg by mouth 2 (two) times daily.  . mometasone (ELOCON) 0.1 % cream Apply 1 application topically daily.  Marland Kitchen nystatin ointment (MYCOSTATIN) NYSTATIN, 100000 UNIT/GM (External Ointment)  1 Ointment Ointment Apply to affected area twice a day for 0 days  Quantity: 60;  Refills: 0   Ordered :14-Aug-2013  Lorie Phenix MD;  Started 14-Aug-2013 Active  . paliperidone (INVEGA SUSTENNA) 156 MG/ML SUSP injection Inject 156 mg into the muscle every 30 (thirty) days.  Marland Kitchen tiotropium (SPIRIVA HANDIHALER) 18 MCG inhalation capsule Place 1 capsule (18 mcg total) into inhaler and inhale daily.  . traZODone (DESYREL) 100 MG tablet Take 100 mg by mouth at bedtime.  Marland Kitchen trimethoprim (TRIMPEX) 100 MG tablet TAKE ONE TABLET BY MOUTH EVERY DAY  . Vitamin D, Ergocalciferol, (DRISDOL) 50000 UNITS CAPS capsule Take 50,000 Units by mouth every 30 (thirty) days.   Marland Kitchen zonisamide (ZONEGRAN) 100 MG capsule Take 200 mg by mouth daily.    No facility-administered encounter medications on file as of 10/23/2016.     Allergies (verified) Ciprofloxacin; Penicillins; Sodium thiosulfate; and Sulfa antibiotics   History: Past Medical History:  Diagnosis Date  . Acid reflux 12/28/2014  .  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 Anxiety 12/28/2014  . BP (high blood pressure) 12/28/2014  . CAFL (chronic airflow limitation) (HCC) 12/28/2014  . COPD (chronic obstructive pulmonary disease) (HCC) 01/19/2016  . Depression 03/28/2015  . 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 Disorder of kidney 12/28/2014  . Gait instability 04/18/2015  . Osteoarthritis 03/28/2015  . 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 Schizophrenia (HCC) 03/28/2015  . Scoliosis 12/28/2014  . Spinal stenosis 12/28/2014   Past Surgical History:  Procedure Laterality Date  . HIP FRACTURE SURGERY Left      Family History  Problem Relation Age of Onset  . Gout Father    Social History   Occupational History  . Not on file.   Social History Main Topics  . Smoking status: Current Every Day Smoker    Packs/day: 1.00    Years: 30.00    Types: Cigarettes  . Smokeless tobacco: Never Used  . Alcohol use No  . Drug use: No  . Sexual activity: Not on file    Tobacco Counseling Ready to quit: Not Answered Counseling given: Not Answered   Activities of Daily Living In your present state of health, do you have any difficulty performing the following activities: 10/23/2016  Hearing? N  Vision? N  Difficulty concentrating or making decisions? Y  Walking or climbing stairs? Y  Dressing or bathing? N  Doing errands, shopping? Y  Preparing Food and eating ? Y  Using the Toilet? N  In the past six months, have you accidently leaked urine? N  Do you have problems with loss of bowel control? N  Managing your Medications? Y  Managing your Finances? Y  Housekeeping or managing your Housekeeping? Y  Some recent data might be hidden    Immunizations and Health Maintenance Immunization History  Administered Date(s) Administered  . Influenza,inj,Quad PF,36+ Mos 07/22/2015  . Pneumococcal Conjugate-13 07/22/2015  . Tdap 04/18/2011   There are no preventive care reminders to display for this patient.  Patient Care Team: Malva Limes, MD as PCP - General (Family Medicine) Lamar Blinks, MD as Consulting Physician (Cardiology) [facility]  Indicate any recent Medical Services you may have received from other than [facility] providers in the past year (date may be approximate).     Assessment:   This is a routine wellness examination for [name].   Hearing/Vision screen Vision Screening Comments: Pt does not follow up for vision checks. No current complaints.  Dietary issues and exercise activities discussed: Current Exercise Habits: The patient does not participate in  regular exercise at present, Exercise limited by: orthopedic condition(s)  Goals    . Increase water intake          Recommend increasing water intake from none to 2 glasses a day.      Depression Screen PHQ 2/9 Scores 10/23/2016  PHQ - 2 Score 1    Fall Risk Fall Risk  10/23/2016  Falls in the past year? Yes  Number falls in past yr: 2 or more  Risk Factor Category  High Fall Risk  Risk for fall due to : Impaired mobility  Follow up Falls prevention discussed    Cognitive Function:     6CIT Screen 10/23/2016  What Year? 0 points  What month? 0 points  What time? 0 points  Count back from 20 0 points  Months in reverse 0 points  Repeat phrase 4 points  Total Score 4    Screening Tests Health  Maintenance  Topic Date Due  . Hepatitis C Screening  01/23/2017 (Originally 1946/06/09)  . COLONOSCOPY  09/30/2017 (Originally 03/31/2012)  . PNA vac Low Risk Adult (2 of 2 - PPSV23) 09/30/2017 (Originally 07/21/2016)  . MAMMOGRAM  10/09/2017 (Originally 12/03/1993)  . INFLUENZA VACCINE  01/30/2017  . TETANUS/TDAP  04/17/2021  . DEXA SCAN  Completed      Plan:  I have personally reviewed and addressed the Medicare Annual Wellness questionnaire and have noted the following in the patient's chart:  A. Medical and social history B. Use of alcohol, tobacco or illicit drugs  C. Current medications and supplements D. Functional ability and status E.  Nutritional status F.  Physical activity G. Advance directives H. List of other physicians I.  Hospitalizations, surgeries, and ER visits in previous 12 months J.  Vitals K. Screenings such as hearing and vision if needed, cognitive and depression L. Referrals and appointments - none  In addition, I have reviewed and discussed with patient certain preventive protocols, quality metrics, and best practice recommendations. A written personalized care plan for preventive services as well as general preventive health recommendations were  provided to patient.  See attached scanned questionnaire for additional information.   Signed,  Hyacinth Meeker, LPN Nurse Health Advisor   MD Recommendations: Pt declined mammogram, colonoscopy referral and Pneumovax 23 vaccine today. Pt to have Hepatitis C screening done at next visit with PCP.  I have reviewed the health advisor's note, was available for consultation, and agree with documentation and plan  Mila Merry, MD

## 2016-10-23 NOTE — Patient Instructions (Signed)
Diana Mccoy , Thank you for taking time to come for your Medicare Wellness Visit. I appreciate your ongoing commitment to your health goals. Please review the following plan we discussed and let me know if I can assist you in the future.   Screening recommendations/referrals: Colonoscopy: completed 03/31/02, currently due Mammogram: declined Bone Density: completed 10/09/06 Recommended yearly ophthalmology/optometry visit for glaucoma screening and checkup Recommended yearly dental visit for hygiene and checkup  Vaccinations: Influenza vaccine: up to date, due 03/2017 Pneumococcal vaccine: Prevnar 13 given 07/22/15, Pneumovax 23 denied today Tdap vaccine: completed 04/18/11, due 04/2021 Shingles vaccine: declined   Advanced directives: Advance directive discussed with you today. Declined form. Please call office for packet, if interested in completing in the future.  Conditions/risks identified: Fall risk and increase in water intake. Recommend drinking at least 2 glasses a day.  Next appointment: 01/24/17 @ 10 AM   Preventive Care 65 Years and Older, Female Preventive care refers to lifestyle choices and visits with your health care provider that can promote health and wellness. What does preventive care include?  A yearly physical exam. This is also called an annual well check.  Dental exams once or twice a year.  Routine eye exams. Ask your health care provider how often you should have your eyes checked.  Personal lifestyle choices, including:  Daily care of your teeth and gums.  Regular physical activity.  Eating a healthy diet.  Avoiding tobacco and drug use.  Limiting alcohol use.  Practicing safe sex.  Taking low-dose aspirin every day.  Taking vitamin and mineral supplements as recommended by your health care provider. What happens during an annual well check? The services and screenings done by your health care provider during your annual well check will depend  on your age, overall health, lifestyle risk factors, and family history of disease. Counseling  Your health care provider may ask you questions about your:  Alcohol use.  Tobacco use.  Drug use.  Emotional well-being.  Home and relationship well-being.  Sexual activity.  Eating habits.  History of falls.  Memory and ability to understand (cognition).  Work and work Astronomer.  Reproductive health. Screening  You may have the following tests or measurements:  Height, weight, and BMI.  Blood pressure.  Lipid and cholesterol levels. These may be checked every 5 years, or more frequently if you are over 10 years old.  Skin check.  Lung cancer screening. You may have this screening every year starting at age 37 if you have a 30-pack-year history of smoking and currently smoke or have quit within the past 15 years.  Fecal occult blood test (FOBT) of the stool. You may have this test every year starting at age 75.  Flexible sigmoidoscopy or colonoscopy. You may have a sigmoidoscopy every 5 years or a colonoscopy every 10 years starting at age 8.  Hepatitis C blood test.  Hepatitis B blood test.  Sexually transmitted disease (STD) testing.  Diabetes screening. This is done by checking your blood sugar (glucose) after you have not eaten for a while (fasting). You may have this done every 1-3 years.  Bone density scan. This is done to screen for osteoporosis. You may have this done starting at age 30.  Mammogram. This may be done every 1-2 years. Talk to your health care provider about how often you should have regular mammograms. Talk with your health care provider about your test results, treatment options, and if necessary, the need for more tests. Vaccines  Your health care provider may recommend certain vaccines, such as:  Influenza vaccine. This is recommended every year.  Tetanus, diphtheria, and acellular pertussis (Tdap, Td) vaccine. You may need a Td  booster every 10 years.  Zoster vaccine. You may need this after age 61.  Pneumococcal 13-valent conjugate (PCV13) vaccine. One dose is recommended after age 73.  Pneumococcal polysaccharide (PPSV23) vaccine. One dose is recommended after age 71. Talk to your health care provider about which screenings and vaccines you need and how often you need them. This information is not intended to replace advice given to you by your health care provider. Make sure you discuss any questions you have with your health care provider. Document Released: 07/15/2015 Document Revised: 03/07/2016 Document Reviewed: 04/19/2015 Elsevier Interactive Patient Education  2017 [place] Prevention in the Home Falls can cause injuries. They can happen to people of all ages. There are many things you can do to make your home safe and to help prevent falls. What can I do on the outside of my home?  Regularly fix the edges of walkways and driveways and fix any cracks.  Remove anything that might make you trip as you walk through a door, such as a raised step or threshold.  Trim any bushes or trees on the path to your home.  Use bright outdoor lighting.  Clear any walking paths of anything that might make someone trip, such as rocks or tools.  Regularly check to see if handrails are loose or broken. Make sure that both sides of any steps have handrails.  Any raised decks and porches should have guardrails on the edges.  Have any leaves, snow, or ice cleared regularly.  Use sand or salt on walking paths during winter.  Clean up any spills in your garage right away. This includes oil or grease spills. What can I do in the bathroom?  Use night lights.  Install grab bars by the toilet and in the tub and shower. Do not use towel bars as grab bars.  Use non-skid mats or decals in the tub or shower.  If you need to sit down in the shower, use a plastic, non-slip stool.  Keep the floor dry. Clean up  any water that spills on the floor as soon as it happens.  Remove soap buildup in the tub or shower regularly.  Attach bath mats securely with double-sided non-slip rug tape.  Do not have throw rugs and other things on the floor that can make you trip. What can I do in the bedroom?  Use night lights.  Make sure that you have a light by your bed that is easy to reach.  Do not use any sheets or blankets that are too big for your bed. They should not hang down onto the floor.  Have a firm chair that has side arms. You can use this for support while you get dressed.  Do not have throw rugs and other things on the floor that can make you trip. What can I do in the kitchen?  Clean up any spills right away.  Avoid walking on wet floors.  Keep items that you use a lot in easy-to-reach places.  If you need to reach something above you, use a strong step stool that has a grab bar.  Keep electrical cords out of the way.  Do not use floor polish or wax that makes floors slippery. If you must use wax, use non-skid floor wax.  Do  not have throw rugs and other things on the floor that can make you trip. What can I do with my stairs?  Do not leave any items on the stairs.  Make sure that there are handrails on both sides of the stairs and use them. Fix handrails that are broken or loose. Make sure that handrails are as long as the stairways.  Check any carpeting to make sure that it is firmly attached to the stairs. Fix any carpet that is loose or worn.  Avoid having throw rugs at the top or bottom of the stairs. If you do have throw rugs, attach them to the floor with carpet tape.  Make sure that you have a light switch at the top of the stairs and the bottom of the stairs. If you do not have them, ask someone to add them for you. What else can I do to help prevent falls?  Wear shoes that:  Do not have high heels.  Have rubber bottoms.  Are comfortable and fit you well.  Are  closed at the toe. Do not wear sandals.  If you use a stepladder:  Make sure that it is fully opened. Do not climb a closed stepladder.  Make sure that both sides of the stepladder are locked into place.  Ask someone to hold it for you, if possible.  Clearly mark and make sure that you can see:  Any grab bars or handrails.  First and last steps.  Where the edge of each step is.  Use tools that help you move around (mobility aids) if they are needed. These include:  Canes.  Walkers.  Scooters.  Crutches.  Turn on the lights when you go into a dark area. Replace any light bulbs as soon as they burn out.  Set up your furniture so you have a clear path. Avoid moving your furniture around.  If any of your floors are uneven, fix them.  If there are any pets around you, be aware of where they are.  Review your medicines with your doctor. Some medicines can make you feel dizzy. This can increase your chance of falling. Ask your doctor what other things that you can do to help prevent falls. This information is not intended to replace advice given to you by your health care provider. Make sure you discuss any questions you have with your health care provider. Document Released: 04/14/2009 Document Revised: 11/24/2015 Document Reviewed: 07/23/2014 Elsevier Interactive Patient Education  2017 [employer].

## 2016-10-24 DIAGNOSIS — F209 Schizophrenia, unspecified: Secondary | ICD-10-CM | POA: Diagnosis not present

## 2016-10-25 ENCOUNTER — Other Ambulatory Visit: Payer: Self-pay | Admitting: Family Medicine

## 2016-10-25 DIAGNOSIS — R531 Weakness: Secondary | ICD-10-CM | POA: Diagnosis not present

## 2016-10-25 DIAGNOSIS — L89892 Pressure ulcer of other site, stage 2: Secondary | ICD-10-CM | POA: Diagnosis not present

## 2016-10-25 DIAGNOSIS — M1990 Unspecified osteoarthritis, unspecified site: Secondary | ICD-10-CM | POA: Diagnosis not present

## 2016-10-25 DIAGNOSIS — F209 Schizophrenia, unspecified: Secondary | ICD-10-CM | POA: Diagnosis not present

## 2016-10-25 DIAGNOSIS — J449 Chronic obstructive pulmonary disease, unspecified: Secondary | ICD-10-CM | POA: Diagnosis not present

## 2016-10-25 DIAGNOSIS — I10 Essential (primary) hypertension: Secondary | ICD-10-CM | POA: Diagnosis not present

## 2016-10-25 DIAGNOSIS — M5410 Radiculopathy, site unspecified: Secondary | ICD-10-CM

## 2016-10-25 NOTE — Telephone Encounter (Signed)
Pt need refill on her   HYDROcodone-acetaminophen (NORCO) 7.5-325 MG tablet  They said that we fax it to them at [phone]  Thanks teri

## 2016-10-26 ENCOUNTER — Other Ambulatory Visit: Payer: Self-pay | Admitting: Family Medicine

## 2016-10-26 DIAGNOSIS — M5410 Radiculopathy, site unspecified: Secondary | ICD-10-CM

## 2016-10-26 NOTE — Telephone Encounter (Signed)
[facility] faxed a request for the following medication. Thanks CC  HYDROcodone-acetaminophen (NORCO) 7.5-325 MG tablet

## 2016-10-29 DIAGNOSIS — R531 Weakness: Secondary | ICD-10-CM | POA: Diagnosis not present

## 2016-10-29 DIAGNOSIS — J449 Chronic obstructive pulmonary disease, unspecified: Secondary | ICD-10-CM | POA: Diagnosis not present

## 2016-10-29 DIAGNOSIS — I10 Essential (primary) hypertension: Secondary | ICD-10-CM | POA: Diagnosis not present

## 2016-10-29 DIAGNOSIS — L89892 Pressure ulcer of other site, stage 2: Secondary | ICD-10-CM | POA: Diagnosis not present

## 2016-10-29 DIAGNOSIS — F209 Schizophrenia, unspecified: Secondary | ICD-10-CM | POA: Diagnosis not present

## 2016-10-29 DIAGNOSIS — M1990 Unspecified osteoarthritis, unspecified site: Secondary | ICD-10-CM | POA: Diagnosis not present

## 2016-10-29 MED ORDER — HYDROCODONE-ACETAMINOPHEN 7.5-325 MG PO TABS: 1.0000 | ORAL_TABLET | Freq: Four times a day (QID) | ORAL | 0 refills | Status: DC | PRN

## 2016-10-31 ENCOUNTER — Other Ambulatory Visit: Payer: Self-pay | Admitting: Physician Assistant

## 2016-10-31 DIAGNOSIS — F419 Anxiety disorder, unspecified: Secondary | ICD-10-CM

## 2016-10-31 DIAGNOSIS — M1990 Unspecified osteoarthritis, unspecified site: Secondary | ICD-10-CM | POA: Diagnosis not present

## 2016-10-31 DIAGNOSIS — I10 Essential (primary) hypertension: Secondary | ICD-10-CM | POA: Diagnosis not present

## 2016-10-31 DIAGNOSIS — L89892 Pressure ulcer of other site, stage 2: Secondary | ICD-10-CM | POA: Diagnosis not present

## 2016-10-31 DIAGNOSIS — F209 Schizophrenia, unspecified: Secondary | ICD-10-CM | POA: Diagnosis not present

## 2016-10-31 DIAGNOSIS — J449 Chronic obstructive pulmonary disease, unspecified: Secondary | ICD-10-CM | POA: Diagnosis not present

## 2016-10-31 DIAGNOSIS — R531 Weakness: Secondary | ICD-10-CM | POA: Diagnosis not present

## 2016-10-31 NOTE — Telephone Encounter (Signed)
[facility] faxed a request for the following medication. Thanks CC  clonazePAM (KLONOPIN) 0.5 MG tablet  Take 1 tablet by mouth 3 times a day.

## 2016-10-31 NOTE — Telephone Encounter (Signed)
Please review-aa 

## 2016-11-01 ENCOUNTER — Other Ambulatory Visit: Payer: Self-pay | Admitting: Family Medicine

## 2016-11-01 DIAGNOSIS — F419 Anxiety disorder, unspecified: Secondary | ICD-10-CM

## 2016-11-01 NOTE — Telephone Encounter (Signed)
Diana Mccoy with [facility] contacted office for refill request on the following medications:  clonazePAM (KLONOPIN) 0.5 MG tablet.  [facility]  [id number]#[phone]/WJCB#[phone]/MW

## 2016-11-02 MED ORDER — CLONAZEPAM 0.5 MG PO TABS: 0.5000 mg | ORAL_TABLET | Freq: Three times a day (TID) | ORAL | 3 refills | Status: DC

## 2016-11-02 NOTE — Telephone Encounter (Signed)
RX called in, [facility] advised also-aa

## 2016-11-02 NOTE — Telephone Encounter (Signed)
Please call in clonazepam  

## 2016-11-04 DIAGNOSIS — F419 Anxiety disorder, unspecified: Secondary | ICD-10-CM | POA: Diagnosis not present

## 2016-11-04 DIAGNOSIS — J449 Chronic obstructive pulmonary disease, unspecified: Secondary | ICD-10-CM | POA: Diagnosis not present

## 2016-11-04 DIAGNOSIS — Z9181 History of falling: Secondary | ICD-10-CM | POA: Diagnosis not present

## 2016-11-04 DIAGNOSIS — F329 Major depressive disorder, single episode, unspecified: Secondary | ICD-10-CM | POA: Diagnosis not present

## 2016-11-04 DIAGNOSIS — I10 Essential (primary) hypertension: Secondary | ICD-10-CM | POA: Diagnosis not present

## 2016-11-04 DIAGNOSIS — F209 Schizophrenia, unspecified: Secondary | ICD-10-CM | POA: Diagnosis not present

## 2016-11-04 DIAGNOSIS — R269 Unspecified abnormalities of gait and mobility: Secondary | ICD-10-CM | POA: Diagnosis not present

## 2016-11-04 DIAGNOSIS — R531 Weakness: Secondary | ICD-10-CM | POA: Diagnosis not present

## 2016-11-04 DIAGNOSIS — M1991 Primary osteoarthritis, unspecified site: Secondary | ICD-10-CM | POA: Diagnosis not present

## 2016-11-05 DIAGNOSIS — F209 Schizophrenia, unspecified: Secondary | ICD-10-CM | POA: Diagnosis not present

## 2016-11-05 DIAGNOSIS — J449 Chronic obstructive pulmonary disease, unspecified: Secondary | ICD-10-CM | POA: Diagnosis not present

## 2016-11-05 DIAGNOSIS — M1991 Primary osteoarthritis, unspecified site: Secondary | ICD-10-CM | POA: Diagnosis not present

## 2016-11-05 DIAGNOSIS — I10 Essential (primary) hypertension: Secondary | ICD-10-CM | POA: Diagnosis not present

## 2016-11-05 DIAGNOSIS — R531 Weakness: Secondary | ICD-10-CM | POA: Diagnosis not present

## 2016-11-05 DIAGNOSIS — R269 Unspecified abnormalities of gait and mobility: Secondary | ICD-10-CM | POA: Diagnosis not present

## 2016-11-07 DIAGNOSIS — F209 Schizophrenia, unspecified: Secondary | ICD-10-CM | POA: Diagnosis not present

## 2016-11-07 DIAGNOSIS — J449 Chronic obstructive pulmonary disease, unspecified: Secondary | ICD-10-CM | POA: Diagnosis not present

## 2016-11-07 DIAGNOSIS — R531 Weakness: Secondary | ICD-10-CM | POA: Diagnosis not present

## 2016-11-07 DIAGNOSIS — R269 Unspecified abnormalities of gait and mobility: Secondary | ICD-10-CM | POA: Diagnosis not present

## 2016-11-07 DIAGNOSIS — M1991 Primary osteoarthritis, unspecified site: Secondary | ICD-10-CM | POA: Diagnosis not present

## 2016-11-07 DIAGNOSIS — I10 Essential (primary) hypertension: Secondary | ICD-10-CM | POA: Diagnosis not present

## 2016-11-08 ENCOUNTER — Other Ambulatory Visit: Payer: Self-pay | Admitting: Family Medicine

## 2016-11-08 DIAGNOSIS — J449 Chronic obstructive pulmonary disease, unspecified: Secondary | ICD-10-CM

## 2016-11-08 DIAGNOSIS — M5410 Radiculopathy, site unspecified: Secondary | ICD-10-CM

## 2016-11-08 MED ORDER — TIOTROPIUM BROMIDE MONOHYDRATE 18 MCG IN CAPS: 18.0000 ug | ORAL_CAPSULE | Freq: Every day | RESPIRATORY_TRACT | 12 refills | Status: DC

## 2016-11-08 MED ORDER — HYDROCODONE-ACETAMINOPHEN 7.5-325 MG PO TABS: 1.0000 | ORAL_TABLET | Freq: Four times a day (QID) | ORAL | 0 refills | Status: DC | PRN

## 2016-11-08 NOTE — Telephone Encounter (Signed)
[facility] refill on pt's  HYDROcodone-acetaminophen (NORCO) 7.5-325 MG tablet  Thanks teri

## 2016-11-08 NOTE — Telephone Encounter (Signed)
Pt contacted office for refill request on the following medications:  tiotropium (SPIRIVA HANDIHALER) 18 MCG inhalation capsule.  [facility].  RU#[phone]/JYCB#[phone]/MW

## 2016-11-09 DIAGNOSIS — L600 Ingrowing nail: Secondary | ICD-10-CM | POA: Diagnosis not present

## 2016-11-11 ENCOUNTER — Emergency Department
Admission: EM | Admit: 2016-11-11 | Discharge: 2016-11-11 | Disposition: A | Discharge status: Home / Self Care | Payer: Medicare Other | Attending: Emergency Medicine | Admitting: Emergency Medicine

## 2016-11-11 ENCOUNTER — Emergency Department: Payer: Medicare Other

## 2016-11-11 ENCOUNTER — Encounter: Payer: Self-pay | Admitting: Emergency Medicine

## 2016-11-11 DIAGNOSIS — J441 Chronic obstructive pulmonary disease with (acute) exacerbation: Secondary | ICD-10-CM | POA: Diagnosis not present

## 2016-11-11 DIAGNOSIS — F17210 Nicotine dependence, cigarettes, uncomplicated: Secondary | ICD-10-CM | POA: Insufficient documentation

## 2016-11-11 DIAGNOSIS — L600 Ingrowing nail: Secondary | ICD-10-CM | POA: Insufficient documentation

## 2016-11-11 DIAGNOSIS — Z79899 Other long term (current) drug therapy: Secondary | ICD-10-CM | POA: Diagnosis not present

## 2016-11-11 DIAGNOSIS — R0602 Shortness of breath: Secondary | ICD-10-CM | POA: Diagnosis not present

## 2016-11-11 DIAGNOSIS — R05 Cough: Secondary | ICD-10-CM | POA: Diagnosis not present

## 2016-11-11 LAB — COMPREHENSIVE METABOLIC PANEL
ALT: 12 U/L — AB (ref 14–54)
AST: 16 U/L (ref 15–41)
Albumin: 3.5 g/dL (ref 3.5–5.0)
Alkaline Phosphatase: 73 U/L (ref 38–126)
Anion gap: 5 (ref 5–15)
BUN: 19 mg/dL (ref 6–20)
CHLORIDE: 106 mmol/L (ref 101–111)
CO2: 25 mmol/L (ref 22–32)
CREATININE: 1.25 mg/dL — AB (ref 0.44–1.00)
Calcium: 9.1 mg/dL (ref 8.9–10.3)
GFR calc Af Amer: 49 mL/min — ABNORMAL LOW (ref >60)
GFR calc non Af Amer: 42 mL/min — ABNORMAL LOW (ref >60)
GLUCOSE: 106 mg/dL — AB (ref 65–99)
POTASSIUM: 4 mmol/L (ref 3.5–5.1)
SODIUM: 136 mmol/L (ref 135–145)
Total Bilirubin: 0.4 mg/dL (ref 0.3–1.2)
Total Protein: 6.3 g/dL — ABNORMAL LOW (ref 6.5–8.1)

## 2016-11-11 LAB — CBC WITH DIFFERENTIAL/PLATELET
Basophils Absolute: 0.1 10*3/uL (ref 0–0.1)
Basophils Relative: 1 %
EOS ABS: 0.2 10*3/uL (ref 0–0.7)
EOS PCT: 3 %
HEMATOCRIT: 36.8 % (ref 35.0–47.0)
HEMOGLOBIN: 12.4 g/dL (ref 12.0–16.0)
LYMPHS ABS: 2.5 10*3/uL (ref 1.0–3.6)
LYMPHS PCT: 29 %
MCH: 35 pg — ABNORMAL HIGH (ref 26.0–34.0)
MCHC: 33.7 g/dL (ref 32.0–36.0)
MCV: 104 fL — ABNORMAL HIGH (ref 80.0–100.0)
MONO ABS: 0.7 10*3/uL (ref 0.2–0.9)
Monocytes Relative: 8 %
Neutro Abs: 5.1 10*3/uL (ref 1.4–6.5)
Neutrophils Relative %: 59 %
Platelets: 261 10*3/uL (ref 150–440)
RBC: 3.54 MIL/uL — AB (ref 3.80–5.20)
RDW: 13.2 % (ref 11.5–14.5)
WBC: 8.6 10*3/uL (ref 3.6–11.0)

## 2016-11-11 LAB — TROPONIN I: Troponin I: <0.03 ng/mL (ref <0.03)

## 2016-11-11 MED ORDER — PREDNISONE 20 MG PO TABS
60.0000 mg | ORAL_TABLET | Freq: Once | ORAL | Status: AC
  Administered 2016-11-11: 60 mg via ORAL
  Filled 2016-11-11: qty 3

## 2016-11-11 MED ORDER — AZITHROMYCIN 250 MG PO TABS: ORAL_TABLET | ORAL | 0 refills | Status: AC

## 2016-11-11 MED ORDER — PREDNISONE 10 MG PO TABS: 50.0000 mg | ORAL_TABLET | Freq: Every day | ORAL | 0 refills | Status: AC

## 2016-11-11 MED ORDER — IPRATROPIUM-ALBUTEROL 0.5-2.5 (3) MG/3ML IN SOLN
3.0000 mL | Freq: Once | RESPIRATORY_TRACT | Status: AC
  Administered 2016-11-11: 3 mL via RESPIRATORY_TRACT
  Filled 2016-11-11: qty 3

## 2016-11-11 NOTE — ED Notes (Signed)
Pt. Going back to [facility] via daughter.

## 2016-11-11 NOTE — ED Triage Notes (Signed)
Pt. Reports becoming short of breath tonight.  Pt. Resides at [facility].

## 2016-11-11 NOTE — ED Provider Notes (Signed)
[facility] Emergency Department Provider Note  ____________________________________________   First MD Initiated Contact with Patient 11/11/16 [phone]     (approximate)  I have reviewed the triage vital signs and the nursing notes.   HISTORY  Chief Complaint Shortness of Breath (Pt. here from [facility] via EMS)    HPI Diana Mccoy is a 71 y.o. female who comes to the emergency department via EMS for 2 days of worsening shortness of breath. She has a past medical history of COPD and lives at a nursing facility where she is allowed to use her albuterol twice a day. She denies fevers or chills. She reports somewhat increasing cough. She denies chest pain. She also reports an ingrown toenail on her left great toe and would like it evaluated. She is concerned that she does not have access to her albuterol as much and she would like at her nursing home. Her symptoms have been slowly progressive for the past 48 hours. Improved with albuterol. Worsened when she does not have access to albuterol.   Past Medical History:  Diagnosis Date  . Acid reflux 12/28/2014  . 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 Anxiety 12/28/2014  . BP (high blood pressure) 12/28/2014  . CAFL (chronic airflow limitation) (HCC) 12/28/2014  . COPD (chronic obstructive pulmonary disease) (HCC) 01/19/2016  . Depression 03/28/2015  . 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 Disorder of kidney 12/28/2014  . Gait instability 04/18/2015  . Osteoarthritis 03/28/2015  . 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 Schizophrenia (HCC) 03/28/2015  . Scoliosis 12/28/2014  . Spinal stenosis 12/28/2014    Patient Active Problem List   Diagnosis Date Noted  . 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 COPD (chronic obstructive pulmonary disease) (HCC) 01/19/2016  . Gait instability 04/18/2015  . Schizophrenia (HCC) 03/28/2015  . Depression 03/28/2015  . Osteoarthritis 03/28/2015  . Elevated WBC count 01/31/2015   . Anxiety 12/28/2014  . 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 CAFL (chronic airflow limitation) (HCC) 12/28/2014  . 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 Acid reflux 12/28/2014  . BP (high blood pressure) 12/28/2014  . Disorder of kidney 12/28/2014  . Scoliosis 12/28/2014  . Spinal stenosis 12/28/2014    Past Surgical History:  Procedure Laterality Date  . HIP FRACTURE SURGERY Left     Prior to Admission medications   Medication Sig Start Date End Date Taking? Authorizing Provider  acetaminophen (TYLENOL) 325 MG tablet Take 650 mg by mouth.    [provider]  albuterol (PROVENTIL) (2.5 MG/3ML) 0.083% nebulizer solution Take 3 mLs (2.5 mg total) by nebulization every 4 (four) hours as needed for wheezing or shortness of breath. 04/18/15   Lorie Phenix, MD  albuterol (VENTOLIN HFA) 108 (90 BASE) MCG/ACT inhaler Inhale 2 puffs into the lungs every 4 (four) hours as needed.  08/11/14   [provider]  alendronate (FOSAMAX) 70 MG tablet Take 70 mg by mouth once a week.     [provider]  azithromycin (ZITHROMAX Z-PAK) 250 MG tablet Take 2 tablets (500 mg) on  Day 1,  followed by 1 tablet (250 mg) once daily on Days 2 through 5. 11/11/16 11/16/16  Merrily Brittle, MD  buPROPion [facility] SR) 150 MG 12 hr tablet TAKE 1 TABLET BY MOUTH TWO TIMES A DAY 04/06/16   Malva Limes, MD  Calcium Carb-Cholecalciferol (OYSTER SHELL CALCIUM PLUS D) 500-200 MG-UNIT TABS Take 1 tablet by mouth 3 (three) times daily.     [provider]  clonazePAM (KLONOPIN) 0.5 MG tablet Take 1 tablet (0.5 mg total) by mouth 3 (three) times daily. 11/02/16   Malva Limes, MD  docusate sodium (COLACE) 100 MG capsule Take 100 mg by mouth 2 (two) times daily.    [provider]  esomeprazole (NEXIUM) 40 MG capsule Take 1 capsule (40 mg total) by mouth daily at 12 noon. 02/09/16   Malva Limes, MD  ferrous sulfate 325 (65 FE) MG tablet Take 325 mg by mouth 2  (two) times daily with a meal.     [provider]  glycopyrrolate (ROBINUL) 2 MG tablet Take 2 mg by mouth 3 (three) times daily. Reported on 10/20/2015 04/10/13   [provider]  HYDROcodone-acetaminophen (NORCO) 7.5-325 MG tablet Take 1 tablet by mouth every 6 (six) hours as needed for moderate pain. 11/08/16   Malva Limes, MD  loratadine (CLARITIN) 10 MG tablet Take 10 mg by mouth daily.     [provider]  Magnesium Hydroxide (MILK OF MAGNESIA CONCENTRATE) 2400 MG/10ML SUSP Take 30 mLs by mouth.     [provider]  meloxicam (MOBIC) 7.5 MG tablet Take 7.5 mg by mouth 2 (two) times daily.    [provider]  mometasone (ELOCON) 0.1 % cream Apply 1 application topically daily.    [provider]  nystatin ointment (MYCOSTATIN) NYSTATIN, 100000 UNIT/GM (External Ointment)  1 Ointment Ointment Apply to affected area twice a day for 0 days  Quantity: 60;  Refills: 0   Ordered :14-Aug-2013  Lorie Phenix MD;  Started 14-Aug-2013 Active 08/14/13   [provider]  paliperidone (INVEGA SUSTENNA) 156 MG/ML SUSP injection Inject 156 mg into the muscle every 30 (thirty) days.    [provider]  predniSONE (DELTASONE) 10 MG tablet Take 5 tablets (50 mg total) by mouth daily. 11/11/16 11/16/16  Merrily Brittle, MD  tiotropium (SPIRIVA HANDIHALER) 18 MCG inhalation capsule Place 1 capsule (18 mcg total) into inhaler and inhale daily. 11/08/16   Malva Limes, MD  traZODone (DESYREL) 100 MG tablet Take 100 mg by mouth at bedtime.    [provider]  trimethoprim (TRIMPEX) 100 MG tablet TAKE ONE TABLET BY MOUTH EVERY DAY 06/17/15   Lorie Phenix, MD  Vitamin D, Ergocalciferol, (DRISDOL) 50000 UNITS CAPS capsule Take 50,000 Units by mouth every 30 (thirty) days.  04/10/13   [provider]  zonisamide (ZONEGRAN) 100 MG capsule Take 200 mg by mouth daily.  04/10/13   [provider]     Allergies Ciprofloxacin; Penicillins; Sodium thiosulfate; and Sulfa antibiotics  Family History  Problem Relation Age of Onset  . Gout Father     Social History Social History  Substance Use Topics  . Smoking status: Current Every Day Smoker    Packs/day: 1.00    Years: 30.00    Types: Cigarettes  . Smokeless tobacco: Never Used  . Alcohol use No    Review of Systems Constitutional: No fever/chills Eyes: No visual changes. ENT: No sore throat. Cardiovascular: Denies chest pain. Respiratory: Positive shortness of breath. Gastrointestinal: No abdominal pain.  No nausea, no vomiting.  No diarrhea.  No constipation. Genitourinary: Negative for dysuria. Musculoskeletal: Negative for back pain. Skin: Negative for rash. Neurological: Negative for headaches, focal weakness or numbness.  10-point ROS otherwise negative.  ____________________________________________   PHYSICAL EXAM:  VITAL SIGNS: ED Triage Vitals  Enc Vitals Group     BP 11/11/16 0241 133/64     Pulse Rate 11/11/16  0241 92     Resp 11/11/16 0241 18     Temp 11/11/16 0241 97.8 F (36.6 C)     Temp Source 11/11/16 0241 Oral     SpO2 11/11/16 0234 98 %     Weight 11/11/16 0242 166 lb (75.3 kg)     Height 11/11/16 0242 5\' 5"  (1.651 m)     Head Circumference --      Peak Flow --      Pain Score 11/11/16 0237 0     Pain Loc --      Pain Edu? --      Excl. in GC? --     Constitutional: Alert and oriented x 4 well appearing nontoxic no diaphoresis speaks in full, clear sentences Eyes: PERRL EOMI. Head: Atraumatic. Nose: No congestion/rhinnorhea. Mouth/Throat: No trismus Neck: No stridor.   Cardiovascular: Normal rate, regular rhythm. Grossly normal heart sounds.  Good peripheral circulation. Respiratory: No acute respiratory distress mild expiratory wheezes throughout but moving good air Gastrointestinal: Soft nontender Musculoskeletal: Left great toe slightly tender medially  Neurologic:   Normal speech and language. No gross focal neurologic deficits are appreciated. Skin:  Skin is warm, dry and intact. No rash noted. Psychiatric: Mood and affect are normal. Speech and behavior are normal.    ____________________________________________  ____________________________________________   LABS (all labs ordered are listed, but only abnormal results are displayed)  Labs Reviewed  COMPREHENSIVE METABOLIC PANEL - Abnormal; Notable for the following:       Result Value   Glucose, Bld 106 (*)    Creatinine, Ser 1.25 (*)    Total Protein 6.3 (*)    ALT 12 (*)    GFR calc non Af Amer 42 (*)    GFR calc Af Amer 49 (*)    All other components within normal limits  CBC WITH DIFFERENTIAL/PLATELET - Abnormal; Notable for the following:    RBC 3.54 (*)    MCV 104.0 (*)    MCH 35.0 (*)    All other components within normal limits  TROPONIN I    No signs of acute ischemia __________________________________________  EKG  ED ECG REPORT I, Merrily BrittleNeil Quaron Delacruz, the attending physician, personally viewed and interpreted this ECG.  Date: 11/11/2016 Rate: 92 Rhythm: normal sinus rhythm QRS Axis: normal Intervals: normal ST/T Wave abnormalities: normal Conduction Disturbances: none Narrative Interpretation: unremarkable  ____________________________________________  RADIOLOGY chest x-ray with no acute disease ____________________________________________   PROCEDURES  Procedure(s) performed: no  Procedures  Critical Care performed: no  Observation: no ____________________________________________   INITIAL IMPRESSION / ASSESSMENT AND PLAN / ED COURSE  Pertinent labs & imaging results that were available during my care of the patient were reviewed by me and considered in my medical decision making (see chart for details).  The patient arrives very well-appearing with mild shortness of breath and mild wheeze. She says she feels subjectively better after nebulization  and requests a second. I do not believe she warrants IV access or IV steroids but I will give her oral prednisone. Regarding her ingrown toenail I will refer her to podiatry.  After another nebulization the patient feels better and her lungs are clear. At this point she is medically stable for outpatient management understands and agrees the plan.      ____________________________________________   FINAL CLINICAL IMPRESSION(S) / ED DIAGNOSES  Final diagnoses:  COPD exacerbation (HCC)  Ingrown toenail      NEW MEDICATIONS STARTED DURING THIS VISIT:  Discharge Medication List as of 11/11/2016  5:20 AM    START taking these medications   Details  azithromycin (ZITHROMAX Z-PAK) 250 MG tablet Take 2 tablets (500 mg) on  Day 1,  followed by 1 tablet (250 mg) once daily on Days 2 through 5., Print    predniSONE (DELTASONE) 10 MG tablet Take 5 tablets (50 mg total) by mouth daily., Starting Sun 11/11/2016, Until Fri 11/16/2016, Print         Note:  This document was prepared using Dragon voice recognition software and may include unintentional dictation errors.     Merrily Brittle, MD 11/11/16 [phone]

## 2016-11-11 NOTE — Discharge Instructions (Signed)
Please take all of your steroids and antibiotics as prescribed and return to the emergency department for any concerns. Please make an appointment to follow-up with podiatry regarding ingrown toenail.  It was a pleasure to take care of you today, and thank you for coming to our emergency department.  If you have any questions or concerns before leaving please ask the nurse to grab me and I'm more than happy to go through your aftercare instructions again.  If you were prescribed any opioid pain medication today such as Norco, Vicodin, Percocet, morphine, hydrocodone, or oxycodone please make sure you do not drive when you are taking this medication as it can alter your ability to drive safely.  If you have any concerns once you are home that you are not improving or are in fact getting worse before you can make it to your follow-up appointment, please do not hesitate to call 911 and come back for further evaluation.  Merrily BrittleNeil Deyon Chizek MD  Results for orders placed or performed during the hospital encounter of 11/11/16  Comprehensive metabolic panel  Result Value Ref Range   Sodium 136 135 - 145 mmol/L   Potassium 4.0 3.5 - 5.1 mmol/L   Chloride 106 101 - 111 mmol/L   CO2 25 22 - 32 mmol/L   Glucose, Bld 106 (H) 65 - 99 mg/dL   BUN 19 6 - 20 mg/dL   Creatinine, Ser 4.091.25 (H) 0.44 - 1.00 mg/dL   Calcium 9.1 8.9 - 81.110.3 mg/dL   Total Protein 6.3 (L) 6.5 - 8.1 g/dL   Albumin 3.5 3.5 - 5.0 g/dL   AST 16 15 - 41 U/L   ALT 12 (L) 14 - 54 U/L   Alkaline Phosphatase 73 38 - 126 U/L   Total Bilirubin 0.4 0.3 - 1.2 mg/dL   GFR calc non Af Amer 42 (L) >60 mL/min   GFR calc Af Amer 49 (L) >60 mL/min   Anion gap 5 5 - 15  CBC with Differential  Result Value Ref Range   WBC 8.6 3.6 - 11.0 K/uL   RBC 3.54 (L) 3.80 - 5.20 MIL/uL   Hemoglobin 12.4 12.0 - 16.0 g/dL   HCT 91.436.8 78.235.0 - 95.647.0 %   MCV 104.0 (H) 80.0 - 100.0 fL   MCH 35.0 (H) 26.0 - 34.0 pg   MCHC 33.7 32.0 - 36.0 g/dL   RDW 21.313.2 08.611.5 - 57.814.5  %   Platelets 261 150 - 440 K/uL   Neutrophils Relative % 59 %   Neutro Abs 5.1 1.4 - 6.5 K/uL   Lymphocytes Relative 29 %   Lymphs Abs 2.5 1.0 - 3.6 K/uL   Monocytes Relative 8 %   Monocytes Absolute 0.7 0.2 - 0.9 K/uL   Eosinophils Relative 3 %   Eosinophils Absolute 0.2 0 - 0.7 K/uL   Basophils Relative 1 %   Basophils Absolute 0.1 0 - 0.1 K/uL  Troponin I  Result Value Ref Range   Troponin I <0.03 <0.03 ng/mL   Dg Chest 2 View  Result Date: 11/11/2016 CLINICAL DATA:  Shortness of breath this morning. Cough. History of COPD. EXAM: CHEST  2 VIEW COMPARISON:  01/18/2014 FINDINGS: The heart size and mediastinal contours are within normal limits. Both lungs are clear. The visualized skeletal structures are unremarkable. IMPRESSION: No active cardiopulmonary disease. Electronically Signed   By: Burman NievesWilliam  Stevens M.D.   On: 11/11/2016 03:12

## 2016-11-12 DIAGNOSIS — R269 Unspecified abnormalities of gait and mobility: Secondary | ICD-10-CM | POA: Diagnosis not present

## 2016-11-12 DIAGNOSIS — F209 Schizophrenia, unspecified: Secondary | ICD-10-CM | POA: Diagnosis not present

## 2016-11-12 DIAGNOSIS — J449 Chronic obstructive pulmonary disease, unspecified: Secondary | ICD-10-CM | POA: Diagnosis not present

## 2016-11-12 DIAGNOSIS — R531 Weakness: Secondary | ICD-10-CM | POA: Diagnosis not present

## 2016-11-12 DIAGNOSIS — I10 Essential (primary) hypertension: Secondary | ICD-10-CM | POA: Diagnosis not present

## 2016-11-12 DIAGNOSIS — M1991 Primary osteoarthritis, unspecified site: Secondary | ICD-10-CM | POA: Diagnosis not present

## 2016-11-14 DIAGNOSIS — I10 Essential (primary) hypertension: Secondary | ICD-10-CM | POA: Diagnosis not present

## 2016-11-14 DIAGNOSIS — M1991 Primary osteoarthritis, unspecified site: Secondary | ICD-10-CM | POA: Diagnosis not present

## 2016-11-14 DIAGNOSIS — F209 Schizophrenia, unspecified: Secondary | ICD-10-CM | POA: Diagnosis not present

## 2016-11-14 DIAGNOSIS — J449 Chronic obstructive pulmonary disease, unspecified: Secondary | ICD-10-CM | POA: Diagnosis not present

## 2016-11-14 DIAGNOSIS — R269 Unspecified abnormalities of gait and mobility: Secondary | ICD-10-CM | POA: Diagnosis not present

## 2016-11-14 DIAGNOSIS — R531 Weakness: Secondary | ICD-10-CM | POA: Diagnosis not present

## 2016-11-16 ENCOUNTER — Other Ambulatory Visit: Payer: Self-pay | Admitting: Family Medicine

## 2016-11-16 MED ORDER — UMECLIDINIUM BROMIDE 62.5 MCG/INH IN AEPB: 1.0000 | INHALATION_SPRAY | Freq: Every day | RESPIRATORY_TRACT | 12 refills | Status: DC

## 2016-11-16 NOTE — Progress Notes (Signed)
Change Spirva to Incruse due to formulary per [facility].

## 2016-11-19 ENCOUNTER — Other Ambulatory Visit: Payer: Self-pay | Admitting: Family Medicine

## 2016-11-19 DIAGNOSIS — J449 Chronic obstructive pulmonary disease, unspecified: Secondary | ICD-10-CM | POA: Diagnosis not present

## 2016-11-19 DIAGNOSIS — M1991 Primary osteoarthritis, unspecified site: Secondary | ICD-10-CM | POA: Diagnosis not present

## 2016-11-19 DIAGNOSIS — M5410 Radiculopathy, site unspecified: Secondary | ICD-10-CM

## 2016-11-19 DIAGNOSIS — R531 Weakness: Secondary | ICD-10-CM | POA: Diagnosis not present

## 2016-11-19 DIAGNOSIS — F209 Schizophrenia, unspecified: Secondary | ICD-10-CM | POA: Diagnosis not present

## 2016-11-19 DIAGNOSIS — R269 Unspecified abnormalities of gait and mobility: Secondary | ICD-10-CM | POA: Diagnosis not present

## 2016-11-19 DIAGNOSIS — I10 Essential (primary) hypertension: Secondary | ICD-10-CM | POA: Diagnosis not present

## 2016-11-19 MED ORDER — HYDROCODONE-ACETAMINOPHEN 7.5-325 MG PO TABS: 1.0000 | ORAL_TABLET | Freq: Four times a day (QID) | ORAL | 0 refills | Status: DC | PRN

## 2016-11-19 NOTE — Telephone Encounter (Signed)
Pt needs refill   HYDROcodone-acetaminophen (NORCO) 7.5-325 MG tablet  Thanks teri

## 2016-11-19 NOTE — Telephone Encounter (Signed)
Has appointment 01/24/2017. Last refill 11/08/2016. [name], CMA

## 2016-11-22 DIAGNOSIS — M1991 Primary osteoarthritis, unspecified site: Secondary | ICD-10-CM | POA: Diagnosis not present

## 2016-11-22 DIAGNOSIS — R531 Weakness: Secondary | ICD-10-CM | POA: Diagnosis not present

## 2016-11-22 DIAGNOSIS — I10 Essential (primary) hypertension: Secondary | ICD-10-CM | POA: Diagnosis not present

## 2016-11-22 DIAGNOSIS — R269 Unspecified abnormalities of gait and mobility: Secondary | ICD-10-CM | POA: Diagnosis not present

## 2016-11-22 DIAGNOSIS — F209 Schizophrenia, unspecified: Secondary | ICD-10-CM | POA: Diagnosis not present

## 2016-11-22 DIAGNOSIS — J449 Chronic obstructive pulmonary disease, unspecified: Secondary | ICD-10-CM | POA: Diagnosis not present

## 2016-11-27 ENCOUNTER — Other Ambulatory Visit: Payer: Self-pay | Admitting: Family Medicine

## 2016-11-27 DIAGNOSIS — M5410 Radiculopathy, site unspecified: Secondary | ICD-10-CM

## 2016-11-27 NOTE — Telephone Encounter (Signed)
Please see message below

## 2016-11-27 NOTE — Telephone Encounter (Signed)
1. Diana Mccoy with [facility] request a new Rx for HYDROcodone-acetaminophen (NORCO) 7.5-325 MG tablet be faxed to [facility] and to contact [facility] when the Rx is ready to be picked up.  2. Diana Mccoy also stated that pt doesn't feel that the tiotropium (SPIRIVA HANDIHALER) 18 MCG inhalation capsule is helping and no longer wishes to take the medication. Diana Mccoy request that we send a discontinue order for the medication. Please advise. Thanks TNP

## 2016-11-28 ENCOUNTER — Telehealth: Payer: Self-pay | Admitting: Family Medicine

## 2016-11-28 DIAGNOSIS — J449 Chronic obstructive pulmonary disease, unspecified: Secondary | ICD-10-CM | POA: Diagnosis not present

## 2016-11-28 DIAGNOSIS — R531 Weakness: Secondary | ICD-10-CM | POA: Diagnosis not present

## 2016-11-28 DIAGNOSIS — F209 Schizophrenia, unspecified: Secondary | ICD-10-CM | POA: Diagnosis not present

## 2016-11-28 DIAGNOSIS — R269 Unspecified abnormalities of gait and mobility: Secondary | ICD-10-CM | POA: Diagnosis not present

## 2016-11-28 DIAGNOSIS — I10 Essential (primary) hypertension: Secondary | ICD-10-CM | POA: Diagnosis not present

## 2016-11-28 DIAGNOSIS — M1991 Primary osteoarthritis, unspecified site: Secondary | ICD-10-CM | POA: Diagnosis not present

## 2016-11-28 MED ORDER — HYDROCODONE-ACETAMINOPHEN 7.5-325 MG PO TABS: 1.0000 | ORAL_TABLET | Freq: Four times a day (QID) | ORAL | 0 refills | Status: DC | PRN

## 2016-11-28 NOTE — Telephone Encounter (Signed)
Spiriva was already discontinued and changed to Incruse on 11-16-2016

## 2016-11-28 NOTE — Telephone Encounter (Signed)
Rx faxed to pharmacy. [facility] notified.

## 2016-11-28 NOTE — Telephone Encounter (Signed)
Error/MW °

## 2016-11-29 DIAGNOSIS — M1991 Primary osteoarthritis, unspecified site: Secondary | ICD-10-CM | POA: Diagnosis not present

## 2016-11-29 DIAGNOSIS — J449 Chronic obstructive pulmonary disease, unspecified: Secondary | ICD-10-CM | POA: Diagnosis not present

## 2016-11-29 DIAGNOSIS — R269 Unspecified abnormalities of gait and mobility: Secondary | ICD-10-CM | POA: Diagnosis not present

## 2016-11-29 DIAGNOSIS — F209 Schizophrenia, unspecified: Secondary | ICD-10-CM | POA: Diagnosis not present

## 2016-11-29 DIAGNOSIS — I10 Essential (primary) hypertension: Secondary | ICD-10-CM | POA: Diagnosis not present

## 2016-11-29 DIAGNOSIS — R531 Weakness: Secondary | ICD-10-CM | POA: Diagnosis not present

## 2016-12-04 ENCOUNTER — Other Ambulatory Visit: Payer: Self-pay | Admitting: Family Medicine

## 2016-12-04 DIAGNOSIS — L600 Ingrowing nail: Secondary | ICD-10-CM | POA: Diagnosis not present

## 2016-12-04 DIAGNOSIS — M79675 Pain in left toe(s): Secondary | ICD-10-CM | POA: Diagnosis not present

## 2016-12-04 DIAGNOSIS — B351 Tinea unguium: Secondary | ICD-10-CM | POA: Diagnosis not present

## 2016-12-04 NOTE — Telephone Encounter (Signed)
This was filled on 11/02/16 with 3 refills to [facility]

## 2016-12-04 NOTE — Telephone Encounter (Signed)
Pt contacted office for refill request on the following medications:  clonazePAM (KLONOPIN) 0.5 MG tablet.  [facility].  ZO#[phone]/WJCB#[phone]/MW

## 2016-12-05 DIAGNOSIS — J449 Chronic obstructive pulmonary disease, unspecified: Secondary | ICD-10-CM | POA: Diagnosis not present

## 2016-12-05 DIAGNOSIS — R531 Weakness: Secondary | ICD-10-CM | POA: Diagnosis not present

## 2016-12-05 DIAGNOSIS — M1991 Primary osteoarthritis, unspecified site: Secondary | ICD-10-CM | POA: Diagnosis not present

## 2016-12-05 DIAGNOSIS — F209 Schizophrenia, unspecified: Secondary | ICD-10-CM | POA: Diagnosis not present

## 2016-12-05 DIAGNOSIS — I10 Essential (primary) hypertension: Secondary | ICD-10-CM | POA: Diagnosis not present

## 2016-12-05 DIAGNOSIS — R269 Unspecified abnormalities of gait and mobility: Secondary | ICD-10-CM | POA: Diagnosis not present

## 2016-12-06 DIAGNOSIS — R269 Unspecified abnormalities of gait and mobility: Secondary | ICD-10-CM | POA: Diagnosis not present

## 2016-12-06 DIAGNOSIS — J449 Chronic obstructive pulmonary disease, unspecified: Secondary | ICD-10-CM | POA: Diagnosis not present

## 2016-12-06 DIAGNOSIS — M1991 Primary osteoarthritis, unspecified site: Secondary | ICD-10-CM | POA: Diagnosis not present

## 2016-12-06 DIAGNOSIS — F209 Schizophrenia, unspecified: Secondary | ICD-10-CM | POA: Diagnosis not present

## 2016-12-06 DIAGNOSIS — R531 Weakness: Secondary | ICD-10-CM | POA: Diagnosis not present

## 2016-12-06 DIAGNOSIS — I10 Essential (primary) hypertension: Secondary | ICD-10-CM | POA: Diagnosis not present

## 2016-12-10 ENCOUNTER — Other Ambulatory Visit: Payer: Self-pay | Admitting: Family Medicine

## 2016-12-10 DIAGNOSIS — M5410 Radiculopathy, site unspecified: Secondary | ICD-10-CM

## 2016-12-10 DIAGNOSIS — R269 Unspecified abnormalities of gait and mobility: Secondary | ICD-10-CM | POA: Diagnosis not present

## 2016-12-10 DIAGNOSIS — J449 Chronic obstructive pulmonary disease, unspecified: Secondary | ICD-10-CM | POA: Diagnosis not present

## 2016-12-10 DIAGNOSIS — I10 Essential (primary) hypertension: Secondary | ICD-10-CM | POA: Diagnosis not present

## 2016-12-10 DIAGNOSIS — R531 Weakness: Secondary | ICD-10-CM | POA: Diagnosis not present

## 2016-12-10 DIAGNOSIS — M1991 Primary osteoarthritis, unspecified site: Secondary | ICD-10-CM | POA: Diagnosis not present

## 2016-12-10 DIAGNOSIS — F209 Schizophrenia, unspecified: Secondary | ICD-10-CM | POA: Diagnosis not present

## 2016-12-10 MED ORDER — HYDROCODONE-ACETAMINOPHEN 7.5-325 MG PO TABS: 1.0000 | ORAL_TABLET | Freq: Four times a day (QID) | ORAL | 0 refills | Status: DC | PRN

## 2016-12-10 NOTE — Telephone Encounter (Signed)
[facility] contacted office for refill request on the following medications:    HYDROcodone-acetaminophen (NORCO) 7.5-325 MG tablet.  ZO#[phone]/WJCB#[phone]/MW

## 2016-12-12 DIAGNOSIS — R269 Unspecified abnormalities of gait and mobility: Secondary | ICD-10-CM | POA: Diagnosis not present

## 2016-12-12 DIAGNOSIS — I10 Essential (primary) hypertension: Secondary | ICD-10-CM | POA: Diagnosis not present

## 2016-12-12 DIAGNOSIS — J449 Chronic obstructive pulmonary disease, unspecified: Secondary | ICD-10-CM | POA: Diagnosis not present

## 2016-12-12 DIAGNOSIS — M1991 Primary osteoarthritis, unspecified site: Secondary | ICD-10-CM | POA: Diagnosis not present

## 2016-12-12 DIAGNOSIS — R531 Weakness: Secondary | ICD-10-CM | POA: Diagnosis not present

## 2016-12-12 DIAGNOSIS — F209 Schizophrenia, unspecified: Secondary | ICD-10-CM | POA: Diagnosis not present

## 2016-12-17 DIAGNOSIS — I10 Essential (primary) hypertension: Secondary | ICD-10-CM | POA: Diagnosis not present

## 2016-12-17 DIAGNOSIS — F209 Schizophrenia, unspecified: Secondary | ICD-10-CM | POA: Diagnosis not present

## 2016-12-17 DIAGNOSIS — R269 Unspecified abnormalities of gait and mobility: Secondary | ICD-10-CM | POA: Diagnosis not present

## 2016-12-17 DIAGNOSIS — M1991 Primary osteoarthritis, unspecified site: Secondary | ICD-10-CM | POA: Diagnosis not present

## 2016-12-17 DIAGNOSIS — R531 Weakness: Secondary | ICD-10-CM | POA: Diagnosis not present

## 2016-12-17 DIAGNOSIS — J449 Chronic obstructive pulmonary disease, unspecified: Secondary | ICD-10-CM | POA: Diagnosis not present

## 2016-12-19 ENCOUNTER — Other Ambulatory Visit: Payer: Self-pay | Admitting: Family Medicine

## 2016-12-19 DIAGNOSIS — I10 Essential (primary) hypertension: Secondary | ICD-10-CM | POA: Diagnosis not present

## 2016-12-19 DIAGNOSIS — F209 Schizophrenia, unspecified: Secondary | ICD-10-CM | POA: Diagnosis not present

## 2016-12-19 DIAGNOSIS — M5410 Radiculopathy, site unspecified: Secondary | ICD-10-CM

## 2016-12-19 DIAGNOSIS — J449 Chronic obstructive pulmonary disease, unspecified: Secondary | ICD-10-CM | POA: Diagnosis not present

## 2016-12-19 DIAGNOSIS — M1991 Primary osteoarthritis, unspecified site: Secondary | ICD-10-CM | POA: Diagnosis not present

## 2016-12-19 DIAGNOSIS — R531 Weakness: Secondary | ICD-10-CM | POA: Diagnosis not present

## 2016-12-19 DIAGNOSIS — R269 Unspecified abnormalities of gait and mobility: Secondary | ICD-10-CM | POA: Diagnosis not present

## 2016-12-19 NOTE — Telephone Encounter (Signed)
Pt contacted office for refill request on the following medications:  HYDROcodone-acetaminophen (NORCO) 7.5-325 MG tablet.  ZO#[phone]/WJCB#[phone]/MW

## 2016-12-20 MED ORDER — HYDROCODONE-ACETAMINOPHEN 7.5-325 MG PO TABS: 1.0000 | ORAL_TABLET | Freq: Four times a day (QID) | ORAL | 0 refills | Status: DC | PRN

## 2016-12-24 DIAGNOSIS — I10 Essential (primary) hypertension: Secondary | ICD-10-CM | POA: Diagnosis not present

## 2016-12-24 DIAGNOSIS — J449 Chronic obstructive pulmonary disease, unspecified: Secondary | ICD-10-CM | POA: Diagnosis not present

## 2016-12-24 DIAGNOSIS — M1991 Primary osteoarthritis, unspecified site: Secondary | ICD-10-CM | POA: Diagnosis not present

## 2016-12-24 DIAGNOSIS — R269 Unspecified abnormalities of gait and mobility: Secondary | ICD-10-CM | POA: Diagnosis not present

## 2016-12-24 DIAGNOSIS — R531 Weakness: Secondary | ICD-10-CM | POA: Diagnosis not present

## 2016-12-24 DIAGNOSIS — F209 Schizophrenia, unspecified: Secondary | ICD-10-CM | POA: Diagnosis not present

## 2016-12-26 DIAGNOSIS — I10 Essential (primary) hypertension: Secondary | ICD-10-CM | POA: Diagnosis not present

## 2016-12-26 DIAGNOSIS — M1991 Primary osteoarthritis, unspecified site: Secondary | ICD-10-CM | POA: Diagnosis not present

## 2016-12-26 DIAGNOSIS — R269 Unspecified abnormalities of gait and mobility: Secondary | ICD-10-CM | POA: Diagnosis not present

## 2016-12-26 DIAGNOSIS — R531 Weakness: Secondary | ICD-10-CM | POA: Diagnosis not present

## 2016-12-26 DIAGNOSIS — F209 Schizophrenia, unspecified: Secondary | ICD-10-CM | POA: Diagnosis not present

## 2016-12-26 DIAGNOSIS — J449 Chronic obstructive pulmonary disease, unspecified: Secondary | ICD-10-CM | POA: Diagnosis not present

## 2016-12-28 ENCOUNTER — Telehealth: Payer: Self-pay | Admitting: Family Medicine

## 2016-12-28 DIAGNOSIS — I6521 Occlusion and stenosis of right carotid artery: Secondary | ICD-10-CM | POA: Diagnosis not present

## 2016-12-28 DIAGNOSIS — M5410 Radiculopathy, site unspecified: Secondary | ICD-10-CM

## 2016-12-28 DIAGNOSIS — R4182 Altered mental status, unspecified: Secondary | ICD-10-CM | POA: Diagnosis not present

## 2016-12-28 DIAGNOSIS — I639 Cerebral infarction, unspecified: Secondary | ICD-10-CM | POA: Diagnosis not present

## 2016-12-28 DIAGNOSIS — I69354 Hemiplegia and hemiparesis following cerebral infarction affecting left non-dominant side: Secondary | ICD-10-CM | POA: Insufficient documentation

## 2016-12-28 DIAGNOSIS — I6601 Occlusion and stenosis of right middle cerebral artery: Secondary | ICD-10-CM | POA: Diagnosis not present

## 2016-12-28 DIAGNOSIS — R079 Chest pain, unspecified: Secondary | ICD-10-CM | POA: Diagnosis not present

## 2016-12-28 DIAGNOSIS — F209 Schizophrenia, unspecified: Secondary | ICD-10-CM | POA: Diagnosis not present

## 2016-12-28 DIAGNOSIS — I63511 Cerebral infarction due to unspecified occlusion or stenosis of right middle cerebral artery: Secondary | ICD-10-CM | POA: Diagnosis not present

## 2016-12-28 DIAGNOSIS — J449 Chronic obstructive pulmonary disease, unspecified: Secondary | ICD-10-CM | POA: Diagnosis not present

## 2016-12-28 DIAGNOSIS — Z4682 Encounter for fitting and adjustment of non-vascular catheter: Secondary | ICD-10-CM | POA: Diagnosis not present

## 2016-12-28 DIAGNOSIS — I6523 Occlusion and stenosis of bilateral carotid arteries: Secondary | ICD-10-CM | POA: Diagnosis not present

## 2016-12-28 DIAGNOSIS — G8194 Hemiplegia, unspecified affecting left nondominant side: Secondary | ICD-10-CM | POA: Diagnosis not present

## 2016-12-28 DIAGNOSIS — E875 Hyperkalemia: Secondary | ICD-10-CM | POA: Diagnosis not present

## 2016-12-28 DIAGNOSIS — N390 Urinary tract infection, site not specified: Secondary | ICD-10-CM | POA: Diagnosis not present

## 2016-12-28 DIAGNOSIS — R4781 Slurred speech: Secondary | ICD-10-CM | POA: Diagnosis not present

## 2016-12-28 DIAGNOSIS — I69320 Aphasia following cerebral infarction: Secondary | ICD-10-CM | POA: Insufficient documentation

## 2016-12-28 DIAGNOSIS — R9431 Abnormal electrocardiogram [ECG] [EKG]: Secondary | ICD-10-CM | POA: Diagnosis not present

## 2016-12-28 DIAGNOSIS — R531 Weakness: Secondary | ICD-10-CM | POA: Diagnosis not present

## 2016-12-28 DIAGNOSIS — M6281 Muscle weakness (generalized): Secondary | ICD-10-CM | POA: Diagnosis not present

## 2016-12-28 NOTE — Telephone Encounter (Signed)
[facility] called saying Mrs. Owne needs refill on her  HYDROcodone-acetaminophen (NORCO) 7.5-325 MG tablet  Thanks teri

## 2016-12-28 NOTE — Telephone Encounter (Signed)
She has several prescriptions for hydrocodone up front for pickup per Dr. Sherrie MustacheFisher

## 2016-12-28 NOTE — Telephone Encounter (Signed)
This is a patient of Dr. Esmond CamperFishers, can you please review. KW

## 2016-12-28 NOTE — Telephone Encounter (Signed)
Left message for nurse to call back office. KW

## 2016-12-29 DIAGNOSIS — M810 Age-related osteoporosis without current pathological fracture: Secondary | ICD-10-CM | POA: Diagnosis present

## 2016-12-29 DIAGNOSIS — K5900 Constipation, unspecified: Secondary | ICD-10-CM | POA: Diagnosis present

## 2016-12-29 DIAGNOSIS — Z7401 Bed confinement status: Secondary | ICD-10-CM | POA: Diagnosis not present

## 2016-12-29 DIAGNOSIS — I69391 Dysphagia following cerebral infarction: Secondary | ICD-10-CM | POA: Diagnosis not present

## 2016-12-29 DIAGNOSIS — J449 Chronic obstructive pulmonary disease, unspecified: Secondary | ICD-10-CM | POA: Diagnosis not present

## 2016-12-29 DIAGNOSIS — I69354 Hemiplegia and hemiparesis following cerebral infarction affecting left non-dominant side: Secondary | ICD-10-CM | POA: Diagnosis not present

## 2016-12-29 DIAGNOSIS — F339 Major depressive disorder, recurrent, unspecified: Secondary | ICD-10-CM | POA: Diagnosis not present

## 2016-12-29 DIAGNOSIS — Z4682 Encounter for fitting and adjustment of non-vascular catheter: Secondary | ICD-10-CM | POA: Diagnosis not present

## 2016-12-29 DIAGNOSIS — J690 Pneumonitis due to inhalation of food and vomit: Secondary | ICD-10-CM | POA: Diagnosis not present

## 2016-12-29 DIAGNOSIS — B9620 Unspecified Escherichia coli [E. coli] as the cause of diseases classified elsewhere: Secondary | ICD-10-CM | POA: Diagnosis present

## 2016-12-29 DIAGNOSIS — F209 Schizophrenia, unspecified: Secondary | ICD-10-CM | POA: Diagnosis present

## 2016-12-29 DIAGNOSIS — N390 Urinary tract infection, site not specified: Secondary | ICD-10-CM | POA: Diagnosis not present

## 2016-12-29 DIAGNOSIS — R4182 Altered mental status, unspecified: Secondary | ICD-10-CM | POA: Diagnosis not present

## 2016-12-29 DIAGNOSIS — R5381 Other malaise: Secondary | ICD-10-CM | POA: Diagnosis not present

## 2016-12-29 DIAGNOSIS — D649 Anemia, unspecified: Secondary | ICD-10-CM | POA: Diagnosis present

## 2016-12-29 DIAGNOSIS — M5430 Sciatica, unspecified side: Secondary | ICD-10-CM | POA: Diagnosis present

## 2016-12-29 DIAGNOSIS — Z66 Do not resuscitate: Secondary | ICD-10-CM | POA: Diagnosis not present

## 2016-12-29 DIAGNOSIS — E875 Hyperkalemia: Secondary | ICD-10-CM | POA: Diagnosis present

## 2016-12-29 DIAGNOSIS — Z9981 Dependence on supplemental oxygen: Secondary | ICD-10-CM | POA: Diagnosis not present

## 2016-12-29 DIAGNOSIS — F419 Anxiety disorder, unspecified: Secondary | ICD-10-CM | POA: Diagnosis not present

## 2016-12-29 DIAGNOSIS — F17210 Nicotine dependence, cigarettes, uncomplicated: Secondary | ICD-10-CM | POA: Diagnosis present

## 2016-12-29 DIAGNOSIS — R1310 Dysphagia, unspecified: Secondary | ICD-10-CM | POA: Diagnosis not present

## 2016-12-29 DIAGNOSIS — K219 Gastro-esophageal reflux disease without esophagitis: Secondary | ICD-10-CM | POA: Diagnosis not present

## 2016-12-29 DIAGNOSIS — Z7983 Long term (current) use of bisphosphonates: Secondary | ICD-10-CM | POA: Diagnosis not present

## 2016-12-29 DIAGNOSIS — R0600 Dyspnea, unspecified: Secondary | ICD-10-CM | POA: Diagnosis not present

## 2016-12-29 DIAGNOSIS — J90 Pleural effusion, not elsewhere classified: Secondary | ICD-10-CM | POA: Diagnosis not present

## 2016-12-29 DIAGNOSIS — I69320 Aphasia following cerebral infarction: Secondary | ICD-10-CM | POA: Diagnosis not present

## 2016-12-29 DIAGNOSIS — R000 Tachycardia, unspecified: Secondary | ICD-10-CM | POA: Diagnosis not present

## 2016-12-29 DIAGNOSIS — I639 Cerebral infarction, unspecified: Secondary | ICD-10-CM | POA: Diagnosis not present

## 2016-12-29 DIAGNOSIS — I63511 Cerebral infarction due to unspecified occlusion or stenosis of right middle cerebral artery: Secondary | ICD-10-CM | POA: Diagnosis not present

## 2016-12-29 DIAGNOSIS — M6281 Muscle weakness (generalized): Secondary | ICD-10-CM | POA: Diagnosis not present

## 2016-12-29 DIAGNOSIS — Z515 Encounter for palliative care: Secondary | ICD-10-CM | POA: Diagnosis not present

## 2016-12-29 DIAGNOSIS — L89620 Pressure ulcer of left heel, unstageable: Secondary | ICD-10-CM | POA: Diagnosis not present

## 2016-12-29 DIAGNOSIS — F329 Major depressive disorder, single episode, unspecified: Secondary | ICD-10-CM | POA: Diagnosis present

## 2016-12-29 DIAGNOSIS — G8194 Hemiplegia, unspecified affecting left nondominant side: Secondary | ICD-10-CM | POA: Diagnosis present

## 2016-12-29 DIAGNOSIS — R918 Other nonspecific abnormal finding of lung field: Secondary | ICD-10-CM | POA: Diagnosis not present

## 2016-12-29 DIAGNOSIS — R29708 NIHSS score 8: Secondary | ICD-10-CM | POA: Diagnosis present

## 2016-12-29 DIAGNOSIS — M1990 Unspecified osteoarthritis, unspecified site: Secondary | ICD-10-CM | POA: Diagnosis present

## 2016-12-29 DIAGNOSIS — R0989 Other specified symptoms and signs involving the circulatory and respiratory systems: Secondary | ICD-10-CM | POA: Diagnosis not present

## 2016-12-29 DIAGNOSIS — R279 Unspecified lack of coordination: Secondary | ICD-10-CM | POA: Diagnosis not present

## 2016-12-29 DIAGNOSIS — Z431 Encounter for attention to gastrostomy: Secondary | ICD-10-CM | POA: Diagnosis not present

## 2016-12-29 DIAGNOSIS — G8929 Other chronic pain: Secondary | ICD-10-CM | POA: Diagnosis not present

## 2017-01-01 MED ORDER — HYDROCODONE-ACETAMINOPHEN 7.5-325 MG PO TABS: 1.0000 | ORAL_TABLET | Freq: Four times a day (QID) | ORAL | 0 refills | Status: DC | PRN

## 2017-01-14 ENCOUNTER — Telehealth: Payer: Self-pay | Admitting: Family Medicine

## 2017-01-14 NOTE — Telephone Encounter (Signed)
Advised. She will have them fax the papers again.

## 2017-01-14 NOTE — Telephone Encounter (Signed)
I don't have any FMLA papers for this patient.

## 2017-01-14 NOTE — Telephone Encounter (Signed)
Please advise 

## 2017-01-14 NOTE — Telephone Encounter (Signed)
Pt's daughter Nicholos JohnsKathleen said [employer] faxed FMLA papers to us last week.  She wants to check with us to make sure we have recd the form to complete and faxed to back to [employer].  Pt call back number is [phone]  Thanks Barth Kirksteri

## 2017-01-21 ENCOUNTER — Telehealth: Payer: Self-pay | Admitting: Family Medicine

## 2017-01-21 NOTE — Telephone Encounter (Signed)
Noted  

## 2017-01-21 NOTE — Telephone Encounter (Signed)
Pt's daughter called Diana Mccoy back regarding FMLA papers  Barth Kirksteri

## 2017-01-24 ENCOUNTER — Ambulatory Visit: Payer: Medicare Other | Admitting: Family Medicine

## 2017-01-24 ENCOUNTER — Encounter: Payer: Medicare Other | Admitting: Family Medicine

## 2017-02-13 ENCOUNTER — Telehealth: Payer: Self-pay | Admitting: Family Medicine

## 2017-02-13 NOTE — Telephone Encounter (Signed)
He needs copy of records of [facility] in [place]. I don't think we can print them from [facility], [facility] need to [facility].

## 2017-02-13 NOTE — Telephone Encounter (Signed)
Spenser Barry DienesOwens is going [facility].  Dr. Alphonsus Siasletvak wants medical info on this patient faxed to him.    [phone]

## 2017-02-14 DIAGNOSIS — R6889 Other general symptoms and signs: Secondary | ICD-10-CM | POA: Diagnosis not present

## 2017-02-14 DIAGNOSIS — Z7401 Bed confinement status: Secondary | ICD-10-CM | POA: Diagnosis not present

## 2017-02-19 DIAGNOSIS — F39 Unspecified mood [affective] disorder: Secondary | ICD-10-CM | POA: Diagnosis not present

## 2017-02-19 DIAGNOSIS — I69391 Dysphagia following cerebral infarction: Secondary | ICD-10-CM | POA: Diagnosis not present

## 2017-02-19 DIAGNOSIS — J439 Emphysema, unspecified: Secondary | ICD-10-CM | POA: Diagnosis not present

## 2017-02-19 DIAGNOSIS — G8194 Hemiplegia, unspecified affecting left nondominant side: Secondary | ICD-10-CM | POA: Diagnosis not present

## 2017-02-19 DIAGNOSIS — F200 Paranoid schizophrenia: Secondary | ICD-10-CM | POA: Diagnosis not present

## 2017-02-20 NOTE — Telephone Encounter (Signed)
I Valentina Shaggy on 02-20-17 @ [place] [phone]) to let her know to call [facility] to request records from them on patient. Thanks CC

## 2017-03-05 DIAGNOSIS — L89610 Pressure ulcer of right heel, unstageable: Secondary | ICD-10-CM | POA: Diagnosis not present

## 2017-03-06 ENCOUNTER — Emergency Department

## 2017-03-06 ENCOUNTER — Inpatient Hospital Stay
Admission: EM | Admit: 2017-03-06 | Discharge: 2017-03-11 | DRG: 871 | Disposition: A | Discharge status: Skilled Nursing Facility | Attending: Internal Medicine | Admitting: Internal Medicine

## 2017-03-06 DIAGNOSIS — M1990 Unspecified osteoarthritis, unspecified site: Secondary | ICD-10-CM | POA: Diagnosis present

## 2017-03-06 DIAGNOSIS — M419 Scoliosis, unspecified: Secondary | ICD-10-CM | POA: Diagnosis present

## 2017-03-06 DIAGNOSIS — R1310 Dysphagia, unspecified: Secondary | ICD-10-CM | POA: Diagnosis present

## 2017-03-06 DIAGNOSIS — A419 Sepsis, unspecified organism: Secondary | ICD-10-CM | POA: Diagnosis not present

## 2017-03-06 DIAGNOSIS — R0603 Acute respiratory distress: Secondary | ICD-10-CM

## 2017-03-06 DIAGNOSIS — D72829 Elevated white blood cell count, unspecified: Secondary | ICD-10-CM | POA: Diagnosis not present

## 2017-03-06 DIAGNOSIS — F17210 Nicotine dependence, cigarettes, uncomplicated: Secondary | ICD-10-CM | POA: Diagnosis present

## 2017-03-06 DIAGNOSIS — E876 Hypokalemia: Secondary | ICD-10-CM | POA: Diagnosis present

## 2017-03-06 DIAGNOSIS — E872 Acidosis: Secondary | ICD-10-CM | POA: Diagnosis present

## 2017-03-06 DIAGNOSIS — R740 Nonspecific elevation of levels of transaminase and lactic acid dehydrogenase [LDH]: Secondary | ICD-10-CM | POA: Diagnosis present

## 2017-03-06 DIAGNOSIS — R569 Unspecified convulsions: Secondary | ICD-10-CM | POA: Diagnosis present

## 2017-03-06 DIAGNOSIS — I69351 Hemiplegia and hemiparesis following cerebral infarction affecting right dominant side: Secondary | ICD-10-CM

## 2017-03-06 DIAGNOSIS — Z515 Encounter for palliative care: Secondary | ICD-10-CM | POA: Diagnosis not present

## 2017-03-06 DIAGNOSIS — J441 Chronic obstructive pulmonary disease with (acute) exacerbation: Secondary | ICD-10-CM | POA: Diagnosis present

## 2017-03-06 DIAGNOSIS — I10 Essential (primary) hypertension: Secondary | ICD-10-CM | POA: Diagnosis present

## 2017-03-06 DIAGNOSIS — Z79899 Other long term (current) drug therapy: Secondary | ICD-10-CM

## 2017-03-06 DIAGNOSIS — R0902 Hypoxemia: Secondary | ICD-10-CM

## 2017-03-06 DIAGNOSIS — R7989 Other specified abnormal findings of blood chemistry: Secondary | ICD-10-CM

## 2017-03-06 DIAGNOSIS — Z881 Allergy status to other antibiotic agents status: Secondary | ICD-10-CM

## 2017-03-06 DIAGNOSIS — F419 Anxiety disorder, unspecified: Secondary | ICD-10-CM | POA: Diagnosis present

## 2017-03-06 DIAGNOSIS — R0600 Dyspnea, unspecified: Secondary | ICD-10-CM | POA: Diagnosis not present

## 2017-03-06 DIAGNOSIS — L8990 Pressure ulcer of unspecified site, unspecified stage: Secondary | ICD-10-CM | POA: Diagnosis present

## 2017-03-06 DIAGNOSIS — Z66 Do not resuscitate: Secondary | ICD-10-CM | POA: Diagnosis present

## 2017-03-06 DIAGNOSIS — F319 Bipolar disorder, unspecified: Secondary | ICD-10-CM | POA: Diagnosis present

## 2017-03-06 DIAGNOSIS — R4020 Unspecified coma: Secondary | ICD-10-CM | POA: Diagnosis not present

## 2017-03-06 DIAGNOSIS — Z882 Allergy status to sulfonamides status: Secondary | ICD-10-CM

## 2017-03-06 DIAGNOSIS — R6521 Severe sepsis with septic shock: Secondary | ICD-10-CM | POA: Diagnosis present

## 2017-03-06 DIAGNOSIS — I69398 Other sequelae of cerebral infarction: Secondary | ICD-10-CM

## 2017-03-06 DIAGNOSIS — R4701 Aphasia: Secondary | ICD-10-CM | POA: Diagnosis present

## 2017-03-06 DIAGNOSIS — F209 Schizophrenia, unspecified: Secondary | ICD-10-CM | POA: Diagnosis present

## 2017-03-06 DIAGNOSIS — M4800 Spinal stenosis, site unspecified: Secondary | ICD-10-CM | POA: Diagnosis present

## 2017-03-06 DIAGNOSIS — I69391 Dysphagia following cerebral infarction: Secondary | ICD-10-CM

## 2017-03-06 DIAGNOSIS — K219 Gastro-esophageal reflux disease without esophagitis: Secondary | ICD-10-CM | POA: Diagnosis present

## 2017-03-06 DIAGNOSIS — Z888 Allergy status to other drugs, medicaments and biological substances status: Secondary | ICD-10-CM

## 2017-03-06 DIAGNOSIS — E870 Hyperosmolality and hypernatremia: Secondary | ICD-10-CM | POA: Diagnosis present

## 2017-03-06 DIAGNOSIS — J9621 Acute and chronic respiratory failure with hypoxia: Secondary | ICD-10-CM | POA: Diagnosis present

## 2017-03-06 DIAGNOSIS — Z7982 Long term (current) use of aspirin: Secondary | ICD-10-CM

## 2017-03-06 DIAGNOSIS — Z880 Allergy status to penicillin: Secondary | ICD-10-CM

## 2017-03-06 LAB — BLOOD GAS, ARTERIAL
Acid-Base Excess: 3.9 mmol/L — ABNORMAL HIGH (ref 0.0–2.0)
Bicarbonate: 31.3 mmol/L — ABNORMAL HIGH (ref 20.0–28.0)
DELIVERY SYSTEMS: POSITIVE
EXPIRATORY PAP: 5
FIO2: 1
Inspiratory PAP: 15
LHR: 12 {breaths}/min
O2 SAT: 99.7 %
PATIENT TEMPERATURE: 37
PH ART: 7.34 — AB (ref 7.350–7.450)
pCO2 arterial: 58 mmHg — ABNORMAL HIGH (ref 32.0–48.0)
pO2, Arterial: 207 mmHg — ABNORMAL HIGH (ref 83.0–108.0)

## 2017-03-06 LAB — COMPREHENSIVE METABOLIC PANEL
ALK PHOS: 99 U/L (ref 38–126)
ALT: 16 U/L (ref 14–54)
ANION GAP: 11 (ref 5–15)
AST: 24 U/L (ref 15–41)
Albumin: 3.1 g/dL — ABNORMAL LOW (ref 3.5–5.0)
BUN: 24 mg/dL — ABNORMAL HIGH (ref 6–20)
CALCIUM: 9.1 mg/dL (ref 8.9–10.3)
CHLORIDE: 118 mmol/L — AB (ref 101–111)
CO2: 29 mmol/L (ref 22–32)
CREATININE: 0.74 mg/dL (ref 0.44–1.00)
Glucose, Bld: 208 mg/dL — ABNORMAL HIGH (ref 65–99)
Potassium: 3.1 mmol/L — ABNORMAL LOW (ref 3.5–5.1)
SODIUM: 158 mmol/L — AB (ref 135–145)
Total Bilirubin: 0.8 mg/dL (ref 0.3–1.2)
Total Protein: 6.1 g/dL — ABNORMAL LOW (ref 6.5–8.1)

## 2017-03-06 LAB — LACTIC ACID, PLASMA: Lactic Acid, Venous: 2.6 mmol/L (ref 0.5–1.9)

## 2017-03-06 MED ORDER — VANCOMYCIN HCL IN DEXTROSE 1-5 GM/200ML-% IV SOLN
1000.0000 mg | Freq: Once | INTRAVENOUS | Status: AC
  Administered 2017-03-07: 1000 mg via INTRAVENOUS
  Filled 2017-03-06: qty 200

## 2017-03-06 MED ORDER — CEFEPIME HCL 2 G IJ SOLR
2.0000 g | Freq: Once | INTRAMUSCULAR | Status: AC
  Administered 2017-03-06: 2 g via INTRAVENOUS
  Filled 2017-03-06: qty 2

## 2017-03-06 MED ORDER — SODIUM CHLORIDE 0.9 % IV BOLUS (SEPSIS): 1000.0000 mL | Freq: Once | INTRAVENOUS | Status: DC

## 2017-03-06 MED ORDER — SODIUM CHLORIDE 0.9 % IV SOLN
Freq: Once | INTRAVENOUS | Status: AC
  Administered 2017-03-06: 23:00:00 via INTRAVENOUS

## 2017-03-06 MED ORDER — SODIUM CHLORIDE 0.9 % IV BOLUS (SEPSIS)
1000.0000 mL | Freq: Once | INTRAVENOUS | Status: AC
  Administered 2017-03-06: 1000 mL via INTRAVENOUS

## 2017-03-06 MED ORDER — SODIUM CHLORIDE 0.9 % IV BOLUS (SEPSIS): 250.0000 mL | Freq: Once | INTRAVENOUS | Status: DC

## 2017-03-06 NOTE — ED Notes (Signed)
Lab called with a critical high lactic of 2.6 Dr. Derrill KayGoodman was notified

## 2017-03-06 NOTE — ED Notes (Signed)
ABG drawn by RT

## 2017-03-06 NOTE — ED Notes (Signed)
Pt is on Bi-pap and sat'ing at 100% - Dr Derrill KayGoodman talked to daughter and she states pt is DNR/DNI - pt is unresponsive

## 2017-03-06 NOTE — ED Triage Notes (Addendum)
[facility] - pt was c/o shortness of breath and then got a breathing tx at the nursing home - ems arrived o2 sat 78% and pt became more short of breath - she was placed on cpap and o2 sat 89% - pt then became unresponsive with frothy sputum - she was suctioned and then ems started bagging the pt - she arrived in ED being bagged and RT and MD Derrill KayGoodman at bedside - pt placed on bipap by RT

## 2017-03-06 NOTE — ED Provider Notes (Addendum)
[facility] Emergency Department Provider Note   ____________________________________________   I have reviewed the triage vital signs and the nursing notes.   HISTORY  Chief Complaint Respiratory Distress   History limited by: Altered Mental Status   HPI Diana Mccoy is a 71 y.o. female who presents to the emergency department today because of concerns of respiratory distress. Patient came in via EMS. EMS states that staff at her living facility told them that she just started having respiratory distress tonight. She was given a breathing treatment at her living facility. She does have a history of COPD. Patient was hypoxic for EMS when they first arrived. She was hypoxic. They were able to BVM the patient to the high nineties and did feel that she became slightly more responsive. The patient was not able to give any history.    Past Medical History:  Diagnosis Date  . Acid reflux 12/28/2014  . 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 Anxiety 12/28/2014  . BP (high blood pressure) 12/28/2014  . CAFL (chronic airflow limitation) (HCC) 12/28/2014  . COPD (chronic obstructive pulmonary disease) (HCC) 01/19/2016  . Depression 03/28/2015  . 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 Disorder of kidney 12/28/2014  . Gait instability 04/18/2015  . Osteoarthritis 03/28/2015  . 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 Schizophrenia (HCC) 03/28/2015  . Scoliosis 12/28/2014  . Spinal stenosis 12/28/2014    Patient Active Problem List   Diagnosis Date Noted  . 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 COPD (chronic obstructive pulmonary disease) (HCC) 01/19/2016  . Gait instability 04/18/2015  . Schizophrenia (HCC) 03/28/2015  . Depression 03/28/2015  . Osteoarthritis 03/28/2015  . Elevated WBC count 01/31/2015  . Anxiety 12/28/2014  . 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 CAFL (chronic airflow limitation) (HCC) 12/28/2014  . 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 Acid reflux 12/28/2014  . BP (high blood pressure) 12/28/2014  . Disorder of kidney 12/28/2014  . Scoliosis 12/28/2014  . Spinal stenosis 12/28/2014    Past Surgical History:  Procedure Laterality Date  . HIP FRACTURE SURGERY Left     Prior to Admission medications   Medication Sig Start Date End Date Taking? Authorizing Provider  acetaminophen (TYLENOL) 325 MG tablet Take 650 mg by mouth.   Yes [provider]  aspirin EC 81 MG tablet Take 81 mg by mouth daily.   Yes [provider]  bisacodyl (DULCOLAX) 10 MG suppository Place 10 mg rectally daily as needed for moderate constipation.   Yes [provider]  fentaNYL (DURAGESIC - DOSED MCG/HR) 12 MCG/HR Place 12.5 mcg onto the skin every 3 (three) days.   Yes [provider]  hyoscyamine (LEVSIN SL) 0.125 MG SL tablet Place 0.125-0.25 mg under the tongue every 4 (four) hours as needed.   Yes [provider]  ipratropium-albuterol (DUONEB) 0.5-2.5 (3) MG/3ML SOLN Take 3 mLs by nebulization every 4 (four) hours as needed.   Yes [provider]  lidocaine (LIDODERM) 5 % Place 1 patch onto the skin daily. Remove & Discard patch within 12 hours or as directed by MD   Yes [provider]  LORazepam (ATIVAN) 0.5 MG tablet Take 0.5 mg by mouth daily.   Yes [provider]  morphine 20 MG/5ML solution Take 5-10 mg by mouth every hour as needed for pain.   Yes [provider]  ondansetron (ZOFRAN-ODT) 8 MG disintegrating tablet Take 8 mg by mouth every 8 (eight) hours as needed for nausea  or vomiting.   Yes [provider]  sertraline (ZOLOFT) 50 MG tablet Take 50 mg by mouth daily.   Yes [provider]  trolamine salicylate (ASPERCREME) 10 % cream Apply 1 application topically as needed for muscle pain.   Yes [provider]    Allergies Ciprofloxacin; Penicillins; Sodium thiosulfate; and Sulfa antibiotics  Family History  Problem  Relation Age of Onset  . Gout Father     Social History Social History  Substance Use Topics  . Smoking status: Current Every Day Smoker    Packs/day: 1.00    Years: 30.00    Types: Cigarettes  . Smokeless tobacco: Never Used  . Alcohol use No    Review of Systems Unable to obtain given altered mental status.   ____________________________________________   PHYSICAL EXAM:  VITAL SIGNS: ED Triage Vitals  Enc Vitals Group     BP 03/06/17 2230 (!) 87/68     Pulse Rate 03/06/17 2230 (!) 159     Resp 03/06/17 2230 (!) 35     Temp 03/06/17 2235 (!) 100.7 F (38.2 C)     Temp Source 03/06/17 2235 Rectal     SpO2 03/06/17 2228 96 %     Weight 03/06/17 2234 160 lb (72.6 kg)     Height 03/06/17 2234 5\' 6"  (1.676 m)   Constitutional: Minimally responsive to painful stimuli. Severe respiratory distress.  Eyes: Conjunctivae are normal.  ENT   Head: Normocephalic and atraumatic.   Nose: No congestion/rhinnorhea.   Mouth/Throat: Mucous membranes are moist.   Neck: No stridor. Hematological/Lymphatic/Immunilogical: No cervical lymphadenopathy. Cardiovascular: Tachycardic, regular rhythm.  No murmurs, rubs, or gallops.  Respiratory: Tachypnea. Severe respiratory distress. Gastrointestinal: Soft and non tender. No rebound. No guarding.  Genitourinary: Deferred Musculoskeletal: Normal range of motion in all extremities. No lower extremity edema. Neurologic: Minimally responsive to painful stimuli Skin:  Skin is warm, dry and intact. No rash noted.   ____________________________________________    LABS (pertinent positives/negatives)  Labs Reviewed  BLOOD GAS, ARTERIAL - Abnormal; Notable for the following:       Result Value   pH, Arterial 7.34 (*)    pCO2 arterial 58 (*)    pO2, Arterial 207 (*)    Bicarbonate 31.3 (*)    Acid-Base Excess 3.9 (*)    All other components within normal limits  CBC WITH DIFFERENTIAL/PLATELET - Abnormal; Notable for the  following:    WBC 24.8 (*)    MCV 102.5 (*)    All other components within normal limits  COMPREHENSIVE METABOLIC PANEL - Abnormal; Notable for the following:    Sodium 158 (*)    Potassium 3.1 (*)    Chloride 118 (*)    Glucose, Bld 208 (*)    BUN 24 (*)    Total Protein 6.1 (*)    Albumin 3.1 (*)    All other components within normal limits  LACTIC ACID, PLASMA - Abnormal; Notable for the following:    Lactic Acid, Venous 2.6 (*)    All other components within normal limits  CULTURE, BLOOD (ROUTINE X 2)  CULTURE, BLOOD (ROUTINE X 2)  LACTIC ACID, PLASMA  URINALYSIS, ROUTINE W REFLEX MICROSCOPIC     ____________________________________________   EKG  I, Phineas SemenGraydon Jaret Coppedge, attending physician, personally viewed and interpreted this EKG  EKG Time: 2226 Rate: 163 Rhythm: sinus tachycardia Axis: normal Intervals: qtc 569 QRS: narrow ST changes: no st elevation Impression: abnormal ekg ____________________________________________    RADIOLOGY  CXR  IMPRESSION:  No active disease.   ____________________________________________   PROCEDURES  Procedures  CRITICAL CARE Performed by: Phineas Semen   Total critical care time: 40 minutes  Critical care time was exclusive of separately billable procedures and treating other patients.  Critical care was necessary to treat or prevent imminent or life-threatening deterioration.  Critical care was time spent personally by me on the following activities: development of treatment plan with patient and/or surrogate as well as nursing, discussions with consultants, evaluation of patient's response to treatment, examination of patient, obtaining history from patient or surrogate, ordering and performing treatments and interventions, ordering and review of laboratory studies, ordering and review of radiographic studies, pulse oximetry and re-evaluation of patient's  condition.  ____________________________________________   INITIAL IMPRESSION / ASSESSMENT AND PLAN / ED COURSE  Pertinent labs & imaging results that were available during my care of the patient were reviewed by me and considered in my medical decision making (see chart for details).  Patient presented to the emergency department today via EMS as a emergency traffic. Patient initially was only minimally responsive to painful stimuli and was in obvious severe respiratory distress. In addition she was severely tachycardic. She was found to be febrile.Patient was called a code sepsis. Patient was switched to BiPAP shortly after arrival to the emergency department. I did have a discussion with the daughter and medical decision maker [place] dictated patient is a DO NOT RESUSCITATE and DO NOT INTUBATE. This does correlate with the notes from the latest hospitalization at [facility]. Patient heart rate did improve with IV fluids. She was given multiple broad spectrum IV antibiotics. Patient will be admitted to the hospitalist service.   ____________________________________________   FINAL CLINICAL IMPRESSION(S) / ED DIAGNOSES  Final diagnoses:  Respiratory distress  Sepsis, due to unspecified organism (HCC)  Elevated lactic acid level  Leukocytosis, unspecified type     Note: This dictation was prepared with Dragon dictation. Any transcriptional errors that result from this process are unintentional     Phineas Semen, MD 03/06/17 2349    Phineas Semen, MD 03/07/17 [phone]

## 2017-03-07 DIAGNOSIS — R1310 Dysphagia, unspecified: Secondary | ICD-10-CM | POA: Diagnosis not present

## 2017-03-07 DIAGNOSIS — M419 Scoliosis, unspecified: Secondary | ICD-10-CM | POA: Diagnosis present

## 2017-03-07 DIAGNOSIS — R740 Nonspecific elevation of levels of transaminase and lactic acid dehydrogenase [LDH]: Secondary | ICD-10-CM | POA: Diagnosis present

## 2017-03-07 DIAGNOSIS — F209 Schizophrenia, unspecified: Secondary | ICD-10-CM | POA: Diagnosis not present

## 2017-03-07 DIAGNOSIS — F329 Major depressive disorder, single episode, unspecified: Secondary | ICD-10-CM | POA: Diagnosis not present

## 2017-03-07 DIAGNOSIS — A499 Bacterial infection, unspecified: Secondary | ICD-10-CM | POA: Diagnosis not present

## 2017-03-07 DIAGNOSIS — M1990 Unspecified osteoarthritis, unspecified site: Secondary | ICD-10-CM | POA: Diagnosis present

## 2017-03-07 DIAGNOSIS — R4701 Aphasia: Secondary | ICD-10-CM | POA: Diagnosis present

## 2017-03-07 DIAGNOSIS — R6521 Severe sepsis with septic shock: Secondary | ICD-10-CM

## 2017-03-07 DIAGNOSIS — Z7401 Bed confinement status: Secondary | ICD-10-CM | POA: Diagnosis not present

## 2017-03-07 DIAGNOSIS — Z515 Encounter for palliative care: Secondary | ICD-10-CM | POA: Diagnosis not present

## 2017-03-07 DIAGNOSIS — R569 Unspecified convulsions: Secondary | ICD-10-CM | POA: Diagnosis present

## 2017-03-07 DIAGNOSIS — A419 Sepsis, unspecified organism: Secondary | ICD-10-CM | POA: Diagnosis present

## 2017-03-07 DIAGNOSIS — F17210 Nicotine dependence, cigarettes, uncomplicated: Secondary | ICD-10-CM | POA: Diagnosis present

## 2017-03-07 DIAGNOSIS — I10 Essential (primary) hypertension: Secondary | ICD-10-CM | POA: Diagnosis present

## 2017-03-07 DIAGNOSIS — J9601 Acute respiratory failure with hypoxia: Secondary | ICD-10-CM | POA: Diagnosis not present

## 2017-03-07 DIAGNOSIS — E870 Hyperosmolality and hypernatremia: Secondary | ICD-10-CM | POA: Diagnosis present

## 2017-03-07 DIAGNOSIS — I69391 Dysphagia following cerebral infarction: Secondary | ICD-10-CM | POA: Diagnosis not present

## 2017-03-07 DIAGNOSIS — E872 Acidosis: Secondary | ICD-10-CM | POA: Diagnosis present

## 2017-03-07 DIAGNOSIS — Z881 Allergy status to other antibiotic agents status: Secondary | ICD-10-CM | POA: Diagnosis not present

## 2017-03-07 DIAGNOSIS — J449 Chronic obstructive pulmonary disease, unspecified: Secondary | ICD-10-CM | POA: Diagnosis not present

## 2017-03-07 DIAGNOSIS — F319 Bipolar disorder, unspecified: Secondary | ICD-10-CM | POA: Diagnosis present

## 2017-03-07 DIAGNOSIS — K219 Gastro-esophageal reflux disease without esophagitis: Secondary | ICD-10-CM | POA: Diagnosis present

## 2017-03-07 DIAGNOSIS — J441 Chronic obstructive pulmonary disease with (acute) exacerbation: Secondary | ICD-10-CM

## 2017-03-07 DIAGNOSIS — I69354 Hemiplegia and hemiparesis following cerebral infarction affecting left non-dominant side: Secondary | ICD-10-CM | POA: Diagnosis not present

## 2017-03-07 DIAGNOSIS — F419 Anxiety disorder, unspecified: Secondary | ICD-10-CM | POA: Diagnosis not present

## 2017-03-07 DIAGNOSIS — R509 Fever, unspecified: Secondary | ICD-10-CM | POA: Diagnosis not present

## 2017-03-07 DIAGNOSIS — L8990 Pressure ulcer of unspecified site, unspecified stage: Secondary | ICD-10-CM | POA: Diagnosis present

## 2017-03-07 DIAGNOSIS — M4800 Spinal stenosis, site unspecified: Secondary | ICD-10-CM | POA: Diagnosis present

## 2017-03-07 DIAGNOSIS — R0603 Acute respiratory distress: Secondary | ICD-10-CM | POA: Diagnosis not present

## 2017-03-07 DIAGNOSIS — E876 Hypokalemia: Secondary | ICD-10-CM | POA: Diagnosis present

## 2017-03-07 DIAGNOSIS — R4182 Altered mental status, unspecified: Secondary | ICD-10-CM | POA: Diagnosis not present

## 2017-03-07 DIAGNOSIS — Z66 Do not resuscitate: Secondary | ICD-10-CM | POA: Diagnosis present

## 2017-03-07 DIAGNOSIS — J9621 Acute and chronic respiratory failure with hypoxia: Secondary | ICD-10-CM | POA: Diagnosis present

## 2017-03-07 LAB — CBC WITH DIFFERENTIAL/PLATELET
BASOS PCT: 1 %
Basophils Absolute: 0.2 10*3/uL — ABNORMAL HIGH (ref 0–0.1)
Eosinophils Absolute: 0.2 10*3/uL (ref 0–0.7)
Eosinophils Relative: 1 %
HCT: 43.2 % (ref 35.0–47.0)
Hemoglobin: 13.8 g/dL (ref 12.0–16.0)
LYMPHS ABS: 6.2 10*3/uL — AB (ref 1.0–3.6)
Lymphocytes Relative: 25 %
MCH: 32.9 pg (ref 26.0–34.0)
MCHC: 32.1 g/dL (ref 32.0–36.0)
MCV: 102.5 fL — AB (ref 80.0–100.0)
MONOS PCT: 9 %
Monocytes Absolute: 2.2 10*3/uL — ABNORMAL HIGH (ref 0.2–0.9)
NEUTROS ABS: 16 10*3/uL — AB (ref 1.4–6.5)
Neutrophils Relative %: 64 %
PLATELETS: 327 10*3/uL (ref 150–440)
RBC: 4.21 MIL/uL (ref 3.80–5.20)
RDW: 14.4 % (ref 11.5–14.5)
WBC: 24.8 10*3/uL — ABNORMAL HIGH (ref 3.6–11.0)

## 2017-03-07 LAB — MRSA PCR SCREENING: MRSA by PCR: NEGATIVE

## 2017-03-07 LAB — BASIC METABOLIC PANEL
ANION GAP: 7 (ref 5–15)
BUN: 19 mg/dL (ref 6–20)
CO2: 26 mmol/L (ref 22–32)
Calcium: 8.1 mg/dL — ABNORMAL LOW (ref 8.9–10.3)
Chloride: 120 mmol/L — ABNORMAL HIGH (ref 101–111)
Creatinine, Ser: 0.56 mg/dL (ref 0.44–1.00)
GFR calc Af Amer: >60 mL/min (ref >60)
GLUCOSE: 181 mg/dL — AB (ref 65–99)
POTASSIUM: 3.7 mmol/L (ref 3.5–5.1)
SODIUM: 153 mmol/L — AB (ref 135–145)

## 2017-03-07 LAB — HEMOGLOBIN A1C
Hgb A1c MFr Bld: 5.7 % — ABNORMAL HIGH (ref 4.8–5.6)
Mean Plasma Glucose: 116.89 mg/dL

## 2017-03-07 LAB — PROCALCITONIN

## 2017-03-07 LAB — MAGNESIUM: MAGNESIUM: 1.7 mg/dL (ref 1.7–2.4)

## 2017-03-07 LAB — GLUCOSE, CAPILLARY: Glucose-Capillary: 163 mg/dL — ABNORMAL HIGH (ref 65–99)

## 2017-03-07 LAB — LACTIC ACID, PLASMA: Lactic Acid, Venous: 2 mmol/L (ref 0.5–1.9)

## 2017-03-07 LAB — TSH: TSH: 1.191 u[IU]/mL (ref 0.350–4.500)

## 2017-03-07 LAB — PHOSPHORUS: PHOSPHORUS: 2.1 mg/dL — AB (ref 2.5–4.6)

## 2017-03-07 MED ORDER — MAGNESIUM SULFATE 2 GM/50ML IV SOLN
2.0000 g | Freq: Once | INTRAVENOUS | Status: AC
  Administered 2017-03-07: 2 g via INTRAVENOUS
  Filled 2017-03-07: qty 50

## 2017-03-07 MED ORDER — POTASSIUM & SODIUM PHOSPHATES 280-160-250 MG PO PACK
1.0000 | PACK | Freq: Three times a day (TID) | ORAL | Status: AC
  Administered 2017-03-07: 1 via ORAL
  Filled 2017-03-07 (×2): qty 1

## 2017-03-07 MED ORDER — DEXTROSE 5 % IV SOLN
INTRAVENOUS | Status: DC
  Administered 2017-03-07: 14:00:00 via INTRAVENOUS

## 2017-03-07 MED ORDER — ACETAMINOPHEN 325 MG PO TABS
650.0000 mg | ORAL_TABLET | Freq: Four times a day (QID) | ORAL | Status: DC | PRN
  Administered 2017-03-10: 650 mg via ORAL
  Filled 2017-03-07: qty 2

## 2017-03-07 MED ORDER — POTASSIUM CL IN DEXTROSE 5% 20 MEQ/L IV SOLN
20.0000 meq | INTRAVENOUS | Status: DC
  Filled 2017-03-07 (×2): qty 1000

## 2017-03-07 MED ORDER — ONDANSETRON HCL 4 MG PO TABS: 4.0000 mg | ORAL_TABLET | Freq: Four times a day (QID) | ORAL | Status: DC | PRN

## 2017-03-07 MED ORDER — IPRATROPIUM-ALBUTEROL 0.5-2.5 (3) MG/3ML IN SOLN
3.0000 mL | RESPIRATORY_TRACT | Status: DC
  Administered 2017-03-07 – 2017-03-08 (×8): 3 mL via RESPIRATORY_TRACT
  Filled 2017-03-07 (×7): qty 3

## 2017-03-07 MED ORDER — METHYLPREDNISOLONE SODIUM SUCC 125 MG IJ SOLR
60.0000 mg | Freq: Four times a day (QID) | INTRAMUSCULAR | Status: DC
  Administered 2017-03-07 (×2): 60 mg via INTRAVENOUS
  Filled 2017-03-07 (×2): qty 2

## 2017-03-07 MED ORDER — ENOXAPARIN SODIUM 40 MG/0.4ML ~~LOC~~ SOLN
40.0000 mg | SUBCUTANEOUS | Status: DC
  Administered 2017-03-07 – 2017-03-10 (×4): 40 mg via SUBCUTANEOUS
  Filled 2017-03-07 (×4): qty 0.4

## 2017-03-07 MED ORDER — LORAZEPAM 0.5 MG PO TABS
0.5000 mg | ORAL_TABLET | Freq: Every day | ORAL | Status: DC
  Administered 2017-03-08 – 2017-03-11 (×4): 0.5 mg via ORAL
  Filled 2017-03-07 (×4): qty 1

## 2017-03-07 MED ORDER — VANCOMYCIN HCL IN DEXTROSE 1-5 GM/200ML-% IV SOLN
1000.0000 mg | INTRAVENOUS | Status: DC
  Administered 2017-03-07: 1000 mg via INTRAVENOUS
  Filled 2017-03-07: qty 200

## 2017-03-07 MED ORDER — ASPIRIN EC 81 MG PO TBEC
81.0000 mg | DELAYED_RELEASE_TABLET | Freq: Every day | ORAL | Status: DC
  Administered 2017-03-07 – 2017-03-11 (×5): 81 mg via ORAL
  Filled 2017-03-07 (×5): qty 1

## 2017-03-07 MED ORDER — IPRATROPIUM-ALBUTEROL 0.5-2.5 (3) MG/3ML IN SOLN
RESPIRATORY_TRACT | Status: AC
  Filled 2017-03-07: qty 3

## 2017-03-07 MED ORDER — ACETAMINOPHEN 650 MG RE SUPP: 650.0000 mg | Freq: Four times a day (QID) | RECTAL | Status: DC | PRN

## 2017-03-07 MED ORDER — ONDANSETRON HCL 4 MG/2ML IJ SOLN: 4.0000 mg | Freq: Four times a day (QID) | INTRAMUSCULAR | Status: DC | PRN

## 2017-03-07 MED ORDER — DEXTROSE 5 % IV SOLN
2.0000 g | Freq: Two times a day (BID) | INTRAVENOUS | Status: DC
  Filled 2017-03-07 (×2): qty 2

## 2017-03-07 MED ORDER — BISACODYL 10 MG RE SUPP: 10.0000 mg | Freq: Every day | RECTAL | Status: DC | PRN

## 2017-03-07 MED ORDER — DOCUSATE SODIUM 100 MG PO CAPS
100.0000 mg | ORAL_CAPSULE | Freq: Two times a day (BID) | ORAL | Status: DC
  Administered 2017-03-07 – 2017-03-11 (×7): 100 mg via ORAL
  Filled 2017-03-07 (×7): qty 1

## 2017-03-07 MED ORDER — FENTANYL 12 MCG/HR TD PT72
12.5000 ug | MEDICATED_PATCH | TRANSDERMAL | Status: DC
  Administered 2017-03-10: 12.5 ug via TRANSDERMAL
  Filled 2017-03-07: qty 1

## 2017-03-07 MED ORDER — METHYLPREDNISOLONE SODIUM SUCC 40 MG IJ SOLR
20.0000 mg | Freq: Every day | INTRAMUSCULAR | Status: DC
  Administered 2017-03-08 – 2017-03-09 (×2): 20 mg via INTRAVENOUS
  Filled 2017-03-07 (×2): qty 1

## 2017-03-07 MED ORDER — SERTRALINE HCL 50 MG PO TABS
50.0000 mg | ORAL_TABLET | Freq: Every day | ORAL | Status: DC
  Administered 2017-03-07 – 2017-03-11 (×5): 50 mg via ORAL
  Filled 2017-03-07 (×5): qty 1

## 2017-03-07 MED ORDER — LACTATED RINGERS IV SOLN
INTRAVENOUS | Status: DC
  Administered 2017-03-07: 03:00:00 via INTRAVENOUS

## 2017-03-07 MED ORDER — POTASSIUM CHLORIDE 10 MEQ/100ML IV SOLN
10.0000 meq | INTRAVENOUS | Status: AC
  Administered 2017-03-07 (×4): 10 meq via INTRAVENOUS
  Filled 2017-03-07 (×4): qty 100

## 2017-03-07 NOTE — Progress Notes (Signed)
Notified Bincy, NP of K 3.1.

## 2017-03-07 NOTE — Progress Notes (Addendum)
Pharmacy Antibiotic Note  Diana Mccoy MorasOwen is a 71 y.o. female admitted on 03/06/2017 with sepsis.  Pharmacy has been consulted for vancomycin and cefepime dosing.  Plan: DW 62 kg   Vd 43L kei 0.053 hr-1  T1/2 13 hours. Vancomycin 1 gram q 18 hours ordered with stacked dosing. Level before 5th dose. Goal trough 15-20  Cefepime 2 grams q 12 hours ordered   Height: 5\' 6"  (167.6 cm) Weight: 137 lb (62.1 kg) IBW/kg (Calculated) : 59.3  Temp (24hrs), Avg:100.7 F (38.2 C), Min:100.7 F (38.2 C), Max:100.7 F (38.2 C)   Recent Labs Lab 03/06/17 2235  WBC 24.8*  CREATININE 0.74  LATICACIDVEN 2.6*    Estimated Creatinine Clearance: 58.6 mL/min (by C-G formula based on SCr of 0.74 mg/dL).    Allergies  Allergen Reactions  . Ciprofloxacin Nausea And Vomiting  . Penicillins     rash, hives  . Sodium Thiosulfate   . Sulfa Antibiotics     Antimicrobials this admission: Vancomycin 9/6, cefepime 9/5  >>    >>   Dose adjustments this admission:   Microbiology results: 9/5 BCx: pending 9/6 MRSA PCR: pending      9/5 UA: pending 9/5 CXR: no active disease Thank you for allowing pharmacy to be a part of this patient's care.  Euell Schiff S 03/07/2017 1:02 AM

## 2017-03-07 NOTE — Care Management (Signed)
Notified by Clydie BraunKaren with [facility] that [facility] is [facility].

## 2017-03-07 NOTE — Progress Notes (Signed)
Pt alert and nonverbal.  SpO2 100% on 100% BI-PAP.  Bincy, NP at bedside.  Converted patient to nasal cannula 2L.  Tolerating well.  Maintaining O2 saturation of 98%.   Blake DivineShauna, RN

## 2017-03-07 NOTE — Progress Notes (Signed)
Pt transferred to room 150.  Report called to RN receiving pt in room 150.  Have spoken with pt's daughter and updated her on pt's status and transfer.  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  NSR, lungs clear on Room air.  Vitals signs stable, afebrile.

## 2017-03-07 NOTE — Progress Notes (Signed)
MEDICATION RELATED CONSULT NOTE - FOLLOW UP   Pharmacy Consult for Electrolyte Monitoring Indication: hypernatremia, hypokalemia, hypomagnesemia   Allergies  Allergen Reactions  . Ciprofloxacin Nausea And Vomiting  . Penicillins     rash, hives  . Sodium Thiosulfate   . Sulfa Antibiotics     Patient Measurements: Height: 5\' 5"  (165.1 cm) Weight: 140 lb 10.5 oz (63.8 kg) IBW/kg (Calculated) : 57  Vital Signs: Temp: 98.5 F (36.9 C) (09/06 1200) Temp Source: Axillary (09/06 1200) BP: 125/49 (09/06 1100) Pulse Rate: 91 (09/06 1100) Intake/Output from previous day: 09/05 0701 - 09/06 0700 In: 4547.5 [I.V.:2197.5; IV Piggyback:2350] Out: 0  Intake/Output from this shift: Total I/O In: 550 [I.V.:300; IV Piggyback:250] Out: -   Labs:  Recent Labs  03/06/17 2235 03/07/17 1121  WBC 24.8*  --   HGB 13.8  --   HCT 43.2  --   PLT 327  --   CREATININE 0.74 0.56  MG  --  1.7  PHOS  --  2.1*  ALBUMIN 3.1*  --   PROT 6.1*  --   AST 24  --   ALT 16  --   ALKPHOS 99  --   BILITOT 0.8  --    Estimated Creatinine Clearance: 56.4 mL/min (by C-G formula based on SCr of 0.56 mg/dL).   Microbiology: Recent Results (from the past 720 hour(s))  Blood Culture (routine x 2)     Status: None (Preliminary result)   Collection Time: 03/06/17 10:36 PM  Result Value Ref Range Status   Specimen Description BLOOD BLOOD LEFT HAND  Final   Special Requests   Final    BOTTLES DRAWN AEROBIC AND ANAEROBIC Blood Culture results may not be optimal due to an excessive volume of blood received in culture bottles   Culture NO GROWTH < 12 HOURS  Final   Report Status PENDING  Incomplete  Blood Culture (routine x 2)     Status: None (Preliminary result)   Collection Time: 03/06/17 10:36 PM  Result Value Ref Range Status   Specimen Description BLOOD BLOOD RIGHT HAND  Final   Special Requests   Final    BOTTLES DRAWN AEROBIC AND ANAEROBIC Blood Culture adequate volume   Culture NO GROWTH < 12  HOURS  Final   Report Status PENDING  Incomplete  MRSA PCR Screening     Status: None   Collection Time: 03/07/17  1:15 AM  Result Value Ref Range Status   MRSA by PCR NEGATIVE NEGATIVE Final    Comment:        The GeneXpert MRSA Assay (FDA approved for NASAL specimens only), is one component of a comprehensive MRSA colonization surveillance program. It is not intended to diagnose MRSA infection nor to guide or monitor treatment for MRSA infections.      Assessment: Diana Mccoy is a [age] YOF with history of COPD, HTN, and recent stroke that presented to the ED in respiratory distress.  Patient given potassium 10 mEq IV x 4 doses and KPhos x 2 doses.   Patient given magnesium sulfate 2g IV x 1 dose.    Will recheck labs in the am.    Dwain SarnaMeredith Alexsandra Shontz, PharmD Student 03/07/2017,1:52 PM

## 2017-03-07 NOTE — Progress Notes (Signed)
[employer] [facility] [place] [facility]   PATIENT NAME: Diana Mccoy    MR#:  [id number]  DATE OF BIRTH:  04-23-1946  SUBJECTIVE:  CHIEF COMPLAINT:   Chief Complaint  Patient presents with  . Respiratory Distress  recent stroke, nonverbal, sent with sepsis from NH, not able to give any hx or symptoms. Initially required Bipap , noe only at night.  REVIEW OF SYSTEMS:  CONSTITUTIONAL: No fever, fatigue or weakness.  EYES: No blurred or double vision.  EARS, NOSE, AND THROAT: No tinnitus or ear pain.  RESPIRATORY: positive for cough, shortness of breath, wheezing ,no hemoptysis.  CARDIOVASCULAR: No chest pain, orthopnea, edema.  GASTROINTESTINAL: No nausea, vomiting, diarrhea or abdominal pain.  GENITOURINARY: No dysuria, hematuria.  ENDOCRINE: No polyuria, nocturia,  HEMATOLOGY: No anemia, easy bruising or bleeding SKIN: No rash or lesion. MUSCULOSKELETAL: No joint pain or arthritis.   NEUROLOGIC: No tingling, numbness, weakness.  PSYCHIATRY: No anxiety or depression.   ROS  DRUG ALLERGIES:   Allergies  Allergen Reactions  . Ciprofloxacin Nausea And Vomiting  . Penicillins     rash, hives  . Sodium Thiosulfate   . Sulfa Antibiotics     VITALS:  Blood pressure (!) 128/49, pulse 87, temperature 98.5 F (36.9 C), temperature source Axillary, resp. rate 19, height  (1.651 m), weight 63.8 kg (140 lb 10.5 oz), SpO2 97 %.  PHYSICAL EXAMINATION:  GENERAL:  71 y.o.-year-old patient lying in the bed with no acute distress.  EYES: Pupils equal, round, reactive to light and accommodation. No scleral icterus. Extraocular muscles intact.  HEENT: Head atraumatic, normocephalic. Oropharynx and nasopharynx clear.  NECK:  Supple, no jugular venous distention. No thyroid enlargement, no tenderness.  LUNGS: Normal breath sounds bilaterally, some wheezing, some crepitation. No use of accessory muscles of respiration.  CARDIOVASCULAR: S1, S2 normal. No murmurs, rubs, or  gallops.  ABDOMEN: Soft, nontender, nondistended. Bowel sounds present. No organomegaly or mass.  EXTREMITIES: No pedal edema, cyanosis, or clubbing.  NEUROLOGIC: Cranial nerves II through XII are intact. Muscle strength 3/5 right side and 0/5 left side, follows commands. Gait not checked. Nonverbal,  PSYCHIATRIC: The patient is alert and oriented x 1, nonverbal.  SKIN: No obvious rash, lesion, or ulcer.   Physical Exam LABORATORY PANEL:   CBC  Recent Labs Lab 03/06/17 2235  WBC 24.8*  HGB 13.8  HCT 43.2  PLT 327   ------------------------------------------------------------------------------------------------------------------  Chemistries   Recent Labs Lab 03/06/17 2235 03/07/17 1121  NA 158* 153*  K 3.1* 3.7  CL 118* 120*  CO2 29 26  GLUCOSE 208* 181*  BUN 24* 19  CREATININE 0.74 0.56  CALCIUM 9.1 8.1*  MG  --  1.7  AST 24  --   ALT 16  --   ALKPHOS 99  --   BILITOT 0.8  --    ------------------------------------------------------------------------------------------------------------------  Cardiac Enzymes No results for input(s): TROPONINI in the last 168 hours. ------------------------------------------------------------------------------------------------------------------  RADIOLOGY:  Dg Chest Portable 1 View  Result Date: 03/06/2017 CLINICAL DATA:  Respiratory distress EXAM: PORTABLE CHEST 1 VIEW COMPARISON:  11/11/2016 FINDINGS: No acute consolidation or effusion. Cardiomediastinal silhouette within normal limits. Aortic atherosclerosis. No pneumothorax. IMPRESSION: No active disease. Electronically Signed   By: Jasmine Pang M.D.   On: 03/06/2017 23:12    ASSESSMENT AND PLAN:   Active Problems:   Septic shock [facility])  This is a 71 year old female admitted for septic shock. 1. Septic shock: The patient is criteria for sepsis via tachycardia, tachypnea,  leukocytosis and fever. Initially she was hypotensive but has been fluid responsive. Source is  unknown at this time- likely bronchitis. Continue broad-spectrum antibiotics. Follow blood cultures for growth and sensitivities.   As procalcitonin is low, Intensivist suggested to stop Abx. 2. Respiratory failure with hypoxemia: COPD exacerbation  Continue BiPAP. Supple with oxygen as needed. 3. COPD exacerbation: The patient is received Solu-Medrol. Continue scheduled duonebs. 4. DVT prophylaxis: Lovenox 5. GI prophylaxis: None  Need swallow eval.  All the records are reviewed and case discussed with Care Management/Social Workerr. Management plans discussed with the patient, family and they are in agreement.  CODE STATUS: DNR  TOTAL TIME TAKING CARE OF THIS PATIENT: 35 minutes.     POSSIBLE D/C IN 1-2 DAYS, DEPENDING ON CLINICAL CONDITION.   Altamese DillingVACHHANI, Ace Bergfeld M.D on 03/07/2017   Between 7am to 6pm - Pager - [phone]  After 6pm go to [email or web address] - password [email or web address]  [facility]  Office  [phone]  CC: Primary care physician; Malva LimesFisher, Donald E, MD  Note: This dictation was prepared with Dragon dictation along with smaller phrase technology. Any transcriptional errors that result from this process are unintentional.

## 2017-03-07 NOTE — Consult Note (Signed)
Name: Diana Mccoy MRN: [id number] DOB: 06/27/1946    ADMISSION DATE:  03/06/2017  CONSULTATION DATE:  03/06/17  REFERRING MD :  Dr. Sheryle Hailiamond  CHIEF COMPLAINT: Shortness of breath  BRIEF PATIENT DESCRIPTION: 71 year old nursing home patient with history of COPD,came with hypoxia requiring BiPAP  SIGNIFICANT EVENTS  9/6 patient admitted to the SDU with acute on chronic respiratory failure requiring BiPAP  STUDIES:  None   HISTORY OF PRESENT ILLNESS: Diana Mccoy is a 71 year old Nursing home resident with known history of Bipolar disorder,COPD,Hypertension,depression,Schizophrenia and stroke. Patient was noted to be short of breath at her facility and O2 sats were down to 78%.  She was placed on C-PAP and her O2 Sats were about 89%. She was noted to be unresponsive with frothy sputum .  Daughter confirmed the code status that she is a DNR. She was suctioned and was placed on BiPAP. Her oxygenation improved .  She received fluid boluses for hypotension and her blood pressure responded well to the Crystalloids.  Patient was sent to the SDU on BiPAP for closer monitoring.  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  has a past surgical history that includes Hip fracture surgery (Left). Prior to Admission medications   Medication Sig Start Date End Date Taking? Authorizing Provider  acetaminophen (TYLENOL) 325 MG tablet Take 650 mg by mouth.   Yes [provider]    aspirin EC 81 MG tablet Take 81 mg by mouth daily.   Yes [provider]  bisacodyl (DULCOLAX) 10 MG suppository Place 10 mg rectally daily as needed for moderate constipation.   Yes [provider]  fentaNYL (DURAGESIC - DOSED MCG/HR) 12 MCG/HR Place 12.5 mcg onto the skin every 3 (three) days.   Yes [provider]  hyoscyamine (LEVSIN SL) 0.125 MG SL tablet Place 0.125-0.25 mg under the tongue every 4 (four) hours as needed.   Yes [provider]  ipratropium-albuterol (DUONEB) 0.5-2.5 (3) MG/3ML SOLN Take 3 mLs by nebulization every 4 (four) hours as needed.   Yes [provider]  lidocaine (LIDODERM) 5 % Place 1 patch onto the skin daily. Remove & Discard patch within 12 hours or as directed by MD   Yes [provider]  LORazepam (ATIVAN) 0.5 MG tablet Take 0.5 mg by mouth daily.   Yes [provider]  morphine 20 MG/5ML solution Take 5-10 mg by mouth every hour as needed for pain.   Yes [provider]  ondansetron (ZOFRAN-ODT) 8 MG disintegrating tablet Take 8 mg by mouth every 8 (eight) hours as needed for nausea or vomiting.   Yes [provider]  sertraline (ZOLOFT) 50 MG tablet Take 50 mg by mouth daily.   Yes [provider]  trolamine salicylate (ASPERCREME) 10 % cream Apply 1 application topically as needed for muscle pain.   Yes [provider]   Allergies  Allergen Reactions  . Ciprofloxacin Nausea And Vomiting  . Penicillins     rash, hives  . Sodium Thiosulfate   . Sulfa Antibiotics     FAMILY HISTORY:  family history includes Gout in her father. SOCIAL  HISTORY:  reports that she has been smoking Cigarettes.  She has a 30.00 pack-year smoking history. She has never used smokeless tobacco. She reports that she does not drink alcohol or use drugs.  REVIEW OF SYSTEMS:   Constitutional: Negative for fever, chills, weight loss, malaise/fatigue and diaphoresis.  HENT: Negative  for hearing loss, ear pain, nosebleeds, congestion, sore throat, neck pain, tinnitus and ear discharge.   Eyes: Negative for blurred vision, double vision, photophobia, pain, discharge and redness.  Respiratory: Negative for cough, hemoptysis, sputum production, shortness of breath, wheezing and stridor.   Cardiovascular: Negative for chest pain, palpitations, orthopnea, claudication, leg swelling and PND.  Gastrointestinal: Negative for heartburn, nausea, vomiting, abdominal pain, diarrhea, constipation, blood in stool and melena.  Genitourinary: Negative for dysuria, urgency, frequency, hematuria and flank pain.  Musculoskeletal: Negative for myalgias, back pain, joint pain and falls.  Skin: Negative for itching and rash.  Neurological: Negative for dizziness, tingling, tremors, sensory change, speech change, focal weakness, seizures, loss of consciousness, weakness and headaches.  Endo/Heme/Allergies: Negative for environmental allergies and polydipsia. Does not bruise/bleed easily.  SUBJECTIVE: Patient did not speak to me at all and was just looking at me .  Patient is comfortable and is not in any acute distress.  VITAL SIGNS: Temp:  [98.2 F (36.8 C)-100.7 F (38.2 C)] 98.2 F (36.8 C) (09/06 0100) Pulse Rate:  [105-159] 105 (09/06 0100) Resp:  [15-35] 18 (09/06 0100) BP: (86-142)/(48-68) 122/56 (09/06 0100) SpO2:  [96 %-100 %] 100 % (09/06 0100) FiO2 (%):  [100 %] 100 % (09/06 0155) Weight:  [62.1 kg (137 lb)-72.6 kg (160 lb)] 63.8 kg (140 lb 10.5 oz) (09/06 0100)  PHYSICAL EXAMINATION: General:  71 year old female on BiPAP Neuro:  Awake HEENT:  AT,[place],No jvd Cardiovascular:  S1s2,regular, no jvd Lungs: diminished throughout , no wheezes,crackles,rhonchi Abdomen:Soft, NT,ND,Positive bowel sounds Musculoskeletal:  No edema,cyanosis Skin:  Warm ,dry and Intact   Recent Labs Lab 03/06/17 2235  NA 158*  K 3.1*  CL 118*  CO2 29  BUN 24*  CREATININE 0.74  GLUCOSE 208*     Recent Labs Lab 03/06/17 2235  HGB 13.8  HCT 43.2  WBC 24.8*  PLT 327   Dg Chest Portable 1 View  Result Date: 03/06/2017 CLINICAL DATA:  Respiratory distress EXAM: PORTABLE CHEST 1 VIEW COMPARISON:  11/11/2016 FINDINGS: No acute consolidation or effusion. Cardiomediastinal silhouette within normal limits. Aortic atherosclerosis. No pneumothorax. IMPRESSION: No active disease. Electronically Signed   By: Jasmine Pang M.D.   On: 03/06/2017 23:12    ASSESSMENT / PLAN:  Acute on Chronic respiratory failure secondary to COPD exacerbation Hypernatremia Hypokalemia Leukocytosis possibly related to stress response Mildly elevated lactic acid Sepsis of unclear etiology   Plan Continue BiPAP, wean as tolerated. Patient is a DNR Continue Steroids.taper Continue Bronchodilators Will need to discontinue antibiotics, if no clear source  Follow Cultures Monitor fever,cbc Follow BMET Trend lactic acid and PCT  Antoinetta Berrones,AG-ACNP Pulmonary and Critical Care Medicine [facility]   03/07/2017, 1:59 AM

## 2017-03-07 NOTE — Progress Notes (Signed)
eLink Physician-Brief Progress Note Patient Name: Diana Mccoy DOB: 11/16/1945 MRN: [id number]   Date of Service  03/07/2017  HPI/Events of Note  [age] F resident of NH with h/o of COPD presenting with hypoxia and meeting sepsis criteria.  Given ABX and placed on BiPAP.  Now with BP of 122/46 HR 98 RR 15 and sats of 100%. Admitted to SDU on hospitalist service.   Camera check shows the patient to be resting comfortably on BiPAP.  Patient is a DNR/DNI  eICU Interventions  Plan of care per primary admitting team Continue with BiPAP To be seen by PCCM     Intervention Category Evaluation Type: New Patient Evaluation  Diana Mccoy 03/07/2017, 1:45 AM

## 2017-03-07 NOTE — Progress Notes (Signed)
Dr. Belia HemanKasa present and gave order to transfer patient to any floor without tele and to use bipap at night and o2 sat goal is 88%.

## 2017-03-07 NOTE — Progress Notes (Signed)
Alert watching tv.  Non-verbal but follows commands.  Does not answer yes and no questions by shaking her head when asked.  No respiratory distress with o2 sats low 90's on RA.  Daughter visited twice during shift.

## 2017-03-07 NOTE — H&P (Addendum)
Diana Mccoy is an 71 y.o. female.   Chief Complaint: Respiratory distress HPI: The patient with past medical history of COPD, hypertension and recent stroke presents emergency department from her nursing home in respiratory distress. The patient is unable to provide history as she appears to be dysarthric. BiPAP is also in place making it difficult for her to communicate but she responds to verbal stimuli. Chest x-ray showed no active disease. However laboratory evaluation significant for hypernatremia as well as low potassium and lactic acidosis. The patient also had a significant leukocytosis of 24.8. The emergency department initiated septis protocol. The patient's blood pressure was also as low as mid 09F systolic which prompted aggressive fluid resuscitation to which the patient responded positively. Once she was stabilized emergency department staff called the hospitalist service for admission.  Past Medical History:  Diagnosis Date  . Acid reflux 12/28/2014  . Affective bipolar disorder ([place]) 12/28/2014  . Anxiety 12/28/2014  . BP (high blood pressure) 12/28/2014  . CAFL (chronic airflow limitation) ([place]) 12/28/2014  . COPD (chronic obstructive pulmonary disease) ([place]) 01/19/2016  . Depression 03/28/2015  . 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 Disorder of kidney 12/28/2014  . Gait instability 04/18/2015  . Osteoarthritis 03/28/2015  . 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 Schizophrenia ([place]) 03/28/2015  . Scoliosis 12/28/2014  . Spinal stenosis 12/28/2014    Past Surgical History:  Procedure Laterality Date  . HIP FRACTURE SURGERY Left     Family History  Problem Relation Age of Onset  . Gout Father    Social History:  reports that she has been smoking Cigarettes.  She has a 30.00 pack-year smoking history. She has never used smokeless tobacco. She reports that she does not drink alcohol or use drugs.  Allergies:  Allergies  Allergen Reactions  . Ciprofloxacin Nausea And  Vomiting  . Penicillins     rash, hives  . Sodium Thiosulfate   . Sulfa Antibiotics     Medications Prior to Admission  Medication Sig Dispense Refill  . acetaminophen (TYLENOL) 325 MG tablet Take 650 mg by mouth.    Marland Kitchen aspirin EC 81 MG tablet Take 81 mg by mouth daily.    . bisacodyl (DULCOLAX) 10 MG suppository Place 10 mg rectally daily as needed for moderate constipation.    . fentaNYL (DURAGESIC - DOSED MCG/HR) 12 MCG/HR Place 12.5 mcg onto the skin every 3 (three) days.    . hyoscyamine (LEVSIN SL) 0.125 MG SL tablet Place 0.125-0.25 mg under the tongue every 4 (four) hours as needed.    Marland Kitchen ipratropium-albuterol (DUONEB) 0.5-2.5 (3) MG/3ML SOLN Take 3 mLs by nebulization every 4 (four) hours as needed.    . lidocaine (LIDODERM) 5 % Place 1 patch onto the skin daily. Remove & Discard patch within 12 hours or as directed by MD    . LORazepam (ATIVAN) 0.5 MG tablet Take 0.5 mg by mouth daily.    Marland Kitchen morphine 20 MG/5ML solution Take 5-10 mg by mouth every hour as needed for pain.    Marland Kitchen ondansetron (ZOFRAN-ODT) 8 MG disintegrating tablet Take 8 mg by mouth every 8 (eight) hours as needed for nausea or vomiting.    . sertraline (ZOLOFT) 50 MG tablet Take 50 mg by mouth daily.    Marland Kitchen trolamine salicylate (ASPERCREME) 10 % cream Apply 1 application topically as needed for muscle pain.      Results for orders placed or performed during the hospital encounter of 03/06/17 (from the past  48 hour(s))  Blood gas, arterial     Status: Abnormal   Collection Time: 03/06/17 10:32 PM  Result Value Ref Range   FIO2 1.00    Delivery systems BILEVEL POSITIVE AIRWAY PRESSURE    LHR 12 resp/min   Inspiratory PAP 15    Expiratory PAP 5    pH, Arterial 7.34 (L) 7.350 - 7.450   pCO2 arterial 58 (H) 32.0 - 48.0 mmHg   pO2, Arterial 207 (H) 83.0 - 108.0 mmHg   Bicarbonate 31.3 (H) 20.0 - 28.0 mmol/L   Acid-Base Excess 3.9 (H) 0.0 - 2.0 mmol/L   O2 Saturation 99.7 %   Patient temperature 37.0    Collection  site RIGHT RADIAL    Sample type ARTERIAL DRAW    Allens test (pass/fail) PASS PASS  CBC with Differential     Status: Abnormal   Collection Time: 03/06/17 10:35 PM  Result Value Ref Range   WBC 24.8 (H) 3.6 - 11.0 K/uL   RBC 4.21 3.80 - 5.20 MIL/uL   Hemoglobin 13.8 12.0 - 16.0 g/dL   HCT 43.2 35.0 - 47.0 %   MCV 102.5 (H) 80.0 - 100.0 fL   MCH 32.9 26.0 - 34.0 pg   MCHC 32.1 32.0 - 36.0 g/dL   RDW 14.4 11.5 - 14.5 %   Platelets 327 150 - 440 K/uL   Neutrophils Relative % 64 %   Lymphocytes Relative 25 %   Monocytes Relative 9 %   Eosinophils Relative 1 %   Basophils Relative 1 %   Neutro Abs 16.0 (H) 1.4 - 6.5 K/uL   Lymphs Abs 6.2 (H) 1.0 - 3.6 K/uL   Monocytes Absolute 2.2 (H) 0.2 - 0.9 K/uL   Eosinophils Absolute 0.2 0 - 0.7 K/uL   Basophils Absolute 0.2 (H) 0 - 0.1 K/uL   Smear Review MORPHOLOGY UNREMARKABLE   Comprehensive metabolic panel     Status: Abnormal   Collection Time: 03/06/17 10:35 PM  Result Value Ref Range   Sodium 158 (H) 135 - 145 mmol/L   Potassium 3.1 (L) 3.5 - 5.1 mmol/L   Chloride 118 (H) 101 - 111 mmol/L   CO2 29 22 - 32 mmol/L   Glucose, Bld 208 (H) 65 - 99 mg/dL   BUN 24 (H) 6 - 20 mg/dL   Creatinine, Ser 0.74 0.44 - 1.00 mg/dL   Calcium 9.1 8.9 - 10.3 mg/dL   Total Protein 6.1 (L) 6.5 - 8.1 g/dL   Albumin 3.1 (L) 3.5 - 5.0 g/dL   AST 24 15 - 41 U/L   ALT 16 14 - 54 U/L   Alkaline Phosphatase 99 38 - 126 U/L   Total Bilirubin 0.8 0.3 - 1.2 mg/dL   GFR calc non Af Amer >60 >60 mL/min   GFR calc Af Amer >60 >60 mL/min    Comment: (NOTE) The eGFR has been calculated using the CKD EPI equation. This calculation has not been validated in all clinical situations. eGFR's persistently <60 mL/min signify possible Chronic Kidney Disease.    Anion gap 11 5 - 15  Lactic acid, plasma     Status: Abnormal   Collection Time: 03/06/17 10:35 PM  Result Value Ref Range   Lactic Acid, Venous 2.6 (HH) 0.5 - 1.9 mmol/L    Comment: CRITICAL RESULT CALLED  TO, READ BACK BY AND VERIFIED WITH HEBRY RIVERA AT 2312 03/06/17 ALV   TSH     Status: None   Collection Time: 03/06/17 10:35 PM  Result  Value Ref Range   TSH 1.191 0.350 - 4.500 uIU/mL    Comment: Performed by a 3rd Generation assay with a functional sensitivity of <=0.01 uIU/mL.  Glucose, capillary     Status: Abnormal   Collection Time: 03/07/17  1:18 AM  Result Value Ref Range   Glucose-Capillary 163 (H) 65 - 99 mg/dL   Dg Chest Portable 1 View  Result Date: 03/06/2017 CLINICAL DATA:  Respiratory distress EXAM: PORTABLE CHEST 1 VIEW COMPARISON:  11/11/2016 FINDINGS: No acute consolidation or effusion. Cardiomediastinal silhouette within normal limits. Aortic atherosclerosis. No pneumothorax. IMPRESSION: No active disease. Electronically Signed   By: Donavan Foil M.D.   On: 03/06/2017 23:12    Review of Systems  Unable to perform ROS: Medical condition    Blood pressure (!) 122/56, pulse (!) 105, temperature 98.2 F (36.8 C), temperature source Axillary, resp. rate 18, height 5' 5"  (1.651 m), weight 63.8 kg (140 lb 10.5 oz), SpO2 100 %. Physical Exam  Vitals reviewed. Constitutional: She is oriented to person, place, and time. She appears well-developed and well-nourished. No distress.  HENT:  Head: Normocephalic and atraumatic.  Mouth/Throat: Oropharynx is clear and moist.  Eyes: Pupils are equal, round, and reactive to light. Conjunctivae and EOM are normal. No scleral icterus.  Neck: Normal range of motion. Neck supple. No JVD present. No tracheal deviation present. No thyromegaly present.  Cardiovascular: Normal rate, regular rhythm and normal heart sounds.  Exam reveals no gallop and no friction rub.   No murmur heard. Respiratory: Effort normal and breath sounds normal.  GI: Soft. Bowel sounds are normal. She exhibits no distension. There is no tenderness.  Genitourinary:  Genitourinary Comments: Deferred  Musculoskeletal: Normal range of motion. She exhibits no edema.   Lymphadenopathy:    She has no cervical adenopathy.  Neurological: She is alert and oriented to person, place, and time. No cranial nerve deficit. She exhibits normal muscle tone.  Right hemiparesis  Skin: Skin is warm and dry. No rash noted. No erythema.  Psychiatric: She has a normal mood and affect. Her behavior is normal. Judgment and thought content normal.     Assessment/Plan This is a 71 year old female admitted for septic shock. 1. Septic shock: The patient is criteria for sepsis via tachycardia, tachypnea, leukocytosis and fever. Initially she was hypotensive but has been fluid responsive. Source is unknown at this time. Continue broad-spectrum antibiotics. Follow blood cultures for growth and sensitivities. 2. Respiratory failure with hypoxemia: Continue BiPAP. Supple with oxygen as needed. 3. COPD: The patient is received Solu-Medrol. Continue scheduled duonebs. 4. DVT prophylaxis: Lovenox 5. GI prophylaxis: None The patient is a DO NOT RESUSCITATE. Time spent on admission orders and critical care approximately 45 minutes. Discussed with E-Link telemedicine  Harrie Foreman, MD 03/07/2017, 2:39 AM

## 2017-03-07 NOTE — Progress Notes (Addendum)
Visit made. 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 She is a DNR code with an out of facility DNR in place. [facility] by facility staff late last night for evaluation of shortness of breath, she required Bipap on arrival to the ED. Hospital care team aware of Hospice involvement.  Patient seen lying in bed this morning at 10 am, eyes closed, no on room air with oxygen saturations in the 90's. Patient did not awaken to voice or touc. Per chart note review plan is for transfer out of ICU to a medical floor. No family at bedside. Writer returned later in the afternoon, patient was alert and awake, she is nonverbal at baseline. Did not appear to be in any distress, remains on room air. Speech eval pending. Per staff RN [place] patient was able to take her medications in applesauce. Patient remains on IV fluids and IV steroids for treatment of acute COPD exacerbation. Will continue to follow and update hospice team. Updated notes faxed to hospice triage. Transfer summary in place in patient's chart.Thank you. Dayna BarkerKaren Robertson RN, BSN, [facility], hospital Liaison [phone]

## 2017-03-08 ENCOUNTER — Inpatient Hospital Stay

## 2017-03-08 DIAGNOSIS — L8990 Pressure ulcer of unspecified site, unspecified stage: Secondary | ICD-10-CM | POA: Insufficient documentation

## 2017-03-08 LAB — BASIC METABOLIC PANEL WITH GFR
Anion gap: 5 (ref 5–15)
BUN: 17 mg/dL (ref 6–20)
CO2: 29 mmol/L (ref 22–32)
Calcium: 8.4 mg/dL — ABNORMAL LOW (ref 8.9–10.3)
Chloride: 116 mmol/L — ABNORMAL HIGH (ref 101–111)
Creatinine, Ser: 0.48 mg/dL (ref 0.44–1.00)
GFR calc Af Amer: >60 mL/min
GFR calc non Af Amer: >60 mL/min
Glucose, Bld: 127 mg/dL — ABNORMAL HIGH (ref 65–99)
Potassium: 3.1 mmol/L — ABNORMAL LOW (ref 3.5–5.1)
Sodium: 150 mmol/L — ABNORMAL HIGH (ref 135–145)

## 2017-03-08 LAB — CBC
HCT: 37.4 % (ref 35.0–47.0)
Hemoglobin: 12.3 g/dL (ref 12.0–16.0)
MCH: 34.2 pg — ABNORMAL HIGH (ref 26.0–34.0)
MCHC: 33 g/dL (ref 32.0–36.0)
MCV: 103.7 fL — ABNORMAL HIGH (ref 80.0–100.0)
Platelets: 167 10*3/uL (ref 150–440)
RBC: 3.61 MIL/uL — ABNORMAL LOW (ref 3.80–5.20)
RDW: 14.5 % (ref 11.5–14.5)
WBC: 15.2 10*3/uL — ABNORMAL HIGH (ref 3.6–11.0)

## 2017-03-08 LAB — BLOOD CULTURE ID PANEL (REFLEXED)

## 2017-03-08 LAB — PROCALCITONIN: Procalcitonin: <0.1 ng/mL

## 2017-03-08 LAB — POTASSIUM: Potassium: 3.7 mmol/L (ref 3.5–5.1)

## 2017-03-08 LAB — PHOSPHORUS: Phosphorus: 1.9 mg/dL — ABNORMAL LOW (ref 2.5–4.6)

## 2017-03-08 LAB — MAGNESIUM: Magnesium: 2.4 mg/dL (ref 1.7–2.4)

## 2017-03-08 MED ORDER — POTASSIUM CHLORIDE 2 MEQ/ML IV SOLN
INTRAVENOUS | Status: DC
  Administered 2017-03-08 – 2017-03-09 (×2): via INTRAVENOUS
  Filled 2017-03-08 (×3): qty 1000

## 2017-03-08 MED ORDER — IPRATROPIUM-ALBUTEROL 0.5-2.5 (3) MG/3ML IN SOLN
3.0000 mL | RESPIRATORY_TRACT | Status: DC | PRN
Start: 2017-03-08 — End: 2017-03-11

## 2017-03-08 MED ORDER — VANCOMYCIN HCL IN DEXTROSE 1-5 GM/200ML-% IV SOLN
1000.0000 mg | Freq: Once | INTRAVENOUS | Status: DC
  Filled 2017-03-08: qty 200

## 2017-03-08 MED ORDER — VANCOMYCIN HCL IN DEXTROSE 1-5 GM/200ML-% IV SOLN
1000.0000 mg | INTRAVENOUS | Status: DC
  Filled 2017-03-08: qty 200

## 2017-03-08 MED ORDER — IPRATROPIUM-ALBUTEROL 0.5-2.5 (3) MG/3ML IN SOLN
3.0000 mL | Freq: Four times a day (QID) | RESPIRATORY_TRACT | Status: DC
  Administered 2017-03-08 – 2017-03-09 (×5): 3 mL via RESPIRATORY_TRACT
  Filled 2017-03-08 (×5): qty 3

## 2017-03-08 MED ORDER — VANCOMYCIN HCL IN DEXTROSE 1-5 GM/200ML-% IV SOLN
1000.0000 mg | INTRAVENOUS | Status: DC
  Administered 2017-03-08: 1000 mg via INTRAVENOUS
  Filled 2017-03-08 (×3): qty 200

## 2017-03-08 MED ORDER — POTASSIUM CHLORIDE 10 MEQ/100ML IV SOLN
10.0000 meq | INTRAVENOUS | Status: AC
  Administered 2017-03-08 (×4): 10 meq via INTRAVENOUS
  Filled 2017-03-08 (×3): qty 100

## 2017-03-08 MED ORDER — K PHOS MONO-SOD PHOS DI & MONO 155-852-130 MG PO TABS
500.0000 mg | ORAL_TABLET | ORAL | Status: DC
  Administered 2017-03-08 (×2): 500 mg via ORAL
  Filled 2017-03-08 (×4): qty 2

## 2017-03-08 MED ORDER — VANCOMYCIN HCL IN DEXTROSE 1-5 GM/200ML-% IV SOLN
1000.0000 mg | Freq: Once | INTRAVENOUS | Status: AC
  Administered 2017-03-08: 1000 mg via INTRAVENOUS
  Filled 2017-03-08: qty 200

## 2017-03-08 NOTE — Consult Note (Signed)
[facility] Infectious Disease     Reason for Consult: Bacteremic   Referring Physician: Dolores Frame Date of Admission:  03/06/2017   Active Problems:   Septic shock ([place])   Pressure injury of skin   HPI: Marieanne M Laprise is a 71 y.o. female with CVA, on hospice at [facility] admitted with resp distress.  Placed on Bipap. On admit wbc elevated and temp 100.7. BCX + 1/2 staph species.  Received dose of cefepime then vanco. PC neg. CXR neg  Past Medical History:  Diagnosis Date  . Acid reflux 12/28/2014  . 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 Anxiety 12/28/2014  . BP (high blood pressure) 12/28/2014  . CAFL (chronic airflow limitation) ([place]) 12/28/2014  . COPD (chronic obstructive pulmonary disease) ([place]) 01/19/2016  . Depression 03/28/2015  . 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 Disorder of kidney 12/28/2014  . Gait instability 04/18/2015  . Osteoarthritis 03/28/2015  . 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 Schizophrenia ([place]) 03/28/2015  . Scoliosis 12/28/2014  . Spinal stenosis 12/28/2014   Past Surgical History:  Procedure Laterality Date  . HIP FRACTURE SURGERY Left    Social History  Substance Use Topics  . Smoking status: Current Every Day Smoker    Packs/day: 1.00    Years: 30.00    Types: Cigarettes  . Smokeless tobacco: Never Used  . Alcohol use No   Family History  Problem Relation Age of Onset  . Gout Father     Allergies:  Allergies  Allergen Reactions  . Ciprofloxacin Nausea And Vomiting  . Penicillins     rash, hives  . Sodium Thiosulfate   . Sulfa Antibiotics     Current antibiotics: Antibiotics Given (last 72 hours)    Date/Time Action Medication Dose Rate   03/06/17 2349 New Bag/Given   ceFEPIme (MAXIPIME) 2 g in dextrose 5 % 50 mL IVPB 2 g 100 mL/hr   03/07/17 0022 New Bag/Given   vancomycin (VANCOCIN) IVPB 1000 mg/200 mL premix 1,000 mg 200 mL/hr   03/07/17 0902 New Bag/Given   vancomycin (VANCOCIN) IVPB 1000 mg/200 mL premix 1,000 mg  200 mL/hr      MEDICATIONS: . aspirin EC  81 mg Oral Daily  . docusate sodium  100 mg Oral BID  . enoxaparin (LOVENOX) injection  40 mg Subcutaneous Q24H  . fentaNYL  12.5 mcg Transdermal Q72H  . ipratropium-albuterol  3 mL Nebulization Q6H  . LORazepam  0.5 mg Oral Daily  . methylPREDNISolone (SOLU-MEDROL) injection  20 mg Intravenous Daily  . phosphorus  500 mg Oral Q4H  . sertraline  50 mg Oral Daily    Review of Systems - 11 systems reviewed and negative per HPI   OBJECTIVE: Temp:  [97.7 F (36.5 C)-98.6 F (37 C)] 98.6 F (37 C) (09/07 0726) Pulse Rate:  [87-104] 104 (09/07 0726) Resp:  [12-22] 18 (09/07 0726) BP: (116-139)/(49-59) 139/59 (09/07 0726) SpO2:  [90 %-100 %] 99 % (09/07 0826) FiO2 (%):  [24 %] 24 % (09/07 0415) Weight:  [65.3 kg (144 lb)-65.7 kg (144 lb 14.4 oz)] 65.3 kg (144 lb) (09/07 0429) GENERAL:  71 y.o.-year-old patient lying in the bed with no acute distress.  EYES: Pupils equal, round, reactive to light and accommodation. No scleral icterus. Extraocular muscles intact.  HEENT: Head atraumatic, normocephalic. Oropharynx and nasopharynx clear.  NECK:  Supple, no jugular venous distention. No thyroid enlargement, no tenderness.  LUNGS: Normal breath sounds bilaterally, some wheezing, some crepitation. No use of  accessory muscles of respiration.  CARDIOVASCULAR: S1, S2 normal. No murmurs, rubs, or gallops.  ABDOMEN: Soft, nontender, nondistended. Bowel sounds present. No organomegaly or mass.  EXTREMITIES: No pedal edema, cyanosis, or clubbing.  NEUROLOGIC: Cranial nerves II through XII are intact. Muscle strength 3/5 right side and 0/5 left side, follows commands. Gait not checked. Nonverbal,  PSYCHIATRIC: The patient is alert and oriented x 1, nonverbal.  SKIN: No obvious rash, lesion, or ulcer.   LABS: Results for orders placed or performed during the hospital encounter of 03/06/17 (from the past 48 hour(s))  Blood gas, arterial     Status:  Abnormal   Collection Time: 03/06/17 10:32 PM  Result Value Ref Range   FIO2 1.00    Delivery systems BILEVEL POSITIVE AIRWAY PRESSURE    LHR 12 resp/min   Inspiratory PAP 15    Expiratory PAP 5    pH, Arterial 7.34 (L) 7.350 - 7.450   pCO2 arterial 58 (H) 32.0 - 48.0 mmHg   pO2, Arterial 207 (H) 83.0 - 108.0 mmHg   Bicarbonate 31.3 (H) 20.0 - 28.0 mmol/L   Acid-Base Excess 3.9 (H) 0.0 - 2.0 mmol/L   O2 Saturation 99.7 %   Patient temperature 37.0    Collection site RIGHT RADIAL    Sample type ARTERIAL DRAW    Allens test (pass/fail) PASS PASS  CBC with Differential     Status: Abnormal   Collection Time: 03/06/17 10:35 PM  Result Value Ref Range   WBC 24.8 (H) 3.6 - 11.0 K/uL   RBC 4.21 3.80 - 5.20 MIL/uL   Hemoglobin 13.8 12.0 - 16.0 g/dL   HCT 43.2 35.0 - 47.0 %   MCV 102.5 (H) 80.0 - 100.0 fL   MCH 32.9 26.0 - 34.0 pg   MCHC 32.1 32.0 - 36.0 g/dL   RDW 14.4 11.5 - 14.5 %   Platelets 327 150 - 440 K/uL   Neutrophils Relative % 64 %   Lymphocytes Relative 25 %   Monocytes Relative 9 %   Eosinophils Relative 1 %   Basophils Relative 1 %   Neutro Abs 16.0 (H) 1.4 - 6.5 K/uL   Lymphs Abs 6.2 (H) 1.0 - 3.6 K/uL   Monocytes Absolute 2.2 (H) 0.2 - 0.9 K/uL   Eosinophils Absolute 0.2 0 - 0.7 K/uL   Basophils Absolute 0.2 (H) 0 - 0.1 K/uL   Smear Review MORPHOLOGY UNREMARKABLE   Comprehensive metabolic panel     Status: Abnormal   Collection Time: 03/06/17 10:35 PM  Result Value Ref Range   Sodium 158 (H) 135 - 145 mmol/L   Potassium 3.1 (L) 3.5 - 5.1 mmol/L   Chloride 118 (H) 101 - 111 mmol/L   CO2 29 22 - 32 mmol/L   Glucose, Bld 208 (H) 65 - 99 mg/dL   BUN 24 (H) 6 - 20 mg/dL   Creatinine, Ser 0.74 0.44 - 1.00 mg/dL   Calcium 9.1 8.9 - 10.3 mg/dL   Total Protein 6.1 (L) 6.5 - 8.1 g/dL   Albumin 3.1 (L) 3.5 - 5.0 g/dL   AST 24 15 - 41 U/L   ALT 16 14 - 54 U/L   Alkaline Phosphatase 99 38 - 126 U/L   Total Bilirubin 0.8 0.3 - 1.2 mg/dL   GFR calc non Af Amer >60  >60 mL/min   GFR calc Af Amer >60 >60 mL/min    Comment: (NOTE) The eGFR has been calculated using the CKD EPI equation. This calculation has not  been validated in all clinical situations. eGFR's persistently <60 mL/min signify possible Chronic Kidney Disease.    Anion gap 11 5 - 15  Lactic acid, plasma     Status: Abnormal   Collection Time: 03/06/17 10:35 PM  Result Value Ref Range   Lactic Acid, Venous 2.6 (HH) 0.5 - 1.9 mmol/L    Comment: CRITICAL RESULT CALLED TO, READ BACK BY AND VERIFIED WITH HEBRY RIVERA AT 2312 03/06/17 ALV   Hemoglobin A1c     Status: Abnormal   Collection Time: 03/06/17 10:35 PM  Result Value Ref Range   Hgb A1c MFr Bld 5.7 (H) 4.8 - 5.6 %    Comment: (NOTE) Pre diabetes:          5.7%-6.4% Diabetes:              >6.4% Glycemic control for   <7.0% adults with diabetes    Mean Plasma Glucose 116.89 mg/dL    Comment: Performed at [facility] [address] [place], [place] [address]  TSH     Status: None   Collection Time: 03/06/17 10:35 PM  Result Value Ref Range   TSH 1.191 0.350 - 4.500 uIU/mL    Comment: Performed by a 3rd Generation assay with a functional sensitivity of <=0.01 uIU/mL.  Blood Culture (routine x 2)     Status: None (Preliminary result)   Collection Time: 03/06/17 10:36 PM  Result Value Ref Range   Specimen Description BLOOD BLOOD LEFT HAND    Special Requests      BOTTLES DRAWN AEROBIC AND ANAEROBIC Blood Culture results may not be optimal due to an excessive volume of blood received in culture bottles   Culture  Setup Time      GRAM POSITIVE COCCI IN BOTH AEROBIC AND ANAEROBIC BOTTLES CRITICAL RESULT CALLED TO, READ BACK BY AND VERIFIED WITH: MATT MCBANE @ 6045 ON 03/08/2017 BY CAF Performed at [facility], [place] [address] [place], [place] [address]    Culture GRAM POSITIVE COCCI    Report Status PENDING   Blood Culture (routine x 2)     Status: None (Preliminary result)   Collection Time: 03/06/17 10:36 PM   Result Value Ref Range   Specimen Description BLOOD BLOOD RIGHT HAND    Special Requests      BOTTLES DRAWN AEROBIC AND ANAEROBIC Blood Culture adequate volume   Culture NO GROWTH 2 DAYS    Report Status PENDING   Blood Culture ID Panel (Reflexed)     Status: Abnormal   Collection Time: 03/06/17 10:36 PM  Result Value Ref Range   Enterococcus species NOT DETECTED NOT DETECTED   Listeria monocytogenes NOT DETECTED NOT DETECTED   Staphylococcus species DETECTED (A) NOT DETECTED    Comment: Methicillin (oxacillin) resistant coagulase negative staphylococcus. Possible blood culture contaminant (unless isolated from more than one blood culture draw or clinical case suggests pathogenicity). No antibiotic treatment is indicated for blood  culture contaminants. CRITICAL RESULT CALLED TO, READ BACK BY AND VERIFIED WITH: MATT MCBANE @ 1914 ON 03/08/2017 BY CAF    Staphylococcus aureus NOT DETECTED NOT DETECTED   Methicillin resistance DETECTED (A) NOT DETECTED    Comment: CRITICAL RESULT CALLED TO, READ BACK BY AND VERIFIED WITH: MATT MCBANE @ 7829 ON 03/08/2017 BY CAF    Streptococcus species NOT DETECTED NOT DETECTED   Streptococcus agalactiae NOT DETECTED NOT DETECTED   Streptococcus pneumoniae NOT DETECTED NOT DETECTED   Streptococcus pyogenes NOT DETECTED NOT DETECTED  Acinetobacter baumannii NOT DETECTED NOT DETECTED   Enterobacteriaceae species NOT DETECTED NOT DETECTED   Enterobacter cloacae complex NOT DETECTED NOT DETECTED   Escherichia coli NOT DETECTED NOT DETECTED   Klebsiella oxytoca NOT DETECTED NOT DETECTED   Klebsiella pneumoniae NOT DETECTED NOT DETECTED   Proteus species NOT DETECTED NOT DETECTED   Serratia marcescens NOT DETECTED NOT DETECTED   Haemophilus influenzae NOT DETECTED NOT DETECTED   Neisseria meningitidis NOT DETECTED NOT DETECTED   Pseudomonas aeruginosa NOT DETECTED NOT DETECTED   Candida albicans NOT DETECTED NOT DETECTED   Candida glabrata NOT DETECTED  NOT DETECTED   Candida krusei NOT DETECTED NOT DETECTED   Candida parapsilosis NOT DETECTED NOT DETECTED   Candida tropicalis NOT DETECTED NOT DETECTED  MRSA PCR Screening     Status: None   Collection Time: 03/07/17  1:15 AM  Result Value Ref Range   MRSA by PCR NEGATIVE NEGATIVE    Comment:        The GeneXpert MRSA Assay (FDA approved for NASAL specimens only), is one component of a comprehensive MRSA colonization surveillance program. It is not intended to diagnose MRSA infection nor to guide or monitor treatment for MRSA infections.   Glucose, capillary     Status: Abnormal   Collection Time: 03/07/17  1:18 AM  Result Value Ref Range   Glucose-Capillary 163 (H) 65 - 99 mg/dL  Lactic acid, plasma     Status: Abnormal   Collection Time: 03/07/17  1:40 AM  Result Value Ref Range   Lactic Acid, Venous 2.0 (HH) 0.5 - 1.9 mmol/L    Comment: CRITICAL RESULT CALLED TO, READ BACK BY AND VERIFIED WITH SHAUNA HEDRICK AT 0255 03/07/17 ALV   Procalcitonin - Baseline     Status: None   Collection Time: 03/07/17  1:41 AM  Result Value Ref Range   Procalcitonin <0.10 ng/mL    Comment:        Interpretation: PCT (Procalcitonin) <= 0.5 ng/mL: Systemic infection (sepsis) is not likely. Local bacterial infection is possible. (NOTE)         ICU PCT Algorithm               Non ICU PCT Algorithm    ----------------------------     ------------------------------         PCT < 0.25 ng/mL                 PCT < 0.1 ng/mL     Stopping of antibiotics            Stopping of antibiotics       strongly encouraged.               strongly encouraged.    ----------------------------     ------------------------------       PCT level decrease by               PCT < 0.25 ng/mL       >= 80% from peak PCT       OR PCT 0.25 - 0.5 ng/mL          Stopping of antibiotics                                             encouraged.     Stopping of antibiotics           encouraged.     ----------------------------     ------------------------------  PCT level decrease by              PCT >= 0.25 ng/mL       < 80% from peak PCT        AND PCT >= 0.5 ng/mL            Continuin g antibiotics                                              encouraged.       Continuing antibiotics            encouraged.    ----------------------------     ------------------------------     PCT level increase compared          PCT > 0.5 ng/mL         with peak PCT AND          PCT >= 0.5 ng/mL             Escalation of antibiotics                                          strongly encouraged.      Escalation of antibiotics        strongly encouraged.   Basic metabolic panel     Status: Abnormal   Collection Time: 03/07/17 11:21 AM  Result Value Ref Range   Sodium 153 (H) 135 - 145 mmol/L   Potassium 3.7 3.5 - 5.1 mmol/L   Chloride 120 (H) 101 - 111 mmol/L   CO2 26 22 - 32 mmol/L   Glucose, Bld 181 (H) 65 - 99 mg/dL   BUN 19 6 - 20 mg/dL   Creatinine, Ser 0.56 0.44 - 1.00 mg/dL   Calcium 8.1 (L) 8.9 - 10.3 mg/dL   GFR calc non Af Amer >60 >60 mL/min   GFR calc Af Amer >60 >60 mL/min    Comment: (NOTE) The eGFR has been calculated using the CKD EPI equation. This calculation has not been validated in all clinical situations. eGFR's persistently <60 mL/min signify possible Chronic Kidney Disease.    Anion gap 7 5 - 15  Magnesium     Status: None   Collection Time: 03/07/17 11:21 AM  Result Value Ref Range   Magnesium 1.7 1.7 - 2.4 mg/dL  Phosphorus     Status: Abnormal   Collection Time: 03/07/17 11:21 AM  Result Value Ref Range   Phosphorus 2.1 (L) 2.5 - 4.6 mg/dL  Procalcitonin     Status: None   Collection Time: 03/08/17  4:27 AM  Result Value Ref Range   Procalcitonin <0.10 ng/mL    Comment:        Interpretation: PCT (Procalcitonin) <= 0.5 ng/mL: Systemic infection (sepsis) is not likely. Local bacterial infection is possible. (NOTE)         ICU PCT Algorithm                Non ICU PCT Algorithm    ----------------------------     ------------------------------         PCT < 0.25 ng/mL                 PCT < 0.1 ng/mL     Stopping of antibiotics  Stopping of antibiotics       strongly encouraged.               strongly encouraged.    ----------------------------     ------------------------------       PCT level decrease by               PCT < 0.25 ng/mL       >= 80% from peak PCT       OR PCT 0.25 - 0.5 ng/mL          Stopping of antibiotics                                             encouraged.     Stopping of antibiotics           encouraged.    ----------------------------     ------------------------------       PCT level decrease by              PCT >= 0.25 ng/mL       < 80% from peak PCT        AND PCT >= 0.5 ng/mL            Continuin g antibiotics                                              encouraged.       Continuing antibiotics            encouraged.    ----------------------------     ------------------------------     PCT level increase compared          PCT > 0.5 ng/mL         with peak PCT AND          PCT >= 0.5 ng/mL             Escalation of antibiotics                                          strongly encouraged.      Escalation of antibiotics        strongly encouraged.   Basic metabolic panel     Status: Abnormal   Collection Time: 03/08/17  4:27 AM  Result Value Ref Range   Sodium 150 (H) 135 - 145 mmol/L   Potassium 3.1 (L) 3.5 - 5.1 mmol/L   Chloride 116 (H) 101 - 111 mmol/L   CO2 29 22 - 32 mmol/L   Glucose, Bld 127 (H) 65 - 99 mg/dL   BUN 17 6 - 20 mg/dL   Creatinine, Ser 0.48 0.44 - 1.00 mg/dL   Calcium 8.4 (L) 8.9 - 10.3 mg/dL   GFR calc non Af Amer >60 >60 mL/min   GFR calc Af Amer >60 >60 mL/min    Comment: (NOTE) The eGFR has been calculated using the CKD EPI equation. This calculation has not been validated in all clinical situations. eGFR's persistently <60 mL/min signify possible Chronic  Kidney Disease.    Anion gap 5 5 - 15  Magnesium     Status: None   Collection  Time: 03/08/17  4:27 AM  Result Value Ref Range   Magnesium 2.4 1.7 - 2.4 mg/dL  Phosphorus     Status: Abnormal   Collection Time: 03/08/17  4:27 AM  Result Value Ref Range   Phosphorus 1.9 (L) 2.5 - 4.6 mg/dL  CBC     Status: Abnormal   Collection Time: 03/08/17  4:27 AM  Result Value Ref Range   WBC 15.2 (H) 3.6 - 11.0 K/uL   RBC 3.61 (L) 3.80 - 5.20 MIL/uL   Hemoglobin 12.3 12.0 - 16.0 g/dL   HCT 37.4 35.0 - 47.0 %   MCV 103.7 (H) 80.0 - 100.0 fL   MCH 34.2 (H) 26.0 - 34.0 pg   MCHC 33.0 32.0 - 36.0 g/dL   RDW 14.5 11.5 - 14.5 %   Platelets 167 150 - 440 K/uL   No components found for: ESR, C REACTIVE PROTEIN MICRO: Recent Results (from the past 720 hour(s))  Blood Culture (routine x 2)     Status: None (Preliminary result)   Collection Time: 03/06/17 10:36 PM  Result Value Ref Range Status   Specimen Description BLOOD BLOOD LEFT HAND  Final   Special Requests   Final    BOTTLES DRAWN AEROBIC AND ANAEROBIC Blood Culture results may not be optimal due to an excessive volume of blood received in culture bottles   Culture  Setup Time   Final    GRAM POSITIVE COCCI IN BOTH AEROBIC AND ANAEROBIC BOTTLES CRITICAL RESULT CALLED TO, READ BACK BY AND VERIFIED WITH: MATT MCBANE @ 3664 ON 03/08/2017 BY CAF Performed at [facility], [address] [place], [place] [address]    Culture GRAM POSITIVE COCCI  Final   Report Status PENDING  Incomplete  Blood Culture (routine x 2)     Status: None (Preliminary result)   Collection Time: 03/06/17 10:36 PM  Result Value Ref Range Status   Specimen Description BLOOD BLOOD RIGHT HAND  Final   Special Requests   Final    BOTTLES DRAWN AEROBIC AND ANAEROBIC Blood Culture adequate volume   Culture NO GROWTH 2 DAYS  Final   Report Status PENDING  Incomplete  Blood Culture ID Panel (Reflexed)     Status: Abnormal   Collection Time: 03/06/17 10:36 PM   Result Value Ref Range Status   Enterococcus species NOT DETECTED NOT DETECTED Final   Listeria monocytogenes NOT DETECTED NOT DETECTED Final   Staphylococcus species DETECTED (A) NOT DETECTED Final    Comment: Methicillin (oxacillin) resistant coagulase negative staphylococcus. Possible blood culture contaminant (unless isolated from more than one blood culture draw or clinical case suggests pathogenicity). No antibiotic treatment is indicated for blood  culture contaminants. CRITICAL RESULT CALLED TO, READ BACK BY AND VERIFIED WITH: MATT MCBANE @ 4259 ON 03/08/2017 BY CAF    Staphylococcus aureus NOT DETECTED NOT DETECTED Final   Methicillin resistance DETECTED (A) NOT DETECTED Final    Comment: CRITICAL RESULT CALLED TO, READ BACK BY AND VERIFIED WITH: MATT MCBANE @ 5638 ON 03/08/2017 BY CAF    Streptococcus species NOT DETECTED NOT DETECTED Final   Streptococcus agalactiae NOT DETECTED NOT DETECTED Final   Streptococcus pneumoniae NOT DETECTED NOT DETECTED Final   Streptococcus pyogenes NOT DETECTED NOT DETECTED Final   Acinetobacter baumannii NOT DETECTED NOT DETECTED Final   Enterobacteriaceae species NOT DETECTED NOT DETECTED Final   Enterobacter cloacae complex NOT DETECTED NOT DETECTED Final   Escherichia coli NOT DETECTED NOT DETECTED Final   Klebsiella  oxytoca NOT DETECTED NOT DETECTED Final   Klebsiella pneumoniae NOT DETECTED NOT DETECTED Final   Proteus species NOT DETECTED NOT DETECTED Final   Serratia marcescens NOT DETECTED NOT DETECTED Final   Haemophilus influenzae NOT DETECTED NOT DETECTED Final   Neisseria meningitidis NOT DETECTED NOT DETECTED Final   Pseudomonas aeruginosa NOT DETECTED NOT DETECTED Final   Candida albicans NOT DETECTED NOT DETECTED Final   Candida glabrata NOT DETECTED NOT DETECTED Final   Candida krusei NOT DETECTED NOT DETECTED Final   Candida parapsilosis NOT DETECTED NOT DETECTED Final   Candida tropicalis NOT DETECTED NOT DETECTED Final   MRSA PCR Screening     Status: None   Collection Time: 03/07/17  1:15 AM  Result Value Ref Range Status   MRSA by PCR NEGATIVE NEGATIVE Final    Comment:        The GeneXpert MRSA Assay (FDA approved for NASAL specimens only), is one component of a comprehensive MRSA colonization surveillance program. It is not intended to diagnose MRSA infection nor to guide or monitor treatment for MRSA infections.     IMAGING: Dg Chest 1 View  Result Date: 03/08/2017 CLINICAL DATA:  Hypoxia.  COPD.  Smoker. EXAM: CHEST 1 VIEW COMPARISON:  03/06/2017 FINDINGS: The heart size and mediastinal contours are within normal limits. Aortic atherosclerosis. Both lungs are clear. The visualized skeletal structures are unremarkable. IMPRESSION: No active disease. Electronically Signed   By: Earle Gell M.D.   On: 03/08/2017 08:43   Dg Chest Portable 1 View  Result Date: 03/06/2017 CLINICAL DATA:  Respiratory distress EXAM: PORTABLE CHEST 1 VIEW COMPARISON:  11/11/2016 FINDINGS: No acute consolidation or effusion. Cardiomediastinal silhouette within normal limits. Aortic atherosclerosis. No pneumothorax. IMPRESSION: No active disease. Electronically Signed   By: Donavan Foil M.D.   On: 03/06/2017 23:12    Assessment:   Mardi M Rosado is a 71 y.o. female with CVA, on hospice at [facility] admitted with resp distress.  Placed on Bipap. On admit wbc elevated and temp 100.7. BCX + 1/2 staph species.  Received dose of cefepime then vanco. PC neg. CXR neg No fevers, no obvious infection at this time with work up negative.  I suspected CNS in bcx is contaminant.  Recommendations DC abx and monitor for fevers, or other signs of infection.  No need to further work up the CNS bacteremia Thank you very much for allowing me to participate in the care of this patient. Please call with questions.   Cheral Marker. Ola Spurr, MD

## 2017-03-08 NOTE — Progress Notes (Signed)
Family Meeting Note  Advance Directive:yes  Today a meeting took place with the 2 daughters.  Patient is unable to participate due ZO:[id number]:Lacked capacity aphasic   The following clinical team members were present during this meeting:MD  The following were discussed:Patient's diagnosis: stroke, aphasia, Dyshagia, sepsis , Patient's progosis: < 3 months and Goals for treatment: DNR  I had detailed discussion with 2 of her daughters, [place] at [place], contact number [phone] is her healthcare power of attorney. I also explained her in detail about the meaning of hospice care, as she was never educated on that. Discussed with her about expectations and possible outcome in this type of condition including repeated admissions, increased risk of aspirations and infections,malnutrition, dehydration etc., and explain her what it means to be followed by hospice at nursing home. She will discuss with her other siblings about this and they will come the decision this time if she improves and goes back to the nursing home then to let hospice follow there and not to be transferred to hospital in future for other events.  Time spent during discussion:30 minutes  Altamese DillingVACHHANI, Khalid Lacko, MD

## 2017-03-08 NOTE — Progress Notes (Signed)
Initial Nutrition Assessment  DOCUMENTATION CODES:   Not applicable  INTERVENTION:   Magic cup TID with meals, each supplement provides 290 kcal and 9 grams of protein  Honey Thick Mighty Shake TID, each supplement provides 200kcal and 7g protein   NUTRITION DIAGNOSIS:   Unintentional weight loss related to  (COPD, CVA, dysphagia) as evidenced by 13 percent weight loss in 4 months.  GOAL:   Patient will meet greater than or equal to 90% of their needs  MONITOR:   PO intake, Supplement acceptance, Labs, Weight trends  REASON FOR ASSESSMENT:   Low Braden    ASSESSMENT:   71 y/o female with a past medical history of COPD, hypertension and recent stroke presents emergency department from her nursing home in respiratory distress. Found to have septic shock   [facility]. Visited pt's room today. 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 Pt was placed on comfort measures at that time. Unable to obtain history from pt as pt with aphasia. Per chart, pt with 22lb(13%) wt loss in 4 months; this is significant. RD suspects pt with poor oral intake pta. Pt may benefit from PEG placement to help meet estimated needs as pt with continuous wt loss since stroke. PEG placement may not be in line with pt's goals of care. Palliative care consult pending. Pt s/p CSE and approved for DYS 1/ honey thick diet. RD will order supplements. Pt with hypophosphatemia and hypokalemia; monitor and supplement as needed per MD discretion.      Medications reviewed and include: aspirin, colace, lovenox, solu-medrol, KPhos, Dex 5% @ 6575ml/hr, vancomycin  Labs reviewed: Na 150(H), K 3.1(L), Cl 116(H), Ca 8.4(L), P 1.9(L), Mg 2.4 wnl Wbc- 15.2(H) cbgs- 208, 181, 127 x 48hrs  Nutrition-Focused physical exam completed. Findings are no fat depletion, no muscle depletion, and no edema.   Diet Order:  DIET - DYS 1  Room service appropriate? Yes with Assist; Fluid consistency: Honey Thick  Skin:  Wound (see comment) (deep tissue injury heel, Stage II ischeum )  Last BM:  9/5- Type 6  Height:   Ht Readings from Last 1 Encounters:  03/07/17 5\' 5"  (1.651 m)    Weight:   Wt Readings from Last 1 Encounters:  03/08/17 144 lb (65.3 kg)    Ideal Body Weight:  56.8 kg  BMI:  Body mass index is 23.96 kg/m.  Estimated Nutritional Needs:   Kcal:  1600-1800kcal/day   Protein:  72-85g/day   Fluid:  >1.6L/day   EDUCATION NEEDS:   Education needs no appropriate at this time  Betsey Holidayasey Alexina Niccoli MS, RD, LDN Pager #[phone]- [phone] After Hours Pager: [phone]

## 2017-03-08 NOTE — Progress Notes (Signed)
Pt alert and follows most commands. Pt indicates she is thirsty and hungry. Message to MD regarding speech eval to determine diet order.  VSS, O2 100 on ra. No pain.

## 2017-03-08 NOTE — Evaluation (Signed)
Clinical/Bedside Swallow Evaluation Patient Details  Name: Diana Mccoy MRN: [id number] Date of Birth: 1945-11-09  Today's Date: 03/08/2017 Time: SLP Start Time (ACUTE ONLY): 0915 SLP Stop Time (ACUTE ONLY): 1015 SLP Time Calculation (min) (ACUTE ONLY): 60 min  Past Medical History:  Past Medical History:  Diagnosis Date  . Acid reflux 12/28/2014  . 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 Anxiety 12/28/2014  . BP (high blood pressure) 12/28/2014  . CAFL (chronic airflow limitation) (HCC) 12/28/2014  . COPD (chronic obstructive pulmonary disease) (HCC) 01/19/2016  . Depression 03/28/2015  . 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 Disorder of kidney 12/28/2014  . Gait instability 04/18/2015  . Osteoarthritis 03/28/2015  . 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 Schizophrenia (HCC) 03/28/2015  . Scoliosis 12/28/2014  . Spinal stenosis 12/28/2014   Past Surgical History:  Past Surgical History:  Procedure Laterality Date  . HIP FRACTURE SURGERY Left    HPI:  Pt is a 71 y/o female w/ PMH including bipolar dis., schizophrenia, acid reflux, presbyesophagus, COPD, hypertension and recent CVA/stroke([facility]) presented to the emergency department from her nursing home in respiratory distress. The patient is unable to provide history as she appears to be dysarthric. BiPAP is also in place making it difficult for her to communicate but she responds to verbal stimuli. Chest x-ray showed no active disease. However laboratory evaluation significant for hypernatremia as well as low potassium and lactic acidosis. The patient also had a significant leukocytosis of 24.8. The emergency department initiated septis protocol. The patient's blood pressure was also as low as mid 80s systolic which prompted aggressive fluid resuscitation to which the patient responded positively. Unsure of pt's baseline swallowing status.   Assessment / Plan / Recommendation Clinical Impression  Pt appears to present w/  moderate+ oropharyngeal phase dysphagia w/ increased risk for aspiration w/ po's. Pt was given several trial consistencies and appeared to adequately tolerate trials of Honey consistency liquid by TSP and tsp trials of puree when following aspiration precautions - no immediate, overt s/s of aspiration noted w/ the po trials of those consistencies. Pt does appear to exhibit a delayed pharyngeal swallow initiation and decreased oral awareness w/ bolus consistencies in general. Given support, pt was able to swallow and clear these trial consistencies adequately for recommendation of diet of such. However, when given tsp trials of thin and Nectar consistency liquids, pt exhibited overt s/s of aspiration - fairly immediate coughing. This suspected aspiration could negatively impact pulmonary status and increased risk for aspiration pneumonia to occur. OM exam revealed Left labial weakness; slow OM movements in general w/ bolus management. Suspect this could be d/t recent CVA(?). Due to pt's increased risk for aspiration, recommend initiation of a dysphagia level 1 diet w/ Honey consistency liquids w/ strict aspiration precautions; feeding support; pills in Puree - Crushed as able. ST services will f/u w/ toleration of po's and ongoing assessment of status. NSG/MD/Dietician updated, agreed.  SLP Visit Diagnosis: Dysphagia, oropharyngeal phase (R13.12)    Aspiration Risk  Moderate aspiration risk    Diet Recommendation  Dysphagia level 1 w/ Honey consistency liquids; strict aspiration precautions; reflux precautions; feeding support at meals  Medication Administration: Crushed with puree    Other  Recommendations Recommended Consults:  (Dietician f/u; followed by Hospice already) Oral Care Recommendations: Oral care BID;Staff/trained caregiver to provide oral care Other Recommendations: Order thickener from pharmacy;Prohibited food (jello, ice cream, thin soups);Remove water pitcher;Have oral suction available    Follow up Recommendations Skilled Nursing  facility      Frequency and Duration min 3x week  2 weeks       Prognosis Prognosis for Safe Diet Advancement: Guarded Barriers to Reach Goals: Cognitive deficits;Severity of deficits      Swallow Study   General Date of Onset: 03/06/17 HPI: Pt is a 71 y/o female w/ PMH including bipolar dis., schizophrenia, acid reflux, presbyesophagus, COPD, hypertension and recent CVA/stroke([facility]) presented to the emergency department from her nursing home in respiratory distress. The patient is unable to provide history as she appears to be dysarthric. BiPAP is also in place making it difficult for her to communicate but she responds to verbal stimuli. Chest x-ray showed no active disease. However laboratory evaluation significant for hypernatremia as well as low potassium and lactic acidosis. The patient also had a significant leukocytosis of 24.8. The emergency department initiated septis protocol. The patient's blood pressure was also as low as mid 80s systolic which prompted aggressive fluid resuscitation to which the patient responded positively. Unsure of pt's baseline swallowing status. Type of Study: Bedside Swallow Evaluation Previous Swallow Assessment: unknown Diet Prior to this Study: NPO Temperature Spikes Noted: No (wbc 15.2) Respiratory Status: Room air History of Recent Intubation: No Behavior/Cognition: Alert;Cooperative;Pleasant mood;Confused;Requires cueing;Distractible (mostly nonverbal but attempted few words) Oral Cavity Assessment: Dry Oral Care Completed by SLP: Recent completion by staff Oral Cavity - Dentition: Adequate natural dentition Vision:  (n/a) Self-Feeding Abilities: Total assist Patient Positioning: Upright in bed Baseline Vocal Quality: Low vocal intensity (muttered/mumbled few words) Volitional Cough: Cognitively unable to elicit Volitional Swallow: Unable to elicit    Oral/Motor/Sensory Function Overall Oral  Motor/Sensory Function: Moderate impairment Facial ROM: Reduced left;Suspected CN VII (facial) dysfunction Facial Symmetry: Abnormal symmetry left;Suspected CN VII (facial) dysfunction Facial Strength: Reduced left;Suspected CN VII (facial) dysfunction Lingual ROM: Within Functional Limits (fair, grossly wfl w/ bolus management/accepting) Lingual Symmetry: Within Functional Limits (same) Lingual Strength: Within Functional Limits (for bolus management) Velum:  (CNT ) Mandible: Within Functional Limits (adequate)   Ice Chips Ice chips: Impaired Presentation: Spoon (fed; 2 trials) Oral Phase Impairments: Poor awareness of bolus;Reduced labial seal;Impaired mastication Oral Phase Functional Implications: Prolonged oral transit;Oral holding Pharyngeal Phase Impairments: Suspected delayed Swallow;Cough - Immediate (x2/2)   Thin Liquid Thin Liquid: Impaired Presentation: Spoon (fed; 1 trial) Oral Phase Impairments: Reduced labial seal;Poor awareness of bolus Oral Phase Functional Implications:  (slower movements) Pharyngeal  Phase Impairments: Suspected delayed Swallow;Decreased hyoid-laryngeal movement;Cough - Immediate    Nectar Thick Nectar Thick Liquid: Impaired Presentation: Spoon (fed; 3 trials) Oral Phase Impairments: Reduced labial seal;Poor awareness of bolus Oral phase functional implications: Prolonged oral transit Pharyngeal Phase Impairments: Suspected delayed Swallow;Cough - Delayed (x1/3)   Honey Thick Honey Thick Liquid: Impaired Oral Phase Impairments: Poor awareness of bolus;Reduced labial seal Oral Phase Functional Implications: Prolonged oral transit Pharyngeal Phase Impairments: Suspected delayed Swallow (no overt cough)   Puree Puree: Impaired Presentation: Spoon (fed; 8 trials) Oral Phase Impairments: Reduced labial seal;Poor awareness of bolus Oral Phase Functional Implications: Prolonged oral transit Pharyngeal Phase Impairments:  (none noted)   Solid   GO    Solid: Not tested    Functional Assessment Tool Used: clinical judgement Functional Limitations: Swallowing Swallow Current Status ([id number]): At least 60 percent but less than 80 percent impaired, limited or restricted Swallow Goal Status [phone]): At least 60 percent but less than 80 percent impaired, limited or restricted Swallow Discharge Status [phone]): At least 60 percent but less than 80 percent impaired, limited or restricted  Jerilynn SomKatherine Watson, MS, CCC-SLP Watson,Katherine 03/08/2017,3:10 PM

## 2017-03-08 NOTE — Progress Notes (Signed)
PHARMACY - PHYSICIAN COMMUNICATION CRITICAL VALUE ALERT - BLOOD CULTURE IDENTIFICATION (BCID)  Results for orders placed or performed during the hospital encounter of 03/06/17  Blood Culture ID Panel (Reflexed) (Collected: 03/06/2017 10:36 PM)  Result Value Ref Range   Enterococcus species NOT DETECTED NOT DETECTED   Listeria monocytogenes NOT DETECTED NOT DETECTED   Staphylococcus species DETECTED (A) NOT DETECTED   Staphylococcus aureus NOT DETECTED NOT DETECTED   Methicillin resistance DETECTED (A) NOT DETECTED   Streptococcus species NOT DETECTED NOT DETECTED   Streptococcus agalactiae NOT DETECTED NOT DETECTED   Streptococcus pneumoniae NOT DETECTED NOT DETECTED   Streptococcus pyogenes NOT DETECTED NOT DETECTED   Acinetobacter baumannii NOT DETECTED NOT DETECTED   Enterobacteriaceae species NOT DETECTED NOT DETECTED   Enterobacter cloacae complex NOT DETECTED NOT DETECTED   Escherichia coli NOT DETECTED NOT DETECTED   Klebsiella oxytoca NOT DETECTED NOT DETECTED   Klebsiella pneumoniae NOT DETECTED NOT DETECTED   Proteus species NOT DETECTED NOT DETECTED   Serratia marcescens NOT DETECTED NOT DETECTED   Haemophilus influenzae NOT DETECTED NOT DETECTED   Neisseria meningitidis NOT DETECTED NOT DETECTED   Pseudomonas aeruginosa NOT DETECTED NOT DETECTED   Candida albicans NOT DETECTED NOT DETECTED   Candida glabrata NOT DETECTED NOT DETECTED   Candida krusei NOT DETECTED NOT DETECTED   Candida parapsilosis NOT DETECTED NOT DETECTED   Candida tropicalis NOT DETECTED NOT DETECTED    Name of physician (or Provider) ContactedSheryle Hail: Diamond  Changes to prescribed antibiotics required: n/a  Barack Nicodemus S 03/08/2017  4:49 AM

## 2017-03-08 NOTE — Progress Notes (Signed)
Follow up on current hospice patient admitted on 9.5.18 with sepsis.  Patient transferred from CCU to room 150.  Patient pending palliative medicine consult, speech consult and ID consult. No plans for discharge at this time.  Will continue to follow patient through final disposition.  Norris CrossKara H. Marshall RN

## 2017-03-08 NOTE — NC FL2 (Signed)
[place] MEDICAID FL2 LEVEL OF CARE SCREENING TOOL     IDENTIFICATION  Patient Name: Diana Mccoy Birthdate: 01/12/1946 Sex: female Admission Date (Current Location): 03/06/2017  [place]   Facility and Address:  [facility], [address], [place], [place] [address]      Provider Number: [id number]  Attending Physician Name and Address:  Altamese DillingVachhani, Annalee Meyerhoff, *  Relative Name and Phone Number:       Current Level of Care: Hospital Recommended Level of Care: Skilled Nursing Facility Prior Approval Number:    Date Approved/Denied:   PASRR Number:  [id number] )  Discharge Plan: SNF    Current Diagnoses: Patient Active Problem List   Diagnosis Date Noted  . Pressure injury of skin 03/08/2017  . Septic shock (HCC) 03/07/2017  . 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 COPD (chronic obstructive pulmonary disease) (HCC) 01/19/2016  . Gait instability 04/18/2015  . Schizophrenia (HCC) 03/28/2015  . Depression 03/28/2015  . Osteoarthritis 03/28/2015  . Elevated WBC count 01/31/2015  . Anxiety 12/28/2014  . 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 CAFL (chronic airflow limitation) (HCC) 12/28/2014  . 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 Acid reflux 12/28/2014  . BP (high blood pressure) 12/28/2014  . Disorder of kidney 12/28/2014  . Scoliosis 12/28/2014  . Spinal stenosis 12/28/2014    Orientation RESPIRATION BLADDER Height & Weight     Self  Normal Continent Weight: 144 lb (65.3 kg) Height:  5\' 5"  (165.1 cm)  BEHAVIORAL SYMPTOMS/MOOD NEUROLOGICAL BOWEL NUTRITION STATUS      Incontinent Diet (Dysphagia level 1(puree) w/ HONEY consistency liquids by TSP, small sip; Strict Aspiration precautions and feeding support at all meals. Pills in Puree - CRUSHED as able. )  AMBULATORY STATUS COMMUNICATION OF NEEDS Skin   Extensive Assist Verbally PU Stage and Appropriate Care (Pressure Ulcer on left heel. )                       Personal Care Assistance Level of Assistance  Bathing, Feeding, Dressing Bathing Assistance: Limited assistance Feeding assistance: Limited assistance Dressing Assistance: Limited assistance     Functional Limitations Info  Sight, Hearing, Speech Sight Info: Adequate Hearing Info: Adequate Speech Info: Adequate    SPECIAL CARE FACTORS FREQUENCY   (Folloed by [facility]. )                    Contractures      Additional Factors Info  Code Status, Allergies Code Status Info:  (DNR ) Allergies Info:  (Ciprofloxacin, Penicillins, Sodium Thiosulfate, Sulfa Antibiotics)           Current Medications (03/08/2017):  This is the current hospital active medication list Current Facility-Administered Medications  Medication Dose Route Frequency Provider Last Rate Last Dose  . acetaminophen (TYLENOL) tablet 650 mg  650 mg Oral Q6H PRN Arnaldo Nataliamond, Michael S, MD       Or  . acetaminophen (TYLENOL) suppository 650 mg  650 mg Rectal Q6H PRN Arnaldo Nataliamond, Michael S, MD      . aspirin EC tablet 81 mg  81 mg Oral Daily Arnaldo Nataliamond, Michael S, MD   81 mg at 03/08/17 0946  . bisacodyl (DULCOLAX) suppository 10 mg  10 mg Rectal Daily PRN Arnaldo Nataliamond, Michael S, MD      . dextrose 5 % 1,000 mL with potassium chloride 40 mEq infusion   Intravenous Continuous Altamese DillingVachhani, Fayelynn Distel, MD 75 mL/hr at 03/08/17 1500    .  docusate sodium (COLACE) capsule 100 mg  100 mg Oral BID Arnaldo Natal, MD   100 mg at 03/07/17 1224  . enoxaparin (LOVENOX) injection 40 mg  40 mg Subcutaneous Q24H Arnaldo Natal, MD   40 mg at 03/07/17 2027  . fentaNYL (DURAGESIC - dosed mcg/hr) 12.5 mcg  12.5 mcg Transdermal Q72H Arnaldo Natal, MD   Stopped at 03/07/17 [phone]  . ipratropium-albuterol (DUONEB) 0.5-2.5 (3) MG/3ML nebulizer solution 3 mL  3 mL Nebulization Q6H Altamese Dilling, MD   3 mL at 03/08/17 1407  . ipratropium-albuterol (DUONEB) 0.5-2.5 (3) MG/3ML nebulizer solution 3 mL  3 mL  Nebulization Q4H PRN Altamese Dilling, MD      . LORazepam (ATIVAN) tablet 0.5 mg  0.5 mg Oral Daily Arnaldo Natal, MD   0.5 mg at 03/08/17 0946  . methylPREDNISolone sodium succinate (SOLU-MEDROL) 40 mg/mL injection 20 mg  20 mg Intravenous Daily Erin Fulling, MD   20 mg at 03/08/17 0948  . ondansetron (ZOFRAN) tablet 4 mg  4 mg Oral Q6H PRN Arnaldo Natal, MD       Or  . ondansetron [facility]) injection 4 mg  4 mg Intravenous Q6H PRN Arnaldo Natal, MD      . phosphorus (K PHOS NEUTRAL) tablet 500 mg  500 mg Oral Q4H Cindi Carbon, RPH   500 mg at 03/08/17 5409  . sertraline (ZOLOFT) tablet 50 mg  50 mg Oral Daily Arnaldo Natal, MD   50 mg at 03/08/17 0947  . sodium chloride 0.9 % bolus 1,000 mL  1,000 mL Intravenous Once Phineas Semen, MD       And  . sodium chloride 0.9 % bolus 250 mL  250 mL Intravenous Once Phineas Semen, MD      . vancomycin (VANCOCIN) IVPB 1000 mg/200 mL premix  1,000 mg Intravenous Once Cindi Carbon, RPH       Followed by  . vancomycin (VANCOCIN) IVPB 1000 mg/200 mL premix  1,000 mg Intravenous Q18H Cindi Carbon, [facility]         Discharge Medications: Please see discharge summary for a list of discharge medications.  Relevant Imaging Results:  Relevant Lab Results:   Additional Information  (SSN: [id number])  Sample, Darleen Crocker, LCSW

## 2017-03-08 NOTE — Progress Notes (Signed)
Pt transferred from CCU. During skin assessment deep tissue injury to left heel and stage II pressure ulcer was noted on ischiem.

## 2017-03-08 NOTE — Progress Notes (Addendum)
MEDICATION RELATED CONSULT NOTE - FOLLOW UP   Pharmacy Consult for Electrolyte Monitoring Indication: hypokalemia, hypomagnesemia, hypophosphatemia  Allergies  Allergen Reactions  . Ciprofloxacin Nausea And Vomiting  . Penicillins     rash, hives  . Sodium Thiosulfate   . Sulfa Antibiotics     Patient Measurements: Height: 5\' 5"  (165.1 cm) Weight: 144 lb (65.3 kg) IBW/kg (Calculated) : 57  Labs:  Recent Labs  03/06/17 2235 03/07/17 1121 03/08/17 0427  WBC 24.8*  --  15.2*  HGB 13.8  --  12.3  HCT 43.2  --  37.4  PLT 327  --  167  CREATININE 0.74 0.56 0.48  MG  --  1.7 2.4  PHOS  --  2.1* 1.9*  ALBUMIN 3.1*  --   --   PROT 6.1*  --   --   AST 24  --   --   ALT 16  --   --   ALKPHOS 99  --   --   BILITOT 0.8  --   --    Assessment: Mg = 2.4, K = 3.1, Phos = 1.9  Plan: Order KCl 40 mEq IV  K-Phos Neutra 500 mg PO every 4 hours x 4 doses  Will recheck K this evening and all other electrolytes with AM labs tomorrow.  ADD: K= 3.7. No further supplementation warranted @ this time,   Demetrius Charityeldrin D. Celica Kotowski, PharmD  Clinical Pharmacist 03/08/2017,8:50 AM

## 2017-03-08 NOTE — Progress Notes (Signed)
[employer] - [place] at [facility]   PATIENT NAME: Diana Mccoy    MR#:  [id number]  DATE OF BIRTH:  [birth year]  SUBJECTIVE:  CHIEF COMPLAINT:   Chief Complaint  Patient presents with  . Respiratory Distress  recent stroke, nonverbal, sent with sepsis from NH, not able to give any hx or symptoms. Initially required Bipap , now only at night. More alert today.  REVIEW OF SYSTEMS:  Pt is nonverbal.  ROS  DRUG ALLERGIES:   Allergies  Allergen Reactions  . Ciprofloxacin Nausea And Vomiting  . Penicillins     rash, hives  . Sodium Thiosulfate   . Sulfa Antibiotics     VITALS:  Blood pressure (!) 124/45, pulse 82, temperature 98.7 F (37.1 C), temperature source Axillary, resp. rate 18, height  (1.651 m), weight 65.3 kg (144 lb), SpO2 99 %.  PHYSICAL EXAMINATION:  GENERAL:  71 y.o.-year-old patient lying in the bed with no acute distress.  EYES: Pupils equal, round, reactive to light and accommodation. No scleral icterus. Extraocular muscles intact.  HEENT: Head atraumatic, normocephalic. Oropharynx and nasopharynx clear.  NECK:  Supple, no jugular venous distention. No thyroid enlargement, no tenderness.  LUNGS: Normal breath sounds bilaterally, some wheezing, some crepitation. No use of accessory muscles of respiration.  CARDIOVASCULAR: S1, S2 normal. No murmurs, rubs, or gallops.  ABDOMEN: Soft, nontender, nondistended. Bowel sounds present. No organomegaly or mass.  EXTREMITIES: No pedal edema, cyanosis, or clubbing.  NEUROLOGIC: Cranial nerves II through XII are intact. Muscle strength 3/5 right side and 0/5 left side, follows commands. Gait not checked. Nonverbal,  PSYCHIATRIC: The patient is alert and orientation can not be checked , nonverbal.  SKIN: No obvious rash, lesion, or ulcer.   Physical Exam LABORATORY PANEL:   CBC  Recent Labs Lab 03/08/17 0427  WBC 15.2*  HGB 12.3  HCT 37.4  PLT 167    ------------------------------------------------------------------------------------------------------------------  Chemistries   Recent Labs Lab 03/06/17 2235  03/08/17 0427  NA 158*  < > 150*  K 3.1*  < > 3.1*  CL 118*  < > 116*  CO2 29  < > 29  GLUCOSE 208*  < > 127*  BUN 24*  < > 17  CREATININE 0.74  < > 0.48  CALCIUM 9.1  < > 8.4*  MG  --   < > 2.4  AST 24  --   --   ALT 16  --   --   ALKPHOS 99  --   --   BILITOT 0.8  --   --   < > = values in this interval not displayed. ------------------------------------------------------------------------------------------------------------------  Cardiac Enzymes No results for input(s): TROPONINI in the last 168 hours. ------------------------------------------------------------------------------------------------------------------  RADIOLOGY:  Dg Chest 1 View  Result Date: 03/08/2017 CLINICAL DATA:  Hypoxia.  COPD.  Smoker. EXAM: CHEST 1 VIEW COMPARISON:  03/06/2017 FINDINGS: The heart size and mediastinal contours are within normal limits. Aortic atherosclerosis. Both lungs are clear. The visualized skeletal structures are unremarkable. IMPRESSION: No active disease. Electronically Signed   By: Myles Rosenthal M.D.   On: 03/08/2017 08:43   Dg Chest Portable 1 View  Result Date: 03/06/2017 CLINICAL DATA:  Respiratory distress EXAM: PORTABLE CHEST 1 VIEW COMPARISON:  11/11/2016 FINDINGS: No acute consolidation or effusion. Cardiomediastinal silhouette within normal limits. Aortic atherosclerosis. No pneumothorax. IMPRESSION: No active disease. Electronically Signed   By: Jasmine Pang M.D.   On: 03/06/2017 23:12    ASSESSMENT AND PLAN:   Active  Problems:   Septic shock (HCC)   Pressure injury of skin  This is a 71 year old female admitted for septic shock. 1. Septic shock: The patient is criteria for sepsis via tachycardia, tachypnea, leukocytosis and fever. Initially she was hypotensive but has been fluid responsive. Source is  unknown at this time- likely bronchitis. Continue broad-spectrum antibiotics. Follow blood cultures for growth and sensitivities.   As procalcitonin is low, Intensivist suggested to stop Abx.   But UA was never sent, So I ordered and sent today.   Bl cx have staph species- started on vanc again. 2. Respiratory failure with hypoxemia: COPD exacerbation  Continue BiPAP. Supply with oxygen as needed. Now off bipap 3. COPD exacerbation: The patient is received Solu-Medrol. Continue scheduled duonebs. 4. DVT prophylaxis: Lovenox 5. GI prophylaxis: None 6. Recent stroke in Diana Mccoy 2018- dysphagia.   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    Swallow evaluation done here, suggested dysphagia and pure diet.  All the records are reviewed and case discussed with Care Management/Social Workerr. Management plans discussed with the patient, family and they are in agreement.  CODE STATUS: DNR  TOTAL TIME TAKING CARE OF THIS PATIENT: 35 minutes.   I had detailed discussion with 2 of her daughters, [place] at [place], contact number [phone] is her healthcare power of attorney. I also explained her in detail about the meaning of hospice care, as she was never educated on that. Discussed with her about expectations and possible outcome in this type of condition including repeated admissions, increased risk of aspirations and infections,malnutrition, dehydration etc., and explain her what it means to be followed by hospice at nursing home. She will discuss with her other siblings about this and they will come the decision this time if she improves and goes back to the nursing home then to let hospice follow there and not to be transferred to hospital in future for other events.  POSSIBLE D/C IN 1-2 DAYS,  DEPENDING ON CLINICAL CONDITION.   Diana Mccoy, Sharis Keeran M.D on 03/08/2017   Between 7am to 6pm - Pager - [phone]  After 6pm go to [email or web address] - password [email or web address]  [facility]  Office  [phone]  CC: Primary care physician; Karie SchwalbeLetvak, Richard I, MD  Note: This dictation was prepared with Dragon dictation along with smaller phrase technology. Any transcriptional errors that result from this process are unintentional.

## 2017-03-08 NOTE — Clinical Social Work Note (Signed)
Clinical Social Work Assessment  Patient Details  Name: Diana Mccoy MRN: [id number] Date of Birth: 08/22/1945  Date of referral:  03/08/17               Reason for consult:  Other (Comment Required) ([facility]. )                Permission sought to share information with:  Oceanographeracility Contact Representative Permission granted to share information::  Yes, Verbal Permission Granted  Name::      [facility]   Agency::     Relationship::     Contact Information:     Housing/Transportation Living arrangements for the past 2 months:  Skilled Nursing Facility Source of Information:  Facility Patient Interpreter Needed:  None Criminal Activity/Legal Involvement Pertinent to Current Situation/Hospitalization:  No - Comment as needed Significant Relationships:  Adult Children Lives with:  Facility Resident Do you feel safe going back to the place where you live?    Need for family participation in patient care:  Yes (Comment)  Care giving concerns:  Patient is a long [facility] followed [facility] [place].    Social Worker assessment / plan:  Visual merchandiserClinical Social Worker (CSW) reviewed chart and noted that patient is from [facility]. CSW contacted [facility] at [facility]. Per Sue LushAndrea patient is a long term care resident and is currently open to [facility]. Per Sue LushAndrea patient can return to [facility]. CSW attempted to meet with patient however she was asleep and per chart is not alert and oriented. CSW attempted to contact patient's daughter Nicholos JohnsKathleen however she did not answer and a voicemail was left. CSW will continue to follow and assist as needed.   Employment status:  Disabled (Comment on whether or not currently receiving Disability) Insurance information:  Other (Comment Required) ([facility] PT Recommendations:  Not assessed at this time Information / Referral to community  resources:  Skilled Nursing Facility  Patient/Family's Response to care:  CSW left patient's daughter a Engineer, technical salesvoicemail.   Patient/Family's Understanding of and Emotional Response to Diagnosis, Current Treatment, and Prognosis:  CSW left patient's daughter a voicemail.   Emotional Assessment Appearance:  Appears stated age Attitude/Demeanor/Rapport:  Unable to Assess Affect (typically observed):  Unable to Assess Orientation:  Oriented to Self, Fluctuating Orientation (Suspected and/or reported Sundowners) Alcohol / Substance use:  Not Applicable Psych involvement (Current and /or in the community):  No (Comment)  Discharge Needs  Concerns to be addressed:  Discharge Planning Concerns Readmission within the last 30 days:  No Current discharge risk:  Dependent with Mobility, Cognitively Impaired, Chronically ill, Terminally ill Barriers to Discharge:  Continued Medical Work up   [employer], Darleen CrockerBailey M, LCSW 03/08/2017, 3:23 PM

## 2017-03-08 NOTE — Progress Notes (Addendum)
Pharmacy Antibiotic Note  Diana Mccoy is a 71 y.o. female admitted on 03/06/2017 with bacteremia.  Pharmacy has been consulted for vancomycin dosing.  Patient was started on vancomycin on admission but antibiotics were stopped yesterday morning. Vancomycin being resumed based on 2/4 bottles positive for staph species, mec A detected in blood cultures.   Plan: Since it has been >30 hours since patient's last vancomycin dose, will restart with vancomycin 1000 mg dose now followed by vancomycin 1000 mg IV q18h (6 hour stacked).  Goal VT 15-20 mcg/mL  Kinetics:  Ke: 0.051 Half-life: 13 hrs Vd = 45 L  Height: 5\' 5"  (165.1 cm) Weight: 144 lb (65.3 kg) IBW/kg (Calculated) : 57  Temp (24hrs), Avg:98.3 F (36.8 C), Min:97.7 F (36.5 C), Max:98.6 F (37 C)   Recent Labs Lab 03/06/17 2235 03/07/17 0140 03/07/17 1121 03/08/17 0427  WBC 24.8*  --   --  15.2*  CREATININE 0.74  --  0.56 0.48  LATICACIDVEN 2.6* 2.0*  --   --     Estimated Creatinine Clearance: 56.4 mL/min (by C-G formula based on SCr of 0.48 mg/dL).    Allergies  Allergen Reactions  . Ciprofloxacin Nausea And Vomiting  . Penicillins     rash, hives  . Sodium Thiosulfate   . Sulfa Antibiotics     Antimicrobials this admission: vancomycin 9/5 >> 9/6 then restarted on 9/7 cefepime 9/5 >> 9/8  Dose adjustments this admission:  Microbiology results: 9/5 BCx: 2/4 staph species, mecA detected   Thank you for allowing pharmacy to be a part of this patient's care.  Diana Mccoy, PharmD 03/08/2017 2:50 PM

## 2017-03-09 DIAGNOSIS — R0603 Acute respiratory distress: Secondary | ICD-10-CM

## 2017-03-09 LAB — URINE CULTURE

## 2017-03-09 LAB — BASIC METABOLIC PANEL
ANION GAP: 5 (ref 5–15)
BUN: 12 mg/dL (ref 6–20)
CHLORIDE: 111 mmol/L (ref 101–111)
CO2: 27 mmol/L (ref 22–32)
Calcium: 8.2 mg/dL — ABNORMAL LOW (ref 8.9–10.3)
Creatinine, Ser: 0.57 mg/dL (ref 0.44–1.00)
GFR calc non Af Amer: >60 mL/min (ref >60)
Glucose, Bld: 130 mg/dL — ABNORMAL HIGH (ref 65–99)
Potassium: 3.9 mmol/L (ref 3.5–5.1)
Sodium: 143 mmol/L (ref 135–145)

## 2017-03-09 LAB — URINALYSIS, COMPLETE (UACMP) WITH MICROSCOPIC
BILIRUBIN URINE: NEGATIVE
Glucose, UA: NEGATIVE mg/dL
Hgb urine dipstick: NEGATIVE
Ketones, ur: NEGATIVE mg/dL
Nitrite: NEGATIVE
PROTEIN: NEGATIVE mg/dL
SPECIFIC GRAVITY, URINE: 1.031 — AB (ref 1.005–1.030)
pH: 5 (ref 5.0–8.0)

## 2017-03-09 LAB — CULTURE, BLOOD (ROUTINE X 2)

## 2017-03-09 LAB — PROCALCITONIN

## 2017-03-09 LAB — CBC
HCT: 36.2 % (ref 35.0–47.0)
Hemoglobin: 12.2 g/dL (ref 12.0–16.0)
MCH: 34.7 pg — ABNORMAL HIGH (ref 26.0–34.0)
MCHC: 33.7 g/dL (ref 32.0–36.0)
MCV: 103.1 fL — AB (ref 80.0–100.0)
Platelets: 157 10*3/uL (ref 150–440)
RBC: 3.51 MIL/uL — AB (ref 3.80–5.20)
RDW: 14.1 % (ref 11.5–14.5)
WBC: 13.2 10*3/uL — ABNORMAL HIGH (ref 3.6–11.0)

## 2017-03-09 LAB — PHOSPHORUS: PHOSPHORUS: 2.8 mg/dL (ref 2.5–4.6)

## 2017-03-09 LAB — MAGNESIUM: MAGNESIUM: 2 mg/dL (ref 1.7–2.4)

## 2017-03-09 NOTE — Consult Note (Signed)
Reason for Consult:Seizure Referring Physician: Imogene Burnhen  CC: Respiratory failure  HPI: Diana Mccoy is an 71 y.o. female who is unable to provide any history.  No family available a this time therefore all history obtained from the chart.  Patient with a history of stroke in [place] of this year (nonverbeal at baseline) and appears to have had a seizure in the past.  Presented in respiratory distress.  Was noted to have multiple metabolic abnormalities as well including elevated wbc count, lactic acidosis, hypokalemia and hypernatremia.  Was febrile and hypotensive as well and felt to be septic.  Patient was started on broad spectrum antibiotics.   Patient has improved.    Past Medical History:  Diagnosis Date  . Acid reflux 12/28/2014  . 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 Anxiety 12/28/2014  . BP (high blood pressure) 12/28/2014  . CAFL (chronic airflow limitation) (HCC) 12/28/2014  . COPD (chronic obstructive pulmonary disease) (HCC) 01/19/2016  . Depression 03/28/2015  . 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 Disorder of kidney 12/28/2014  . Gait instability 04/18/2015  . Osteoarthritis 03/28/2015  . 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 Schizophrenia (HCC) 03/28/2015  . Scoliosis 12/28/2014  . Spinal stenosis 12/28/2014    Past Surgical History:  Procedure Laterality Date  . HIP FRACTURE SURGERY Left     Family History  Problem Relation Age of Onset  . Gout Father     Social History:  reports that she has been smoking Cigarettes.  She has a 30.00 pack-year smoking history. She has never used smokeless tobacco. She reports that she does not drink alcohol or use drugs.  Allergies  Allergen Reactions  . Ciprofloxacin Nausea And Vomiting  . Penicillins     rash, hives  . Sodium Thiosulfate   . Sulfa Antibiotics     Medications:  I have reviewed the patient's current medications. Prior to Admission:  Prescriptions Prior to Admission  Medication Sig Dispense Refill  Last Dose  . acetaminophen (TYLENOL) 325 MG tablet Take 650 mg by mouth.   PRN at PRN  . bisacodyl (DULCOLAX) 10 MG suppository Place 10 mg rectally daily as needed for moderate constipation.   PRN at PRN  . fentaNYL (DURAGESIC - DOSED MCG/HR) 12 MCG/HR Place 12.5 mcg onto the skin every 3 (three) days.   03/04/2017 at 0800  . hyoscyamine (LEVSIN SL) 0.125 MG SL tablet Place 0.125-0.25 mg under the tongue every 4 (four) hours as needed.   PRN at PRN  . ipratropium-albuterol (DUONEB) 0.5-2.5 (3) MG/3ML SOLN Take 3 mLs by nebulization every 4 (four) hours as needed.   PRN at PRN  . Lidocaine (ASPERCREME LIDOCAINE) 4 % PTCH Apply 1 Package topically at bedtime. Apply to lower back. (12 hours on 12 hours off)   03/06/2017 at 2000  . Lidocaine 4 % PTCH Place 1 patch onto the skin daily. Remove & Discard patch within 12 hours or as directed by MD   03/06/2017 at 0800  . LORazepam (ATIVAN) 0.5 MG tablet Take 0.5 mg by mouth daily.   PRN at PRN  . morphine 20 MG/5ML solution Take 5-10 mg by mouth every hour as needed for pain.   [facility]  . ondansetron (ZOFRAN-ODT) 8 MG disintegrating tablet Take 8 mg by mouth every 8 (eight) hours as needed for nausea or vomiting.   PRN at PRN  . sertraline (ZOLOFT) 50 MG tablet Take 50 mg by mouth daily.   03/06/2017 at 0800  Scheduled: . aspirin EC  81 mg Oral Daily  . docusate sodium  100 mg Oral BID  . enoxaparin (LOVENOX) injection  40 mg Subcutaneous Q24H  . fentaNYL  12.5 mcg Transdermal Q72H  . ipratropium-albuterol  3 mL Nebulization Q6H  . LORazepam  0.5 mg Oral Daily  . methylPREDNISolone (SOLU-MEDROL) injection  20 mg Intravenous Daily  . sertraline  50 mg Oral Daily    ROS: Patient unable to provide  Physical Examination: Blood pressure (!) 128/55, pulse 83, temperature 98.4 F (36.9 C), temperature source Axillary, resp. rate 18, height  (1.651 m), weight 70.3 kg (155 lb), SpO2 100 %.  HEENT-  Normocephalic, no lesions, without obvious  abnormality.  Normal external eye and conjunctiva.  Normal TM's bilaterally.  Normal auditory canals and external ears. Normal external nose, mucus membranes and septum.  Normal pharynx. Cardiovascular- S1, S2 normal, pulses palpable throughout   Lungs- chest clear, no wheezing, rales, normal symmetric air entry Abdomen- soft, non-tender; bowel sounds normal; no masses,  no organomegaly Extremities- LLE edema Lymph-no adenopathy palpable Musculoskeletal-no joint tenderness, deformity or swelling Skin-warm and dry, no hyperpigmentation, vitiligo, or suspicious lesions  Neurological Examination   Mental Status: Alert.  Only speech noted is ouch.  Does not follow commands.   Cranial Nerves: II: Discs flat bilaterally; Blinks to bilateral confrontation.  Pupils equal, round, reactive to light and accommodation III,IV, VI: ptosis not present, extra-ocular motions unable to be tested V,VII: left facial droop VIII: hearing unable to be tested IX,X: gag reflex unable to be tested XI: unable to be tested XII: unable to be tested Motor: Moves right weakly but spontaneously.  Does not move the left or maintain against gravity. Increased tone in the RUE  Sensory: Responds to noxious stimuli on the left but not on the right Deep Tendon Reflexes: 3+ in the upper extremities, 2+ at the knees and absent at the ankles.   Plantars: Right: downgoing   Left: mute Cerebellar: Unable to be tested Gait: not tested due to safety concerns   Laboratory Studies:   Basic Metabolic Panel:  Recent Labs Lab 03/06/17 2235 03/07/17 1121 03/08/17 0427 03/08/17 1906 03/09/17 0422  NA 158* 153* 150*  --  143  K 3.1* 3.7 3.1* 3.7 3.9  CL 118* 120* 116*  --  111  CO2 --  27  GLUCOSE 208* 181* 127*  --  130*  BUN 24* 19 17  --  12  CREATININE 0.74 0.56 0.48  --  0.57  CALCIUM 9.1 8.1* 8.4*  --  8.2*  MG  --  1.7 2.4  --  2.0  PHOS  --  2.1* 1.9*  --  2.8    Liver Function Tests:  Recent  Labs Lab 03/06/17 2235  AST 24  ALT 16  ALKPHOS 99  BILITOT 0.8  PROT 6.1*  ALBUMIN 3.1*   No results for input(s): LIPASE, AMYLASE in the last 168 hours. No results for input(s): AMMONIA in the last 168 hours.  CBC:  Recent Labs Lab 03/06/17 2235 03/08/17 0427 03/09/17 0422  WBC 24.8* 15.2* 13.2*  NEUTROABS 16.0*  --   --   HGB 13.8 12.3 12.2  HCT 43.2 37.4 36.2  MCV 102.5* 103.7* 103.1*  PLT 327 167 157    Cardiac Enzymes: No results for input(s): CKTOTAL, CKMB, CKMBINDEX, TROPONINI in the last 168 hours.  BNP: Invalid input(s): POCBNP  CBG:  Recent Labs Lab 03/07/17 0118  GLUCAP 163*  Microbiology: Results for orders placed or performed during the hospital encounter of 03/06/17  Blood Culture (routine x 2)     Status: None (Preliminary result)   Collection Time: 03/06/17 10:36 PM  Result Value Ref Range Status   Specimen Description BLOOD BLOOD LEFT HAND  Final   Special Requests   Final    BOTTLES DRAWN AEROBIC AND ANAEROBIC Blood Culture results may not be optimal due to an excessive volume of blood received in culture bottles   Culture  Setup Time   Final    GRAM POSITIVE COCCI IN BOTH AEROBIC AND ANAEROBIC BOTTLES CRITICAL RESULT CALLED TO, READ BACK BY AND VERIFIED WITH: MATT MCBANE @ 0434 ON 03/08/2017 BY CAF Performed at [facility], [address] [place], [place] [address]    Culture GRAM POSITIVE COCCI  Final   Report Status PENDING  Incomplete  Blood Culture (routine x 2)     Status: None (Preliminary result)   Collection Time: 03/06/17 10:36 PM  Result Value Ref Range Status   Specimen Description BLOOD BLOOD RIGHT HAND  Final   Special Requests   Final    BOTTLES DRAWN AEROBIC AND ANAEROBIC Blood Culture adequate volume   Culture NO GROWTH 3 DAYS  Final   Report Status PENDING  Incomplete  Blood Culture ID Panel (Reflexed)     Status: Abnormal   Collection Time: 03/06/17 10:36 PM  Result Value Ref Range Status    Enterococcus species NOT DETECTED NOT DETECTED Final   Listeria monocytogenes NOT DETECTED NOT DETECTED Final   Staphylococcus species DETECTED (A) NOT DETECTED Final    Comment: Methicillin (oxacillin) resistant coagulase negative staphylococcus. Possible blood culture contaminant (unless isolated from more than one blood culture draw or clinical case suggests pathogenicity). No antibiotic treatment is indicated for blood  culture contaminants. CRITICAL RESULT CALLED TO, READ BACK BY AND VERIFIED WITH: MATT MCBANE @ 0434 ON 03/08/2017 BY CAF    Staphylococcus aureus NOT DETECTED NOT DETECTED Final   Methicillin resistance DETECTED (A) NOT DETECTED Final    Comment: CRITICAL RESULT CALLED TO, READ BACK BY AND VERIFIED WITH: MATT MCBANE @ 0434 ON 03/08/2017 BY CAF    Streptococcus species NOT DETECTED NOT DETECTED Final   Streptococcus agalactiae NOT DETECTED NOT DETECTED Final   Streptococcus pneumoniae NOT DETECTED NOT DETECTED Final   Streptococcus pyogenes NOT DETECTED NOT DETECTED Final   Acinetobacter baumannii NOT DETECTED NOT DETECTED Final   Enterobacteriaceae species NOT DETECTED NOT DETECTED Final   Enterobacter cloacae complex NOT DETECTED NOT DETECTED Final   Escherichia coli NOT DETECTED NOT DETECTED Final   Klebsiella oxytoca NOT DETECTED NOT DETECTED Final   Klebsiella pneumoniae NOT DETECTED NOT DETECTED Final   Proteus species NOT DETECTED NOT DETECTED Final   Serratia marcescens NOT DETECTED NOT DETECTED Final   Haemophilus influenzae NOT DETECTED NOT DETECTED Final   Neisseria meningitidis NOT DETECTED NOT DETECTED Final   Pseudomonas aeruginosa NOT DETECTED NOT DETECTED Final   Candida albicans NOT DETECTED NOT DETECTED Final   Candida glabrata NOT DETECTED NOT DETECTED Final   Candida krusei NOT DETECTED NOT DETECTED Final   Candida parapsilosis NOT DETECTED NOT DETECTED Final   Candida tropicalis NOT DETECTED NOT DETECTED Final  MRSA PCR Screening     Status: None    Collection Time: 03/07/17  1:15 AM  Result Value Ref Range Status   MRSA by PCR NEGATIVE NEGATIVE Final    Comment:        The  GeneXpert MRSA Assay (FDA approved for NASAL specimens only), is one component of a comprehensive MRSA colonization surveillance program. It is not intended to diagnose MRSA infection nor to guide or monitor treatment for MRSA infections.   CULTURE, BLOOD (ROUTINE X 2) w Reflex to ID Panel     Status: None (Preliminary result)   Collection Time: 03/09/17  4:22 AM  Result Value Ref Range Status   Specimen Description BLOOD LEFT ANTECUBITAL  Final   Special Requests   Final    BOTTLES DRAWN AEROBIC AND ANAEROBIC Blood Culture adequate volume   Culture NO GROWTH <12 HOURS  Final   Report Status PENDING  Incomplete  CULTURE, BLOOD (ROUTINE X 2) w Reflex to ID Panel     Status: None (Preliminary result)   Collection Time: 03/09/17  4:31 AM  Result Value Ref Range Status   Specimen Description BLOOD RIGHT ANTECUBITAL  Final   Special Requests   Final    BOTTLES DRAWN AEROBIC AND ANAEROBIC Blood Culture adequate volume   Culture NO GROWTH <12 HOURS  Final   Report Status PENDING  Incomplete    Coagulation Studies: No results for input(s): LABPROT, INR in the last 72 hours.  Urinalysis:  Recent Labs Lab 03/08/17 0926  COLORURINE AMBER*  LABSPEC 1.031*  PHURINE 5.0  GLUCOSEU NEGATIVE  HGBUR NEGATIVE  BILIRUBINUR NEGATIVE  KETONESUR NEGATIVE  PROTEINUR NEGATIVE  NITRITE NEGATIVE  LEUKOCYTESUR SMALL*    Lipid Panel:  No results found for: CHOL, TRIG, HDL, CHOLHDL, VLDL, LDLCALC  HgbA1C:  Lab Results  Component Value Date   HGBA1C 5.7 (H) 03/06/2017    Urine Drug Screen:  No results found for: LABOPIA, COCAINSCRNUR, LABBENZ, AMPHETMU, THCU, LABBARB  Alcohol Level: No results for input(s): ETH in the last 168 hours.   Imaging: Dg Chest 1 View  Result Date: 03/08/2017 CLINICAL DATA:  Hypoxia.  COPD.  Smoker. EXAM: CHEST 1 VIEW  COMPARISON:  03/06/2017 FINDINGS: The heart size and mediastinal contours are within normal limits. Aortic atherosclerosis. Both lungs are clear. The visualized skeletal structures are unremarkable. IMPRESSION: No active disease. Electronically Signed   By: Myles Rosenthal M.D.   On: 03/08/2017 08:43     Assessment/Plan: 71 year old female presenting in respiratory distress.  Unclear from the notes that any of her presentation actually represented a seizure.  If present though may very have been provoked with the multiple metabolic abnormalities noted and fever.  Currently on broad spectrum antibiotics.  No indication for anticonvulsant therapy at this time.  Will re-evaluate if further seizure-like events noted.   No further neurologic intervention is recommended at this time.  If further questions arise, please call or page at that time.  Thank you for allowing neurology to participate in the care of this patient.   Thana Farr, MD Neurology [phone] 03/09/2017, 10:02 AM

## 2017-03-09 NOTE — Progress Notes (Signed)
Took off bipap and placed on room air for the day

## 2017-03-09 NOTE — Progress Notes (Signed)
Chart reviewed. Spoke with Nsg. Pt is tolerating current diet and meds. Will continue with Dysphagia 1 diet with honey thick liquids. St to f/u 2-3 days.

## 2017-03-09 NOTE — Progress Notes (Addendum)
[facility] -  at [facility]   PATIENT NAME: Diana Mccoy    MR#:  [id number]  DATE OF BIRTH:  Mar 06, 1946  SUBJECTIVE:  CHIEF COMPLAINT:   Chief Complaint  Patient presents with  . Respiratory Distress  recent stroke, nonverbal, sent with sepsis from NH, not able to give any hx or symptoms. Initially required Bipap , now only at night.  REVIEW OF SYSTEMS:  Pt is nonverbal.  ROS  DRUG ALLERGIES:   Allergies  Allergen Reactions  . Ciprofloxacin Nausea And Vomiting  . Penicillins     rash, hives  . Sodium Thiosulfate   . Sulfa Antibiotics     VITALS:  Blood pressure (!) 128/55, pulse 83, temperature 98.4 F (36.9 C), temperature source Axillary, resp. rate 18, height  (1.651 m), weight 155 lb (70.3 kg), SpO2 100 %.  PHYSICAL EXAMINATION:  GENERAL:  71 y.o.-year-old patient lying in the bed with no acute distress.  EYES: Pupils equal, round, reactive to light and accommodation. No scleral icterus. Extraocular muscles intact.  HEENT: Head atraumatic, normocephalic. Oropharynx and nasopharynx clear.  NECK:  Supple, no jugular venous distention. No thyroid enlargement, no tenderness.  LUNGS: Normal breath sounds bilaterally, some wheezing, some crepitation. No use of accessory muscles of respiration.  CARDIOVASCULAR: S1, S2 normal. No murmurs, rubs, or gallops.  ABDOMEN: Soft, nontender, nondistended. Bowel sounds present. No organomegaly or mass.  EXTREMITIES: No pedal edema, cyanosis, or clubbing.  NEUROLOGIC: nonverbal. Muscle strength 3/5 right side and 0/5 left side, follows commands. Gait not checked. Nonverbal,  PSYCHIATRIC: The patient is alert, but nonverbal. SKIN: No obvious rash, lesion, or ulcer.   Physical Exam LABORATORY PANEL:   CBC  Recent Labs Lab 03/09/17 0422  WBC 13.2*  HGB 12.2  HCT 36.2  PLT 157    ------------------------------------------------------------------------------------------------------------------  Chemistries   Recent Labs Lab 03/06/17 2235  03/09/17 0422  NA 158*  < > 143  K 3.1*  < > 3.9  CL 118*  < > 111  CO2 29  < > 27  GLUCOSE 208*  < > 130*  BUN 24*  < > 12  CREATININE 0.74  < > 0.57  CALCIUM 9.1  < > 8.2*  MG  --   < > 2.0  AST 24  --   --   ALT 16  --   --   ALKPHOS 99  --   --   BILITOT 0.8  --   --   < > = values in this interval not displayed. ------------------------------------------------------------------------------------------------------------------  Cardiac Enzymes No results for input(s): TROPONINI in the last 168 hours. ------------------------------------------------------------------------------------------------------------------  RADIOLOGY:  Dg Chest 1 View  Result Date: 03/08/2017 CLINICAL DATA:  Hypoxia.  COPD.  Smoker. EXAM: CHEST 1 VIEW COMPARISON:  03/06/2017 FINDINGS: The heart size and mediastinal contours are within normal limits. Aortic atherosclerosis. Both lungs are clear. The visualized skeletal structures are unremarkable. IMPRESSION: No active disease. Electronically Signed   By: Myles Rosenthal M.D.   On: 03/08/2017 08:43    ASSESSMENT AND PLAN:   Active Problems:   Septic shock (HCC)   Pressure injury of skin  This is a 71 year old female admitted for septic shock.  1. Septic shock:  Improving with antibiotics. Follow blood cultures for growth and sensitivities.   As procalcitonin is low, Intensivist suggested to stop Abx.   Bl cx have staph species- started on vanc again. But it is Methicillin (oxacillin) resistant coagulase negative staphylococcus. Possible blood culture contaminant. Discontinue  vancomycin. UA is unremarkable. Leukocytosis is improving.   2. Respiratory failure with hypoxemia: COPD exacerbation Off BiPAP and oxygen.  3. COPD exacerbation: The patient was treated with Solu-Medrol  (discontinued). Continue duonebs. Improved.  4. DVT prophylaxis: Lovenox 5. GI prophylaxis: None  6. Recent stroke in [place] 2018- dysphagia.   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    Swallow evaluation done here, suggested dysphagia and pure diet.  Suspected seizure. No indication for anticonvulsant therapy at this time. No further neurologic intervention is recommended at this time per Dr. Thad Rangereynolds.  I called the patient's daughter Melina Modena(POA), [place], nobody answered the phone [phone]). All the records are reviewed and case discussed with Care Management/Social Workerr. Management plans discussed with one of her daughter and they are in agreement.  CODE STATUS: DNR  TOTAL TIME TAKING CARE OF THIS PATIENT: 35 minutes.   POSSIBLE D/C IN 1-2 DAYS, DEPENDING ON CLINICAL CONDITION.   Shaune Pollackhen, Mahamed Zalewski M.D on 03/09/2017   Between 7am to 6pm - Pager - [phone]  After 6pm go to [email or web address] - password [email or web address]  [facility] [place] [facility]  Office  [phone]  CC: Primary care physician; Karie SchwalbeLetvak, Richard I, MD  Note: This dictation was prepared with Dragon dictation along with smaller phrase technology. Any transcriptional errors that result from this process are unintentional.

## 2017-03-09 NOTE — Progress Notes (Signed)
MEDICATION RELATED CONSULT NOTE - FOLLOW UP   Pharmacy Consult for Electrolyte Monitoring Indication: hypokalemia, hypomagnesemia, hypophosphatemia  Allergies  Allergen Reactions  . Ciprofloxacin Nausea And Vomiting  . Penicillins     rash, hives  . Sodium Thiosulfate   . Sulfa Antibiotics     Patient Measurements: Height: 5\' 5"  (165.1 cm) Weight: 155 lb (70.3 kg) IBW/kg (Calculated) : 57  Labs:  Recent Labs  03/06/17 2235 03/07/17 1121 03/08/17 0427 03/09/17 0422  WBC 24.8*  --  15.2* 13.2*  HGB 13.8  --  12.3 12.2  HCT 43.2  --  37.4 36.2  PLT 327  --  167 157  CREATININE 0.74 0.56 0.48 0.57  MG  --  1.7 2.4 2.0  PHOS  --  2.1* 1.9* 2.8  ALBUMIN 3.1*  --   --   --   PROT 6.1*  --   --   --   AST 24  --   --   --   ALT 16  --   --   --   ALKPHOS 99  --   --   --   BILITOT 0.8  --   --   --    Assessment: Electrolytes WNL  Plan: No further supplementation warranted @ this time.   Recheck electrolytes with AM labs tomorrow.   Stormy CardKatsoudas,Makayle Krahn K, [facility]RPH Clinical Pharmacist 03/09/2017,11:21 AM

## 2017-03-10 LAB — BASIC METABOLIC PANEL
Anion gap: 5 (ref 5–15)
BUN: 8 mg/dL (ref 6–20)
CHLORIDE: 110 mmol/L (ref 101–111)
CO2: 25 mmol/L (ref 22–32)
Calcium: 8.5 mg/dL — ABNORMAL LOW (ref 8.9–10.3)
Creatinine, Ser: 0.38 mg/dL — ABNORMAL LOW (ref 0.44–1.00)
Glucose, Bld: 92 mg/dL (ref 65–99)
POTASSIUM: 3.8 mmol/L (ref 3.5–5.1)
SODIUM: 140 mmol/L (ref 135–145)

## 2017-03-10 LAB — PHOSPHORUS: PHOSPHORUS: 2.7 mg/dL (ref 2.5–4.6)

## 2017-03-10 LAB — MAGNESIUM: MAGNESIUM: 1.8 mg/dL (ref 1.7–2.4)

## 2017-03-10 MED ORDER — FLUCONAZOLE 40 MG/ML PO SUSR
200.0000 mg | Freq: Once | ORAL | Status: AC
  Administered 2017-03-10: 200 mg via ORAL
  Filled 2017-03-10: qty 5

## 2017-03-10 NOTE — Progress Notes (Signed)
[facility] - [place] at [facility]   PATIENT NAME: Diana Mccoy    MR#:  [id number]  DATE OF BIRTH:  01/23/1946  SUBJECTIVE:  CHIEF COMPLAINT:   Chief Complaint  Patient presents with  . Respiratory Distress  recent stroke, nonverbal, sent with sepsis from NH, not able to give any hx or symptoms. Off BIPAP and O2 [place]. REVIEW OF SYSTEMS:  Pt is nonverbal.  ROS  DRUG ALLERGIES:   Allergies  Allergen Reactions  . Ciprofloxacin Nausea And Vomiting  . Penicillins     rash, hives  . Sodium Thiosulfate   . Sulfa Antibiotics     VITALS:  Blood pressure 119/63, pulse 87, temperature 97.6 F (36.4 C), temperature source Axillary, resp. rate 18, height 5\' 5"  (1.651 m), weight 156 lb 2.2 oz (70.8 kg), SpO2 97 %.  PHYSICAL EXAMINATION:  GENERAL:  71 y.o.-year-old patient lying in the bed with no acute distress.  EYES: Pupils equal, round, reactive to light and accommodation. No scleral icterus. Extraocular muscles intact.  HEENT: Head atraumatic, normocephalic. Oropharynx and nasopharynx clear.  NECK:  Supple, no jugular venous distention. No thyroid enlargement, no tenderness.  LUNGS: Normal breath sounds bilaterally, some wheezing, some crepitation. No use of accessory muscles of respiration.  CARDIOVASCULAR: S1, S2 normal. No murmurs, rubs, or gallops.  ABDOMEN: Soft, nontender, nondistended. Bowel sounds present. No organomegaly or mass.  EXTREMITIES: No pedal edema, cyanosis, or clubbing.  NEUROLOGIC: nonverbal. Muscle strength 3/5 right side and 0/5 left side, follows commands. Gait not checked. Nonverbal,  PSYCHIATRIC: The patient is alert, but nonverbal. SKIN: No obvious rash, lesion, or ulcer.   Physical Exam LABORATORY PANEL:   CBC  Recent Labs Lab 03/09/17 0422  WBC 13.2*  HGB 12.2  HCT 36.2  PLT 157   ------------------------------------------------------------------------------------------------------------------  Chemistries   Recent  Labs Lab 03/06/17 2235  03/10/17 0341  NA 158*  < > 140  K 3.1*  < > 3.8  CL 118*  < > 110  CO2 29  < > 25  GLUCOSE 208*  < > 92  BUN 24*  < > 8  CREATININE 0.74  < > 0.38*  CALCIUM 9.1  < > 8.5*  MG  --   < > 1.8  AST 24  --   --   ALT 16  --   --   ALKPHOS 99  --   --   BILITOT 0.8  --   --   < > = values in this interval not displayed. ------------------------------------------------------------------------------------------------------------------  Cardiac Enzymes No results for input(s): TROPONINI in the last 168 hours. ------------------------------------------------------------------------------------------------------------------  RADIOLOGY:  No results found.  ASSESSMENT AND PLAN:   Active Problems:   Septic shock (HCC)   Pressure injury of skin  This is a 71 year old female admitted for septic shock.  1. Septic shock:  Improved.   As procalcitonin is low, Intensivist suggested to stop Abx.   Bl cx have staph species- started on vanc again. But it is Methicillin (oxacillin) resistant coagulase negative staphylococcus. Possible blood culture contaminant. Discontinue vancomycin. UA is unremarkable. Leukocytosis is improving.   2. Respiratory failure with hypoxemia: COPD exacerbation Off BiPAP and oxygen.  3. COPD exacerbation: The patient was treated with Solu-Medrol (discontinued). Continue duonebs. Improved.  4. DVT prophylaxis: Lovenox 5. GI prophylaxis: None  6. Recent stroke in [place] 2018- dysphagia.   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    Swallow evaluation done here, suggested dysphagia and pure diet.  Suspected seizure. No indication for anticonvulsant therapy at this time. No further neurologic intervention is recommended at this time per Dr.  Thad Ranger.  I called the patient's daughter Melina Modena) again today, nobody answered the phone [phone]). All the records are reviewed and case discussed with Care Management/Social Workerr. Management plans discussed with her niece and they are in agreement.  CODE STATUS: DNR  TOTAL TIME TAKING CARE OF THIS PATIENT: 26 minutes.   POSSIBLE D/C IN 1 DAYS, DEPENDING ON CLINICAL CONDITION.   Shaune Pollack M.D on 03/10/2017   Between 7am to 6pm - Pager - [phone]  After 6pm go to [email or web address] - password [email or web address]  [facility] [place] [facility]  Office  [phone]  CC: Primary care physician; Karie Schwalbe, MD  Note: This dictation was prepared with Dragon dictation along with smaller phrase technology. Any transcriptional errors that result from this process are unintentional.

## 2017-03-10 NOTE — Progress Notes (Signed)
MEDICATION RELATED CONSULT NOTE - FOLLOW UP   Pharmacy Consult for Electrolyte Monitoring Indication: hypokalemia, hypomagnesemia, hypophosphatemia  Allergies  Allergen Reactions  . Ciprofloxacin Nausea And Vomiting  . Penicillins     rash, hives  . Sodium Thiosulfate   . Sulfa Antibiotics     Patient Measurements: Height: 5\' 5"  (165.1 cm) Weight: 156 lb 2.2 oz (70.8 kg) IBW/kg (Calculated) : 57  Labs:  Recent Labs  03/08/17 0427 03/09/17 0422 03/10/17 0341  WBC 15.2* 13.2*  --   HGB 12.3 12.2  --   HCT 37.4 36.2  --   PLT 167 157  --   CREATININE 0.48 0.57 0.38*  MG 2.4 2.0 1.8  PHOS 1.9* 2.8 2.7   Assessment: Electrolytes WNL  Plan: No further supplementation warranted @ this time.   Recheck electrolytes with AM labs tomorrow.   Stormy CardKatsoudas,Marithza Malachi K, [facility] PearlandRPH Clinical Pharmacist 03/10/2017,8:05 AM

## 2017-03-10 NOTE — Progress Notes (Signed)
Pt could not keep BIPAP mask on her face and signals to take it off. This Clinical research associatewriter and some nursing staff on the unit have been to pt's room multiple times to fix BIPAP mask. Respiratory therapist Nadine CountsBob notified and stated ok for pt to be on room air. Pt taken off from BIPAP, breathing and saturating well, 94% on room air.

## 2017-03-11 LAB — BASIC METABOLIC PANEL
Anion gap: 4 — ABNORMAL LOW (ref 5–15)
BUN: 8 mg/dL (ref 6–20)
CHLORIDE: 109 mmol/L (ref 101–111)
CO2: 30 mmol/L (ref 22–32)
CREATININE: 0.42 mg/dL — AB (ref 0.44–1.00)
Calcium: 8.4 mg/dL — ABNORMAL LOW (ref 8.9–10.3)
GFR calc Af Amer: >60 mL/min (ref >60)
GFR calc non Af Amer: >60 mL/min (ref >60)
GLUCOSE: 88 mg/dL (ref 65–99)
Potassium: 4.1 mmol/L (ref 3.5–5.1)
Sodium: 143 mmol/L (ref 135–145)

## 2017-03-11 LAB — CULTURE, BLOOD (ROUTINE X 2)
Culture: NO GROWTH
Special Requests: ADEQUATE

## 2017-03-11 LAB — MAGNESIUM: Magnesium: 1.9 mg/dL (ref 1.7–2.4)

## 2017-03-11 MED ORDER — ASPIRIN 81 MG PO TBEC: 81.0000 mg | DELAYED_RELEASE_TABLET | Freq: Every day | ORAL | Status: AC

## 2017-03-11 NOTE — Plan of Care (Signed)
Problem: SLP Dysphagia Goals Goal: Misc Dysphagia Goal Pt will safely tolerate po diet of least restrictive consistency w/ no overt s/s of aspiration noted by Staff/pt/family x3 sessions.    

## 2017-03-11 NOTE — Discharge Instructions (Signed)
Hospice care in skilled nursing facility. Avoid being transferred to hospital if possible.

## 2017-03-11 NOTE — Progress Notes (Signed)
MEDICATION RELATED CONSULT NOTE - FOLLOW UP   Pharmacy Consult for Electrolyte Monitoring Indication: hypokalemia, hypomagnesemia, hypophosphatemia  Allergies  Allergen Reactions  . Ciprofloxacin Nausea And Vomiting  . Penicillins     rash, hives  . Sodium Thiosulfate   . Sulfa Antibiotics     Patient Measurements: Height: 5\' 5"  (165.1 cm) Weight: 156 lb 6.4 oz (70.9 kg) IBW/kg (Calculated) : 57  Labs:  Recent Labs  03/09/17 0422 03/10/17 0341 03/11/17 0353  WBC 13.2*  --   --   HGB 12.2  --   --   HCT 36.2  --   --   PLT 157  --   --   CREATININE 0.57 0.38* 0.42*  MG 2.0 1.8 1.9  PHOS 2.8 2.7  --    Assessment: Electrolytes WNL  Plan: No further supplementation warranted @ this time.   Recheck electrolytes with AM labs tomorrow.   9/10 0353 K 4.1, Mg 1.9, phos not assessed. No further supplement warranted at this time. Will recheck electrolytes with AM labs in 2 days.  Carola FrostNathan A Teneshia Hedeen, Pharm.D., BCPS Clinical Pharmacist 03/11/2017,7:14 AM

## 2017-03-11 NOTE — Progress Notes (Signed)
Palliative Medicine RN Note: Pt is active with [facility], who is aware of admission. Their RN Clydie BraunKaren reports that there are no unmet palliative needs, and she will call us if that changes. PMT will not see the pt at this time.  Margret ChanceMelanie G. Cameo [place], RN, BSN, [facility] 03/11/2017 9:48 AM Cell [phone] 8:00-4:00 Monday-Friday Office [phone]

## 2017-03-11 NOTE — Progress Notes (Signed)
Patient is medically stable for D/C back to [facility] and [facility]. Per Sue LushAndrea admissions coordinator at [facility] patient can return today to room 301-A. RN will call report at [phone] and arrange EMS for transport. Clinical Child psychotherapistocial Worker (CSW) sent D[facility] orders [facility]. Patient is aware of above. CSW contacted patient's daughter Nicholos JohnsKathleen and made her aware of above. Clydie BraunKaren [place] hosp[facility] liaison is aware of above. Please reconsult if future social work needs arise. CSW signing off.   [employer] Hughes IncorporatedBailey Lanie Schelling, LCSW [phone]

## 2017-03-11 NOTE — Progress Notes (Signed)
Visit made. Patient seen sitting up in bed, alert, niece at bedside. Patient able to take pills and is tolerating purreed diet and honey thick liquids, drank thickened Coke during visit. Plan is [facility]. Hospice team made aware. Thank you. Dayna BarkerKaren robertson RN, BSN, [facility], hospital Liaison [phone]

## 2017-03-11 NOTE — Discharge Planning (Addendum)
Patient IV removed.  Discharge papers printed and placed in packet for facility. Report call to [facility] and s/w Ozella Rocksheryl Wheeler, RN - patient returning to 301-[address] and Hospice will be following. RN assessment and VS revealed stability for discharge.  EMS contacted to transport and family aware. Awaiting EMS arrival.

## 2017-03-11 NOTE — Discharge Summary (Signed)
[facility] [place] [facility]   PATIENT NAME: Diana Mccoy    MR#:  [id number]  DATE OF BIRTH:  1946/04/09  DATE OF ADMISSION:  03/06/2017   ADMITTING PHYSICIAN: Arnaldo Natal, MD  DATE OF DISCHARGE: No discharge date for patient encounter.  PRIMARY CARE PHYSICIAN: Karie Schwalbe, MD   ADMISSION DIAGNOSIS:  Respiratory distress [R06.03] Elevated lactic acid level [R79.89] Sepsis, due to unspecified organism (HCC) [A41.9] Leukocytosis, unspecified type [D72.829] DISCHARGE DIAGNOSIS:  Active Problems:   Septic shock (HCC)   Pressure injury of skin  SECONDARY DIAGNOSIS:   Past Medical History:  Diagnosis Date  . Acid reflux 12/28/2014  . 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 Anxiety 12/28/2014  . BP (high blood pressure) 12/28/2014  . CAFL (chronic airflow limitation) (HCC) 12/28/2014  . COPD (chronic obstructive pulmonary disease) (HCC) 01/19/2016  . Depression 03/28/2015  . 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 Disorder of kidney 12/28/2014  . Gait instability 04/18/2015  . Osteoarthritis 03/28/2015  . 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 Schizophrenia (HCC) 03/28/2015  . Scoliosis 12/28/2014  . Spinal stenosis 12/28/2014   HOSPITAL COURSE:   This is a 71 year old female admitted for septic shock.  1. Septic shock:  Improved.   As procalcitonin is low, Intensivist suggested to stop Abx.   Bl cx have staph species- started on vanc again. But it is Methicillin (oxacillin) resistant coagulase negative staphylococcus. Possible blood culture contaminant. Discontinue vancomycin. UA is unremarkable. Leukocytosis is improving.   2. Respiratory failure with hypoxemia: COPD exacerbation Off BiPAP and oxygen.  3. COPD exacerbation: The patient was treated with Solu-Medrol (discontinued). Continue duonebs. Improved.  4. DVT prophylaxis: Lovenox 5. GI prophylaxis: None  6. Recent stroke in [place] 2018- dysphagia.   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    Swallow evaluation done here, suggested dysphagia and pure diet.  Suspected seizure. No indication for anticonvulsant therapy at this time. No further neurologic intervention is recommended at this time per Dr. Thad Ranger.  DISCHARGE CONDITIONS:  Stable, discharge to long term facility with hospice care. CONSULTS OBTAINED:  Treatment Team:  Mick Sell, MD Thana Farr, MD DRUG ALLERGIES:   Allergies  Allergen Reactions  . Ciprofloxacin Nausea And Vomiting  . Penicillins     rash, hives  . Sodium Thiosulfate   . Sulfa Antibiotics    DISCHARGE MEDICATIONS:   Allergies as of 03/11/2017      Reactions   Ciprofloxacin Nausea And Vomiting   Penicillins    rash, hives   Sodium Thiosulfate    Sulfa Antibiotics       Medication List    TAKE these medications   acetaminophen 325 MG tablet Commonly known as:  TYLENOL Take 650 mg by mouth.   aspirin 81 MG EC tablet Take 1 tablet (81 mg total) by mouth daily.   bisacodyl 10 MG suppository Commonly known as:  DULCOLAX Place 10 mg rectally daily as needed for moderate constipation.   fentaNYL 12 MCG/HR Commonly known as:  DURAGESIC - dosed mcg/hr Place 12.5 mcg onto the skin every 3 (three) days.   hyoscyamine 0.125 MG SL tablet Commonly known as:  LEVSIN SL Place 0.125-0.25 mg under the tongue every 4 (four) hours as  needed.   ipratropium-albuterol 0.5-2.5 (3) MG/3ML Soln Commonly known as:  DUONEB Take 3 mLs by nebulization every 4 (four) hours as needed.   Lidocaine 4 % Ptch Place 1 patch onto the skin daily. Remove & Discard patch within 12 hours or as directed by MD   ASPERCREME LIDOCAINE 4 % Ptch Generic drug:  Lidocaine Apply 1 Package topically at bedtime. Apply to lower  back. (12 hours on 12 hours off)   LORazepam 0.5 MG tablet Commonly known as:  ATIVAN Take 0.5 mg by mouth daily.   morphine 20 MG/5ML solution Take 5-10 mg by mouth every hour as needed for pain.   ondansetron 8 MG disintegrating tablet Commonly known as:  ZOFRAN-ODT Take 8 mg by mouth every 8 (eight) hours as needed for nausea or vomiting.   sertraline 50 MG tablet Commonly known as:  ZOLOFT Take 50 mg by mouth daily.            Discharge Care Instructions        Start     Ordered   03/12/17 0000  aspirin EC 81 MG EC tablet  Daily     03/11/17 1003   03/11/17 0000  Increase activity slowly     03/11/17 0808   03/11/17 0000  Diet - low sodium heart healthy     03/11/17 8657       DISCHARGE INSTRUCTIONS:  See AVS.  If you experience worsening of your admission symptoms, develop shortness of breath, life threatening emergency, suicidal or homicidal thoughts you must seek medical attention immediately by calling 911 or calling your MD immediately  if symptoms less severe.  You Must read complete instructions/literature along with all the possible adverse reactions/side effects for all the Medicines you take and that have been prescribed to you. Take any new Medicines after you have completely understood and accpet all the possible adverse reactions/side effects.   Please note  You were cared for by a hospitalist during your hospital stay. If you have any questions about your discharge medications or the care you received while you were in the hospital after you are discharged, you can call the unit and asked to speak with the hospitalist on call if the hospitalist that took care of you is not available. Once you are discharged, your primary care physician will handle any further medical issues. Please note that NO REFILLS for any discharge medications will be authorized once you are discharged, as it is imperative that you return to your primary care physician (or  establish a relationship with a primary care physician if you do not have one) for your aftercare needs so that they can reassess your need for medications and monitor your lab values.    On the day of Discharge:  VITAL SIGNS:  Blood pressure (!) 135/55, pulse 94, temperature 98.3 F (36.8 C), temperature source Oral, resp. rate 20, height  (1.651 m), weight 156 lb 6.4 oz (70.9 kg), SpO2 91 %. PHYSICAL EXAMINATION:  GENERAL:  71 y.o.-year-old patient lying in the bed with no acute distress.  EYES: No scleral icterus. Extraocular muscles intact.  HEENT: Head atraumatic, normocephalic.  NECK:  Supple, no jugular venous distention. No thyroid enlargement, no tenderness.  LUNGS: Normal breath sounds bilaterally, no wheezing, rales,rhonchi or crepitation. No use of accessory muscles of respiration.  CARDIOVASCULAR: S1, S2 normal. No murmurs, rubs, or gallops.  ABDOMEN: Soft, non-tender, non-distended. Bowel sounds present. No organomegaly or mass.  EXTREMITIES: No pedal edema, cyanosis, or  clubbing.  NEUROLOGIC: nonverbal, unable to exam. PSYCHIATRIC: The patient is nonverbal. SKIN: No obvious rash, lesion, or ulcer.  DATA REVIEW:   CBC  Recent Labs Lab 03/09/17 0422  WBC 13.2*  HGB 12.2  HCT 36.2  PLT 157    Chemistries   Recent Labs Lab 03/06/17 2235  03/11/17 0353  NA 158*  < > 143  K 3.1*  < > 4.1  CL 118*  < > 109  CO2 29  < > 30  GLUCOSE 208*  < > 88  BUN 24*  < > 8  CREATININE 0.74  < > 0.42*  CALCIUM 9.1  < > 8.4*  MG  --   < > 1.9  AST 24  --   --   ALT 16  --   --   ALKPHOS 99  --   --   BILITOT 0.8  --   --   < > = values in this interval not displayed.   Microbiology Results  Results for orders placed or performed during the hospital encounter of 03/06/17  Blood Culture (routine x 2)     Status: Abnormal   Collection Time: 03/06/17 10:36 PM  Result Value Ref Range Status   Specimen Description BLOOD BLOOD LEFT HAND  Final   Special Requests    Final    BOTTLES DRAWN AEROBIC AND ANAEROBIC Blood Culture results may not be optimal due to an excessive volume of blood received in culture bottles   Culture  Setup Time   Final    GRAM POSITIVE COCCI IN BOTH AEROBIC AND ANAEROBIC BOTTLES CRITICAL RESULT CALLED TO, READ BACK BY AND VERIFIED WITH: MATT MCBANE @ 0434 ON 03/08/2017 BY CAF    Culture (A)  Final    STAPHYLOCOCCUS SPECIES (COAGULASE NEGATIVE) THE SIGNIFICANCE OF ISOLATING THIS ORGANISM FROM A SINGLE SET OF BLOOD CULTURES WHEN MULTIPLE SETS ARE DRAWN IS UNCERTAIN. PLEASE NOTIFY THE MICROBIOLOGY DEPARTMENT WITHIN ONE WEEK IF SPECIATION AND SENSITIVITIES ARE REQUIRED. Performed at [facility], [address], [place], [place] [address]    Report Status 03/09/2017 FINAL  Final  Blood Culture (routine x 2)     Status: None   Collection Time: 03/06/17 10:36 PM  Result Value Ref Range Status   Specimen Description BLOOD BLOOD RIGHT HAND  Final   Special Requests   Final    BOTTLES DRAWN AEROBIC AND ANAEROBIC Blood Culture adequate volume   Culture NO GROWTH 5 DAYS  Final   Report Status 03/11/2017 FINAL  Final  Blood Culture ID Panel (Reflexed)     Status: Abnormal   Collection Time: 03/06/17 10:36 PM  Result Value Ref Range Status   Enterococcus species NOT DETECTED NOT DETECTED Final   Listeria monocytogenes NOT DETECTED NOT DETECTED Final   Staphylococcus species DETECTED (A) NOT DETECTED Final    Comment: Methicillin (oxacillin) resistant coagulase negative staphylococcus. Possible blood culture contaminant (unless isolated from more than one blood culture draw or clinical case suggests pathogenicity). No antibiotic treatment is indicated for blood  culture contaminants. CRITICAL RESULT CALLED TO, READ BACK BY AND VERIFIED WITH: MATT MCBANE @ 0434 ON 03/08/2017 BY CAF    Staphylococcus aureus NOT DETECTED NOT DETECTED Final   Methicillin resistance DETECTED (A) NOT DETECTED Final    Comment: CRITICAL RESULT CALLED TO,  READ BACK BY AND VERIFIED WITH: MATT MCBANE @ 0434 ON 03/08/2017 BY CAF    Streptococcus species NOT DETECTED NOT DETECTED Final   Streptococcus agalactiae NOT DETECTED  NOT DETECTED Final   Streptococcus pneumoniae NOT DETECTED NOT DETECTED Final   Streptococcus pyogenes NOT DETECTED NOT DETECTED Final   Acinetobacter baumannii NOT DETECTED NOT DETECTED Final   Enterobacteriaceae species NOT DETECTED NOT DETECTED Final   Enterobacter cloacae complex NOT DETECTED NOT DETECTED Final   Escherichia coli NOT DETECTED NOT DETECTED Final   Klebsiella oxytoca NOT DETECTED NOT DETECTED Final   Klebsiella pneumoniae NOT DETECTED NOT DETECTED Final   Proteus species NOT DETECTED NOT DETECTED Final   Serratia marcescens NOT DETECTED NOT DETECTED Final   Haemophilus influenzae NOT DETECTED NOT DETECTED Final   Neisseria meningitidis NOT DETECTED NOT DETECTED Final   Pseudomonas aeruginosa NOT DETECTED NOT DETECTED Final   Candida albicans NOT DETECTED NOT DETECTED Final   Candida glabrata NOT DETECTED NOT DETECTED Final   Candida krusei NOT DETECTED NOT DETECTED Final   Candida parapsilosis NOT DETECTED NOT DETECTED Final   Candida tropicalis NOT DETECTED NOT DETECTED Final  MRSA PCR Screening     Status: None   Collection Time: 03/07/17  1:15 AM  Result Value Ref Range Status   MRSA by PCR NEGATIVE NEGATIVE Final    Comment:        The GeneXpert MRSA Assay (FDA approved for NASAL specimens only), is one component of a comprehensive MRSA colonization surveillance program. It is not intended to diagnose MRSA infection nor to guide or monitor treatment for MRSA infections.   Urine Culture     Status: Abnormal   Collection Time: 03/08/17  9:26 AM  Result Value Ref Range Status   Specimen Description URINE, RANDOM  Final   Special Requests NONE  Final   Culture 30,000 COLONIES/mL YEAST (A)  Final   Report Status 03/09/2017 FINAL  Final  CULTURE, BLOOD (ROUTINE X 2) w Reflex to ID Panel      Status: None (Preliminary result)   Collection Time: 03/09/17  4:22 AM  Result Value Ref Range Status   Specimen Description BLOOD LEFT ANTECUBITAL  Final   Special Requests   Final    BOTTLES DRAWN AEROBIC AND ANAEROBIC Blood Culture adequate volume   Culture NO GROWTH 2 DAYS  Final   Report Status PENDING  Incomplete  CULTURE, BLOOD (ROUTINE X 2) w Reflex to ID Panel     Status: None (Preliminary result)   Collection Time: 03/09/17  4:31 AM  Result Value Ref Range Status   Specimen Description BLOOD RIGHT ANTECUBITAL  Final   Special Requests   Final    BOTTLES DRAWN AEROBIC AND ANAEROBIC Blood Culture adequate volume   Culture NO GROWTH 2 DAYS  Final   Report Status PENDING  Incomplete    RADIOLOGY:  No results found.   Management plans discussed with the patient, family and they are in agreement.  CODE STATUS: DNR   TOTAL TIME TAKING CARE OF THIS PATIENT: 32 minutes.    Shaune Pollackhen, Reha Martinovich M.D on 03/11/2017 at 10:00 AM  Between 7am to 6pm - Pager - [phone]  After 6pm go to [email or web address] - Social research officer, governmentpassword EPAS [facility]  [facility] [place] [facility]  Office  [phone]  CC: Primary care physician; Karie SchwalbeLetvak, Richard I, MD   Note: This dictation was prepared with Dragon dictation along with smaller phrase technology. Any transcriptional errors that result from this process are unintentional.

## 2017-03-12 DIAGNOSIS — G40909 Epilepsy, unspecified, not intractable, without status epilepticus: Secondary | ICD-10-CM | POA: Diagnosis not present

## 2017-03-12 DIAGNOSIS — J439 Emphysema, unspecified: Secondary | ICD-10-CM | POA: Diagnosis not present

## 2017-03-12 DIAGNOSIS — F200 Paranoid schizophrenia: Secondary | ICD-10-CM | POA: Diagnosis not present

## 2017-03-12 DIAGNOSIS — F39 Unspecified mood [affective] disorder: Secondary | ICD-10-CM | POA: Diagnosis not present

## 2017-03-12 DIAGNOSIS — G8194 Hemiplegia, unspecified affecting left nondominant side: Secondary | ICD-10-CM | POA: Diagnosis not present

## 2017-03-14 LAB — CULTURE, BLOOD (ROUTINE X 2)
CULTURE: NO GROWTH
CULTURE: NO GROWTH
SPECIAL REQUESTS: ADEQUATE
SPECIAL REQUESTS: ADEQUATE

## 2017-03-24 IMAGING — RF DG SWALLOWING FUNCTION
2 series · 6 of 6 positions shown · non-contrast
Comparison: None.

CLINICAL DATA: Dysphagia

EXAM:
MODIFIED BARIUM SWALLOW
TECHNIQUE: Different consistencies of barium were administered orally to the
patient by the Speech Pathologist. Imaging of the pharynx was
performed in the lateral projection.
FLUOROSCOPY TIME:  Fluoroscopy Time:  2 minutes
Radiation Exposure Index (if provided by the fluoroscopic device):
547.97 micro Gy m2
Number of Acquired Spot Images: 1

[Series 1: cp_standard · 0.34mm/px · 4 of 86 frames shown (1 of 2)]
[frame 13/86]
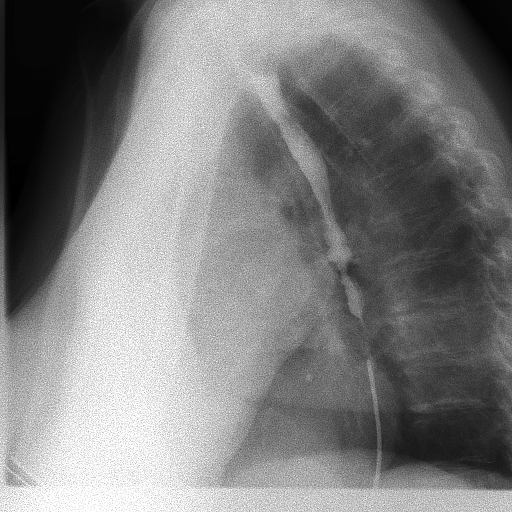
[frame 44/86]
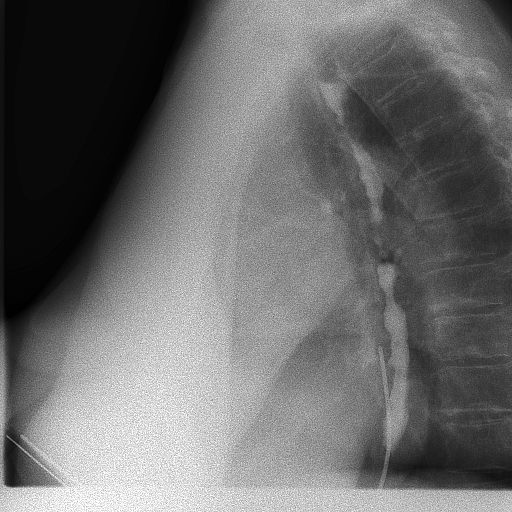
[frame 45/86]
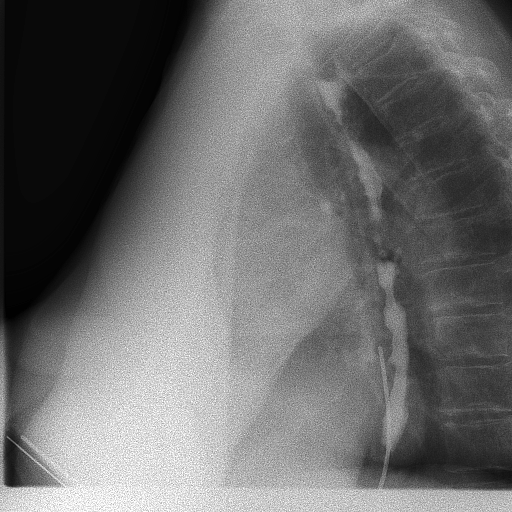
[frame 74/86]
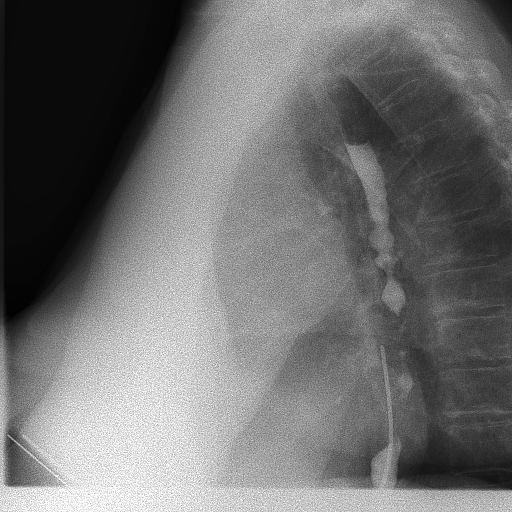

[Series 2: cp_standard · 0.17mm/px · 2 of 2 slices shown (2 of 2)]
[im 1/2]
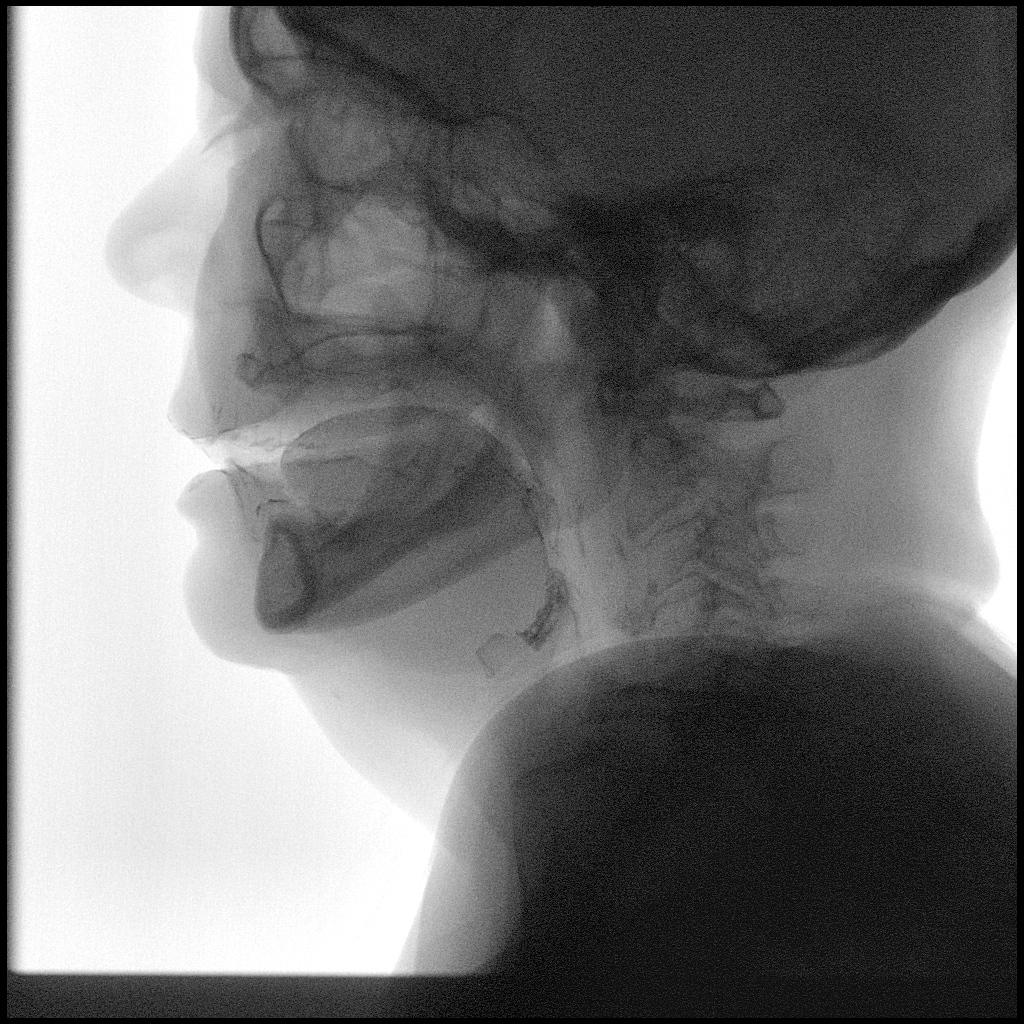
[im 2/2]
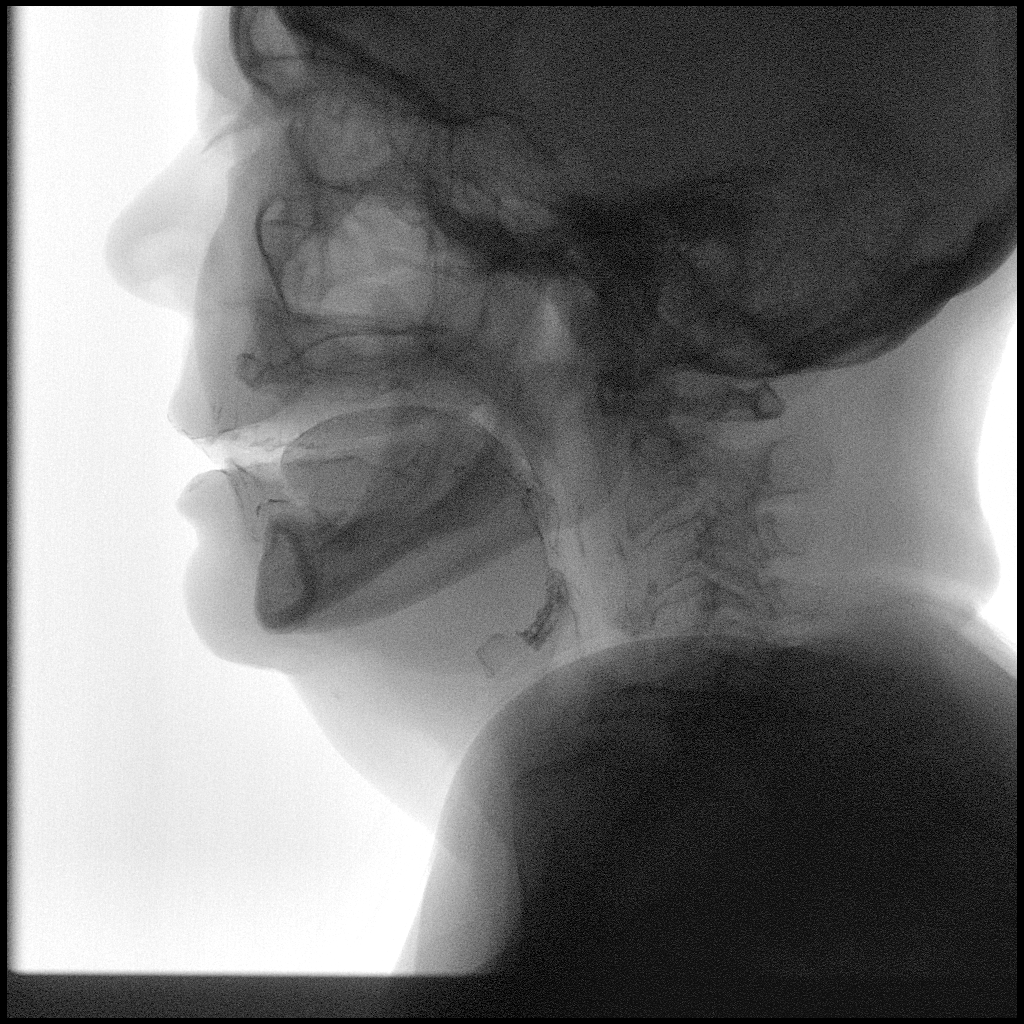

[6 of 6 positions shown; findings below may reference images not displayed]

FINDINGS: Due to technical error, the images were not saved.

Thin liquid- within normal limits

Ghimire?Yiu Lun within normal limits

Ghimire?Kiara with cracker- within normal limits

Survey views of the thoracic esophagus reveal moderate changes of
presbyesophagus with prominent tertiary contractions. No fixed
strictures are observed.
IMPRESSION: Normal appearance of the swallowing maneuver with thin liquids,
puree, and puree with cracker.

Changes of presbyesophagus with prominent tertiary contractions
observed in the thoracic esophagus.

Please refer to the Speech Pathologists report for complete details
and recommendations.

## 2017-04-10 DIAGNOSIS — R1310 Dysphagia, unspecified: Secondary | ICD-10-CM | POA: Diagnosis not present

## 2017-04-10 DIAGNOSIS — F209 Schizophrenia, unspecified: Secondary | ICD-10-CM | POA: Diagnosis not present

## 2017-04-10 DIAGNOSIS — I69354 Hemiplegia and hemiparesis following cerebral infarction affecting left non-dominant side: Secondary | ICD-10-CM | POA: Diagnosis not present

## 2017-04-10 DIAGNOSIS — I69391 Dysphagia following cerebral infarction: Secondary | ICD-10-CM | POA: Diagnosis not present

## 2017-04-26 DIAGNOSIS — J449 Chronic obstructive pulmonary disease, unspecified: Secondary | ICD-10-CM | POA: Diagnosis not present

## 2017-04-26 DIAGNOSIS — F39 Unspecified mood [affective] disorder: Secondary | ICD-10-CM | POA: Diagnosis not present

## 2017-04-26 DIAGNOSIS — I69398 Other sequelae of cerebral infarction: Secondary | ICD-10-CM | POA: Diagnosis not present

## 2017-04-26 DIAGNOSIS — G40911 Epilepsy, unspecified, intractable, with status epilepticus: Secondary | ICD-10-CM | POA: Diagnosis not present

## 2017-04-26 DIAGNOSIS — F200 Paranoid schizophrenia: Secondary | ICD-10-CM | POA: Diagnosis not present

## 2017-05-02 DIAGNOSIS — R1310 Dysphagia, unspecified: Secondary | ICD-10-CM | POA: Diagnosis not present

## 2017-05-02 DIAGNOSIS — G8929 Other chronic pain: Secondary | ICD-10-CM | POA: Diagnosis not present

## 2017-05-02 DIAGNOSIS — K219 Gastro-esophageal reflux disease without esophagitis: Secondary | ICD-10-CM | POA: Diagnosis not present

## 2017-05-02 DIAGNOSIS — F209 Schizophrenia, unspecified: Secondary | ICD-10-CM | POA: Diagnosis not present

## 2017-05-02 DIAGNOSIS — L89620 Pressure ulcer of left heel, unstageable: Secondary | ICD-10-CM | POA: Diagnosis not present

## 2017-05-02 DIAGNOSIS — J449 Chronic obstructive pulmonary disease, unspecified: Secondary | ICD-10-CM | POA: Diagnosis not present

## 2017-05-02 DIAGNOSIS — M810 Age-related osteoporosis without current pathological fracture: Secondary | ICD-10-CM | POA: Diagnosis not present

## 2017-05-02 DIAGNOSIS — F329 Major depressive disorder, single episode, unspecified: Secondary | ICD-10-CM | POA: Diagnosis not present

## 2017-05-02 DIAGNOSIS — Z9981 Dependence on supplemental oxygen: Secondary | ICD-10-CM | POA: Diagnosis not present

## 2017-05-02 DIAGNOSIS — Z431 Encounter for attention to gastrostomy: Secondary | ICD-10-CM | POA: Diagnosis not present

## 2017-05-02 DIAGNOSIS — I69354 Hemiplegia and hemiparesis following cerebral infarction affecting left non-dominant side: Secondary | ICD-10-CM | POA: Diagnosis not present

## 2017-05-02 DIAGNOSIS — Z7401 Bed confinement status: Secondary | ICD-10-CM | POA: Diagnosis not present

## 2017-05-02 DIAGNOSIS — I69320 Aphasia following cerebral infarction: Secondary | ICD-10-CM | POA: Diagnosis not present

## 2017-05-02 DIAGNOSIS — I69391 Dysphagia following cerebral infarction: Secondary | ICD-10-CM | POA: Diagnosis not present

## 2017-05-02 DIAGNOSIS — F419 Anxiety disorder, unspecified: Secondary | ICD-10-CM | POA: Diagnosis not present

## 2017-05-03 DIAGNOSIS — R1310 Dysphagia, unspecified: Secondary | ICD-10-CM | POA: Diagnosis not present

## 2017-05-03 DIAGNOSIS — F329 Major depressive disorder, single episode, unspecified: Secondary | ICD-10-CM | POA: Diagnosis not present

## 2017-05-03 DIAGNOSIS — I69354 Hemiplegia and hemiparesis following cerebral infarction affecting left non-dominant side: Secondary | ICD-10-CM | POA: Diagnosis not present

## 2017-05-03 DIAGNOSIS — F419 Anxiety disorder, unspecified: Secondary | ICD-10-CM | POA: Diagnosis not present

## 2017-05-03 DIAGNOSIS — I69391 Dysphagia following cerebral infarction: Secondary | ICD-10-CM | POA: Diagnosis not present

## 2017-05-03 DIAGNOSIS — F209 Schizophrenia, unspecified: Secondary | ICD-10-CM | POA: Diagnosis not present

## 2017-05-06 DIAGNOSIS — F209 Schizophrenia, unspecified: Secondary | ICD-10-CM | POA: Diagnosis not present

## 2017-05-06 DIAGNOSIS — F419 Anxiety disorder, unspecified: Secondary | ICD-10-CM | POA: Diagnosis not present

## 2017-05-06 DIAGNOSIS — I69391 Dysphagia following cerebral infarction: Secondary | ICD-10-CM | POA: Diagnosis not present

## 2017-05-06 DIAGNOSIS — F329 Major depressive disorder, single episode, unspecified: Secondary | ICD-10-CM | POA: Diagnosis not present

## 2017-05-06 DIAGNOSIS — R1310 Dysphagia, unspecified: Secondary | ICD-10-CM | POA: Diagnosis not present

## 2017-05-06 DIAGNOSIS — I69354 Hemiplegia and hemiparesis following cerebral infarction affecting left non-dominant side: Secondary | ICD-10-CM | POA: Diagnosis not present

## 2017-05-07 DIAGNOSIS — F209 Schizophrenia, unspecified: Secondary | ICD-10-CM | POA: Diagnosis not present

## 2017-05-07 DIAGNOSIS — I69354 Hemiplegia and hemiparesis following cerebral infarction affecting left non-dominant side: Secondary | ICD-10-CM | POA: Diagnosis not present

## 2017-05-07 DIAGNOSIS — I69391 Dysphagia following cerebral infarction: Secondary | ICD-10-CM | POA: Diagnosis not present

## 2017-05-07 DIAGNOSIS — F419 Anxiety disorder, unspecified: Secondary | ICD-10-CM | POA: Diagnosis not present

## 2017-05-07 DIAGNOSIS — F329 Major depressive disorder, single episode, unspecified: Secondary | ICD-10-CM | POA: Diagnosis not present

## 2017-05-07 DIAGNOSIS — R1310 Dysphagia, unspecified: Secondary | ICD-10-CM | POA: Diagnosis not present

## 2017-05-08 DIAGNOSIS — I69391 Dysphagia following cerebral infarction: Secondary | ICD-10-CM | POA: Diagnosis not present

## 2017-05-08 DIAGNOSIS — F209 Schizophrenia, unspecified: Secondary | ICD-10-CM | POA: Diagnosis not present

## 2017-05-08 DIAGNOSIS — F329 Major depressive disorder, single episode, unspecified: Secondary | ICD-10-CM | POA: Diagnosis not present

## 2017-05-08 DIAGNOSIS — F419 Anxiety disorder, unspecified: Secondary | ICD-10-CM | POA: Diagnosis not present

## 2017-05-08 DIAGNOSIS — I69354 Hemiplegia and hemiparesis following cerebral infarction affecting left non-dominant side: Secondary | ICD-10-CM | POA: Diagnosis not present

## 2017-05-08 DIAGNOSIS — R1310 Dysphagia, unspecified: Secondary | ICD-10-CM | POA: Diagnosis not present

## 2017-05-09 DIAGNOSIS — F209 Schizophrenia, unspecified: Secondary | ICD-10-CM | POA: Diagnosis not present

## 2017-05-09 DIAGNOSIS — F329 Major depressive disorder, single episode, unspecified: Secondary | ICD-10-CM | POA: Diagnosis not present

## 2017-05-09 DIAGNOSIS — F419 Anxiety disorder, unspecified: Secondary | ICD-10-CM | POA: Diagnosis not present

## 2017-05-09 DIAGNOSIS — I69354 Hemiplegia and hemiparesis following cerebral infarction affecting left non-dominant side: Secondary | ICD-10-CM | POA: Diagnosis not present

## 2017-05-09 DIAGNOSIS — R1310 Dysphagia, unspecified: Secondary | ICD-10-CM | POA: Diagnosis not present

## 2017-05-09 DIAGNOSIS — I69391 Dysphagia following cerebral infarction: Secondary | ICD-10-CM | POA: Diagnosis not present

## 2017-05-10 DIAGNOSIS — I69354 Hemiplegia and hemiparesis following cerebral infarction affecting left non-dominant side: Secondary | ICD-10-CM | POA: Diagnosis not present

## 2017-05-10 DIAGNOSIS — F329 Major depressive disorder, single episode, unspecified: Secondary | ICD-10-CM | POA: Diagnosis not present

## 2017-05-10 DIAGNOSIS — I69391 Dysphagia following cerebral infarction: Secondary | ICD-10-CM | POA: Diagnosis not present

## 2017-05-10 DIAGNOSIS — F209 Schizophrenia, unspecified: Secondary | ICD-10-CM | POA: Diagnosis not present

## 2017-05-10 DIAGNOSIS — F419 Anxiety disorder, unspecified: Secondary | ICD-10-CM | POA: Diagnosis not present

## 2017-05-10 DIAGNOSIS — R1310 Dysphagia, unspecified: Secondary | ICD-10-CM | POA: Diagnosis not present

## 2017-05-13 DIAGNOSIS — I69391 Dysphagia following cerebral infarction: Secondary | ICD-10-CM | POA: Diagnosis not present

## 2017-05-13 DIAGNOSIS — I69354 Hemiplegia and hemiparesis following cerebral infarction affecting left non-dominant side: Secondary | ICD-10-CM | POA: Diagnosis not present

## 2017-05-13 DIAGNOSIS — F419 Anxiety disorder, unspecified: Secondary | ICD-10-CM | POA: Diagnosis not present

## 2017-05-13 DIAGNOSIS — F209 Schizophrenia, unspecified: Secondary | ICD-10-CM | POA: Diagnosis not present

## 2017-05-13 DIAGNOSIS — R1310 Dysphagia, unspecified: Secondary | ICD-10-CM | POA: Diagnosis not present

## 2017-05-13 DIAGNOSIS — F329 Major depressive disorder, single episode, unspecified: Secondary | ICD-10-CM | POA: Diagnosis not present

## 2017-05-14 DIAGNOSIS — F209 Schizophrenia, unspecified: Secondary | ICD-10-CM | POA: Diagnosis not present

## 2017-05-14 DIAGNOSIS — I69391 Dysphagia following cerebral infarction: Secondary | ICD-10-CM | POA: Diagnosis not present

## 2017-05-14 DIAGNOSIS — F329 Major depressive disorder, single episode, unspecified: Secondary | ICD-10-CM | POA: Diagnosis not present

## 2017-05-14 DIAGNOSIS — R1310 Dysphagia, unspecified: Secondary | ICD-10-CM | POA: Diagnosis not present

## 2017-05-14 DIAGNOSIS — I69354 Hemiplegia and hemiparesis following cerebral infarction affecting left non-dominant side: Secondary | ICD-10-CM | POA: Diagnosis not present

## 2017-05-14 DIAGNOSIS — F419 Anxiety disorder, unspecified: Secondary | ICD-10-CM | POA: Diagnosis not present

## 2017-05-15 DIAGNOSIS — I69391 Dysphagia following cerebral infarction: Secondary | ICD-10-CM | POA: Diagnosis not present

## 2017-05-15 DIAGNOSIS — F209 Schizophrenia, unspecified: Secondary | ICD-10-CM | POA: Diagnosis not present

## 2017-05-15 DIAGNOSIS — I69354 Hemiplegia and hemiparesis following cerebral infarction affecting left non-dominant side: Secondary | ICD-10-CM | POA: Diagnosis not present

## 2017-05-15 DIAGNOSIS — R1310 Dysphagia, unspecified: Secondary | ICD-10-CM | POA: Diagnosis not present

## 2017-05-15 DIAGNOSIS — F329 Major depressive disorder, single episode, unspecified: Secondary | ICD-10-CM | POA: Diagnosis not present

## 2017-05-15 DIAGNOSIS — F419 Anxiety disorder, unspecified: Secondary | ICD-10-CM | POA: Diagnosis not present

## 2017-05-16 DIAGNOSIS — F209 Schizophrenia, unspecified: Secondary | ICD-10-CM | POA: Diagnosis not present

## 2017-05-16 DIAGNOSIS — F419 Anxiety disorder, unspecified: Secondary | ICD-10-CM | POA: Diagnosis not present

## 2017-05-16 DIAGNOSIS — I69354 Hemiplegia and hemiparesis following cerebral infarction affecting left non-dominant side: Secondary | ICD-10-CM | POA: Diagnosis not present

## 2017-05-16 DIAGNOSIS — I69391 Dysphagia following cerebral infarction: Secondary | ICD-10-CM | POA: Diagnosis not present

## 2017-05-16 DIAGNOSIS — F329 Major depressive disorder, single episode, unspecified: Secondary | ICD-10-CM | POA: Diagnosis not present

## 2017-05-16 DIAGNOSIS — R1310 Dysphagia, unspecified: Secondary | ICD-10-CM | POA: Diagnosis not present

## 2017-05-17 DIAGNOSIS — I69391 Dysphagia following cerebral infarction: Secondary | ICD-10-CM | POA: Diagnosis not present

## 2017-05-17 DIAGNOSIS — F329 Major depressive disorder, single episode, unspecified: Secondary | ICD-10-CM | POA: Diagnosis not present

## 2017-05-17 DIAGNOSIS — I69354 Hemiplegia and hemiparesis following cerebral infarction affecting left non-dominant side: Secondary | ICD-10-CM | POA: Diagnosis not present

## 2017-05-17 DIAGNOSIS — F419 Anxiety disorder, unspecified: Secondary | ICD-10-CM | POA: Diagnosis not present

## 2017-05-17 DIAGNOSIS — R1310 Dysphagia, unspecified: Secondary | ICD-10-CM | POA: Diagnosis not present

## 2017-05-17 DIAGNOSIS — F209 Schizophrenia, unspecified: Secondary | ICD-10-CM | POA: Diagnosis not present

## 2017-05-20 DIAGNOSIS — R1310 Dysphagia, unspecified: Secondary | ICD-10-CM | POA: Diagnosis not present

## 2017-05-20 DIAGNOSIS — F329 Major depressive disorder, single episode, unspecified: Secondary | ICD-10-CM | POA: Diagnosis not present

## 2017-05-20 DIAGNOSIS — F209 Schizophrenia, unspecified: Secondary | ICD-10-CM | POA: Diagnosis not present

## 2017-05-20 DIAGNOSIS — F419 Anxiety disorder, unspecified: Secondary | ICD-10-CM | POA: Diagnosis not present

## 2017-05-20 DIAGNOSIS — I69354 Hemiplegia and hemiparesis following cerebral infarction affecting left non-dominant side: Secondary | ICD-10-CM | POA: Diagnosis not present

## 2017-05-20 DIAGNOSIS — I69391 Dysphagia following cerebral infarction: Secondary | ICD-10-CM | POA: Diagnosis not present

## 2017-05-21 DIAGNOSIS — F329 Major depressive disorder, single episode, unspecified: Secondary | ICD-10-CM | POA: Diagnosis not present

## 2017-05-21 DIAGNOSIS — I69354 Hemiplegia and hemiparesis following cerebral infarction affecting left non-dominant side: Secondary | ICD-10-CM | POA: Diagnosis not present

## 2017-05-21 DIAGNOSIS — F419 Anxiety disorder, unspecified: Secondary | ICD-10-CM | POA: Diagnosis not present

## 2017-05-21 DIAGNOSIS — F209 Schizophrenia, unspecified: Secondary | ICD-10-CM | POA: Diagnosis not present

## 2017-05-21 DIAGNOSIS — I69391 Dysphagia following cerebral infarction: Secondary | ICD-10-CM | POA: Diagnosis not present

## 2017-05-21 DIAGNOSIS — R1310 Dysphagia, unspecified: Secondary | ICD-10-CM | POA: Diagnosis not present

## 2017-05-22 DIAGNOSIS — I69391 Dysphagia following cerebral infarction: Secondary | ICD-10-CM | POA: Diagnosis not present

## 2017-05-22 DIAGNOSIS — F329 Major depressive disorder, single episode, unspecified: Secondary | ICD-10-CM | POA: Diagnosis not present

## 2017-05-22 DIAGNOSIS — I69354 Hemiplegia and hemiparesis following cerebral infarction affecting left non-dominant side: Secondary | ICD-10-CM | POA: Diagnosis not present

## 2017-05-22 DIAGNOSIS — G40901 Epilepsy, unspecified, not intractable, with status epilepticus: Secondary | ICD-10-CM | POA: Diagnosis not present

## 2017-05-22 DIAGNOSIS — J449 Chronic obstructive pulmonary disease, unspecified: Secondary | ICD-10-CM | POA: Diagnosis not present

## 2017-05-22 DIAGNOSIS — I69352 Hemiplegia and hemiparesis following cerebral infarction affecting left dominant side: Secondary | ICD-10-CM | POA: Diagnosis not present

## 2017-05-22 DIAGNOSIS — F209 Schizophrenia, unspecified: Secondary | ICD-10-CM | POA: Diagnosis not present

## 2017-05-22 DIAGNOSIS — R1310 Dysphagia, unspecified: Secondary | ICD-10-CM | POA: Diagnosis not present

## 2017-05-22 DIAGNOSIS — F419 Anxiety disorder, unspecified: Secondary | ICD-10-CM | POA: Diagnosis not present

## 2017-05-23 DIAGNOSIS — F209 Schizophrenia, unspecified: Secondary | ICD-10-CM | POA: Diagnosis not present

## 2017-05-23 DIAGNOSIS — I69354 Hemiplegia and hemiparesis following cerebral infarction affecting left non-dominant side: Secondary | ICD-10-CM | POA: Diagnosis not present

## 2017-05-23 DIAGNOSIS — F329 Major depressive disorder, single episode, unspecified: Secondary | ICD-10-CM | POA: Diagnosis not present

## 2017-05-23 DIAGNOSIS — I69391 Dysphagia following cerebral infarction: Secondary | ICD-10-CM | POA: Diagnosis not present

## 2017-05-23 DIAGNOSIS — R1310 Dysphagia, unspecified: Secondary | ICD-10-CM | POA: Diagnosis not present

## 2017-05-23 DIAGNOSIS — F419 Anxiety disorder, unspecified: Secondary | ICD-10-CM | POA: Diagnosis not present

## 2017-05-24 DIAGNOSIS — R1310 Dysphagia, unspecified: Secondary | ICD-10-CM | POA: Diagnosis not present

## 2017-05-24 DIAGNOSIS — I69354 Hemiplegia and hemiparesis following cerebral infarction affecting left non-dominant side: Secondary | ICD-10-CM | POA: Diagnosis not present

## 2017-05-24 DIAGNOSIS — I69391 Dysphagia following cerebral infarction: Secondary | ICD-10-CM | POA: Diagnosis not present

## 2017-05-24 DIAGNOSIS — F209 Schizophrenia, unspecified: Secondary | ICD-10-CM | POA: Diagnosis not present

## 2017-05-24 DIAGNOSIS — F329 Major depressive disorder, single episode, unspecified: Secondary | ICD-10-CM | POA: Diagnosis not present

## 2017-05-24 DIAGNOSIS — F419 Anxiety disorder, unspecified: Secondary | ICD-10-CM | POA: Diagnosis not present

## 2017-05-27 DIAGNOSIS — I69354 Hemiplegia and hemiparesis following cerebral infarction affecting left non-dominant side: Secondary | ICD-10-CM | POA: Diagnosis not present

## 2017-05-27 DIAGNOSIS — F329 Major depressive disorder, single episode, unspecified: Secondary | ICD-10-CM | POA: Diagnosis not present

## 2017-05-27 DIAGNOSIS — I69391 Dysphagia following cerebral infarction: Secondary | ICD-10-CM | POA: Diagnosis not present

## 2017-05-27 DIAGNOSIS — F419 Anxiety disorder, unspecified: Secondary | ICD-10-CM | POA: Diagnosis not present

## 2017-05-27 DIAGNOSIS — F209 Schizophrenia, unspecified: Secondary | ICD-10-CM | POA: Diagnosis not present

## 2017-05-27 DIAGNOSIS — R1310 Dysphagia, unspecified: Secondary | ICD-10-CM | POA: Diagnosis not present

## 2017-05-28 DIAGNOSIS — F209 Schizophrenia, unspecified: Secondary | ICD-10-CM | POA: Diagnosis not present

## 2017-05-28 DIAGNOSIS — F329 Major depressive disorder, single episode, unspecified: Secondary | ICD-10-CM | POA: Diagnosis not present

## 2017-05-28 DIAGNOSIS — F419 Anxiety disorder, unspecified: Secondary | ICD-10-CM | POA: Diagnosis not present

## 2017-05-28 DIAGNOSIS — I69354 Hemiplegia and hemiparesis following cerebral infarction affecting left non-dominant side: Secondary | ICD-10-CM | POA: Diagnosis not present

## 2017-05-28 DIAGNOSIS — I69391 Dysphagia following cerebral infarction: Secondary | ICD-10-CM | POA: Diagnosis not present

## 2017-05-28 DIAGNOSIS — R1310 Dysphagia, unspecified: Secondary | ICD-10-CM | POA: Diagnosis not present

## 2017-05-29 DIAGNOSIS — F329 Major depressive disorder, single episode, unspecified: Secondary | ICD-10-CM | POA: Diagnosis not present

## 2017-05-29 DIAGNOSIS — I69354 Hemiplegia and hemiparesis following cerebral infarction affecting left non-dominant side: Secondary | ICD-10-CM | POA: Diagnosis not present

## 2017-05-29 DIAGNOSIS — I69391 Dysphagia following cerebral infarction: Secondary | ICD-10-CM | POA: Diagnosis not present

## 2017-05-29 DIAGNOSIS — R1310 Dysphagia, unspecified: Secondary | ICD-10-CM | POA: Diagnosis not present

## 2017-05-29 DIAGNOSIS — F419 Anxiety disorder, unspecified: Secondary | ICD-10-CM | POA: Diagnosis not present

## 2017-05-29 DIAGNOSIS — F209 Schizophrenia, unspecified: Secondary | ICD-10-CM | POA: Diagnosis not present

## 2017-05-30 DIAGNOSIS — R1310 Dysphagia, unspecified: Secondary | ICD-10-CM | POA: Diagnosis not present

## 2017-05-30 DIAGNOSIS — F209 Schizophrenia, unspecified: Secondary | ICD-10-CM | POA: Diagnosis not present

## 2017-05-30 DIAGNOSIS — F329 Major depressive disorder, single episode, unspecified: Secondary | ICD-10-CM | POA: Diagnosis not present

## 2017-05-30 DIAGNOSIS — I69354 Hemiplegia and hemiparesis following cerebral infarction affecting left non-dominant side: Secondary | ICD-10-CM | POA: Diagnosis not present

## 2017-05-30 DIAGNOSIS — I69391 Dysphagia following cerebral infarction: Secondary | ICD-10-CM | POA: Diagnosis not present

## 2017-05-30 DIAGNOSIS — F419 Anxiety disorder, unspecified: Secondary | ICD-10-CM | POA: Diagnosis not present

## 2017-05-31 DIAGNOSIS — F209 Schizophrenia, unspecified: Secondary | ICD-10-CM | POA: Diagnosis not present

## 2017-05-31 DIAGNOSIS — F329 Major depressive disorder, single episode, unspecified: Secondary | ICD-10-CM | POA: Diagnosis not present

## 2017-05-31 DIAGNOSIS — F419 Anxiety disorder, unspecified: Secondary | ICD-10-CM | POA: Diagnosis not present

## 2017-05-31 DIAGNOSIS — R1310 Dysphagia, unspecified: Secondary | ICD-10-CM | POA: Diagnosis not present

## 2017-05-31 DIAGNOSIS — I69391 Dysphagia following cerebral infarction: Secondary | ICD-10-CM | POA: Diagnosis not present

## 2017-05-31 DIAGNOSIS — I69354 Hemiplegia and hemiparesis following cerebral infarction affecting left non-dominant side: Secondary | ICD-10-CM | POA: Diagnosis not present

## 2017-06-01 DIAGNOSIS — I69320 Aphasia following cerebral infarction: Secondary | ICD-10-CM | POA: Diagnosis not present

## 2017-06-01 DIAGNOSIS — R1310 Dysphagia, unspecified: Secondary | ICD-10-CM | POA: Diagnosis not present

## 2017-06-01 DIAGNOSIS — G8929 Other chronic pain: Secondary | ICD-10-CM | POA: Diagnosis not present

## 2017-06-01 DIAGNOSIS — F209 Schizophrenia, unspecified: Secondary | ICD-10-CM | POA: Diagnosis not present

## 2017-06-01 DIAGNOSIS — J449 Chronic obstructive pulmonary disease, unspecified: Secondary | ICD-10-CM | POA: Diagnosis not present

## 2017-06-01 DIAGNOSIS — Z9981 Dependence on supplemental oxygen: Secondary | ICD-10-CM | POA: Diagnosis not present

## 2017-06-01 DIAGNOSIS — F329 Major depressive disorder, single episode, unspecified: Secondary | ICD-10-CM | POA: Diagnosis not present

## 2017-06-01 DIAGNOSIS — M810 Age-related osteoporosis without current pathological fracture: Secondary | ICD-10-CM | POA: Diagnosis not present

## 2017-06-01 DIAGNOSIS — K219 Gastro-esophageal reflux disease without esophagitis: Secondary | ICD-10-CM | POA: Diagnosis not present

## 2017-06-01 DIAGNOSIS — Z7401 Bed confinement status: Secondary | ICD-10-CM | POA: Diagnosis not present

## 2017-06-01 DIAGNOSIS — I69391 Dysphagia following cerebral infarction: Secondary | ICD-10-CM | POA: Diagnosis not present

## 2017-06-01 DIAGNOSIS — F419 Anxiety disorder, unspecified: Secondary | ICD-10-CM | POA: Diagnosis not present

## 2017-06-01 DIAGNOSIS — Z431 Encounter for attention to gastrostomy: Secondary | ICD-10-CM | POA: Diagnosis not present

## 2017-06-01 DIAGNOSIS — I69354 Hemiplegia and hemiparesis following cerebral infarction affecting left non-dominant side: Secondary | ICD-10-CM | POA: Diagnosis not present

## 2017-06-01 DIAGNOSIS — L89620 Pressure ulcer of left heel, unstageable: Secondary | ICD-10-CM | POA: Diagnosis not present

## 2017-06-03 DIAGNOSIS — I69354 Hemiplegia and hemiparesis following cerebral infarction affecting left non-dominant side: Secondary | ICD-10-CM | POA: Diagnosis not present

## 2017-06-03 DIAGNOSIS — R1310 Dysphagia, unspecified: Secondary | ICD-10-CM | POA: Diagnosis not present

## 2017-06-03 DIAGNOSIS — F209 Schizophrenia, unspecified: Secondary | ICD-10-CM | POA: Diagnosis not present

## 2017-06-03 DIAGNOSIS — I69391 Dysphagia following cerebral infarction: Secondary | ICD-10-CM | POA: Diagnosis not present

## 2017-06-03 DIAGNOSIS — F419 Anxiety disorder, unspecified: Secondary | ICD-10-CM | POA: Diagnosis not present

## 2017-06-03 DIAGNOSIS — F329 Major depressive disorder, single episode, unspecified: Secondary | ICD-10-CM | POA: Diagnosis not present

## 2017-06-04 DIAGNOSIS — F209 Schizophrenia, unspecified: Secondary | ICD-10-CM | POA: Diagnosis not present

## 2017-06-04 DIAGNOSIS — F419 Anxiety disorder, unspecified: Secondary | ICD-10-CM | POA: Diagnosis not present

## 2017-06-04 DIAGNOSIS — I69391 Dysphagia following cerebral infarction: Secondary | ICD-10-CM | POA: Diagnosis not present

## 2017-06-04 DIAGNOSIS — F329 Major depressive disorder, single episode, unspecified: Secondary | ICD-10-CM | POA: Diagnosis not present

## 2017-06-04 DIAGNOSIS — I69354 Hemiplegia and hemiparesis following cerebral infarction affecting left non-dominant side: Secondary | ICD-10-CM | POA: Diagnosis not present

## 2017-06-04 DIAGNOSIS — R1310 Dysphagia, unspecified: Secondary | ICD-10-CM | POA: Diagnosis not present

## 2017-06-05 DIAGNOSIS — I69354 Hemiplegia and hemiparesis following cerebral infarction affecting left non-dominant side: Secondary | ICD-10-CM | POA: Diagnosis not present

## 2017-06-05 DIAGNOSIS — I69391 Dysphagia following cerebral infarction: Secondary | ICD-10-CM | POA: Diagnosis not present

## 2017-06-05 DIAGNOSIS — R1310 Dysphagia, unspecified: Secondary | ICD-10-CM | POA: Diagnosis not present

## 2017-06-05 DIAGNOSIS — F329 Major depressive disorder, single episode, unspecified: Secondary | ICD-10-CM | POA: Diagnosis not present

## 2017-06-05 DIAGNOSIS — F209 Schizophrenia, unspecified: Secondary | ICD-10-CM | POA: Diagnosis not present

## 2017-06-05 DIAGNOSIS — F419 Anxiety disorder, unspecified: Secondary | ICD-10-CM | POA: Diagnosis not present

## 2017-06-06 DIAGNOSIS — F329 Major depressive disorder, single episode, unspecified: Secondary | ICD-10-CM | POA: Diagnosis not present

## 2017-06-06 DIAGNOSIS — F419 Anxiety disorder, unspecified: Secondary | ICD-10-CM | POA: Diagnosis not present

## 2017-06-06 DIAGNOSIS — I69391 Dysphagia following cerebral infarction: Secondary | ICD-10-CM | POA: Diagnosis not present

## 2017-06-06 DIAGNOSIS — F209 Schizophrenia, unspecified: Secondary | ICD-10-CM | POA: Diagnosis not present

## 2017-06-06 DIAGNOSIS — R1310 Dysphagia, unspecified: Secondary | ICD-10-CM | POA: Diagnosis not present

## 2017-06-06 DIAGNOSIS — I69354 Hemiplegia and hemiparesis following cerebral infarction affecting left non-dominant side: Secondary | ICD-10-CM | POA: Diagnosis not present

## 2017-06-07 DIAGNOSIS — F209 Schizophrenia, unspecified: Secondary | ICD-10-CM | POA: Diagnosis not present

## 2017-06-07 DIAGNOSIS — R1310 Dysphagia, unspecified: Secondary | ICD-10-CM | POA: Diagnosis not present

## 2017-06-07 DIAGNOSIS — I69354 Hemiplegia and hemiparesis following cerebral infarction affecting left non-dominant side: Secondary | ICD-10-CM | POA: Diagnosis not present

## 2017-06-07 DIAGNOSIS — F419 Anxiety disorder, unspecified: Secondary | ICD-10-CM | POA: Diagnosis not present

## 2017-06-07 DIAGNOSIS — I69391 Dysphagia following cerebral infarction: Secondary | ICD-10-CM | POA: Diagnosis not present

## 2017-06-07 DIAGNOSIS — F329 Major depressive disorder, single episode, unspecified: Secondary | ICD-10-CM | POA: Diagnosis not present

## 2017-06-10 DIAGNOSIS — F209 Schizophrenia, unspecified: Secondary | ICD-10-CM | POA: Diagnosis not present

## 2017-06-10 DIAGNOSIS — F329 Major depressive disorder, single episode, unspecified: Secondary | ICD-10-CM | POA: Diagnosis not present

## 2017-06-10 DIAGNOSIS — I69354 Hemiplegia and hemiparesis following cerebral infarction affecting left non-dominant side: Secondary | ICD-10-CM | POA: Diagnosis not present

## 2017-06-10 DIAGNOSIS — I69391 Dysphagia following cerebral infarction: Secondary | ICD-10-CM | POA: Diagnosis not present

## 2017-06-10 DIAGNOSIS — F419 Anxiety disorder, unspecified: Secondary | ICD-10-CM | POA: Diagnosis not present

## 2017-06-10 DIAGNOSIS — R1310 Dysphagia, unspecified: Secondary | ICD-10-CM | POA: Diagnosis not present

## 2017-06-12 DIAGNOSIS — I69354 Hemiplegia and hemiparesis following cerebral infarction affecting left non-dominant side: Secondary | ICD-10-CM | POA: Diagnosis not present

## 2017-06-12 DIAGNOSIS — F419 Anxiety disorder, unspecified: Secondary | ICD-10-CM | POA: Diagnosis not present

## 2017-06-12 DIAGNOSIS — F329 Major depressive disorder, single episode, unspecified: Secondary | ICD-10-CM | POA: Diagnosis not present

## 2017-06-12 DIAGNOSIS — R1310 Dysphagia, unspecified: Secondary | ICD-10-CM | POA: Diagnosis not present

## 2017-06-12 DIAGNOSIS — I69391 Dysphagia following cerebral infarction: Secondary | ICD-10-CM | POA: Diagnosis not present

## 2017-06-12 DIAGNOSIS — F209 Schizophrenia, unspecified: Secondary | ICD-10-CM | POA: Diagnosis not present

## 2017-06-13 DIAGNOSIS — F209 Schizophrenia, unspecified: Secondary | ICD-10-CM | POA: Diagnosis not present

## 2017-06-13 DIAGNOSIS — R1310 Dysphagia, unspecified: Secondary | ICD-10-CM | POA: Diagnosis not present

## 2017-06-13 DIAGNOSIS — I69391 Dysphagia following cerebral infarction: Secondary | ICD-10-CM | POA: Diagnosis not present

## 2017-06-13 DIAGNOSIS — F329 Major depressive disorder, single episode, unspecified: Secondary | ICD-10-CM | POA: Diagnosis not present

## 2017-06-13 DIAGNOSIS — I69354 Hemiplegia and hemiparesis following cerebral infarction affecting left non-dominant side: Secondary | ICD-10-CM | POA: Diagnosis not present

## 2017-06-13 DIAGNOSIS — F419 Anxiety disorder, unspecified: Secondary | ICD-10-CM | POA: Diagnosis not present

## 2017-06-14 DIAGNOSIS — F329 Major depressive disorder, single episode, unspecified: Secondary | ICD-10-CM | POA: Diagnosis not present

## 2017-06-14 DIAGNOSIS — I69391 Dysphagia following cerebral infarction: Secondary | ICD-10-CM | POA: Diagnosis not present

## 2017-06-14 DIAGNOSIS — F419 Anxiety disorder, unspecified: Secondary | ICD-10-CM | POA: Diagnosis not present

## 2017-06-14 DIAGNOSIS — R1310 Dysphagia, unspecified: Secondary | ICD-10-CM | POA: Diagnosis not present

## 2017-06-14 DIAGNOSIS — F209 Schizophrenia, unspecified: Secondary | ICD-10-CM | POA: Diagnosis not present

## 2017-06-14 DIAGNOSIS — I69354 Hemiplegia and hemiparesis following cerebral infarction affecting left non-dominant side: Secondary | ICD-10-CM | POA: Diagnosis not present

## 2017-06-17 DIAGNOSIS — I69354 Hemiplegia and hemiparesis following cerebral infarction affecting left non-dominant side: Secondary | ICD-10-CM | POA: Diagnosis not present

## 2017-06-17 DIAGNOSIS — F419 Anxiety disorder, unspecified: Secondary | ICD-10-CM | POA: Diagnosis not present

## 2017-06-17 DIAGNOSIS — R1310 Dysphagia, unspecified: Secondary | ICD-10-CM | POA: Diagnosis not present

## 2017-06-17 DIAGNOSIS — F329 Major depressive disorder, single episode, unspecified: Secondary | ICD-10-CM | POA: Diagnosis not present

## 2017-06-17 DIAGNOSIS — I69391 Dysphagia following cerebral infarction: Secondary | ICD-10-CM | POA: Diagnosis not present

## 2017-06-17 DIAGNOSIS — F209 Schizophrenia, unspecified: Secondary | ICD-10-CM | POA: Diagnosis not present

## 2017-06-18 DIAGNOSIS — F419 Anxiety disorder, unspecified: Secondary | ICD-10-CM | POA: Diagnosis not present

## 2017-06-18 DIAGNOSIS — R1310 Dysphagia, unspecified: Secondary | ICD-10-CM | POA: Diagnosis not present

## 2017-06-18 DIAGNOSIS — I69391 Dysphagia following cerebral infarction: Secondary | ICD-10-CM | POA: Diagnosis not present

## 2017-06-18 DIAGNOSIS — I69354 Hemiplegia and hemiparesis following cerebral infarction affecting left non-dominant side: Secondary | ICD-10-CM | POA: Diagnosis not present

## 2017-06-18 DIAGNOSIS — F209 Schizophrenia, unspecified: Secondary | ICD-10-CM | POA: Diagnosis not present

## 2017-06-18 DIAGNOSIS — F329 Major depressive disorder, single episode, unspecified: Secondary | ICD-10-CM | POA: Diagnosis not present

## 2017-06-19 DIAGNOSIS — I69391 Dysphagia following cerebral infarction: Secondary | ICD-10-CM | POA: Diagnosis not present

## 2017-06-19 DIAGNOSIS — F329 Major depressive disorder, single episode, unspecified: Secondary | ICD-10-CM | POA: Diagnosis not present

## 2017-06-19 DIAGNOSIS — I69354 Hemiplegia and hemiparesis following cerebral infarction affecting left non-dominant side: Secondary | ICD-10-CM | POA: Diagnosis not present

## 2017-06-19 DIAGNOSIS — H1045 Other chronic allergic conjunctivitis: Secondary | ICD-10-CM | POA: Diagnosis not present

## 2017-06-19 DIAGNOSIS — F419 Anxiety disorder, unspecified: Secondary | ICD-10-CM | POA: Diagnosis not present

## 2017-06-19 DIAGNOSIS — F209 Schizophrenia, unspecified: Secondary | ICD-10-CM | POA: Diagnosis not present

## 2017-06-19 DIAGNOSIS — R1310 Dysphagia, unspecified: Secondary | ICD-10-CM | POA: Diagnosis not present

## 2017-06-20 DIAGNOSIS — F209 Schizophrenia, unspecified: Secondary | ICD-10-CM | POA: Diagnosis not present

## 2017-06-20 DIAGNOSIS — R1310 Dysphagia, unspecified: Secondary | ICD-10-CM | POA: Diagnosis not present

## 2017-06-20 DIAGNOSIS — F419 Anxiety disorder, unspecified: Secondary | ICD-10-CM | POA: Diagnosis not present

## 2017-06-20 DIAGNOSIS — I69354 Hemiplegia and hemiparesis following cerebral infarction affecting left non-dominant side: Secondary | ICD-10-CM | POA: Diagnosis not present

## 2017-06-20 DIAGNOSIS — F329 Major depressive disorder, single episode, unspecified: Secondary | ICD-10-CM | POA: Diagnosis not present

## 2017-06-20 DIAGNOSIS — I69391 Dysphagia following cerebral infarction: Secondary | ICD-10-CM | POA: Diagnosis not present

## 2017-06-21 DIAGNOSIS — I69354 Hemiplegia and hemiparesis following cerebral infarction affecting left non-dominant side: Secondary | ICD-10-CM | POA: Diagnosis not present

## 2017-06-21 DIAGNOSIS — F329 Major depressive disorder, single episode, unspecified: Secondary | ICD-10-CM | POA: Diagnosis not present

## 2017-06-21 DIAGNOSIS — F209 Schizophrenia, unspecified: Secondary | ICD-10-CM | POA: Diagnosis not present

## 2017-06-21 DIAGNOSIS — F419 Anxiety disorder, unspecified: Secondary | ICD-10-CM | POA: Diagnosis not present

## 2017-06-21 DIAGNOSIS — I69391 Dysphagia following cerebral infarction: Secondary | ICD-10-CM | POA: Diagnosis not present

## 2017-06-21 DIAGNOSIS — R1310 Dysphagia, unspecified: Secondary | ICD-10-CM | POA: Diagnosis not present

## 2017-06-24 DIAGNOSIS — F419 Anxiety disorder, unspecified: Secondary | ICD-10-CM | POA: Diagnosis not present

## 2017-06-24 DIAGNOSIS — I69354 Hemiplegia and hemiparesis following cerebral infarction affecting left non-dominant side: Secondary | ICD-10-CM | POA: Diagnosis not present

## 2017-06-24 DIAGNOSIS — R1310 Dysphagia, unspecified: Secondary | ICD-10-CM | POA: Diagnosis not present

## 2017-06-24 DIAGNOSIS — F209 Schizophrenia, unspecified: Secondary | ICD-10-CM | POA: Diagnosis not present

## 2017-06-24 DIAGNOSIS — F329 Major depressive disorder, single episode, unspecified: Secondary | ICD-10-CM | POA: Diagnosis not present

## 2017-06-24 DIAGNOSIS — I69391 Dysphagia following cerebral infarction: Secondary | ICD-10-CM | POA: Diagnosis not present

## 2017-06-25 DIAGNOSIS — F419 Anxiety disorder, unspecified: Secondary | ICD-10-CM | POA: Diagnosis not present

## 2017-06-25 DIAGNOSIS — I69354 Hemiplegia and hemiparesis following cerebral infarction affecting left non-dominant side: Secondary | ICD-10-CM | POA: Diagnosis not present

## 2017-06-25 DIAGNOSIS — F329 Major depressive disorder, single episode, unspecified: Secondary | ICD-10-CM | POA: Diagnosis not present

## 2017-06-25 DIAGNOSIS — R1310 Dysphagia, unspecified: Secondary | ICD-10-CM | POA: Diagnosis not present

## 2017-06-25 DIAGNOSIS — F209 Schizophrenia, unspecified: Secondary | ICD-10-CM | POA: Diagnosis not present

## 2017-06-25 DIAGNOSIS — I69391 Dysphagia following cerebral infarction: Secondary | ICD-10-CM | POA: Diagnosis not present

## 2017-06-26 DIAGNOSIS — I69391 Dysphagia following cerebral infarction: Secondary | ICD-10-CM | POA: Diagnosis not present

## 2017-06-26 DIAGNOSIS — F419 Anxiety disorder, unspecified: Secondary | ICD-10-CM | POA: Diagnosis not present

## 2017-06-26 DIAGNOSIS — R1310 Dysphagia, unspecified: Secondary | ICD-10-CM | POA: Diagnosis not present

## 2017-06-26 DIAGNOSIS — I69354 Hemiplegia and hemiparesis following cerebral infarction affecting left non-dominant side: Secondary | ICD-10-CM | POA: Diagnosis not present

## 2017-06-26 DIAGNOSIS — F329 Major depressive disorder, single episode, unspecified: Secondary | ICD-10-CM | POA: Diagnosis not present

## 2017-06-26 DIAGNOSIS — F209 Schizophrenia, unspecified: Secondary | ICD-10-CM | POA: Diagnosis not present

## 2017-06-27 DIAGNOSIS — F209 Schizophrenia, unspecified: Secondary | ICD-10-CM | POA: Diagnosis not present

## 2017-06-27 DIAGNOSIS — F329 Major depressive disorder, single episode, unspecified: Secondary | ICD-10-CM | POA: Diagnosis not present

## 2017-06-27 DIAGNOSIS — I69354 Hemiplegia and hemiparesis following cerebral infarction affecting left non-dominant side: Secondary | ICD-10-CM | POA: Diagnosis not present

## 2017-06-27 DIAGNOSIS — F419 Anxiety disorder, unspecified: Secondary | ICD-10-CM | POA: Diagnosis not present

## 2017-06-27 DIAGNOSIS — R1310 Dysphagia, unspecified: Secondary | ICD-10-CM | POA: Diagnosis not present

## 2017-06-27 DIAGNOSIS — I69391 Dysphagia following cerebral infarction: Secondary | ICD-10-CM | POA: Diagnosis not present

## 2017-06-28 DIAGNOSIS — I69391 Dysphagia following cerebral infarction: Secondary | ICD-10-CM | POA: Diagnosis not present

## 2017-06-28 DIAGNOSIS — F419 Anxiety disorder, unspecified: Secondary | ICD-10-CM | POA: Diagnosis not present

## 2017-06-28 DIAGNOSIS — I69354 Hemiplegia and hemiparesis following cerebral infarction affecting left non-dominant side: Secondary | ICD-10-CM | POA: Diagnosis not present

## 2017-06-28 DIAGNOSIS — F329 Major depressive disorder, single episode, unspecified: Secondary | ICD-10-CM | POA: Diagnosis not present

## 2017-06-28 DIAGNOSIS — R1310 Dysphagia, unspecified: Secondary | ICD-10-CM | POA: Diagnosis not present

## 2017-06-28 DIAGNOSIS — F209 Schizophrenia, unspecified: Secondary | ICD-10-CM | POA: Diagnosis not present

## 2017-07-01 DIAGNOSIS — F329 Major depressive disorder, single episode, unspecified: Secondary | ICD-10-CM | POA: Diagnosis not present

## 2017-07-01 DIAGNOSIS — F209 Schizophrenia, unspecified: Secondary | ICD-10-CM | POA: Diagnosis not present

## 2017-07-01 DIAGNOSIS — I69391 Dysphagia following cerebral infarction: Secondary | ICD-10-CM | POA: Diagnosis not present

## 2017-07-01 DIAGNOSIS — R1310 Dysphagia, unspecified: Secondary | ICD-10-CM | POA: Diagnosis not present

## 2017-07-01 DIAGNOSIS — F419 Anxiety disorder, unspecified: Secondary | ICD-10-CM | POA: Diagnosis not present

## 2017-07-01 DIAGNOSIS — I69354 Hemiplegia and hemiparesis following cerebral infarction affecting left non-dominant side: Secondary | ICD-10-CM | POA: Diagnosis not present

## 2017-07-02 ENCOUNTER — Telehealth: Payer: Self-pay | Admitting: Internal Medicine

## 2017-07-02 DIAGNOSIS — Z431 Encounter for attention to gastrostomy: Secondary | ICD-10-CM | POA: Diagnosis not present

## 2017-07-02 DIAGNOSIS — R630 Anorexia: Secondary | ICD-10-CM | POA: Diagnosis not present

## 2017-07-02 DIAGNOSIS — Z7401 Bed confinement status: Secondary | ICD-10-CM | POA: Diagnosis not present

## 2017-07-02 DIAGNOSIS — Z9981 Dependence on supplemental oxygen: Secondary | ICD-10-CM | POA: Diagnosis not present

## 2017-07-02 DIAGNOSIS — F209 Schizophrenia, unspecified: Secondary | ICD-10-CM | POA: Diagnosis not present

## 2017-07-02 DIAGNOSIS — L89620 Pressure ulcer of left heel, unstageable: Secondary | ICD-10-CM | POA: Diagnosis not present

## 2017-07-02 DIAGNOSIS — M810 Age-related osteoporosis without current pathological fracture: Secondary | ICD-10-CM | POA: Diagnosis not present

## 2017-07-02 DIAGNOSIS — G8929 Other chronic pain: Secondary | ICD-10-CM | POA: Diagnosis not present

## 2017-07-02 DIAGNOSIS — I69391 Dysphagia following cerebral infarction: Secondary | ICD-10-CM | POA: Diagnosis not present

## 2017-07-02 DIAGNOSIS — I69354 Hemiplegia and hemiparesis following cerebral infarction affecting left non-dominant side: Secondary | ICD-10-CM | POA: Diagnosis not present

## 2017-07-02 DIAGNOSIS — F329 Major depressive disorder, single episode, unspecified: Secondary | ICD-10-CM | POA: Diagnosis not present

## 2017-07-02 DIAGNOSIS — I69320 Aphasia following cerebral infarction: Secondary | ICD-10-CM | POA: Diagnosis not present

## 2017-07-02 DIAGNOSIS — F419 Anxiety disorder, unspecified: Secondary | ICD-10-CM | POA: Diagnosis not present

## 2017-07-02 DIAGNOSIS — R1310 Dysphagia, unspecified: Secondary | ICD-10-CM | POA: Diagnosis not present

## 2017-07-02 DIAGNOSIS — J449 Chronic obstructive pulmonary disease, unspecified: Secondary | ICD-10-CM | POA: Diagnosis not present

## 2017-07-02 DIAGNOSIS — K219 Gastro-esophageal reflux disease without esophagitis: Secondary | ICD-10-CM | POA: Diagnosis not present

## 2017-07-02 NOTE — Telephone Encounter (Signed)
[facility].  She has had 3 seizures in the past 24 hours, which involved her staring into space for about one minute.  This is not new.  She was on ativan in the past - the nurse mentioned this was stopped to see how she would do without it.  She called for a medication order.  Given 3 seizures in the past 24 hrs will restart ativan 0.5 mg twice daily standing for now.  Will let Dr Alphonsus SiasLetvak decide if this should be prn or not tomorrow.

## 2017-07-03 DIAGNOSIS — I69391 Dysphagia following cerebral infarction: Secondary | ICD-10-CM | POA: Diagnosis not present

## 2017-07-03 DIAGNOSIS — F419 Anxiety disorder, unspecified: Secondary | ICD-10-CM | POA: Diagnosis not present

## 2017-07-03 DIAGNOSIS — R1310 Dysphagia, unspecified: Secondary | ICD-10-CM | POA: Diagnosis not present

## 2017-07-03 DIAGNOSIS — F209 Schizophrenia, unspecified: Secondary | ICD-10-CM | POA: Diagnosis not present

## 2017-07-03 DIAGNOSIS — F329 Major depressive disorder, single episode, unspecified: Secondary | ICD-10-CM | POA: Diagnosis not present

## 2017-07-03 DIAGNOSIS — I69354 Hemiplegia and hemiparesis following cerebral infarction affecting left non-dominant side: Secondary | ICD-10-CM | POA: Diagnosis not present

## 2017-07-03 NOTE — Telephone Encounter (Signed)
PLEASE NOTE: All timestamps contained within this report are represented as [place] Standard Time. CONFIDENTIALTY NOTICE: This fax transmission is intended only for the addressee. It contains information that is legally privileged, confidential or otherwise protected from use or disclosure. If you are not the intended recipient, you are strictly prohibited from reviewing, disclosing, copying using or disseminating any of this information or taking any action in reliance on or regarding this information. If you have received this fax in error, please notify us immediately by telephone so that we can arrange for its return to us. Phone: [phone], Toll-Free: [phone], Fax: [phone] Page: 1 of 1 Call Id: [id number] [place] [facility] Night - Client Nonclinical Telephone Record [facility] Client [place] [facility] - [facility] [place] [facility] - Night Physician Tillman AbideLetvak, Richard - MD Contact Type Call Call Type Home Care Hospice Page Now Who Is Calling Home Health / Hospice Agency Caller Name Humberto [facility]Vanessa Sweger [facility] Facility Number [phone] Patient Name Diana Mccoy Patient DOB 05/17/1946 Reason for Call Request to speak to Physician Initial Comment Humberto LeepVanessa Sweger [phone] says she is a hospice nurse says that PT Diana Mccoy is having some time of seizures; she stares off into space and does not respond for about a minute. PT has had them before but is not on med, and would like authorization to call the med in. Additional Comment Paging DoctorName Phone DateTime Result/Outcome Message Type Notes Cheryll CockayneBurns, Stacy - MD [phone] 07/02/2017 7:23:31 PM Called On Call Provider - Reached Doctor Paged Cheryll CockayneBurns, Stacy - MD 07/02/2017 7:23:54 PM Spoke with On Call - General Message Result Spoke with on call and given information to contact caller. Call Closed By: Waynetta PeanAidan  Knowles Transaction Date/Time: 07/02/2017 7:14:47 PM (ET)

## 2017-07-03 NOTE — Telephone Encounter (Signed)
At [name] should be able to follow up on this if any issues

## 2017-07-03 NOTE — Telephone Encounter (Signed)
This was not mentioned to me this morning. I will follow up with RN.

## 2017-07-04 DIAGNOSIS — I69354 Hemiplegia and hemiparesis following cerebral infarction affecting left non-dominant side: Secondary | ICD-10-CM | POA: Diagnosis not present

## 2017-07-04 DIAGNOSIS — F209 Schizophrenia, unspecified: Secondary | ICD-10-CM | POA: Diagnosis not present

## 2017-07-04 DIAGNOSIS — I69391 Dysphagia following cerebral infarction: Secondary | ICD-10-CM | POA: Diagnosis not present

## 2017-07-04 DIAGNOSIS — F419 Anxiety disorder, unspecified: Secondary | ICD-10-CM | POA: Diagnosis not present

## 2017-07-04 DIAGNOSIS — F329 Major depressive disorder, single episode, unspecified: Secondary | ICD-10-CM | POA: Diagnosis not present

## 2017-07-04 DIAGNOSIS — R1310 Dysphagia, unspecified: Secondary | ICD-10-CM | POA: Diagnosis not present

## 2017-07-05 DIAGNOSIS — I69354 Hemiplegia and hemiparesis following cerebral infarction affecting left non-dominant side: Secondary | ICD-10-CM | POA: Diagnosis not present

## 2017-07-05 DIAGNOSIS — R1310 Dysphagia, unspecified: Secondary | ICD-10-CM | POA: Diagnosis not present

## 2017-07-05 DIAGNOSIS — I69391 Dysphagia following cerebral infarction: Secondary | ICD-10-CM | POA: Diagnosis not present

## 2017-07-05 DIAGNOSIS — F329 Major depressive disorder, single episode, unspecified: Secondary | ICD-10-CM | POA: Diagnosis not present

## 2017-07-05 DIAGNOSIS — F209 Schizophrenia, unspecified: Secondary | ICD-10-CM | POA: Diagnosis not present

## 2017-07-05 DIAGNOSIS — F419 Anxiety disorder, unspecified: Secondary | ICD-10-CM | POA: Diagnosis not present

## 2017-07-08 DIAGNOSIS — F209 Schizophrenia, unspecified: Secondary | ICD-10-CM | POA: Diagnosis not present

## 2017-07-08 DIAGNOSIS — R1310 Dysphagia, unspecified: Secondary | ICD-10-CM | POA: Diagnosis not present

## 2017-07-08 DIAGNOSIS — F329 Major depressive disorder, single episode, unspecified: Secondary | ICD-10-CM | POA: Diagnosis not present

## 2017-07-08 DIAGNOSIS — F419 Anxiety disorder, unspecified: Secondary | ICD-10-CM | POA: Diagnosis not present

## 2017-07-08 DIAGNOSIS — I69354 Hemiplegia and hemiparesis following cerebral infarction affecting left non-dominant side: Secondary | ICD-10-CM | POA: Diagnosis not present

## 2017-07-08 DIAGNOSIS — I69391 Dysphagia following cerebral infarction: Secondary | ICD-10-CM | POA: Diagnosis not present

## 2017-07-09 DIAGNOSIS — I69354 Hemiplegia and hemiparesis following cerebral infarction affecting left non-dominant side: Secondary | ICD-10-CM | POA: Diagnosis not present

## 2017-07-09 DIAGNOSIS — I69391 Dysphagia following cerebral infarction: Secondary | ICD-10-CM | POA: Diagnosis not present

## 2017-07-09 DIAGNOSIS — R1310 Dysphagia, unspecified: Secondary | ICD-10-CM | POA: Diagnosis not present

## 2017-07-09 DIAGNOSIS — F329 Major depressive disorder, single episode, unspecified: Secondary | ICD-10-CM | POA: Diagnosis not present

## 2017-07-09 DIAGNOSIS — F209 Schizophrenia, unspecified: Secondary | ICD-10-CM | POA: Diagnosis not present

## 2017-07-09 DIAGNOSIS — F419 Anxiety disorder, unspecified: Secondary | ICD-10-CM | POA: Diagnosis not present

## 2017-07-10 DIAGNOSIS — R1310 Dysphagia, unspecified: Secondary | ICD-10-CM | POA: Diagnosis not present

## 2017-07-10 DIAGNOSIS — I69354 Hemiplegia and hemiparesis following cerebral infarction affecting left non-dominant side: Secondary | ICD-10-CM | POA: Diagnosis not present

## 2017-07-10 DIAGNOSIS — I69391 Dysphagia following cerebral infarction: Secondary | ICD-10-CM | POA: Diagnosis not present

## 2017-07-10 DIAGNOSIS — F209 Schizophrenia, unspecified: Secondary | ICD-10-CM | POA: Diagnosis not present

## 2017-07-10 DIAGNOSIS — F329 Major depressive disorder, single episode, unspecified: Secondary | ICD-10-CM | POA: Diagnosis not present

## 2017-07-10 DIAGNOSIS — F419 Anxiety disorder, unspecified: Secondary | ICD-10-CM | POA: Diagnosis not present

## 2017-07-11 DIAGNOSIS — R1310 Dysphagia, unspecified: Secondary | ICD-10-CM | POA: Diagnosis not present

## 2017-07-11 DIAGNOSIS — J439 Emphysema, unspecified: Secondary | ICD-10-CM | POA: Diagnosis not present

## 2017-07-11 DIAGNOSIS — F329 Major depressive disorder, single episode, unspecified: Secondary | ICD-10-CM | POA: Diagnosis not present

## 2017-07-11 DIAGNOSIS — F419 Anxiety disorder, unspecified: Secondary | ICD-10-CM | POA: Diagnosis not present

## 2017-07-11 DIAGNOSIS — F209 Schizophrenia, unspecified: Secondary | ICD-10-CM | POA: Diagnosis not present

## 2017-07-11 DIAGNOSIS — M4807 Spinal stenosis, lumbosacral region: Secondary | ICD-10-CM

## 2017-07-11 DIAGNOSIS — I69391 Dysphagia following cerebral infarction: Secondary | ICD-10-CM | POA: Diagnosis not present

## 2017-07-11 DIAGNOSIS — F445 Conversion disorder with seizures or convulsions: Secondary | ICD-10-CM

## 2017-07-11 DIAGNOSIS — I69354 Hemiplegia and hemiparesis following cerebral infarction affecting left non-dominant side: Secondary | ICD-10-CM | POA: Diagnosis not present

## 2017-07-11 DIAGNOSIS — F200 Paranoid schizophrenia: Secondary | ICD-10-CM | POA: Diagnosis not present

## 2017-07-11 DIAGNOSIS — G8194 Hemiplegia, unspecified affecting left nondominant side: Secondary | ICD-10-CM | POA: Diagnosis not present

## 2017-07-11 DIAGNOSIS — F39 Unspecified mood [affective] disorder: Secondary | ICD-10-CM | POA: Diagnosis not present

## 2017-07-12 DIAGNOSIS — I69354 Hemiplegia and hemiparesis following cerebral infarction affecting left non-dominant side: Secondary | ICD-10-CM | POA: Diagnosis not present

## 2017-07-12 DIAGNOSIS — F209 Schizophrenia, unspecified: Secondary | ICD-10-CM | POA: Diagnosis not present

## 2017-07-12 DIAGNOSIS — R1310 Dysphagia, unspecified: Secondary | ICD-10-CM | POA: Diagnosis not present

## 2017-07-12 DIAGNOSIS — I69391 Dysphagia following cerebral infarction: Secondary | ICD-10-CM | POA: Diagnosis not present

## 2017-07-12 DIAGNOSIS — F329 Major depressive disorder, single episode, unspecified: Secondary | ICD-10-CM | POA: Diagnosis not present

## 2017-07-12 DIAGNOSIS — F419 Anxiety disorder, unspecified: Secondary | ICD-10-CM | POA: Diagnosis not present

## 2017-07-13 DIAGNOSIS — I69391 Dysphagia following cerebral infarction: Secondary | ICD-10-CM | POA: Diagnosis not present

## 2017-07-13 DIAGNOSIS — R1310 Dysphagia, unspecified: Secondary | ICD-10-CM | POA: Diagnosis not present

## 2017-07-13 DIAGNOSIS — F419 Anxiety disorder, unspecified: Secondary | ICD-10-CM | POA: Diagnosis not present

## 2017-07-13 DIAGNOSIS — F209 Schizophrenia, unspecified: Secondary | ICD-10-CM | POA: Diagnosis not present

## 2017-07-13 DIAGNOSIS — F329 Major depressive disorder, single episode, unspecified: Secondary | ICD-10-CM | POA: Diagnosis not present

## 2017-07-13 DIAGNOSIS — I69354 Hemiplegia and hemiparesis following cerebral infarction affecting left non-dominant side: Secondary | ICD-10-CM | POA: Diagnosis not present

## 2017-07-15 DIAGNOSIS — F419 Anxiety disorder, unspecified: Secondary | ICD-10-CM | POA: Diagnosis not present

## 2017-07-15 DIAGNOSIS — I69354 Hemiplegia and hemiparesis following cerebral infarction affecting left non-dominant side: Secondary | ICD-10-CM | POA: Diagnosis not present

## 2017-07-15 DIAGNOSIS — I69391 Dysphagia following cerebral infarction: Secondary | ICD-10-CM | POA: Diagnosis not present

## 2017-07-15 DIAGNOSIS — F209 Schizophrenia, unspecified: Secondary | ICD-10-CM | POA: Diagnosis not present

## 2017-07-15 DIAGNOSIS — F329 Major depressive disorder, single episode, unspecified: Secondary | ICD-10-CM | POA: Diagnosis not present

## 2017-07-15 DIAGNOSIS — R1310 Dysphagia, unspecified: Secondary | ICD-10-CM | POA: Diagnosis not present

## 2017-07-16 DIAGNOSIS — F209 Schizophrenia, unspecified: Secondary | ICD-10-CM | POA: Diagnosis not present

## 2017-07-16 DIAGNOSIS — I69391 Dysphagia following cerebral infarction: Secondary | ICD-10-CM | POA: Diagnosis not present

## 2017-07-16 DIAGNOSIS — I69354 Hemiplegia and hemiparesis following cerebral infarction affecting left non-dominant side: Secondary | ICD-10-CM | POA: Diagnosis not present

## 2017-07-16 DIAGNOSIS — F329 Major depressive disorder, single episode, unspecified: Secondary | ICD-10-CM | POA: Diagnosis not present

## 2017-07-16 DIAGNOSIS — F419 Anxiety disorder, unspecified: Secondary | ICD-10-CM | POA: Diagnosis not present

## 2017-07-16 DIAGNOSIS — R1310 Dysphagia, unspecified: Secondary | ICD-10-CM | POA: Diagnosis not present

## 2017-07-17 DIAGNOSIS — F329 Major depressive disorder, single episode, unspecified: Secondary | ICD-10-CM | POA: Diagnosis not present

## 2017-07-17 DIAGNOSIS — R1310 Dysphagia, unspecified: Secondary | ICD-10-CM | POA: Diagnosis not present

## 2017-07-17 DIAGNOSIS — I69354 Hemiplegia and hemiparesis following cerebral infarction affecting left non-dominant side: Secondary | ICD-10-CM | POA: Diagnosis not present

## 2017-07-17 DIAGNOSIS — I69391 Dysphagia following cerebral infarction: Secondary | ICD-10-CM | POA: Diagnosis not present

## 2017-07-17 DIAGNOSIS — F419 Anxiety disorder, unspecified: Secondary | ICD-10-CM | POA: Diagnosis not present

## 2017-07-17 DIAGNOSIS — F209 Schizophrenia, unspecified: Secondary | ICD-10-CM | POA: Diagnosis not present

## 2017-07-18 DIAGNOSIS — F209 Schizophrenia, unspecified: Secondary | ICD-10-CM | POA: Diagnosis not present

## 2017-07-18 DIAGNOSIS — I69354 Hemiplegia and hemiparesis following cerebral infarction affecting left non-dominant side: Secondary | ICD-10-CM | POA: Diagnosis not present

## 2017-07-18 DIAGNOSIS — F329 Major depressive disorder, single episode, unspecified: Secondary | ICD-10-CM | POA: Diagnosis not present

## 2017-07-18 DIAGNOSIS — F419 Anxiety disorder, unspecified: Secondary | ICD-10-CM | POA: Diagnosis not present

## 2017-07-18 DIAGNOSIS — I69391 Dysphagia following cerebral infarction: Secondary | ICD-10-CM | POA: Diagnosis not present

## 2017-07-18 DIAGNOSIS — R1310 Dysphagia, unspecified: Secondary | ICD-10-CM | POA: Diagnosis not present

## 2017-07-19 DIAGNOSIS — F419 Anxiety disorder, unspecified: Secondary | ICD-10-CM | POA: Diagnosis not present

## 2017-07-19 DIAGNOSIS — F209 Schizophrenia, unspecified: Secondary | ICD-10-CM | POA: Diagnosis not present

## 2017-07-19 DIAGNOSIS — R1310 Dysphagia, unspecified: Secondary | ICD-10-CM | POA: Diagnosis not present

## 2017-07-19 DIAGNOSIS — I69391 Dysphagia following cerebral infarction: Secondary | ICD-10-CM | POA: Diagnosis not present

## 2017-07-19 DIAGNOSIS — F329 Major depressive disorder, single episode, unspecified: Secondary | ICD-10-CM | POA: Diagnosis not present

## 2017-07-19 DIAGNOSIS — I69354 Hemiplegia and hemiparesis following cerebral infarction affecting left non-dominant side: Secondary | ICD-10-CM | POA: Diagnosis not present

## 2017-07-22 DIAGNOSIS — I69391 Dysphagia following cerebral infarction: Secondary | ICD-10-CM | POA: Diagnosis not present

## 2017-07-22 DIAGNOSIS — I69354 Hemiplegia and hemiparesis following cerebral infarction affecting left non-dominant side: Secondary | ICD-10-CM | POA: Diagnosis not present

## 2017-07-22 DIAGNOSIS — R1310 Dysphagia, unspecified: Secondary | ICD-10-CM | POA: Diagnosis not present

## 2017-07-22 DIAGNOSIS — F329 Major depressive disorder, single episode, unspecified: Secondary | ICD-10-CM | POA: Diagnosis not present

## 2017-07-22 DIAGNOSIS — F419 Anxiety disorder, unspecified: Secondary | ICD-10-CM | POA: Diagnosis not present

## 2017-07-22 DIAGNOSIS — F209 Schizophrenia, unspecified: Secondary | ICD-10-CM | POA: Diagnosis not present

## 2017-07-23 DIAGNOSIS — I69391 Dysphagia following cerebral infarction: Secondary | ICD-10-CM | POA: Diagnosis not present

## 2017-07-23 DIAGNOSIS — I69354 Hemiplegia and hemiparesis following cerebral infarction affecting left non-dominant side: Secondary | ICD-10-CM | POA: Diagnosis not present

## 2017-07-23 DIAGNOSIS — F209 Schizophrenia, unspecified: Secondary | ICD-10-CM | POA: Diagnosis not present

## 2017-07-23 DIAGNOSIS — F419 Anxiety disorder, unspecified: Secondary | ICD-10-CM | POA: Diagnosis not present

## 2017-07-23 DIAGNOSIS — R1310 Dysphagia, unspecified: Secondary | ICD-10-CM | POA: Diagnosis not present

## 2017-07-23 DIAGNOSIS — F329 Major depressive disorder, single episode, unspecified: Secondary | ICD-10-CM | POA: Diagnosis not present

## 2017-07-24 DIAGNOSIS — F209 Schizophrenia, unspecified: Secondary | ICD-10-CM | POA: Diagnosis not present

## 2017-07-24 DIAGNOSIS — R1310 Dysphagia, unspecified: Secondary | ICD-10-CM | POA: Diagnosis not present

## 2017-07-24 DIAGNOSIS — F329 Major depressive disorder, single episode, unspecified: Secondary | ICD-10-CM | POA: Diagnosis not present

## 2017-07-24 DIAGNOSIS — I69391 Dysphagia following cerebral infarction: Secondary | ICD-10-CM | POA: Diagnosis not present

## 2017-07-24 DIAGNOSIS — I69354 Hemiplegia and hemiparesis following cerebral infarction affecting left non-dominant side: Secondary | ICD-10-CM | POA: Diagnosis not present

## 2017-07-24 DIAGNOSIS — F419 Anxiety disorder, unspecified: Secondary | ICD-10-CM | POA: Diagnosis not present

## 2017-07-25 DIAGNOSIS — F209 Schizophrenia, unspecified: Secondary | ICD-10-CM | POA: Diagnosis not present

## 2017-07-25 DIAGNOSIS — F329 Major depressive disorder, single episode, unspecified: Secondary | ICD-10-CM | POA: Diagnosis not present

## 2017-07-25 DIAGNOSIS — F419 Anxiety disorder, unspecified: Secondary | ICD-10-CM | POA: Diagnosis not present

## 2017-07-25 DIAGNOSIS — I69391 Dysphagia following cerebral infarction: Secondary | ICD-10-CM | POA: Diagnosis not present

## 2017-07-25 DIAGNOSIS — R1310 Dysphagia, unspecified: Secondary | ICD-10-CM | POA: Diagnosis not present

## 2017-07-25 DIAGNOSIS — I69354 Hemiplegia and hemiparesis following cerebral infarction affecting left non-dominant side: Secondary | ICD-10-CM | POA: Diagnosis not present

## 2017-07-26 DIAGNOSIS — I69354 Hemiplegia and hemiparesis following cerebral infarction affecting left non-dominant side: Secondary | ICD-10-CM | POA: Diagnosis not present

## 2017-07-26 DIAGNOSIS — F329 Major depressive disorder, single episode, unspecified: Secondary | ICD-10-CM | POA: Diagnosis not present

## 2017-07-26 DIAGNOSIS — I69391 Dysphagia following cerebral infarction: Secondary | ICD-10-CM | POA: Diagnosis not present

## 2017-07-26 DIAGNOSIS — F209 Schizophrenia, unspecified: Secondary | ICD-10-CM | POA: Diagnosis not present

## 2017-07-26 DIAGNOSIS — F419 Anxiety disorder, unspecified: Secondary | ICD-10-CM | POA: Diagnosis not present

## 2017-07-26 DIAGNOSIS — R1310 Dysphagia, unspecified: Secondary | ICD-10-CM | POA: Diagnosis not present

## 2017-07-29 DIAGNOSIS — R1310 Dysphagia, unspecified: Secondary | ICD-10-CM | POA: Diagnosis not present

## 2017-07-29 DIAGNOSIS — I69391 Dysphagia following cerebral infarction: Secondary | ICD-10-CM | POA: Diagnosis not present

## 2017-07-29 DIAGNOSIS — I69354 Hemiplegia and hemiparesis following cerebral infarction affecting left non-dominant side: Secondary | ICD-10-CM | POA: Diagnosis not present

## 2017-07-29 DIAGNOSIS — F329 Major depressive disorder, single episode, unspecified: Secondary | ICD-10-CM | POA: Diagnosis not present

## 2017-07-29 DIAGNOSIS — F419 Anxiety disorder, unspecified: Secondary | ICD-10-CM | POA: Diagnosis not present

## 2017-07-29 DIAGNOSIS — F209 Schizophrenia, unspecified: Secondary | ICD-10-CM | POA: Diagnosis not present

## 2017-07-30 DIAGNOSIS — I69354 Hemiplegia and hemiparesis following cerebral infarction affecting left non-dominant side: Secondary | ICD-10-CM | POA: Diagnosis not present

## 2017-07-30 DIAGNOSIS — I69391 Dysphagia following cerebral infarction: Secondary | ICD-10-CM | POA: Diagnosis not present

## 2017-07-30 DIAGNOSIS — F419 Anxiety disorder, unspecified: Secondary | ICD-10-CM | POA: Diagnosis not present

## 2017-07-30 DIAGNOSIS — F329 Major depressive disorder, single episode, unspecified: Secondary | ICD-10-CM | POA: Diagnosis not present

## 2017-07-30 DIAGNOSIS — R1310 Dysphagia, unspecified: Secondary | ICD-10-CM | POA: Diagnosis not present

## 2017-07-30 DIAGNOSIS — F209 Schizophrenia, unspecified: Secondary | ICD-10-CM | POA: Diagnosis not present

## 2017-07-31 DIAGNOSIS — F329 Major depressive disorder, single episode, unspecified: Secondary | ICD-10-CM | POA: Diagnosis not present

## 2017-07-31 DIAGNOSIS — R1310 Dysphagia, unspecified: Secondary | ICD-10-CM | POA: Diagnosis not present

## 2017-07-31 DIAGNOSIS — I69354 Hemiplegia and hemiparesis following cerebral infarction affecting left non-dominant side: Secondary | ICD-10-CM | POA: Diagnosis not present

## 2017-07-31 DIAGNOSIS — F419 Anxiety disorder, unspecified: Secondary | ICD-10-CM | POA: Diagnosis not present

## 2017-07-31 DIAGNOSIS — I69391 Dysphagia following cerebral infarction: Secondary | ICD-10-CM | POA: Diagnosis not present

## 2017-07-31 DIAGNOSIS — F209 Schizophrenia, unspecified: Secondary | ICD-10-CM | POA: Diagnosis not present

## 2017-08-01 DIAGNOSIS — I69354 Hemiplegia and hemiparesis following cerebral infarction affecting left non-dominant side: Secondary | ICD-10-CM | POA: Diagnosis not present

## 2017-08-01 DIAGNOSIS — I69391 Dysphagia following cerebral infarction: Secondary | ICD-10-CM | POA: Diagnosis not present

## 2017-08-01 DIAGNOSIS — F419 Anxiety disorder, unspecified: Secondary | ICD-10-CM | POA: Diagnosis not present

## 2017-08-01 DIAGNOSIS — F209 Schizophrenia, unspecified: Secondary | ICD-10-CM | POA: Diagnosis not present

## 2017-08-01 DIAGNOSIS — F329 Major depressive disorder, single episode, unspecified: Secondary | ICD-10-CM | POA: Diagnosis not present

## 2017-08-01 DIAGNOSIS — R1310 Dysphagia, unspecified: Secondary | ICD-10-CM | POA: Diagnosis not present

## 2017-08-02 DIAGNOSIS — F329 Major depressive disorder, single episode, unspecified: Secondary | ICD-10-CM | POA: Diagnosis not present

## 2017-08-02 DIAGNOSIS — F209 Schizophrenia, unspecified: Secondary | ICD-10-CM | POA: Diagnosis not present

## 2017-08-02 DIAGNOSIS — Z431 Encounter for attention to gastrostomy: Secondary | ICD-10-CM | POA: Diagnosis not present

## 2017-08-02 DIAGNOSIS — I69391 Dysphagia following cerebral infarction: Secondary | ICD-10-CM | POA: Diagnosis not present

## 2017-08-02 DIAGNOSIS — L89620 Pressure ulcer of left heel, unstageable: Secondary | ICD-10-CM | POA: Diagnosis not present

## 2017-08-02 DIAGNOSIS — G8929 Other chronic pain: Secondary | ICD-10-CM | POA: Diagnosis not present

## 2017-08-02 DIAGNOSIS — I69320 Aphasia following cerebral infarction: Secondary | ICD-10-CM | POA: Diagnosis not present

## 2017-08-02 DIAGNOSIS — K219 Gastro-esophageal reflux disease without esophagitis: Secondary | ICD-10-CM | POA: Diagnosis not present

## 2017-08-02 DIAGNOSIS — J449 Chronic obstructive pulmonary disease, unspecified: Secondary | ICD-10-CM | POA: Diagnosis not present

## 2017-08-02 DIAGNOSIS — I69354 Hemiplegia and hemiparesis following cerebral infarction affecting left non-dominant side: Secondary | ICD-10-CM | POA: Diagnosis not present

## 2017-08-02 DIAGNOSIS — R1310 Dysphagia, unspecified: Secondary | ICD-10-CM | POA: Diagnosis not present

## 2017-08-02 DIAGNOSIS — M810 Age-related osteoporosis without current pathological fracture: Secondary | ICD-10-CM | POA: Diagnosis not present

## 2017-08-02 DIAGNOSIS — F419 Anxiety disorder, unspecified: Secondary | ICD-10-CM | POA: Diagnosis not present

## 2017-08-02 DIAGNOSIS — Z9981 Dependence on supplemental oxygen: Secondary | ICD-10-CM | POA: Diagnosis not present

## 2017-08-05 DIAGNOSIS — R1310 Dysphagia, unspecified: Secondary | ICD-10-CM | POA: Diagnosis not present

## 2017-08-05 DIAGNOSIS — I69391 Dysphagia following cerebral infarction: Secondary | ICD-10-CM | POA: Diagnosis not present

## 2017-08-05 DIAGNOSIS — I69354 Hemiplegia and hemiparesis following cerebral infarction affecting left non-dominant side: Secondary | ICD-10-CM | POA: Diagnosis not present

## 2017-08-05 DIAGNOSIS — F329 Major depressive disorder, single episode, unspecified: Secondary | ICD-10-CM | POA: Diagnosis not present

## 2017-08-05 DIAGNOSIS — F419 Anxiety disorder, unspecified: Secondary | ICD-10-CM | POA: Diagnosis not present

## 2017-08-05 DIAGNOSIS — F209 Schizophrenia, unspecified: Secondary | ICD-10-CM | POA: Diagnosis not present

## 2017-08-06 DIAGNOSIS — R1310 Dysphagia, unspecified: Secondary | ICD-10-CM | POA: Diagnosis not present

## 2017-08-06 DIAGNOSIS — F209 Schizophrenia, unspecified: Secondary | ICD-10-CM | POA: Diagnosis not present

## 2017-08-06 DIAGNOSIS — I69354 Hemiplegia and hemiparesis following cerebral infarction affecting left non-dominant side: Secondary | ICD-10-CM | POA: Diagnosis not present

## 2017-08-06 DIAGNOSIS — I69391 Dysphagia following cerebral infarction: Secondary | ICD-10-CM | POA: Diagnosis not present

## 2017-08-06 DIAGNOSIS — F329 Major depressive disorder, single episode, unspecified: Secondary | ICD-10-CM | POA: Diagnosis not present

## 2017-08-06 DIAGNOSIS — F419 Anxiety disorder, unspecified: Secondary | ICD-10-CM | POA: Diagnosis not present

## 2017-08-07 DIAGNOSIS — F329 Major depressive disorder, single episode, unspecified: Secondary | ICD-10-CM | POA: Diagnosis not present

## 2017-08-07 DIAGNOSIS — R1310 Dysphagia, unspecified: Secondary | ICD-10-CM | POA: Diagnosis not present

## 2017-08-07 DIAGNOSIS — F209 Schizophrenia, unspecified: Secondary | ICD-10-CM | POA: Diagnosis not present

## 2017-08-07 DIAGNOSIS — I69391 Dysphagia following cerebral infarction: Secondary | ICD-10-CM | POA: Diagnosis not present

## 2017-08-07 DIAGNOSIS — I69354 Hemiplegia and hemiparesis following cerebral infarction affecting left non-dominant side: Secondary | ICD-10-CM | POA: Diagnosis not present

## 2017-08-07 DIAGNOSIS — F419 Anxiety disorder, unspecified: Secondary | ICD-10-CM | POA: Diagnosis not present

## 2017-08-08 DIAGNOSIS — F209 Schizophrenia, unspecified: Secondary | ICD-10-CM | POA: Diagnosis not present

## 2017-08-08 DIAGNOSIS — I69354 Hemiplegia and hemiparesis following cerebral infarction affecting left non-dominant side: Secondary | ICD-10-CM | POA: Diagnosis not present

## 2017-08-08 DIAGNOSIS — F329 Major depressive disorder, single episode, unspecified: Secondary | ICD-10-CM | POA: Diagnosis not present

## 2017-08-08 DIAGNOSIS — F419 Anxiety disorder, unspecified: Secondary | ICD-10-CM | POA: Diagnosis not present

## 2017-08-08 DIAGNOSIS — I69391 Dysphagia following cerebral infarction: Secondary | ICD-10-CM | POA: Diagnosis not present

## 2017-08-08 DIAGNOSIS — R1310 Dysphagia, unspecified: Secondary | ICD-10-CM | POA: Diagnosis not present

## 2017-08-09 DIAGNOSIS — I69391 Dysphagia following cerebral infarction: Secondary | ICD-10-CM | POA: Diagnosis not present

## 2017-08-09 DIAGNOSIS — F419 Anxiety disorder, unspecified: Secondary | ICD-10-CM | POA: Diagnosis not present

## 2017-08-09 DIAGNOSIS — F329 Major depressive disorder, single episode, unspecified: Secondary | ICD-10-CM | POA: Diagnosis not present

## 2017-08-09 DIAGNOSIS — I69354 Hemiplegia and hemiparesis following cerebral infarction affecting left non-dominant side: Secondary | ICD-10-CM | POA: Diagnosis not present

## 2017-08-09 DIAGNOSIS — R1310 Dysphagia, unspecified: Secondary | ICD-10-CM | POA: Diagnosis not present

## 2017-08-09 DIAGNOSIS — F209 Schizophrenia, unspecified: Secondary | ICD-10-CM | POA: Diagnosis not present

## 2017-08-12 DIAGNOSIS — I69391 Dysphagia following cerebral infarction: Secondary | ICD-10-CM | POA: Diagnosis not present

## 2017-08-12 DIAGNOSIS — F209 Schizophrenia, unspecified: Secondary | ICD-10-CM | POA: Diagnosis not present

## 2017-08-12 DIAGNOSIS — R1310 Dysphagia, unspecified: Secondary | ICD-10-CM | POA: Diagnosis not present

## 2017-08-12 DIAGNOSIS — F329 Major depressive disorder, single episode, unspecified: Secondary | ICD-10-CM | POA: Diagnosis not present

## 2017-08-12 DIAGNOSIS — F419 Anxiety disorder, unspecified: Secondary | ICD-10-CM | POA: Diagnosis not present

## 2017-08-12 DIAGNOSIS — I69354 Hemiplegia and hemiparesis following cerebral infarction affecting left non-dominant side: Secondary | ICD-10-CM | POA: Diagnosis not present

## 2017-08-13 DIAGNOSIS — F329 Major depressive disorder, single episode, unspecified: Secondary | ICD-10-CM | POA: Diagnosis not present

## 2017-08-13 DIAGNOSIS — F419 Anxiety disorder, unspecified: Secondary | ICD-10-CM | POA: Diagnosis not present

## 2017-08-13 DIAGNOSIS — F209 Schizophrenia, unspecified: Secondary | ICD-10-CM | POA: Diagnosis not present

## 2017-08-13 DIAGNOSIS — I69354 Hemiplegia and hemiparesis following cerebral infarction affecting left non-dominant side: Secondary | ICD-10-CM | POA: Diagnosis not present

## 2017-08-13 DIAGNOSIS — I69391 Dysphagia following cerebral infarction: Secondary | ICD-10-CM | POA: Diagnosis not present

## 2017-08-13 DIAGNOSIS — R1310 Dysphagia, unspecified: Secondary | ICD-10-CM | POA: Diagnosis not present

## 2017-08-14 DIAGNOSIS — I69354 Hemiplegia and hemiparesis following cerebral infarction affecting left non-dominant side: Secondary | ICD-10-CM | POA: Diagnosis not present

## 2017-08-14 DIAGNOSIS — F329 Major depressive disorder, single episode, unspecified: Secondary | ICD-10-CM | POA: Diagnosis not present

## 2017-08-14 DIAGNOSIS — I69391 Dysphagia following cerebral infarction: Secondary | ICD-10-CM | POA: Diagnosis not present

## 2017-08-14 DIAGNOSIS — F209 Schizophrenia, unspecified: Secondary | ICD-10-CM | POA: Diagnosis not present

## 2017-08-14 DIAGNOSIS — F419 Anxiety disorder, unspecified: Secondary | ICD-10-CM | POA: Diagnosis not present

## 2017-08-14 DIAGNOSIS — R1310 Dysphagia, unspecified: Secondary | ICD-10-CM | POA: Diagnosis not present

## 2017-08-15 DIAGNOSIS — F419 Anxiety disorder, unspecified: Secondary | ICD-10-CM | POA: Diagnosis not present

## 2017-08-15 DIAGNOSIS — I69391 Dysphagia following cerebral infarction: Secondary | ICD-10-CM | POA: Diagnosis not present

## 2017-08-15 DIAGNOSIS — F209 Schizophrenia, unspecified: Secondary | ICD-10-CM | POA: Diagnosis not present

## 2017-08-15 DIAGNOSIS — I69354 Hemiplegia and hemiparesis following cerebral infarction affecting left non-dominant side: Secondary | ICD-10-CM | POA: Diagnosis not present

## 2017-08-15 DIAGNOSIS — R1310 Dysphagia, unspecified: Secondary | ICD-10-CM | POA: Diagnosis not present

## 2017-08-15 DIAGNOSIS — F329 Major depressive disorder, single episode, unspecified: Secondary | ICD-10-CM | POA: Diagnosis not present

## 2017-08-16 DIAGNOSIS — F209 Schizophrenia, unspecified: Secondary | ICD-10-CM | POA: Diagnosis not present

## 2017-08-16 DIAGNOSIS — I69391 Dysphagia following cerebral infarction: Secondary | ICD-10-CM | POA: Diagnosis not present

## 2017-08-16 DIAGNOSIS — I69354 Hemiplegia and hemiparesis following cerebral infarction affecting left non-dominant side: Secondary | ICD-10-CM | POA: Diagnosis not present

## 2017-08-16 DIAGNOSIS — F329 Major depressive disorder, single episode, unspecified: Secondary | ICD-10-CM | POA: Diagnosis not present

## 2017-08-16 DIAGNOSIS — F419 Anxiety disorder, unspecified: Secondary | ICD-10-CM | POA: Diagnosis not present

## 2017-08-16 DIAGNOSIS — R1310 Dysphagia, unspecified: Secondary | ICD-10-CM | POA: Diagnosis not present

## 2017-08-19 DIAGNOSIS — R1310 Dysphagia, unspecified: Secondary | ICD-10-CM | POA: Diagnosis not present

## 2017-08-19 DIAGNOSIS — F209 Schizophrenia, unspecified: Secondary | ICD-10-CM | POA: Diagnosis not present

## 2017-08-19 DIAGNOSIS — I69354 Hemiplegia and hemiparesis following cerebral infarction affecting left non-dominant side: Secondary | ICD-10-CM | POA: Diagnosis not present

## 2017-08-19 DIAGNOSIS — F329 Major depressive disorder, single episode, unspecified: Secondary | ICD-10-CM | POA: Diagnosis not present

## 2017-08-19 DIAGNOSIS — F419 Anxiety disorder, unspecified: Secondary | ICD-10-CM | POA: Diagnosis not present

## 2017-08-19 DIAGNOSIS — I69391 Dysphagia following cerebral infarction: Secondary | ICD-10-CM | POA: Diagnosis not present

## 2017-08-20 DIAGNOSIS — I69354 Hemiplegia and hemiparesis following cerebral infarction affecting left non-dominant side: Secondary | ICD-10-CM | POA: Diagnosis not present

## 2017-08-20 DIAGNOSIS — F329 Major depressive disorder, single episode, unspecified: Secondary | ICD-10-CM | POA: Diagnosis not present

## 2017-08-20 DIAGNOSIS — F209 Schizophrenia, unspecified: Secondary | ICD-10-CM | POA: Diagnosis not present

## 2017-08-20 DIAGNOSIS — I69391 Dysphagia following cerebral infarction: Secondary | ICD-10-CM | POA: Diagnosis not present

## 2017-08-20 DIAGNOSIS — F419 Anxiety disorder, unspecified: Secondary | ICD-10-CM | POA: Diagnosis not present

## 2017-08-20 DIAGNOSIS — R1310 Dysphagia, unspecified: Secondary | ICD-10-CM | POA: Diagnosis not present

## 2017-08-21 DIAGNOSIS — F329 Major depressive disorder, single episode, unspecified: Secondary | ICD-10-CM | POA: Diagnosis not present

## 2017-08-21 DIAGNOSIS — F419 Anxiety disorder, unspecified: Secondary | ICD-10-CM | POA: Diagnosis not present

## 2017-08-21 DIAGNOSIS — I69391 Dysphagia following cerebral infarction: Secondary | ICD-10-CM | POA: Diagnosis not present

## 2017-08-21 DIAGNOSIS — F209 Schizophrenia, unspecified: Secondary | ICD-10-CM | POA: Diagnosis not present

## 2017-08-21 DIAGNOSIS — I69354 Hemiplegia and hemiparesis following cerebral infarction affecting left non-dominant side: Secondary | ICD-10-CM | POA: Diagnosis not present

## 2017-08-21 DIAGNOSIS — R1310 Dysphagia, unspecified: Secondary | ICD-10-CM | POA: Diagnosis not present

## 2017-08-22 DIAGNOSIS — R1310 Dysphagia, unspecified: Secondary | ICD-10-CM | POA: Diagnosis not present

## 2017-08-22 DIAGNOSIS — F329 Major depressive disorder, single episode, unspecified: Secondary | ICD-10-CM | POA: Diagnosis not present

## 2017-08-22 DIAGNOSIS — F419 Anxiety disorder, unspecified: Secondary | ICD-10-CM | POA: Diagnosis not present

## 2017-08-22 DIAGNOSIS — I69391 Dysphagia following cerebral infarction: Secondary | ICD-10-CM | POA: Diagnosis not present

## 2017-08-22 DIAGNOSIS — I69354 Hemiplegia and hemiparesis following cerebral infarction affecting left non-dominant side: Secondary | ICD-10-CM | POA: Diagnosis not present

## 2017-08-22 DIAGNOSIS — F209 Schizophrenia, unspecified: Secondary | ICD-10-CM | POA: Diagnosis not present

## 2017-08-23 DIAGNOSIS — R1310 Dysphagia, unspecified: Secondary | ICD-10-CM | POA: Diagnosis not present

## 2017-08-23 DIAGNOSIS — F329 Major depressive disorder, single episode, unspecified: Secondary | ICD-10-CM | POA: Diagnosis not present

## 2017-08-23 DIAGNOSIS — I69354 Hemiplegia and hemiparesis following cerebral infarction affecting left non-dominant side: Secondary | ICD-10-CM | POA: Diagnosis not present

## 2017-08-23 DIAGNOSIS — F209 Schizophrenia, unspecified: Secondary | ICD-10-CM | POA: Diagnosis not present

## 2017-08-23 DIAGNOSIS — I69391 Dysphagia following cerebral infarction: Secondary | ICD-10-CM | POA: Diagnosis not present

## 2017-08-23 DIAGNOSIS — F419 Anxiety disorder, unspecified: Secondary | ICD-10-CM | POA: Diagnosis not present

## 2017-08-26 DIAGNOSIS — I69354 Hemiplegia and hemiparesis following cerebral infarction affecting left non-dominant side: Secondary | ICD-10-CM | POA: Diagnosis not present

## 2017-08-26 DIAGNOSIS — F329 Major depressive disorder, single episode, unspecified: Secondary | ICD-10-CM | POA: Diagnosis not present

## 2017-08-26 DIAGNOSIS — I69391 Dysphagia following cerebral infarction: Secondary | ICD-10-CM | POA: Diagnosis not present

## 2017-08-26 DIAGNOSIS — F209 Schizophrenia, unspecified: Secondary | ICD-10-CM | POA: Diagnosis not present

## 2017-08-26 DIAGNOSIS — F419 Anxiety disorder, unspecified: Secondary | ICD-10-CM | POA: Diagnosis not present

## 2017-08-26 DIAGNOSIS — R1310 Dysphagia, unspecified: Secondary | ICD-10-CM | POA: Diagnosis not present

## 2017-08-27 DIAGNOSIS — F329 Major depressive disorder, single episode, unspecified: Secondary | ICD-10-CM | POA: Diagnosis not present

## 2017-08-27 DIAGNOSIS — F209 Schizophrenia, unspecified: Secondary | ICD-10-CM | POA: Diagnosis not present

## 2017-08-27 DIAGNOSIS — R1310 Dysphagia, unspecified: Secondary | ICD-10-CM | POA: Diagnosis not present

## 2017-08-27 DIAGNOSIS — F419 Anxiety disorder, unspecified: Secondary | ICD-10-CM | POA: Diagnosis not present

## 2017-08-27 DIAGNOSIS — I69354 Hemiplegia and hemiparesis following cerebral infarction affecting left non-dominant side: Secondary | ICD-10-CM | POA: Diagnosis not present

## 2017-08-27 DIAGNOSIS — I69391 Dysphagia following cerebral infarction: Secondary | ICD-10-CM | POA: Diagnosis not present

## 2017-08-28 DIAGNOSIS — F329 Major depressive disorder, single episode, unspecified: Secondary | ICD-10-CM | POA: Diagnosis not present

## 2017-08-28 DIAGNOSIS — I69391 Dysphagia following cerebral infarction: Secondary | ICD-10-CM | POA: Diagnosis not present

## 2017-08-28 DIAGNOSIS — F209 Schizophrenia, unspecified: Secondary | ICD-10-CM | POA: Diagnosis not present

## 2017-08-28 DIAGNOSIS — I69354 Hemiplegia and hemiparesis following cerebral infarction affecting left non-dominant side: Secondary | ICD-10-CM | POA: Diagnosis not present

## 2017-08-28 DIAGNOSIS — R1310 Dysphagia, unspecified: Secondary | ICD-10-CM | POA: Diagnosis not present

## 2017-08-28 DIAGNOSIS — F419 Anxiety disorder, unspecified: Secondary | ICD-10-CM | POA: Diagnosis not present

## 2017-08-29 DIAGNOSIS — R1310 Dysphagia, unspecified: Secondary | ICD-10-CM | POA: Diagnosis not present

## 2017-08-29 DIAGNOSIS — F209 Schizophrenia, unspecified: Secondary | ICD-10-CM | POA: Diagnosis not present

## 2017-08-29 DIAGNOSIS — F329 Major depressive disorder, single episode, unspecified: Secondary | ICD-10-CM | POA: Diagnosis not present

## 2017-08-29 DIAGNOSIS — I69391 Dysphagia following cerebral infarction: Secondary | ICD-10-CM | POA: Diagnosis not present

## 2017-08-29 DIAGNOSIS — I69354 Hemiplegia and hemiparesis following cerebral infarction affecting left non-dominant side: Secondary | ICD-10-CM | POA: Diagnosis not present

## 2017-08-29 DIAGNOSIS — F419 Anxiety disorder, unspecified: Secondary | ICD-10-CM | POA: Diagnosis not present

## 2017-08-30 DIAGNOSIS — F329 Major depressive disorder, single episode, unspecified: Secondary | ICD-10-CM | POA: Diagnosis not present

## 2017-08-30 DIAGNOSIS — I69354 Hemiplegia and hemiparesis following cerebral infarction affecting left non-dominant side: Secondary | ICD-10-CM | POA: Diagnosis not present

## 2017-08-30 DIAGNOSIS — F419 Anxiety disorder, unspecified: Secondary | ICD-10-CM | POA: Diagnosis not present

## 2017-08-30 DIAGNOSIS — M810 Age-related osteoporosis without current pathological fracture: Secondary | ICD-10-CM | POA: Diagnosis not present

## 2017-08-30 DIAGNOSIS — R1310 Dysphagia, unspecified: Secondary | ICD-10-CM | POA: Diagnosis not present

## 2017-08-30 DIAGNOSIS — I69391 Dysphagia following cerebral infarction: Secondary | ICD-10-CM | POA: Diagnosis not present

## 2017-08-30 DIAGNOSIS — Z431 Encounter for attention to gastrostomy: Secondary | ICD-10-CM | POA: Diagnosis not present

## 2017-08-30 DIAGNOSIS — F209 Schizophrenia, unspecified: Secondary | ICD-10-CM | POA: Diagnosis not present

## 2017-08-30 DIAGNOSIS — M24542 Contracture, left hand: Secondary | ICD-10-CM | POA: Diagnosis not present

## 2017-08-30 DIAGNOSIS — G8929 Other chronic pain: Secondary | ICD-10-CM | POA: Diagnosis not present

## 2017-08-30 DIAGNOSIS — K219 Gastro-esophageal reflux disease without esophagitis: Secondary | ICD-10-CM | POA: Diagnosis not present

## 2017-08-30 DIAGNOSIS — L89620 Pressure ulcer of left heel, unstageable: Secondary | ICD-10-CM | POA: Diagnosis not present

## 2017-08-30 DIAGNOSIS — R630 Anorexia: Secondary | ICD-10-CM | POA: Diagnosis not present

## 2017-08-30 DIAGNOSIS — I69320 Aphasia following cerebral infarction: Secondary | ICD-10-CM | POA: Diagnosis not present

## 2017-08-30 DIAGNOSIS — J449 Chronic obstructive pulmonary disease, unspecified: Secondary | ICD-10-CM | POA: Diagnosis not present

## 2017-08-30 DIAGNOSIS — Z9981 Dependence on supplemental oxygen: Secondary | ICD-10-CM | POA: Diagnosis not present

## 2017-09-02 DIAGNOSIS — R1310 Dysphagia, unspecified: Secondary | ICD-10-CM | POA: Diagnosis not present

## 2017-09-02 DIAGNOSIS — F419 Anxiety disorder, unspecified: Secondary | ICD-10-CM | POA: Diagnosis not present

## 2017-09-02 DIAGNOSIS — I69354 Hemiplegia and hemiparesis following cerebral infarction affecting left non-dominant side: Secondary | ICD-10-CM | POA: Diagnosis not present

## 2017-09-02 DIAGNOSIS — F209 Schizophrenia, unspecified: Secondary | ICD-10-CM | POA: Diagnosis not present

## 2017-09-02 DIAGNOSIS — F329 Major depressive disorder, single episode, unspecified: Secondary | ICD-10-CM | POA: Diagnosis not present

## 2017-09-02 DIAGNOSIS — I69391 Dysphagia following cerebral infarction: Secondary | ICD-10-CM | POA: Diagnosis not present

## 2017-09-03 DIAGNOSIS — F209 Schizophrenia, unspecified: Secondary | ICD-10-CM | POA: Diagnosis not present

## 2017-09-03 DIAGNOSIS — I69391 Dysphagia following cerebral infarction: Secondary | ICD-10-CM | POA: Diagnosis not present

## 2017-09-03 DIAGNOSIS — F419 Anxiety disorder, unspecified: Secondary | ICD-10-CM | POA: Diagnosis not present

## 2017-09-03 DIAGNOSIS — F329 Major depressive disorder, single episode, unspecified: Secondary | ICD-10-CM | POA: Diagnosis not present

## 2017-09-03 DIAGNOSIS — R1310 Dysphagia, unspecified: Secondary | ICD-10-CM | POA: Diagnosis not present

## 2017-09-03 DIAGNOSIS — I69354 Hemiplegia and hemiparesis following cerebral infarction affecting left non-dominant side: Secondary | ICD-10-CM | POA: Diagnosis not present

## 2017-09-04 DIAGNOSIS — I69391 Dysphagia following cerebral infarction: Secondary | ICD-10-CM | POA: Diagnosis not present

## 2017-09-04 DIAGNOSIS — I69354 Hemiplegia and hemiparesis following cerebral infarction affecting left non-dominant side: Secondary | ICD-10-CM | POA: Diagnosis not present

## 2017-09-04 DIAGNOSIS — F209 Schizophrenia, unspecified: Secondary | ICD-10-CM | POA: Diagnosis not present

## 2017-09-04 DIAGNOSIS — F419 Anxiety disorder, unspecified: Secondary | ICD-10-CM | POA: Diagnosis not present

## 2017-09-04 DIAGNOSIS — F329 Major depressive disorder, single episode, unspecified: Secondary | ICD-10-CM | POA: Diagnosis not present

## 2017-09-04 DIAGNOSIS — R1310 Dysphagia, unspecified: Secondary | ICD-10-CM | POA: Diagnosis not present

## 2017-09-05 DIAGNOSIS — F329 Major depressive disorder, single episode, unspecified: Secondary | ICD-10-CM | POA: Diagnosis not present

## 2017-09-05 DIAGNOSIS — R1310 Dysphagia, unspecified: Secondary | ICD-10-CM | POA: Diagnosis not present

## 2017-09-05 DIAGNOSIS — I69354 Hemiplegia and hemiparesis following cerebral infarction affecting left non-dominant side: Secondary | ICD-10-CM | POA: Diagnosis not present

## 2017-09-05 DIAGNOSIS — I69391 Dysphagia following cerebral infarction: Secondary | ICD-10-CM | POA: Diagnosis not present

## 2017-09-05 DIAGNOSIS — F419 Anxiety disorder, unspecified: Secondary | ICD-10-CM | POA: Diagnosis not present

## 2017-09-05 DIAGNOSIS — F209 Schizophrenia, unspecified: Secondary | ICD-10-CM | POA: Diagnosis not present

## 2017-09-06 DIAGNOSIS — F419 Anxiety disorder, unspecified: Secondary | ICD-10-CM | POA: Diagnosis not present

## 2017-09-06 DIAGNOSIS — I69391 Dysphagia following cerebral infarction: Secondary | ICD-10-CM | POA: Diagnosis not present

## 2017-09-06 DIAGNOSIS — F209 Schizophrenia, unspecified: Secondary | ICD-10-CM | POA: Diagnosis not present

## 2017-09-06 DIAGNOSIS — I69354 Hemiplegia and hemiparesis following cerebral infarction affecting left non-dominant side: Secondary | ICD-10-CM | POA: Diagnosis not present

## 2017-09-06 DIAGNOSIS — F329 Major depressive disorder, single episode, unspecified: Secondary | ICD-10-CM | POA: Diagnosis not present

## 2017-09-06 DIAGNOSIS — R1310 Dysphagia, unspecified: Secondary | ICD-10-CM | POA: Diagnosis not present

## 2017-09-09 DIAGNOSIS — F419 Anxiety disorder, unspecified: Secondary | ICD-10-CM | POA: Diagnosis not present

## 2017-09-09 DIAGNOSIS — I69354 Hemiplegia and hemiparesis following cerebral infarction affecting left non-dominant side: Secondary | ICD-10-CM | POA: Diagnosis not present

## 2017-09-09 DIAGNOSIS — I69391 Dysphagia following cerebral infarction: Secondary | ICD-10-CM | POA: Diagnosis not present

## 2017-09-09 DIAGNOSIS — F329 Major depressive disorder, single episode, unspecified: Secondary | ICD-10-CM | POA: Diagnosis not present

## 2017-09-09 DIAGNOSIS — R1310 Dysphagia, unspecified: Secondary | ICD-10-CM | POA: Diagnosis not present

## 2017-09-09 DIAGNOSIS — F209 Schizophrenia, unspecified: Secondary | ICD-10-CM | POA: Diagnosis not present

## 2017-09-10 DIAGNOSIS — I69391 Dysphagia following cerebral infarction: Secondary | ICD-10-CM | POA: Diagnosis not present

## 2017-09-10 DIAGNOSIS — F419 Anxiety disorder, unspecified: Secondary | ICD-10-CM | POA: Diagnosis not present

## 2017-09-10 DIAGNOSIS — R1310 Dysphagia, unspecified: Secondary | ICD-10-CM | POA: Diagnosis not present

## 2017-09-10 DIAGNOSIS — F209 Schizophrenia, unspecified: Secondary | ICD-10-CM | POA: Diagnosis not present

## 2017-09-10 DIAGNOSIS — I69354 Hemiplegia and hemiparesis following cerebral infarction affecting left non-dominant side: Secondary | ICD-10-CM | POA: Diagnosis not present

## 2017-09-10 DIAGNOSIS — F329 Major depressive disorder, single episode, unspecified: Secondary | ICD-10-CM | POA: Diagnosis not present

## 2017-09-11 DIAGNOSIS — F419 Anxiety disorder, unspecified: Secondary | ICD-10-CM | POA: Diagnosis not present

## 2017-09-11 DIAGNOSIS — F329 Major depressive disorder, single episode, unspecified: Secondary | ICD-10-CM | POA: Diagnosis not present

## 2017-09-11 DIAGNOSIS — F209 Schizophrenia, unspecified: Secondary | ICD-10-CM | POA: Diagnosis not present

## 2017-09-11 DIAGNOSIS — I69354 Hemiplegia and hemiparesis following cerebral infarction affecting left non-dominant side: Secondary | ICD-10-CM | POA: Diagnosis not present

## 2017-09-11 DIAGNOSIS — I69391 Dysphagia following cerebral infarction: Secondary | ICD-10-CM | POA: Diagnosis not present

## 2017-09-11 DIAGNOSIS — R1310 Dysphagia, unspecified: Secondary | ICD-10-CM | POA: Diagnosis not present

## 2017-09-12 DIAGNOSIS — R1310 Dysphagia, unspecified: Secondary | ICD-10-CM | POA: Diagnosis not present

## 2017-09-12 DIAGNOSIS — I69391 Dysphagia following cerebral infarction: Secondary | ICD-10-CM | POA: Diagnosis not present

## 2017-09-12 DIAGNOSIS — F419 Anxiety disorder, unspecified: Secondary | ICD-10-CM | POA: Diagnosis not present

## 2017-09-12 DIAGNOSIS — F209 Schizophrenia, unspecified: Secondary | ICD-10-CM | POA: Diagnosis not present

## 2017-09-12 DIAGNOSIS — F329 Major depressive disorder, single episode, unspecified: Secondary | ICD-10-CM | POA: Diagnosis not present

## 2017-09-12 DIAGNOSIS — I69354 Hemiplegia and hemiparesis following cerebral infarction affecting left non-dominant side: Secondary | ICD-10-CM | POA: Diagnosis not present

## 2017-09-13 DIAGNOSIS — R1310 Dysphagia, unspecified: Secondary | ICD-10-CM | POA: Diagnosis not present

## 2017-09-13 DIAGNOSIS — I69391 Dysphagia following cerebral infarction: Secondary | ICD-10-CM | POA: Diagnosis not present

## 2017-09-13 DIAGNOSIS — I69354 Hemiplegia and hemiparesis following cerebral infarction affecting left non-dominant side: Secondary | ICD-10-CM | POA: Diagnosis not present

## 2017-09-13 DIAGNOSIS — F209 Schizophrenia, unspecified: Secondary | ICD-10-CM | POA: Diagnosis not present

## 2017-09-13 DIAGNOSIS — F329 Major depressive disorder, single episode, unspecified: Secondary | ICD-10-CM | POA: Diagnosis not present

## 2017-09-13 DIAGNOSIS — F419 Anxiety disorder, unspecified: Secondary | ICD-10-CM | POA: Diagnosis not present

## 2017-09-16 DIAGNOSIS — F209 Schizophrenia, unspecified: Secondary | ICD-10-CM | POA: Diagnosis not present

## 2017-09-16 DIAGNOSIS — R1310 Dysphagia, unspecified: Secondary | ICD-10-CM | POA: Diagnosis not present

## 2017-09-16 DIAGNOSIS — F419 Anxiety disorder, unspecified: Secondary | ICD-10-CM | POA: Diagnosis not present

## 2017-09-16 DIAGNOSIS — F329 Major depressive disorder, single episode, unspecified: Secondary | ICD-10-CM | POA: Diagnosis not present

## 2017-09-16 DIAGNOSIS — I69354 Hemiplegia and hemiparesis following cerebral infarction affecting left non-dominant side: Secondary | ICD-10-CM | POA: Diagnosis not present

## 2017-09-16 DIAGNOSIS — I69391 Dysphagia following cerebral infarction: Secondary | ICD-10-CM | POA: Diagnosis not present

## 2017-09-17 DIAGNOSIS — I69354 Hemiplegia and hemiparesis following cerebral infarction affecting left non-dominant side: Secondary | ICD-10-CM | POA: Diagnosis not present

## 2017-09-17 DIAGNOSIS — R1310 Dysphagia, unspecified: Secondary | ICD-10-CM | POA: Diagnosis not present

## 2017-09-17 DIAGNOSIS — F329 Major depressive disorder, single episode, unspecified: Secondary | ICD-10-CM | POA: Diagnosis not present

## 2017-09-17 DIAGNOSIS — F209 Schizophrenia, unspecified: Secondary | ICD-10-CM | POA: Diagnosis not present

## 2017-09-17 DIAGNOSIS — F419 Anxiety disorder, unspecified: Secondary | ICD-10-CM | POA: Diagnosis not present

## 2017-09-17 DIAGNOSIS — I69391 Dysphagia following cerebral infarction: Secondary | ICD-10-CM | POA: Diagnosis not present

## 2017-09-18 DIAGNOSIS — R1310 Dysphagia, unspecified: Secondary | ICD-10-CM | POA: Diagnosis not present

## 2017-09-18 DIAGNOSIS — F209 Schizophrenia, unspecified: Secondary | ICD-10-CM | POA: Diagnosis not present

## 2017-09-18 DIAGNOSIS — F419 Anxiety disorder, unspecified: Secondary | ICD-10-CM | POA: Diagnosis not present

## 2017-09-18 DIAGNOSIS — I69354 Hemiplegia and hemiparesis following cerebral infarction affecting left non-dominant side: Secondary | ICD-10-CM | POA: Diagnosis not present

## 2017-09-18 DIAGNOSIS — F329 Major depressive disorder, single episode, unspecified: Secondary | ICD-10-CM | POA: Diagnosis not present

## 2017-09-18 DIAGNOSIS — I69391 Dysphagia following cerebral infarction: Secondary | ICD-10-CM | POA: Diagnosis not present

## 2017-09-19 DIAGNOSIS — R1310 Dysphagia, unspecified: Secondary | ICD-10-CM | POA: Diagnosis not present

## 2017-09-19 DIAGNOSIS — F329 Major depressive disorder, single episode, unspecified: Secondary | ICD-10-CM | POA: Diagnosis not present

## 2017-09-19 DIAGNOSIS — I69354 Hemiplegia and hemiparesis following cerebral infarction affecting left non-dominant side: Secondary | ICD-10-CM | POA: Diagnosis not present

## 2017-09-19 DIAGNOSIS — I69391 Dysphagia following cerebral infarction: Secondary | ICD-10-CM | POA: Diagnosis not present

## 2017-09-19 DIAGNOSIS — F209 Schizophrenia, unspecified: Secondary | ICD-10-CM | POA: Diagnosis not present

## 2017-09-19 DIAGNOSIS — F419 Anxiety disorder, unspecified: Secondary | ICD-10-CM | POA: Diagnosis not present

## 2017-09-20 DIAGNOSIS — I69354 Hemiplegia and hemiparesis following cerebral infarction affecting left non-dominant side: Secondary | ICD-10-CM | POA: Diagnosis not present

## 2017-09-20 DIAGNOSIS — I69391 Dysphagia following cerebral infarction: Secondary | ICD-10-CM | POA: Diagnosis not present

## 2017-09-20 DIAGNOSIS — F209 Schizophrenia, unspecified: Secondary | ICD-10-CM | POA: Diagnosis not present

## 2017-09-20 DIAGNOSIS — R1310 Dysphagia, unspecified: Secondary | ICD-10-CM | POA: Diagnosis not present

## 2017-09-20 DIAGNOSIS — F329 Major depressive disorder, single episode, unspecified: Secondary | ICD-10-CM | POA: Diagnosis not present

## 2017-09-20 DIAGNOSIS — F419 Anxiety disorder, unspecified: Secondary | ICD-10-CM | POA: Diagnosis not present

## 2017-09-23 DIAGNOSIS — R1310 Dysphagia, unspecified: Secondary | ICD-10-CM | POA: Diagnosis not present

## 2017-09-23 DIAGNOSIS — F209 Schizophrenia, unspecified: Secondary | ICD-10-CM | POA: Diagnosis not present

## 2017-09-23 DIAGNOSIS — F419 Anxiety disorder, unspecified: Secondary | ICD-10-CM | POA: Diagnosis not present

## 2017-09-23 DIAGNOSIS — I69354 Hemiplegia and hemiparesis following cerebral infarction affecting left non-dominant side: Secondary | ICD-10-CM | POA: Diagnosis not present

## 2017-09-23 DIAGNOSIS — F329 Major depressive disorder, single episode, unspecified: Secondary | ICD-10-CM | POA: Diagnosis not present

## 2017-09-23 DIAGNOSIS — I69391 Dysphagia following cerebral infarction: Secondary | ICD-10-CM | POA: Diagnosis not present

## 2017-09-24 DIAGNOSIS — F419 Anxiety disorder, unspecified: Secondary | ICD-10-CM | POA: Diagnosis not present

## 2017-09-24 DIAGNOSIS — R1310 Dysphagia, unspecified: Secondary | ICD-10-CM | POA: Diagnosis not present

## 2017-09-24 DIAGNOSIS — I69354 Hemiplegia and hemiparesis following cerebral infarction affecting left non-dominant side: Secondary | ICD-10-CM | POA: Diagnosis not present

## 2017-09-24 DIAGNOSIS — I69391 Dysphagia following cerebral infarction: Secondary | ICD-10-CM | POA: Diagnosis not present

## 2017-09-24 DIAGNOSIS — F329 Major depressive disorder, single episode, unspecified: Secondary | ICD-10-CM | POA: Diagnosis not present

## 2017-09-24 DIAGNOSIS — F209 Schizophrenia, unspecified: Secondary | ICD-10-CM | POA: Diagnosis not present

## 2017-09-25 DIAGNOSIS — M4807 Spinal stenosis, lumbosacral region: Secondary | ICD-10-CM | POA: Diagnosis not present

## 2017-09-25 DIAGNOSIS — I69391 Dysphagia following cerebral infarction: Secondary | ICD-10-CM | POA: Diagnosis not present

## 2017-09-25 DIAGNOSIS — F329 Major depressive disorder, single episode, unspecified: Secondary | ICD-10-CM | POA: Diagnosis not present

## 2017-09-25 DIAGNOSIS — R1310 Dysphagia, unspecified: Secondary | ICD-10-CM | POA: Diagnosis not present

## 2017-09-25 DIAGNOSIS — F209 Schizophrenia, unspecified: Secondary | ICD-10-CM | POA: Diagnosis not present

## 2017-09-25 DIAGNOSIS — F419 Anxiety disorder, unspecified: Secondary | ICD-10-CM | POA: Diagnosis not present

## 2017-09-25 DIAGNOSIS — F200 Paranoid schizophrenia: Secondary | ICD-10-CM | POA: Diagnosis not present

## 2017-09-25 DIAGNOSIS — I69354 Hemiplegia and hemiparesis following cerebral infarction affecting left non-dominant side: Secondary | ICD-10-CM | POA: Diagnosis not present

## 2017-09-25 DIAGNOSIS — J449 Chronic obstructive pulmonary disease, unspecified: Secondary | ICD-10-CM | POA: Diagnosis not present

## 2017-09-25 DIAGNOSIS — F39 Unspecified mood [affective] disorder: Secondary | ICD-10-CM | POA: Diagnosis not present

## 2017-09-26 DIAGNOSIS — I69391 Dysphagia following cerebral infarction: Secondary | ICD-10-CM | POA: Diagnosis not present

## 2017-09-26 DIAGNOSIS — F419 Anxiety disorder, unspecified: Secondary | ICD-10-CM | POA: Diagnosis not present

## 2017-09-26 DIAGNOSIS — F209 Schizophrenia, unspecified: Secondary | ICD-10-CM | POA: Diagnosis not present

## 2017-09-26 DIAGNOSIS — R1310 Dysphagia, unspecified: Secondary | ICD-10-CM | POA: Diagnosis not present

## 2017-09-26 DIAGNOSIS — F329 Major depressive disorder, single episode, unspecified: Secondary | ICD-10-CM | POA: Diagnosis not present

## 2017-09-26 DIAGNOSIS — I69354 Hemiplegia and hemiparesis following cerebral infarction affecting left non-dominant side: Secondary | ICD-10-CM | POA: Diagnosis not present

## 2017-09-27 DIAGNOSIS — I69391 Dysphagia following cerebral infarction: Secondary | ICD-10-CM | POA: Diagnosis not present

## 2017-09-27 DIAGNOSIS — F419 Anxiety disorder, unspecified: Secondary | ICD-10-CM | POA: Diagnosis not present

## 2017-09-27 DIAGNOSIS — F209 Schizophrenia, unspecified: Secondary | ICD-10-CM | POA: Diagnosis not present

## 2017-09-27 DIAGNOSIS — R1310 Dysphagia, unspecified: Secondary | ICD-10-CM | POA: Diagnosis not present

## 2017-09-27 DIAGNOSIS — I69354 Hemiplegia and hemiparesis following cerebral infarction affecting left non-dominant side: Secondary | ICD-10-CM | POA: Diagnosis not present

## 2017-09-27 DIAGNOSIS — F329 Major depressive disorder, single episode, unspecified: Secondary | ICD-10-CM | POA: Diagnosis not present

## 2017-09-30 DIAGNOSIS — I69354 Hemiplegia and hemiparesis following cerebral infarction affecting left non-dominant side: Secondary | ICD-10-CM | POA: Diagnosis not present

## 2017-09-30 DIAGNOSIS — G8929 Other chronic pain: Secondary | ICD-10-CM | POA: Diagnosis not present

## 2017-09-30 DIAGNOSIS — M810 Age-related osteoporosis without current pathological fracture: Secondary | ICD-10-CM | POA: Diagnosis not present

## 2017-09-30 DIAGNOSIS — J449 Chronic obstructive pulmonary disease, unspecified: Secondary | ICD-10-CM | POA: Diagnosis not present

## 2017-09-30 DIAGNOSIS — F419 Anxiety disorder, unspecified: Secondary | ICD-10-CM | POA: Diagnosis not present

## 2017-09-30 DIAGNOSIS — R1310 Dysphagia, unspecified: Secondary | ICD-10-CM | POA: Diagnosis not present

## 2017-09-30 DIAGNOSIS — F209 Schizophrenia, unspecified: Secondary | ICD-10-CM | POA: Diagnosis not present

## 2017-09-30 DIAGNOSIS — Z431 Encounter for attention to gastrostomy: Secondary | ICD-10-CM | POA: Diagnosis not present

## 2017-09-30 DIAGNOSIS — I69391 Dysphagia following cerebral infarction: Secondary | ICD-10-CM | POA: Diagnosis not present

## 2017-09-30 DIAGNOSIS — F329 Major depressive disorder, single episode, unspecified: Secondary | ICD-10-CM | POA: Diagnosis not present

## 2017-09-30 DIAGNOSIS — Z9981 Dependence on supplemental oxygen: Secondary | ICD-10-CM | POA: Diagnosis not present

## 2017-09-30 DIAGNOSIS — M24542 Contracture, left hand: Secondary | ICD-10-CM | POA: Diagnosis not present

## 2017-09-30 DIAGNOSIS — R630 Anorexia: Secondary | ICD-10-CM | POA: Diagnosis not present

## 2017-09-30 DIAGNOSIS — K219 Gastro-esophageal reflux disease without esophagitis: Secondary | ICD-10-CM | POA: Diagnosis not present

## 2017-09-30 DIAGNOSIS — I69320 Aphasia following cerebral infarction: Secondary | ICD-10-CM | POA: Diagnosis not present

## 2017-09-30 DIAGNOSIS — L89620 Pressure ulcer of left heel, unstageable: Secondary | ICD-10-CM | POA: Diagnosis not present

## 2017-10-01 DIAGNOSIS — R1310 Dysphagia, unspecified: Secondary | ICD-10-CM | POA: Diagnosis not present

## 2017-10-01 DIAGNOSIS — F419 Anxiety disorder, unspecified: Secondary | ICD-10-CM | POA: Diagnosis not present

## 2017-10-01 DIAGNOSIS — F209 Schizophrenia, unspecified: Secondary | ICD-10-CM | POA: Diagnosis not present

## 2017-10-01 DIAGNOSIS — I69354 Hemiplegia and hemiparesis following cerebral infarction affecting left non-dominant side: Secondary | ICD-10-CM | POA: Diagnosis not present

## 2017-10-01 DIAGNOSIS — F329 Major depressive disorder, single episode, unspecified: Secondary | ICD-10-CM | POA: Diagnosis not present

## 2017-10-01 DIAGNOSIS — I69391 Dysphagia following cerebral infarction: Secondary | ICD-10-CM | POA: Diagnosis not present

## 2017-10-02 ENCOUNTER — Telehealth: Payer: Self-pay

## 2017-10-02 DIAGNOSIS — F329 Major depressive disorder, single episode, unspecified: Secondary | ICD-10-CM | POA: Diagnosis not present

## 2017-10-02 DIAGNOSIS — I69354 Hemiplegia and hemiparesis following cerebral infarction affecting left non-dominant side: Secondary | ICD-10-CM | POA: Diagnosis not present

## 2017-10-02 DIAGNOSIS — F209 Schizophrenia, unspecified: Secondary | ICD-10-CM | POA: Diagnosis not present

## 2017-10-02 DIAGNOSIS — F419 Anxiety disorder, unspecified: Secondary | ICD-10-CM | POA: Diagnosis not present

## 2017-10-02 DIAGNOSIS — R1310 Dysphagia, unspecified: Secondary | ICD-10-CM | POA: Diagnosis not present

## 2017-10-02 DIAGNOSIS — I69391 Dysphagia following cerebral infarction: Secondary | ICD-10-CM | POA: Diagnosis not present

## 2017-10-02 NOTE — Telephone Encounter (Signed)
LMTCB and schedule AWV.  -MM 

## 2017-10-03 DIAGNOSIS — F419 Anxiety disorder, unspecified: Secondary | ICD-10-CM | POA: Diagnosis not present

## 2017-10-03 DIAGNOSIS — R1310 Dysphagia, unspecified: Secondary | ICD-10-CM | POA: Diagnosis not present

## 2017-10-03 DIAGNOSIS — I69354 Hemiplegia and hemiparesis following cerebral infarction affecting left non-dominant side: Secondary | ICD-10-CM | POA: Diagnosis not present

## 2017-10-03 DIAGNOSIS — I69391 Dysphagia following cerebral infarction: Secondary | ICD-10-CM | POA: Diagnosis not present

## 2017-10-03 DIAGNOSIS — F209 Schizophrenia, unspecified: Secondary | ICD-10-CM | POA: Diagnosis not present

## 2017-10-03 DIAGNOSIS — F329 Major depressive disorder, single episode, unspecified: Secondary | ICD-10-CM | POA: Diagnosis not present

## 2017-10-04 DIAGNOSIS — F209 Schizophrenia, unspecified: Secondary | ICD-10-CM | POA: Diagnosis not present

## 2017-10-04 DIAGNOSIS — R1310 Dysphagia, unspecified: Secondary | ICD-10-CM | POA: Diagnosis not present

## 2017-10-04 DIAGNOSIS — F329 Major depressive disorder, single episode, unspecified: Secondary | ICD-10-CM | POA: Diagnosis not present

## 2017-10-04 DIAGNOSIS — I69391 Dysphagia following cerebral infarction: Secondary | ICD-10-CM | POA: Diagnosis not present

## 2017-10-04 DIAGNOSIS — I69354 Hemiplegia and hemiparesis following cerebral infarction affecting left non-dominant side: Secondary | ICD-10-CM | POA: Diagnosis not present

## 2017-10-04 DIAGNOSIS — F419 Anxiety disorder, unspecified: Secondary | ICD-10-CM | POA: Diagnosis not present

## 2017-10-07 DIAGNOSIS — F209 Schizophrenia, unspecified: Secondary | ICD-10-CM | POA: Diagnosis not present

## 2017-10-07 DIAGNOSIS — I69354 Hemiplegia and hemiparesis following cerebral infarction affecting left non-dominant side: Secondary | ICD-10-CM | POA: Diagnosis not present

## 2017-10-07 DIAGNOSIS — R1310 Dysphagia, unspecified: Secondary | ICD-10-CM | POA: Diagnosis not present

## 2017-10-07 DIAGNOSIS — F329 Major depressive disorder, single episode, unspecified: Secondary | ICD-10-CM | POA: Diagnosis not present

## 2017-10-07 DIAGNOSIS — F419 Anxiety disorder, unspecified: Secondary | ICD-10-CM | POA: Diagnosis not present

## 2017-10-07 DIAGNOSIS — I69391 Dysphagia following cerebral infarction: Secondary | ICD-10-CM | POA: Diagnosis not present

## 2017-10-08 DIAGNOSIS — F209 Schizophrenia, unspecified: Secondary | ICD-10-CM | POA: Diagnosis not present

## 2017-10-08 DIAGNOSIS — R1310 Dysphagia, unspecified: Secondary | ICD-10-CM | POA: Diagnosis not present

## 2017-10-08 DIAGNOSIS — F329 Major depressive disorder, single episode, unspecified: Secondary | ICD-10-CM | POA: Diagnosis not present

## 2017-10-08 DIAGNOSIS — I69354 Hemiplegia and hemiparesis following cerebral infarction affecting left non-dominant side: Secondary | ICD-10-CM | POA: Diagnosis not present

## 2017-10-08 DIAGNOSIS — F419 Anxiety disorder, unspecified: Secondary | ICD-10-CM | POA: Diagnosis not present

## 2017-10-08 DIAGNOSIS — I69391 Dysphagia following cerebral infarction: Secondary | ICD-10-CM | POA: Diagnosis not present

## 2017-10-09 DIAGNOSIS — F209 Schizophrenia, unspecified: Secondary | ICD-10-CM | POA: Diagnosis not present

## 2017-10-09 DIAGNOSIS — F419 Anxiety disorder, unspecified: Secondary | ICD-10-CM | POA: Diagnosis not present

## 2017-10-09 DIAGNOSIS — R1310 Dysphagia, unspecified: Secondary | ICD-10-CM | POA: Diagnosis not present

## 2017-10-09 DIAGNOSIS — F329 Major depressive disorder, single episode, unspecified: Secondary | ICD-10-CM | POA: Diagnosis not present

## 2017-10-09 DIAGNOSIS — I69391 Dysphagia following cerebral infarction: Secondary | ICD-10-CM | POA: Diagnosis not present

## 2017-10-09 DIAGNOSIS — I69354 Hemiplegia and hemiparesis following cerebral infarction affecting left non-dominant side: Secondary | ICD-10-CM | POA: Diagnosis not present

## 2017-10-09 NOTE — Telephone Encounter (Signed)
This encounter was created in error - please disregard.

## 2017-10-10 DIAGNOSIS — I69391 Dysphagia following cerebral infarction: Secondary | ICD-10-CM | POA: Diagnosis not present

## 2017-10-10 DIAGNOSIS — F419 Anxiety disorder, unspecified: Secondary | ICD-10-CM | POA: Diagnosis not present

## 2017-10-10 DIAGNOSIS — F329 Major depressive disorder, single episode, unspecified: Secondary | ICD-10-CM | POA: Diagnosis not present

## 2017-10-10 DIAGNOSIS — R1310 Dysphagia, unspecified: Secondary | ICD-10-CM | POA: Diagnosis not present

## 2017-10-10 DIAGNOSIS — F209 Schizophrenia, unspecified: Secondary | ICD-10-CM | POA: Diagnosis not present

## 2017-10-10 DIAGNOSIS — I69354 Hemiplegia and hemiparesis following cerebral infarction affecting left non-dominant side: Secondary | ICD-10-CM | POA: Diagnosis not present

## 2017-10-11 DIAGNOSIS — F329 Major depressive disorder, single episode, unspecified: Secondary | ICD-10-CM | POA: Diagnosis not present

## 2017-10-11 DIAGNOSIS — R1310 Dysphagia, unspecified: Secondary | ICD-10-CM | POA: Diagnosis not present

## 2017-10-11 DIAGNOSIS — F419 Anxiety disorder, unspecified: Secondary | ICD-10-CM | POA: Diagnosis not present

## 2017-10-11 DIAGNOSIS — F209 Schizophrenia, unspecified: Secondary | ICD-10-CM | POA: Diagnosis not present

## 2017-10-11 DIAGNOSIS — I69354 Hemiplegia and hemiparesis following cerebral infarction affecting left non-dominant side: Secondary | ICD-10-CM | POA: Diagnosis not present

## 2017-10-11 DIAGNOSIS — I69391 Dysphagia following cerebral infarction: Secondary | ICD-10-CM | POA: Diagnosis not present

## 2017-10-14 DIAGNOSIS — F419 Anxiety disorder, unspecified: Secondary | ICD-10-CM | POA: Diagnosis not present

## 2017-10-14 DIAGNOSIS — I69391 Dysphagia following cerebral infarction: Secondary | ICD-10-CM | POA: Diagnosis not present

## 2017-10-14 DIAGNOSIS — R1310 Dysphagia, unspecified: Secondary | ICD-10-CM | POA: Diagnosis not present

## 2017-10-14 DIAGNOSIS — I69354 Hemiplegia and hemiparesis following cerebral infarction affecting left non-dominant side: Secondary | ICD-10-CM | POA: Diagnosis not present

## 2017-10-14 DIAGNOSIS — F209 Schizophrenia, unspecified: Secondary | ICD-10-CM | POA: Diagnosis not present

## 2017-10-14 DIAGNOSIS — F329 Major depressive disorder, single episode, unspecified: Secondary | ICD-10-CM | POA: Diagnosis not present

## 2017-10-15 DIAGNOSIS — F209 Schizophrenia, unspecified: Secondary | ICD-10-CM | POA: Diagnosis not present

## 2017-10-15 DIAGNOSIS — F419 Anxiety disorder, unspecified: Secondary | ICD-10-CM | POA: Diagnosis not present

## 2017-10-15 DIAGNOSIS — I69354 Hemiplegia and hemiparesis following cerebral infarction affecting left non-dominant side: Secondary | ICD-10-CM | POA: Diagnosis not present

## 2017-10-15 DIAGNOSIS — F329 Major depressive disorder, single episode, unspecified: Secondary | ICD-10-CM | POA: Diagnosis not present

## 2017-10-15 DIAGNOSIS — R1310 Dysphagia, unspecified: Secondary | ICD-10-CM | POA: Diagnosis not present

## 2017-10-15 DIAGNOSIS — I69391 Dysphagia following cerebral infarction: Secondary | ICD-10-CM | POA: Diagnosis not present

## 2017-10-16 DIAGNOSIS — R1310 Dysphagia, unspecified: Secondary | ICD-10-CM | POA: Diagnosis not present

## 2017-10-16 DIAGNOSIS — I69354 Hemiplegia and hemiparesis following cerebral infarction affecting left non-dominant side: Secondary | ICD-10-CM | POA: Diagnosis not present

## 2017-10-16 DIAGNOSIS — F209 Schizophrenia, unspecified: Secondary | ICD-10-CM | POA: Diagnosis not present

## 2017-10-16 DIAGNOSIS — F329 Major depressive disorder, single episode, unspecified: Secondary | ICD-10-CM | POA: Diagnosis not present

## 2017-10-16 DIAGNOSIS — I69391 Dysphagia following cerebral infarction: Secondary | ICD-10-CM | POA: Diagnosis not present

## 2017-10-16 DIAGNOSIS — F419 Anxiety disorder, unspecified: Secondary | ICD-10-CM | POA: Diagnosis not present

## 2017-10-17 DIAGNOSIS — F209 Schizophrenia, unspecified: Secondary | ICD-10-CM | POA: Diagnosis not present

## 2017-10-17 DIAGNOSIS — I69391 Dysphagia following cerebral infarction: Secondary | ICD-10-CM | POA: Diagnosis not present

## 2017-10-17 DIAGNOSIS — R1310 Dysphagia, unspecified: Secondary | ICD-10-CM | POA: Diagnosis not present

## 2017-10-17 DIAGNOSIS — I69354 Hemiplegia and hemiparesis following cerebral infarction affecting left non-dominant side: Secondary | ICD-10-CM | POA: Diagnosis not present

## 2017-10-17 DIAGNOSIS — F329 Major depressive disorder, single episode, unspecified: Secondary | ICD-10-CM | POA: Diagnosis not present

## 2017-10-17 DIAGNOSIS — F419 Anxiety disorder, unspecified: Secondary | ICD-10-CM | POA: Diagnosis not present

## 2017-10-18 DIAGNOSIS — I69391 Dysphagia following cerebral infarction: Secondary | ICD-10-CM | POA: Diagnosis not present

## 2017-10-18 DIAGNOSIS — F209 Schizophrenia, unspecified: Secondary | ICD-10-CM | POA: Diagnosis not present

## 2017-10-18 DIAGNOSIS — F419 Anxiety disorder, unspecified: Secondary | ICD-10-CM | POA: Diagnosis not present

## 2017-10-18 DIAGNOSIS — R1310 Dysphagia, unspecified: Secondary | ICD-10-CM | POA: Diagnosis not present

## 2017-10-18 DIAGNOSIS — I69354 Hemiplegia and hemiparesis following cerebral infarction affecting left non-dominant side: Secondary | ICD-10-CM | POA: Diagnosis not present

## 2017-10-18 DIAGNOSIS — F329 Major depressive disorder, single episode, unspecified: Secondary | ICD-10-CM | POA: Diagnosis not present

## 2017-10-21 DIAGNOSIS — F209 Schizophrenia, unspecified: Secondary | ICD-10-CM | POA: Diagnosis not present

## 2017-10-21 DIAGNOSIS — F329 Major depressive disorder, single episode, unspecified: Secondary | ICD-10-CM | POA: Diagnosis not present

## 2017-10-21 DIAGNOSIS — R1310 Dysphagia, unspecified: Secondary | ICD-10-CM | POA: Diagnosis not present

## 2017-10-21 DIAGNOSIS — F419 Anxiety disorder, unspecified: Secondary | ICD-10-CM | POA: Diagnosis not present

## 2017-10-21 DIAGNOSIS — I69391 Dysphagia following cerebral infarction: Secondary | ICD-10-CM | POA: Diagnosis not present

## 2017-10-21 DIAGNOSIS — I69354 Hemiplegia and hemiparesis following cerebral infarction affecting left non-dominant side: Secondary | ICD-10-CM | POA: Diagnosis not present

## 2017-10-22 DIAGNOSIS — F329 Major depressive disorder, single episode, unspecified: Secondary | ICD-10-CM | POA: Diagnosis not present

## 2017-10-22 DIAGNOSIS — I69354 Hemiplegia and hemiparesis following cerebral infarction affecting left non-dominant side: Secondary | ICD-10-CM | POA: Diagnosis not present

## 2017-10-22 DIAGNOSIS — I69391 Dysphagia following cerebral infarction: Secondary | ICD-10-CM | POA: Diagnosis not present

## 2017-10-22 DIAGNOSIS — R1310 Dysphagia, unspecified: Secondary | ICD-10-CM | POA: Diagnosis not present

## 2017-10-22 DIAGNOSIS — F419 Anxiety disorder, unspecified: Secondary | ICD-10-CM | POA: Diagnosis not present

## 2017-10-22 DIAGNOSIS — F209 Schizophrenia, unspecified: Secondary | ICD-10-CM | POA: Diagnosis not present

## 2017-10-23 DIAGNOSIS — I69391 Dysphagia following cerebral infarction: Secondary | ICD-10-CM | POA: Diagnosis not present

## 2017-10-23 DIAGNOSIS — F419 Anxiety disorder, unspecified: Secondary | ICD-10-CM | POA: Diagnosis not present

## 2017-10-23 DIAGNOSIS — F209 Schizophrenia, unspecified: Secondary | ICD-10-CM | POA: Diagnosis not present

## 2017-10-23 DIAGNOSIS — R1310 Dysphagia, unspecified: Secondary | ICD-10-CM | POA: Diagnosis not present

## 2017-10-23 DIAGNOSIS — F329 Major depressive disorder, single episode, unspecified: Secondary | ICD-10-CM | POA: Diagnosis not present

## 2017-10-23 DIAGNOSIS — I69354 Hemiplegia and hemiparesis following cerebral infarction affecting left non-dominant side: Secondary | ICD-10-CM | POA: Diagnosis not present

## 2017-10-24 DIAGNOSIS — F329 Major depressive disorder, single episode, unspecified: Secondary | ICD-10-CM | POA: Diagnosis not present

## 2017-10-24 DIAGNOSIS — F419 Anxiety disorder, unspecified: Secondary | ICD-10-CM | POA: Diagnosis not present

## 2017-10-24 DIAGNOSIS — I69354 Hemiplegia and hemiparesis following cerebral infarction affecting left non-dominant side: Secondary | ICD-10-CM | POA: Diagnosis not present

## 2017-10-24 DIAGNOSIS — R1310 Dysphagia, unspecified: Secondary | ICD-10-CM | POA: Diagnosis not present

## 2017-10-24 DIAGNOSIS — I69391 Dysphagia following cerebral infarction: Secondary | ICD-10-CM | POA: Diagnosis not present

## 2017-10-24 DIAGNOSIS — F209 Schizophrenia, unspecified: Secondary | ICD-10-CM | POA: Diagnosis not present

## 2017-10-25 DIAGNOSIS — F209 Schizophrenia, unspecified: Secondary | ICD-10-CM | POA: Diagnosis not present

## 2017-10-25 DIAGNOSIS — R1310 Dysphagia, unspecified: Secondary | ICD-10-CM | POA: Diagnosis not present

## 2017-10-25 DIAGNOSIS — I69354 Hemiplegia and hemiparesis following cerebral infarction affecting left non-dominant side: Secondary | ICD-10-CM | POA: Diagnosis not present

## 2017-10-25 DIAGNOSIS — F329 Major depressive disorder, single episode, unspecified: Secondary | ICD-10-CM | POA: Diagnosis not present

## 2017-10-25 DIAGNOSIS — I69391 Dysphagia following cerebral infarction: Secondary | ICD-10-CM | POA: Diagnosis not present

## 2017-10-25 DIAGNOSIS — F419 Anxiety disorder, unspecified: Secondary | ICD-10-CM | POA: Diagnosis not present

## 2017-10-28 DIAGNOSIS — F329 Major depressive disorder, single episode, unspecified: Secondary | ICD-10-CM | POA: Diagnosis not present

## 2017-10-28 DIAGNOSIS — R1310 Dysphagia, unspecified: Secondary | ICD-10-CM | POA: Diagnosis not present

## 2017-10-28 DIAGNOSIS — F419 Anxiety disorder, unspecified: Secondary | ICD-10-CM | POA: Diagnosis not present

## 2017-10-28 DIAGNOSIS — I69391 Dysphagia following cerebral infarction: Secondary | ICD-10-CM | POA: Diagnosis not present

## 2017-10-28 DIAGNOSIS — I69354 Hemiplegia and hemiparesis following cerebral infarction affecting left non-dominant side: Secondary | ICD-10-CM | POA: Diagnosis not present

## 2017-10-28 DIAGNOSIS — F209 Schizophrenia, unspecified: Secondary | ICD-10-CM | POA: Diagnosis not present

## 2017-10-29 DIAGNOSIS — F209 Schizophrenia, unspecified: Secondary | ICD-10-CM | POA: Diagnosis not present

## 2017-10-29 DIAGNOSIS — I69354 Hemiplegia and hemiparesis following cerebral infarction affecting left non-dominant side: Secondary | ICD-10-CM | POA: Diagnosis not present

## 2017-10-29 DIAGNOSIS — F419 Anxiety disorder, unspecified: Secondary | ICD-10-CM | POA: Diagnosis not present

## 2017-10-29 DIAGNOSIS — I69391 Dysphagia following cerebral infarction: Secondary | ICD-10-CM | POA: Diagnosis not present

## 2017-10-29 DIAGNOSIS — R1310 Dysphagia, unspecified: Secondary | ICD-10-CM | POA: Diagnosis not present

## 2017-10-29 DIAGNOSIS — F329 Major depressive disorder, single episode, unspecified: Secondary | ICD-10-CM | POA: Diagnosis not present

## 2017-10-30 DIAGNOSIS — I69391 Dysphagia following cerebral infarction: Secondary | ICD-10-CM | POA: Diagnosis not present

## 2017-10-30 DIAGNOSIS — G8929 Other chronic pain: Secondary | ICD-10-CM | POA: Diagnosis not present

## 2017-10-30 DIAGNOSIS — J449 Chronic obstructive pulmonary disease, unspecified: Secondary | ICD-10-CM | POA: Diagnosis not present

## 2017-10-30 DIAGNOSIS — K219 Gastro-esophageal reflux disease without esophagitis: Secondary | ICD-10-CM | POA: Diagnosis not present

## 2017-10-30 DIAGNOSIS — Z7401 Bed confinement status: Secondary | ICD-10-CM | POA: Diagnosis not present

## 2017-10-30 DIAGNOSIS — I69320 Aphasia following cerebral infarction: Secondary | ICD-10-CM | POA: Diagnosis not present

## 2017-10-30 DIAGNOSIS — L89620 Pressure ulcer of left heel, unstageable: Secondary | ICD-10-CM | POA: Diagnosis not present

## 2017-10-30 DIAGNOSIS — L89322 Pressure ulcer of left buttock, stage 2: Secondary | ICD-10-CM | POA: Diagnosis not present

## 2017-10-30 DIAGNOSIS — F209 Schizophrenia, unspecified: Secondary | ICD-10-CM | POA: Diagnosis not present

## 2017-10-30 DIAGNOSIS — F329 Major depressive disorder, single episode, unspecified: Secondary | ICD-10-CM | POA: Diagnosis not present

## 2017-10-30 DIAGNOSIS — F419 Anxiety disorder, unspecified: Secondary | ICD-10-CM | POA: Diagnosis not present

## 2017-10-30 DIAGNOSIS — Z9981 Dependence on supplemental oxygen: Secondary | ICD-10-CM | POA: Diagnosis not present

## 2017-10-30 DIAGNOSIS — R630 Anorexia: Secondary | ICD-10-CM | POA: Diagnosis not present

## 2017-10-30 DIAGNOSIS — Z431 Encounter for attention to gastrostomy: Secondary | ICD-10-CM | POA: Diagnosis not present

## 2017-10-30 DIAGNOSIS — R1310 Dysphagia, unspecified: Secondary | ICD-10-CM | POA: Diagnosis not present

## 2017-10-30 DIAGNOSIS — M810 Age-related osteoporosis without current pathological fracture: Secondary | ICD-10-CM | POA: Diagnosis not present

## 2017-10-30 DIAGNOSIS — M24542 Contracture, left hand: Secondary | ICD-10-CM | POA: Diagnosis not present

## 2017-10-30 DIAGNOSIS — I69354 Hemiplegia and hemiparesis following cerebral infarction affecting left non-dominant side: Secondary | ICD-10-CM | POA: Diagnosis not present

## 2017-10-30 IMAGING — CR DG CHEST 2V
1 series · 3 of 3 positions shown · non-contrast
Comparison: 01/18/2014

CLINICAL DATA: Shortness of breath this morning. Cough. History of
COPD.

EXAM:
CHEST  2 VIEW

[Series 1: dg chest 2 view · 0.14mm/px · 3 of 3 slices shown]
[im 1/3]
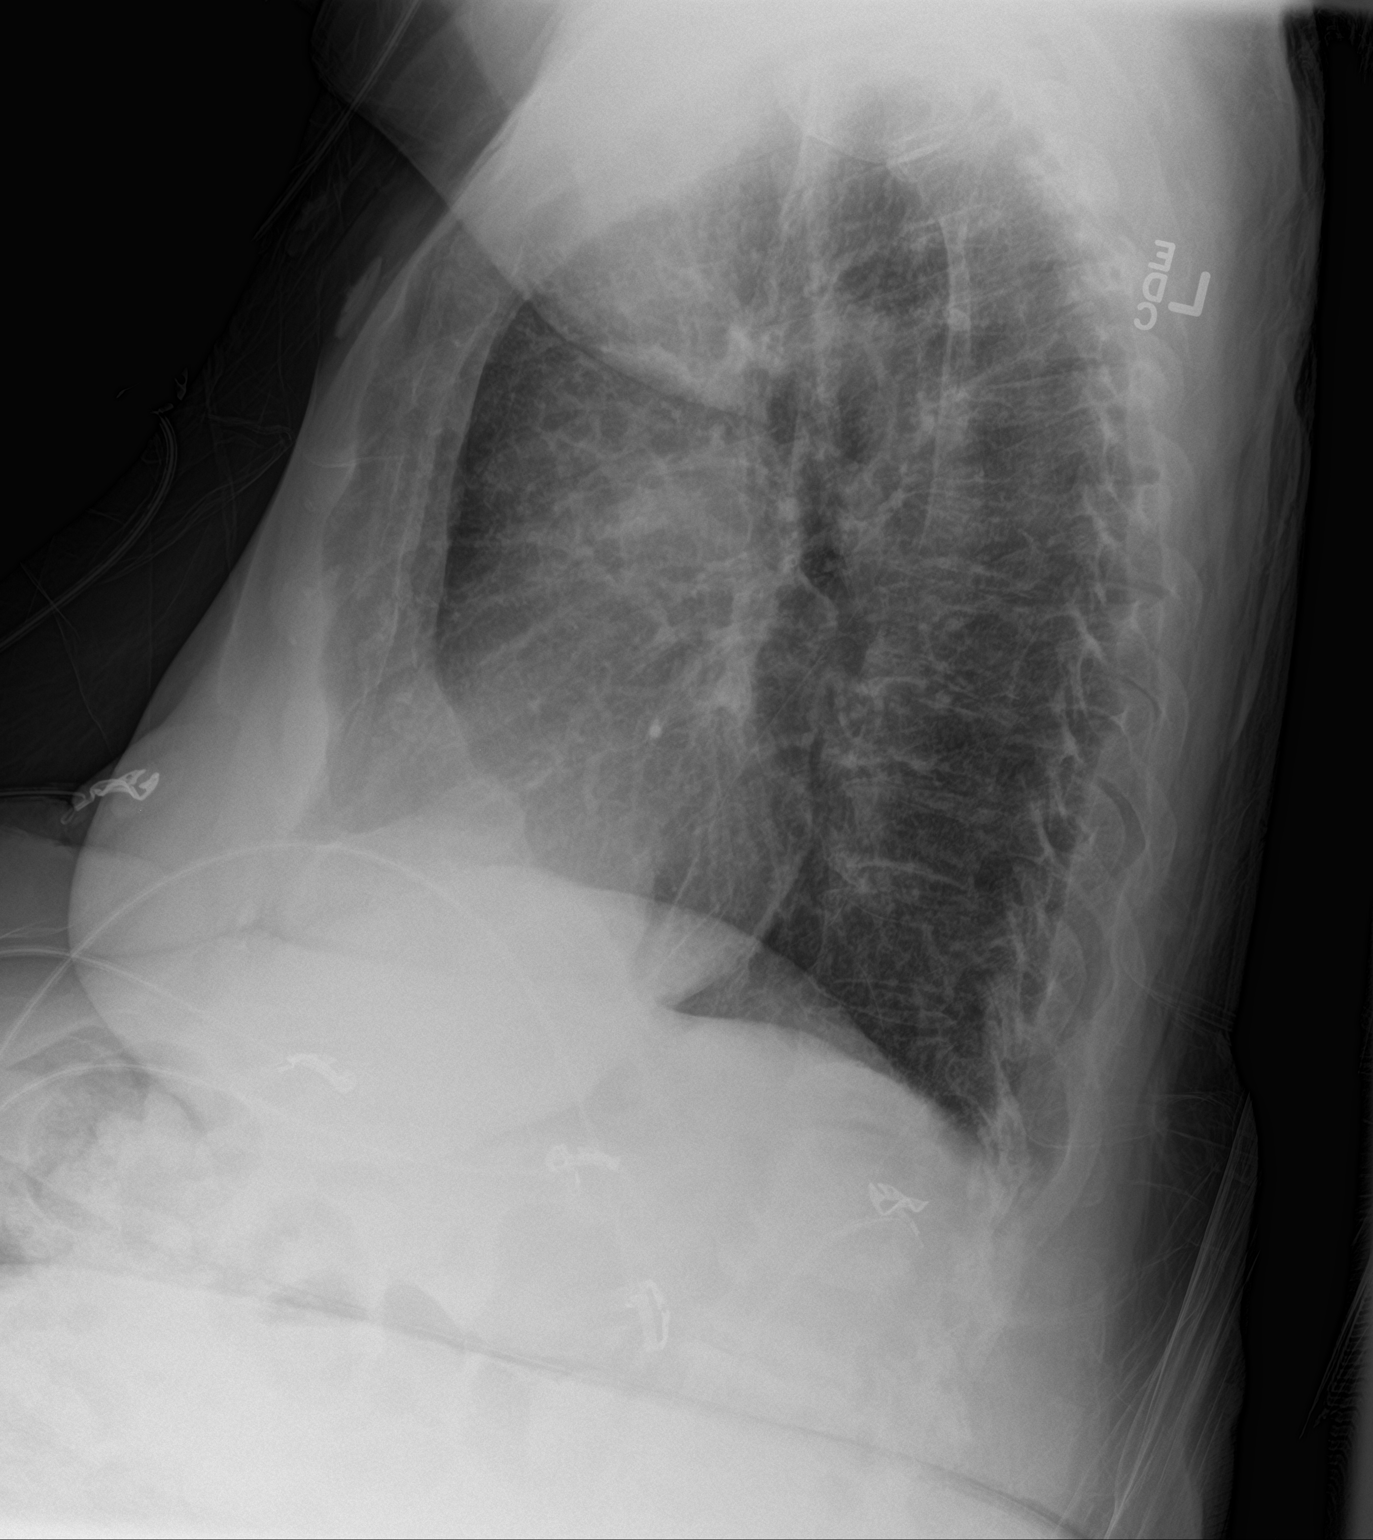
[im 2/3]
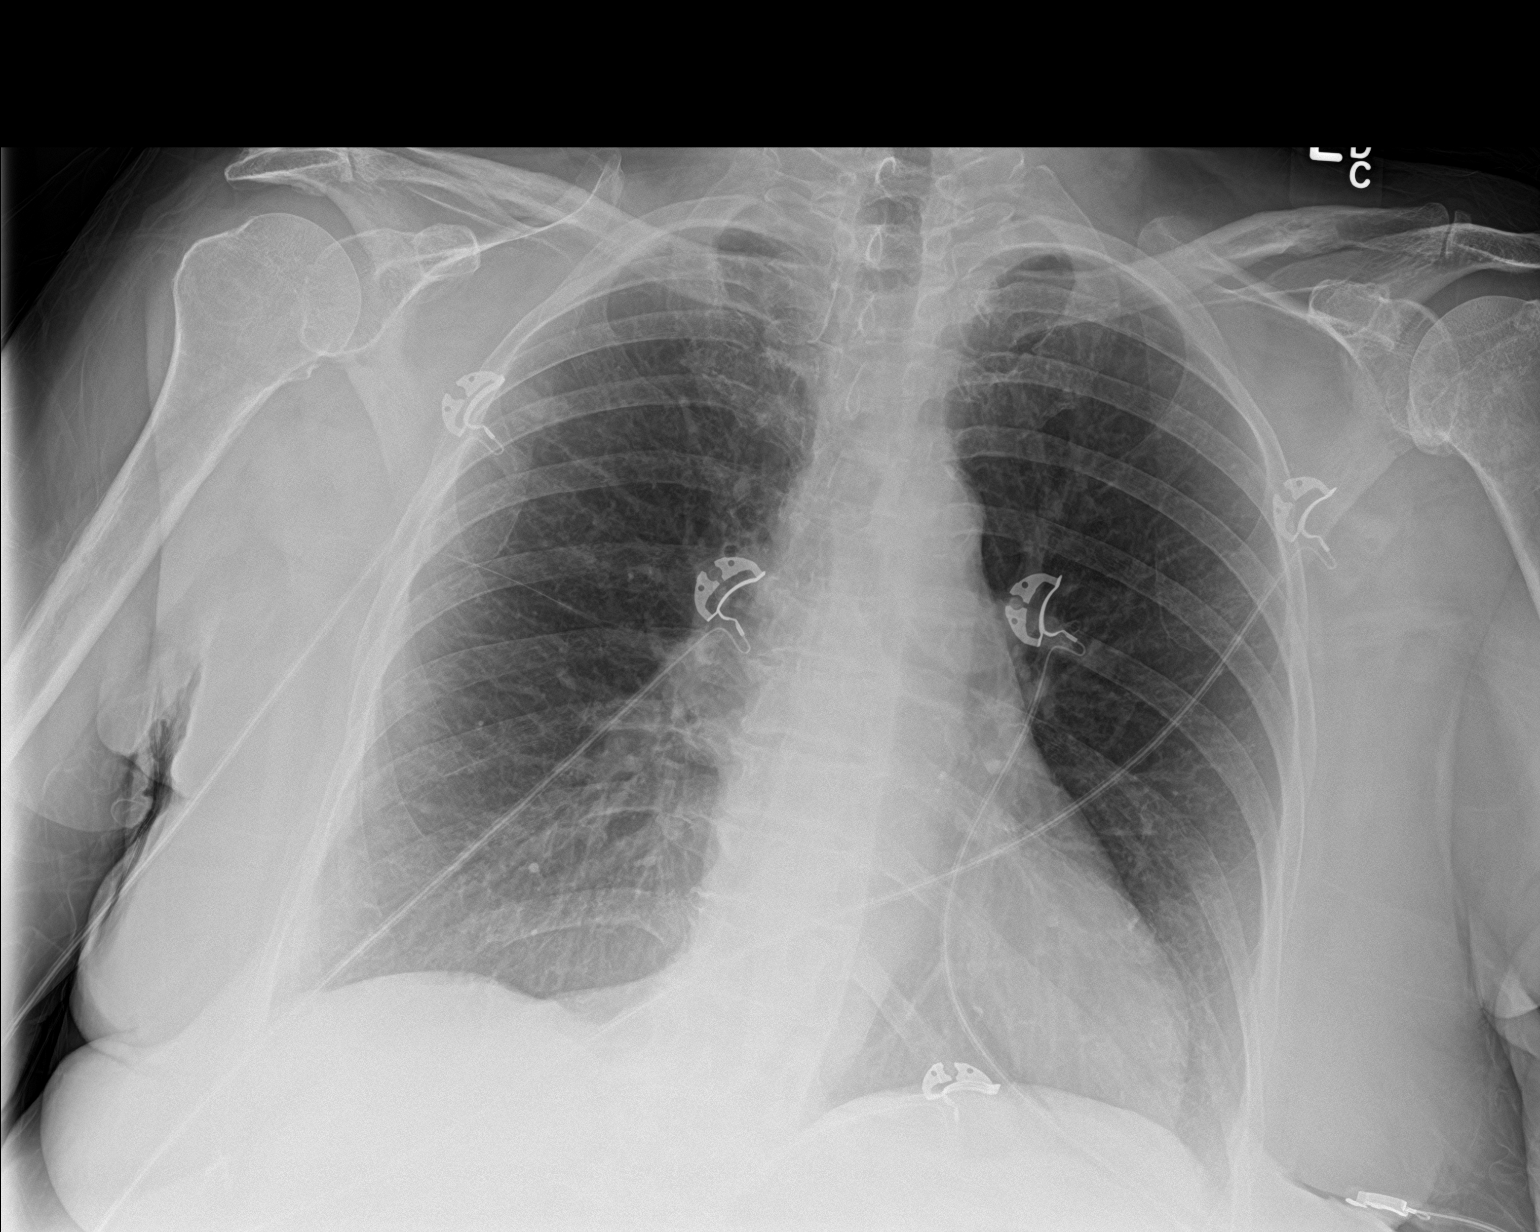
[im 3/3]
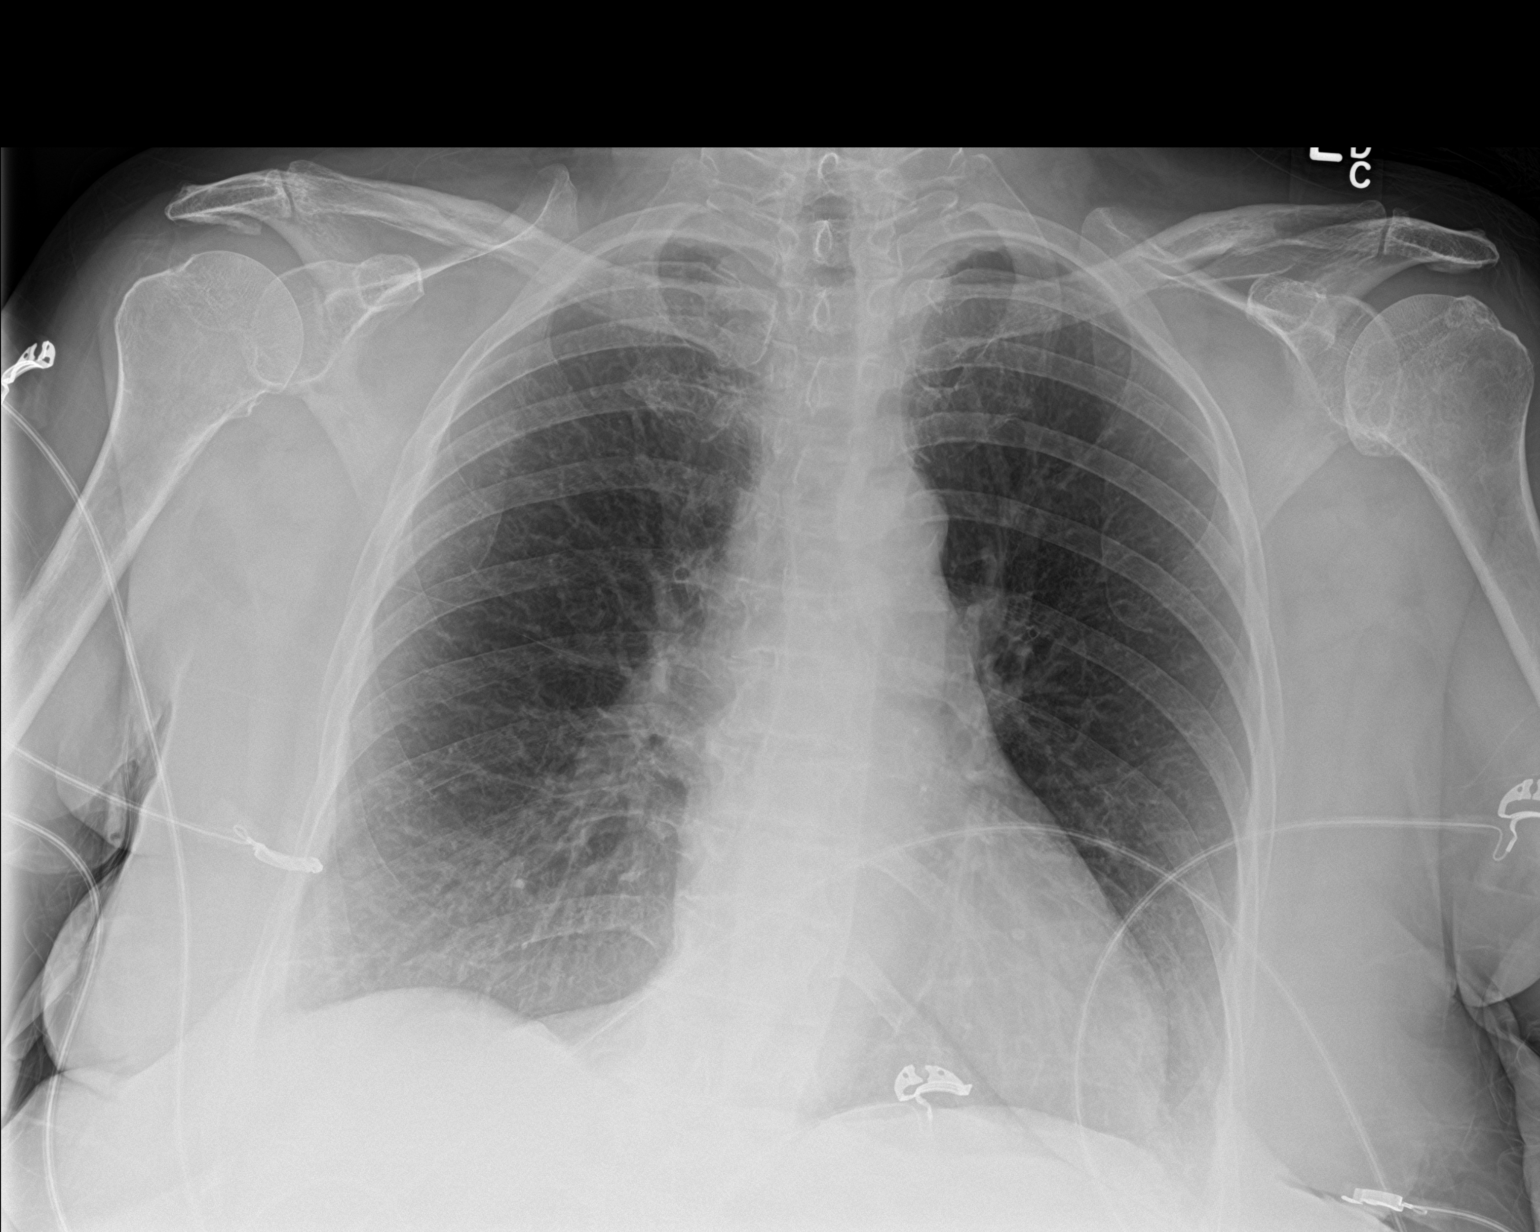

[3 of 3 positions shown; findings below may reference images not displayed]

FINDINGS: The heart size and mediastinal contours are within normal limits.
Both lungs are clear. The visualized skeletal structures are
unremarkable.
IMPRESSION: No active cardiopulmonary disease.

## 2017-10-31 DIAGNOSIS — F329 Major depressive disorder, single episode, unspecified: Secondary | ICD-10-CM | POA: Diagnosis not present

## 2017-10-31 DIAGNOSIS — F209 Schizophrenia, unspecified: Secondary | ICD-10-CM | POA: Diagnosis not present

## 2017-10-31 DIAGNOSIS — R1310 Dysphagia, unspecified: Secondary | ICD-10-CM | POA: Diagnosis not present

## 2017-10-31 DIAGNOSIS — I69391 Dysphagia following cerebral infarction: Secondary | ICD-10-CM | POA: Diagnosis not present

## 2017-10-31 DIAGNOSIS — I69354 Hemiplegia and hemiparesis following cerebral infarction affecting left non-dominant side: Secondary | ICD-10-CM | POA: Diagnosis not present

## 2017-10-31 DIAGNOSIS — I69320 Aphasia following cerebral infarction: Secondary | ICD-10-CM | POA: Diagnosis not present

## 2017-11-01 DIAGNOSIS — I69354 Hemiplegia and hemiparesis following cerebral infarction affecting left non-dominant side: Secondary | ICD-10-CM | POA: Diagnosis not present

## 2017-11-01 DIAGNOSIS — I69320 Aphasia following cerebral infarction: Secondary | ICD-10-CM | POA: Diagnosis not present

## 2017-11-01 DIAGNOSIS — I69391 Dysphagia following cerebral infarction: Secondary | ICD-10-CM | POA: Diagnosis not present

## 2017-11-01 DIAGNOSIS — R1310 Dysphagia, unspecified: Secondary | ICD-10-CM | POA: Diagnosis not present

## 2017-11-01 DIAGNOSIS — F209 Schizophrenia, unspecified: Secondary | ICD-10-CM | POA: Diagnosis not present

## 2017-11-01 DIAGNOSIS — F329 Major depressive disorder, single episode, unspecified: Secondary | ICD-10-CM | POA: Diagnosis not present

## 2017-11-04 DIAGNOSIS — I69354 Hemiplegia and hemiparesis following cerebral infarction affecting left non-dominant side: Secondary | ICD-10-CM | POA: Diagnosis not present

## 2017-11-04 DIAGNOSIS — F329 Major depressive disorder, single episode, unspecified: Secondary | ICD-10-CM | POA: Diagnosis not present

## 2017-11-04 DIAGNOSIS — I69391 Dysphagia following cerebral infarction: Secondary | ICD-10-CM | POA: Diagnosis not present

## 2017-11-04 DIAGNOSIS — I69320 Aphasia following cerebral infarction: Secondary | ICD-10-CM | POA: Diagnosis not present

## 2017-11-04 DIAGNOSIS — R1310 Dysphagia, unspecified: Secondary | ICD-10-CM | POA: Diagnosis not present

## 2017-11-04 DIAGNOSIS — F209 Schizophrenia, unspecified: Secondary | ICD-10-CM | POA: Diagnosis not present

## 2017-11-05 DIAGNOSIS — R1310 Dysphagia, unspecified: Secondary | ICD-10-CM | POA: Diagnosis not present

## 2017-11-05 DIAGNOSIS — I69320 Aphasia following cerebral infarction: Secondary | ICD-10-CM | POA: Diagnosis not present

## 2017-11-05 DIAGNOSIS — I69391 Dysphagia following cerebral infarction: Secondary | ICD-10-CM | POA: Diagnosis not present

## 2017-11-05 DIAGNOSIS — I69354 Hemiplegia and hemiparesis following cerebral infarction affecting left non-dominant side: Secondary | ICD-10-CM | POA: Diagnosis not present

## 2017-11-05 DIAGNOSIS — F329 Major depressive disorder, single episode, unspecified: Secondary | ICD-10-CM | POA: Diagnosis not present

## 2017-11-05 DIAGNOSIS — F209 Schizophrenia, unspecified: Secondary | ICD-10-CM | POA: Diagnosis not present

## 2017-11-06 DIAGNOSIS — I69320 Aphasia following cerebral infarction: Secondary | ICD-10-CM | POA: Diagnosis not present

## 2017-11-06 DIAGNOSIS — F329 Major depressive disorder, single episode, unspecified: Secondary | ICD-10-CM | POA: Diagnosis not present

## 2017-11-06 DIAGNOSIS — I69354 Hemiplegia and hemiparesis following cerebral infarction affecting left non-dominant side: Secondary | ICD-10-CM | POA: Diagnosis not present

## 2017-11-06 DIAGNOSIS — R1310 Dysphagia, unspecified: Secondary | ICD-10-CM | POA: Diagnosis not present

## 2017-11-06 DIAGNOSIS — F209 Schizophrenia, unspecified: Secondary | ICD-10-CM | POA: Diagnosis not present

## 2017-11-06 DIAGNOSIS — I69391 Dysphagia following cerebral infarction: Secondary | ICD-10-CM | POA: Diagnosis not present

## 2017-11-07 DIAGNOSIS — F329 Major depressive disorder, single episode, unspecified: Secondary | ICD-10-CM | POA: Diagnosis not present

## 2017-11-07 DIAGNOSIS — R1310 Dysphagia, unspecified: Secondary | ICD-10-CM | POA: Diagnosis not present

## 2017-11-07 DIAGNOSIS — I69354 Hemiplegia and hemiparesis following cerebral infarction affecting left non-dominant side: Secondary | ICD-10-CM | POA: Diagnosis not present

## 2017-11-07 DIAGNOSIS — I69391 Dysphagia following cerebral infarction: Secondary | ICD-10-CM | POA: Diagnosis not present

## 2017-11-07 DIAGNOSIS — I69320 Aphasia following cerebral infarction: Secondary | ICD-10-CM | POA: Diagnosis not present

## 2017-11-07 DIAGNOSIS — F209 Schizophrenia, unspecified: Secondary | ICD-10-CM | POA: Diagnosis not present

## 2017-11-08 DIAGNOSIS — I69391 Dysphagia following cerebral infarction: Secondary | ICD-10-CM | POA: Diagnosis not present

## 2017-11-08 DIAGNOSIS — F329 Major depressive disorder, single episode, unspecified: Secondary | ICD-10-CM | POA: Diagnosis not present

## 2017-11-08 DIAGNOSIS — F209 Schizophrenia, unspecified: Secondary | ICD-10-CM | POA: Diagnosis not present

## 2017-11-08 DIAGNOSIS — I69320 Aphasia following cerebral infarction: Secondary | ICD-10-CM | POA: Diagnosis not present

## 2017-11-08 DIAGNOSIS — I69354 Hemiplegia and hemiparesis following cerebral infarction affecting left non-dominant side: Secondary | ICD-10-CM | POA: Diagnosis not present

## 2017-11-08 DIAGNOSIS — R1310 Dysphagia, unspecified: Secondary | ICD-10-CM | POA: Diagnosis not present

## 2017-11-11 DIAGNOSIS — I69354 Hemiplegia and hemiparesis following cerebral infarction affecting left non-dominant side: Secondary | ICD-10-CM | POA: Diagnosis not present

## 2017-11-11 DIAGNOSIS — R1310 Dysphagia, unspecified: Secondary | ICD-10-CM | POA: Diagnosis not present

## 2017-11-11 DIAGNOSIS — F329 Major depressive disorder, single episode, unspecified: Secondary | ICD-10-CM | POA: Diagnosis not present

## 2017-11-11 DIAGNOSIS — I69391 Dysphagia following cerebral infarction: Secondary | ICD-10-CM | POA: Diagnosis not present

## 2017-11-11 DIAGNOSIS — I69320 Aphasia following cerebral infarction: Secondary | ICD-10-CM | POA: Diagnosis not present

## 2017-11-11 DIAGNOSIS — F209 Schizophrenia, unspecified: Secondary | ICD-10-CM | POA: Diagnosis not present

## 2017-11-12 DIAGNOSIS — I69354 Hemiplegia and hemiparesis following cerebral infarction affecting left non-dominant side: Secondary | ICD-10-CM | POA: Diagnosis not present

## 2017-11-12 DIAGNOSIS — I69391 Dysphagia following cerebral infarction: Secondary | ICD-10-CM | POA: Diagnosis not present

## 2017-11-12 DIAGNOSIS — R1310 Dysphagia, unspecified: Secondary | ICD-10-CM | POA: Diagnosis not present

## 2017-11-12 DIAGNOSIS — F209 Schizophrenia, unspecified: Secondary | ICD-10-CM | POA: Diagnosis not present

## 2017-11-12 DIAGNOSIS — F329 Major depressive disorder, single episode, unspecified: Secondary | ICD-10-CM | POA: Diagnosis not present

## 2017-11-12 DIAGNOSIS — I69320 Aphasia following cerebral infarction: Secondary | ICD-10-CM | POA: Diagnosis not present

## 2017-11-13 DIAGNOSIS — F209 Schizophrenia, unspecified: Secondary | ICD-10-CM | POA: Diagnosis not present

## 2017-11-13 DIAGNOSIS — I69354 Hemiplegia and hemiparesis following cerebral infarction affecting left non-dominant side: Secondary | ICD-10-CM | POA: Diagnosis not present

## 2017-11-13 DIAGNOSIS — I69320 Aphasia following cerebral infarction: Secondary | ICD-10-CM | POA: Diagnosis not present

## 2017-11-13 DIAGNOSIS — R1310 Dysphagia, unspecified: Secondary | ICD-10-CM | POA: Diagnosis not present

## 2017-11-13 DIAGNOSIS — F329 Major depressive disorder, single episode, unspecified: Secondary | ICD-10-CM | POA: Diagnosis not present

## 2017-11-13 DIAGNOSIS — I69391 Dysphagia following cerebral infarction: Secondary | ICD-10-CM | POA: Diagnosis not present

## 2017-11-14 DIAGNOSIS — I69354 Hemiplegia and hemiparesis following cerebral infarction affecting left non-dominant side: Secondary | ICD-10-CM | POA: Diagnosis not present

## 2017-11-14 DIAGNOSIS — F329 Major depressive disorder, single episode, unspecified: Secondary | ICD-10-CM | POA: Diagnosis not present

## 2017-11-14 DIAGNOSIS — I69320 Aphasia following cerebral infarction: Secondary | ICD-10-CM | POA: Diagnosis not present

## 2017-11-14 DIAGNOSIS — F209 Schizophrenia, unspecified: Secondary | ICD-10-CM | POA: Diagnosis not present

## 2017-11-14 DIAGNOSIS — R1310 Dysphagia, unspecified: Secondary | ICD-10-CM | POA: Diagnosis not present

## 2017-11-14 DIAGNOSIS — I69391 Dysphagia following cerebral infarction: Secondary | ICD-10-CM | POA: Diagnosis not present

## 2017-11-15 DIAGNOSIS — I69391 Dysphagia following cerebral infarction: Secondary | ICD-10-CM | POA: Diagnosis not present

## 2017-11-15 DIAGNOSIS — F329 Major depressive disorder, single episode, unspecified: Secondary | ICD-10-CM | POA: Diagnosis not present

## 2017-11-15 DIAGNOSIS — L03031 Cellulitis of right toe: Secondary | ICD-10-CM | POA: Diagnosis not present

## 2017-11-15 DIAGNOSIS — I69354 Hemiplegia and hemiparesis following cerebral infarction affecting left non-dominant side: Secondary | ICD-10-CM | POA: Diagnosis not present

## 2017-11-15 DIAGNOSIS — I69320 Aphasia following cerebral infarction: Secondary | ICD-10-CM | POA: Diagnosis not present

## 2017-11-15 DIAGNOSIS — B351 Tinea unguium: Secondary | ICD-10-CM | POA: Diagnosis not present

## 2017-11-15 DIAGNOSIS — F209 Schizophrenia, unspecified: Secondary | ICD-10-CM | POA: Diagnosis not present

## 2017-11-15 DIAGNOSIS — R1310 Dysphagia, unspecified: Secondary | ICD-10-CM | POA: Diagnosis not present

## 2017-11-18 DIAGNOSIS — F329 Major depressive disorder, single episode, unspecified: Secondary | ICD-10-CM | POA: Diagnosis not present

## 2017-11-18 DIAGNOSIS — I69354 Hemiplegia and hemiparesis following cerebral infarction affecting left non-dominant side: Secondary | ICD-10-CM | POA: Diagnosis not present

## 2017-11-18 DIAGNOSIS — I69320 Aphasia following cerebral infarction: Secondary | ICD-10-CM | POA: Diagnosis not present

## 2017-11-18 DIAGNOSIS — I69391 Dysphagia following cerebral infarction: Secondary | ICD-10-CM | POA: Diagnosis not present

## 2017-11-18 DIAGNOSIS — F209 Schizophrenia, unspecified: Secondary | ICD-10-CM | POA: Diagnosis not present

## 2017-11-18 DIAGNOSIS — R1310 Dysphagia, unspecified: Secondary | ICD-10-CM | POA: Diagnosis not present

## 2017-11-19 DIAGNOSIS — I69320 Aphasia following cerebral infarction: Secondary | ICD-10-CM | POA: Diagnosis not present

## 2017-11-19 DIAGNOSIS — F209 Schizophrenia, unspecified: Secondary | ICD-10-CM | POA: Diagnosis not present

## 2017-11-19 DIAGNOSIS — I69391 Dysphagia following cerebral infarction: Secondary | ICD-10-CM | POA: Diagnosis not present

## 2017-11-19 DIAGNOSIS — R1310 Dysphagia, unspecified: Secondary | ICD-10-CM | POA: Diagnosis not present

## 2017-11-19 DIAGNOSIS — F329 Major depressive disorder, single episode, unspecified: Secondary | ICD-10-CM | POA: Diagnosis not present

## 2017-11-19 DIAGNOSIS — I69354 Hemiplegia and hemiparesis following cerebral infarction affecting left non-dominant side: Secondary | ICD-10-CM | POA: Diagnosis not present

## 2017-11-20 DIAGNOSIS — I69391 Dysphagia following cerebral infarction: Secondary | ICD-10-CM | POA: Diagnosis not present

## 2017-11-20 DIAGNOSIS — F329 Major depressive disorder, single episode, unspecified: Secondary | ICD-10-CM | POA: Diagnosis not present

## 2017-11-20 DIAGNOSIS — I69320 Aphasia following cerebral infarction: Secondary | ICD-10-CM | POA: Diagnosis not present

## 2017-11-20 DIAGNOSIS — F209 Schizophrenia, unspecified: Secondary | ICD-10-CM | POA: Diagnosis not present

## 2017-11-20 DIAGNOSIS — I69354 Hemiplegia and hemiparesis following cerebral infarction affecting left non-dominant side: Secondary | ICD-10-CM | POA: Diagnosis not present

## 2017-11-20 DIAGNOSIS — R1310 Dysphagia, unspecified: Secondary | ICD-10-CM | POA: Diagnosis not present

## 2017-11-21 DIAGNOSIS — I69320 Aphasia following cerebral infarction: Secondary | ICD-10-CM | POA: Diagnosis not present

## 2017-11-21 DIAGNOSIS — G8194 Hemiplegia, unspecified affecting left nondominant side: Secondary | ICD-10-CM | POA: Diagnosis not present

## 2017-11-21 DIAGNOSIS — F200 Paranoid schizophrenia: Secondary | ICD-10-CM | POA: Diagnosis not present

## 2017-11-21 DIAGNOSIS — I69354 Hemiplegia and hemiparesis following cerebral infarction affecting left non-dominant side: Secondary | ICD-10-CM | POA: Diagnosis not present

## 2017-11-21 DIAGNOSIS — M48061 Spinal stenosis, lumbar region without neurogenic claudication: Secondary | ICD-10-CM | POA: Diagnosis not present

## 2017-11-21 DIAGNOSIS — F329 Major depressive disorder, single episode, unspecified: Secondary | ICD-10-CM | POA: Diagnosis not present

## 2017-11-21 DIAGNOSIS — R1310 Dysphagia, unspecified: Secondary | ICD-10-CM | POA: Diagnosis not present

## 2017-11-21 DIAGNOSIS — L89620 Pressure ulcer of left heel, unstageable: Secondary | ICD-10-CM | POA: Diagnosis not present

## 2017-11-21 DIAGNOSIS — F209 Schizophrenia, unspecified: Secondary | ICD-10-CM | POA: Diagnosis not present

## 2017-11-21 DIAGNOSIS — G40909 Epilepsy, unspecified, not intractable, without status epilepticus: Secondary | ICD-10-CM | POA: Diagnosis not present

## 2017-11-21 DIAGNOSIS — I69391 Dysphagia following cerebral infarction: Secondary | ICD-10-CM | POA: Diagnosis not present

## 2017-11-21 DIAGNOSIS — J439 Emphysema, unspecified: Secondary | ICD-10-CM | POA: Diagnosis not present

## 2017-11-22 DIAGNOSIS — I69391 Dysphagia following cerebral infarction: Secondary | ICD-10-CM | POA: Diagnosis not present

## 2017-11-22 DIAGNOSIS — I69320 Aphasia following cerebral infarction: Secondary | ICD-10-CM | POA: Diagnosis not present

## 2017-11-22 DIAGNOSIS — I69354 Hemiplegia and hemiparesis following cerebral infarction affecting left non-dominant side: Secondary | ICD-10-CM | POA: Diagnosis not present

## 2017-11-22 DIAGNOSIS — F329 Major depressive disorder, single episode, unspecified: Secondary | ICD-10-CM | POA: Diagnosis not present

## 2017-11-22 DIAGNOSIS — F209 Schizophrenia, unspecified: Secondary | ICD-10-CM | POA: Diagnosis not present

## 2017-11-22 DIAGNOSIS — R1310 Dysphagia, unspecified: Secondary | ICD-10-CM | POA: Diagnosis not present

## 2017-11-26 DIAGNOSIS — R1310 Dysphagia, unspecified: Secondary | ICD-10-CM | POA: Diagnosis not present

## 2017-11-26 DIAGNOSIS — I69354 Hemiplegia and hemiparesis following cerebral infarction affecting left non-dominant side: Secondary | ICD-10-CM | POA: Diagnosis not present

## 2017-11-26 DIAGNOSIS — F329 Major depressive disorder, single episode, unspecified: Secondary | ICD-10-CM | POA: Diagnosis not present

## 2017-11-26 DIAGNOSIS — I69391 Dysphagia following cerebral infarction: Secondary | ICD-10-CM | POA: Diagnosis not present

## 2017-11-26 DIAGNOSIS — F209 Schizophrenia, unspecified: Secondary | ICD-10-CM | POA: Diagnosis not present

## 2017-11-26 DIAGNOSIS — I69320 Aphasia following cerebral infarction: Secondary | ICD-10-CM | POA: Diagnosis not present

## 2017-11-27 DIAGNOSIS — F329 Major depressive disorder, single episode, unspecified: Secondary | ICD-10-CM | POA: Diagnosis not present

## 2017-11-27 DIAGNOSIS — R1310 Dysphagia, unspecified: Secondary | ICD-10-CM | POA: Diagnosis not present

## 2017-11-27 DIAGNOSIS — I69320 Aphasia following cerebral infarction: Secondary | ICD-10-CM | POA: Diagnosis not present

## 2017-11-27 DIAGNOSIS — F209 Schizophrenia, unspecified: Secondary | ICD-10-CM | POA: Diagnosis not present

## 2017-11-27 DIAGNOSIS — I69391 Dysphagia following cerebral infarction: Secondary | ICD-10-CM | POA: Diagnosis not present

## 2017-11-27 DIAGNOSIS — I69354 Hemiplegia and hemiparesis following cerebral infarction affecting left non-dominant side: Secondary | ICD-10-CM | POA: Diagnosis not present

## 2017-11-28 DIAGNOSIS — I69320 Aphasia following cerebral infarction: Secondary | ICD-10-CM | POA: Diagnosis not present

## 2017-11-28 DIAGNOSIS — I69354 Hemiplegia and hemiparesis following cerebral infarction affecting left non-dominant side: Secondary | ICD-10-CM | POA: Diagnosis not present

## 2017-11-28 DIAGNOSIS — F209 Schizophrenia, unspecified: Secondary | ICD-10-CM | POA: Diagnosis not present

## 2017-11-28 DIAGNOSIS — F329 Major depressive disorder, single episode, unspecified: Secondary | ICD-10-CM | POA: Diagnosis not present

## 2017-11-28 DIAGNOSIS — R1310 Dysphagia, unspecified: Secondary | ICD-10-CM | POA: Diagnosis not present

## 2017-11-28 DIAGNOSIS — I69391 Dysphagia following cerebral infarction: Secondary | ICD-10-CM | POA: Diagnosis not present

## 2017-11-29 DIAGNOSIS — F329 Major depressive disorder, single episode, unspecified: Secondary | ICD-10-CM | POA: Diagnosis not present

## 2017-11-29 DIAGNOSIS — I69391 Dysphagia following cerebral infarction: Secondary | ICD-10-CM | POA: Diagnosis not present

## 2017-11-29 DIAGNOSIS — R1310 Dysphagia, unspecified: Secondary | ICD-10-CM | POA: Diagnosis not present

## 2017-11-29 DIAGNOSIS — I69354 Hemiplegia and hemiparesis following cerebral infarction affecting left non-dominant side: Secondary | ICD-10-CM | POA: Diagnosis not present

## 2017-11-29 DIAGNOSIS — F209 Schizophrenia, unspecified: Secondary | ICD-10-CM | POA: Diagnosis not present

## 2017-11-29 DIAGNOSIS — I69320 Aphasia following cerebral infarction: Secondary | ICD-10-CM | POA: Diagnosis not present

## 2017-11-30 DIAGNOSIS — J449 Chronic obstructive pulmonary disease, unspecified: Secondary | ICD-10-CM | POA: Diagnosis not present

## 2017-11-30 DIAGNOSIS — F419 Anxiety disorder, unspecified: Secondary | ICD-10-CM | POA: Diagnosis not present

## 2017-11-30 DIAGNOSIS — L89322 Pressure ulcer of left buttock, stage 2: Secondary | ICD-10-CM | POA: Diagnosis not present

## 2017-11-30 DIAGNOSIS — Z9981 Dependence on supplemental oxygen: Secondary | ICD-10-CM | POA: Diagnosis not present

## 2017-11-30 DIAGNOSIS — Z7401 Bed confinement status: Secondary | ICD-10-CM | POA: Diagnosis not present

## 2017-11-30 DIAGNOSIS — K219 Gastro-esophageal reflux disease without esophagitis: Secondary | ICD-10-CM | POA: Diagnosis not present

## 2017-11-30 DIAGNOSIS — I69354 Hemiplegia and hemiparesis following cerebral infarction affecting left non-dominant side: Secondary | ICD-10-CM | POA: Diagnosis not present

## 2017-11-30 DIAGNOSIS — M24542 Contracture, left hand: Secondary | ICD-10-CM | POA: Diagnosis not present

## 2017-11-30 DIAGNOSIS — I69320 Aphasia following cerebral infarction: Secondary | ICD-10-CM | POA: Diagnosis not present

## 2017-11-30 DIAGNOSIS — M810 Age-related osteoporosis without current pathological fracture: Secondary | ICD-10-CM | POA: Diagnosis not present

## 2017-11-30 DIAGNOSIS — G8929 Other chronic pain: Secondary | ICD-10-CM | POA: Diagnosis not present

## 2017-11-30 DIAGNOSIS — Z431 Encounter for attention to gastrostomy: Secondary | ICD-10-CM | POA: Diagnosis not present

## 2017-11-30 DIAGNOSIS — F209 Schizophrenia, unspecified: Secondary | ICD-10-CM | POA: Diagnosis not present

## 2017-11-30 DIAGNOSIS — R1310 Dysphagia, unspecified: Secondary | ICD-10-CM | POA: Diagnosis not present

## 2017-11-30 DIAGNOSIS — F329 Major depressive disorder, single episode, unspecified: Secondary | ICD-10-CM | POA: Diagnosis not present

## 2017-11-30 DIAGNOSIS — R630 Anorexia: Secondary | ICD-10-CM | POA: Diagnosis not present

## 2017-11-30 DIAGNOSIS — I69391 Dysphagia following cerebral infarction: Secondary | ICD-10-CM | POA: Diagnosis not present

## 2017-11-30 DIAGNOSIS — L89620 Pressure ulcer of left heel, unstageable: Secondary | ICD-10-CM | POA: Diagnosis not present

## 2017-12-02 DIAGNOSIS — I69391 Dysphagia following cerebral infarction: Secondary | ICD-10-CM | POA: Diagnosis not present

## 2017-12-02 DIAGNOSIS — R1310 Dysphagia, unspecified: Secondary | ICD-10-CM | POA: Diagnosis not present

## 2017-12-02 DIAGNOSIS — I69354 Hemiplegia and hemiparesis following cerebral infarction affecting left non-dominant side: Secondary | ICD-10-CM | POA: Diagnosis not present

## 2017-12-02 DIAGNOSIS — I69320 Aphasia following cerebral infarction: Secondary | ICD-10-CM | POA: Diagnosis not present

## 2017-12-02 DIAGNOSIS — F209 Schizophrenia, unspecified: Secondary | ICD-10-CM | POA: Diagnosis not present

## 2017-12-02 DIAGNOSIS — F329 Major depressive disorder, single episode, unspecified: Secondary | ICD-10-CM | POA: Diagnosis not present

## 2017-12-03 DIAGNOSIS — I69391 Dysphagia following cerebral infarction: Secondary | ICD-10-CM | POA: Diagnosis not present

## 2017-12-03 DIAGNOSIS — I69354 Hemiplegia and hemiparesis following cerebral infarction affecting left non-dominant side: Secondary | ICD-10-CM | POA: Diagnosis not present

## 2017-12-03 DIAGNOSIS — R1310 Dysphagia, unspecified: Secondary | ICD-10-CM | POA: Diagnosis not present

## 2017-12-03 DIAGNOSIS — I69320 Aphasia following cerebral infarction: Secondary | ICD-10-CM | POA: Diagnosis not present

## 2017-12-03 DIAGNOSIS — F209 Schizophrenia, unspecified: Secondary | ICD-10-CM | POA: Diagnosis not present

## 2017-12-03 DIAGNOSIS — F329 Major depressive disorder, single episode, unspecified: Secondary | ICD-10-CM | POA: Diagnosis not present

## 2017-12-04 DIAGNOSIS — I69320 Aphasia following cerebral infarction: Secondary | ICD-10-CM | POA: Diagnosis not present

## 2017-12-04 DIAGNOSIS — R1310 Dysphagia, unspecified: Secondary | ICD-10-CM | POA: Diagnosis not present

## 2017-12-04 DIAGNOSIS — I69354 Hemiplegia and hemiparesis following cerebral infarction affecting left non-dominant side: Secondary | ICD-10-CM | POA: Diagnosis not present

## 2017-12-04 DIAGNOSIS — I69391 Dysphagia following cerebral infarction: Secondary | ICD-10-CM | POA: Diagnosis not present

## 2017-12-04 DIAGNOSIS — F209 Schizophrenia, unspecified: Secondary | ICD-10-CM | POA: Diagnosis not present

## 2017-12-04 DIAGNOSIS — F329 Major depressive disorder, single episode, unspecified: Secondary | ICD-10-CM | POA: Diagnosis not present

## 2017-12-05 DIAGNOSIS — I69391 Dysphagia following cerebral infarction: Secondary | ICD-10-CM | POA: Diagnosis not present

## 2017-12-05 DIAGNOSIS — R1310 Dysphagia, unspecified: Secondary | ICD-10-CM | POA: Diagnosis not present

## 2017-12-05 DIAGNOSIS — F329 Major depressive disorder, single episode, unspecified: Secondary | ICD-10-CM | POA: Diagnosis not present

## 2017-12-05 DIAGNOSIS — I69354 Hemiplegia and hemiparesis following cerebral infarction affecting left non-dominant side: Secondary | ICD-10-CM | POA: Diagnosis not present

## 2017-12-05 DIAGNOSIS — F209 Schizophrenia, unspecified: Secondary | ICD-10-CM | POA: Diagnosis not present

## 2017-12-05 DIAGNOSIS — I69320 Aphasia following cerebral infarction: Secondary | ICD-10-CM | POA: Diagnosis not present

## 2017-12-06 DIAGNOSIS — F209 Schizophrenia, unspecified: Secondary | ICD-10-CM | POA: Diagnosis not present

## 2017-12-06 DIAGNOSIS — I69391 Dysphagia following cerebral infarction: Secondary | ICD-10-CM | POA: Diagnosis not present

## 2017-12-06 DIAGNOSIS — I69320 Aphasia following cerebral infarction: Secondary | ICD-10-CM | POA: Diagnosis not present

## 2017-12-06 DIAGNOSIS — F329 Major depressive disorder, single episode, unspecified: Secondary | ICD-10-CM | POA: Diagnosis not present

## 2017-12-06 DIAGNOSIS — I69354 Hemiplegia and hemiparesis following cerebral infarction affecting left non-dominant side: Secondary | ICD-10-CM | POA: Diagnosis not present

## 2017-12-06 DIAGNOSIS — R1310 Dysphagia, unspecified: Secondary | ICD-10-CM | POA: Diagnosis not present

## 2017-12-09 DIAGNOSIS — F329 Major depressive disorder, single episode, unspecified: Secondary | ICD-10-CM | POA: Diagnosis not present

## 2017-12-09 DIAGNOSIS — I69391 Dysphagia following cerebral infarction: Secondary | ICD-10-CM | POA: Diagnosis not present

## 2017-12-09 DIAGNOSIS — I69354 Hemiplegia and hemiparesis following cerebral infarction affecting left non-dominant side: Secondary | ICD-10-CM | POA: Diagnosis not present

## 2017-12-09 DIAGNOSIS — R1310 Dysphagia, unspecified: Secondary | ICD-10-CM | POA: Diagnosis not present

## 2017-12-09 DIAGNOSIS — I69320 Aphasia following cerebral infarction: Secondary | ICD-10-CM | POA: Diagnosis not present

## 2017-12-09 DIAGNOSIS — F209 Schizophrenia, unspecified: Secondary | ICD-10-CM | POA: Diagnosis not present

## 2017-12-10 DIAGNOSIS — I69320 Aphasia following cerebral infarction: Secondary | ICD-10-CM | POA: Diagnosis not present

## 2017-12-10 DIAGNOSIS — F329 Major depressive disorder, single episode, unspecified: Secondary | ICD-10-CM | POA: Diagnosis not present

## 2017-12-10 DIAGNOSIS — F209 Schizophrenia, unspecified: Secondary | ICD-10-CM | POA: Diagnosis not present

## 2017-12-10 DIAGNOSIS — I69391 Dysphagia following cerebral infarction: Secondary | ICD-10-CM | POA: Diagnosis not present

## 2017-12-10 DIAGNOSIS — R1310 Dysphagia, unspecified: Secondary | ICD-10-CM | POA: Diagnosis not present

## 2017-12-10 DIAGNOSIS — I69354 Hemiplegia and hemiparesis following cerebral infarction affecting left non-dominant side: Secondary | ICD-10-CM | POA: Diagnosis not present

## 2017-12-11 DIAGNOSIS — I69320 Aphasia following cerebral infarction: Secondary | ICD-10-CM | POA: Diagnosis not present

## 2017-12-11 DIAGNOSIS — I69391 Dysphagia following cerebral infarction: Secondary | ICD-10-CM | POA: Diagnosis not present

## 2017-12-11 DIAGNOSIS — I69354 Hemiplegia and hemiparesis following cerebral infarction affecting left non-dominant side: Secondary | ICD-10-CM | POA: Diagnosis not present

## 2017-12-11 DIAGNOSIS — F329 Major depressive disorder, single episode, unspecified: Secondary | ICD-10-CM | POA: Diagnosis not present

## 2017-12-11 DIAGNOSIS — F209 Schizophrenia, unspecified: Secondary | ICD-10-CM | POA: Diagnosis not present

## 2017-12-11 DIAGNOSIS — R1310 Dysphagia, unspecified: Secondary | ICD-10-CM | POA: Diagnosis not present

## 2017-12-12 DIAGNOSIS — F209 Schizophrenia, unspecified: Secondary | ICD-10-CM | POA: Diagnosis not present

## 2017-12-12 DIAGNOSIS — I69354 Hemiplegia and hemiparesis following cerebral infarction affecting left non-dominant side: Secondary | ICD-10-CM | POA: Diagnosis not present

## 2017-12-12 DIAGNOSIS — I69391 Dysphagia following cerebral infarction: Secondary | ICD-10-CM | POA: Diagnosis not present

## 2017-12-12 DIAGNOSIS — F329 Major depressive disorder, single episode, unspecified: Secondary | ICD-10-CM | POA: Diagnosis not present

## 2017-12-12 DIAGNOSIS — R1310 Dysphagia, unspecified: Secondary | ICD-10-CM | POA: Diagnosis not present

## 2017-12-12 DIAGNOSIS — I69320 Aphasia following cerebral infarction: Secondary | ICD-10-CM | POA: Diagnosis not present

## 2017-12-13 DIAGNOSIS — F329 Major depressive disorder, single episode, unspecified: Secondary | ICD-10-CM | POA: Diagnosis not present

## 2017-12-13 DIAGNOSIS — F209 Schizophrenia, unspecified: Secondary | ICD-10-CM | POA: Diagnosis not present

## 2017-12-13 DIAGNOSIS — I69391 Dysphagia following cerebral infarction: Secondary | ICD-10-CM | POA: Diagnosis not present

## 2017-12-13 DIAGNOSIS — I69354 Hemiplegia and hemiparesis following cerebral infarction affecting left non-dominant side: Secondary | ICD-10-CM | POA: Diagnosis not present

## 2017-12-13 DIAGNOSIS — I69320 Aphasia following cerebral infarction: Secondary | ICD-10-CM | POA: Diagnosis not present

## 2017-12-13 DIAGNOSIS — R1310 Dysphagia, unspecified: Secondary | ICD-10-CM | POA: Diagnosis not present

## 2017-12-16 DIAGNOSIS — R1310 Dysphagia, unspecified: Secondary | ICD-10-CM | POA: Diagnosis not present

## 2017-12-16 DIAGNOSIS — F209 Schizophrenia, unspecified: Secondary | ICD-10-CM | POA: Diagnosis not present

## 2017-12-16 DIAGNOSIS — F329 Major depressive disorder, single episode, unspecified: Secondary | ICD-10-CM | POA: Diagnosis not present

## 2017-12-16 DIAGNOSIS — I69320 Aphasia following cerebral infarction: Secondary | ICD-10-CM | POA: Diagnosis not present

## 2017-12-16 DIAGNOSIS — I69391 Dysphagia following cerebral infarction: Secondary | ICD-10-CM | POA: Diagnosis not present

## 2017-12-16 DIAGNOSIS — I69354 Hemiplegia and hemiparesis following cerebral infarction affecting left non-dominant side: Secondary | ICD-10-CM | POA: Diagnosis not present

## 2017-12-17 DIAGNOSIS — I69320 Aphasia following cerebral infarction: Secondary | ICD-10-CM | POA: Diagnosis not present

## 2017-12-17 DIAGNOSIS — R1310 Dysphagia, unspecified: Secondary | ICD-10-CM | POA: Diagnosis not present

## 2017-12-17 DIAGNOSIS — I69354 Hemiplegia and hemiparesis following cerebral infarction affecting left non-dominant side: Secondary | ICD-10-CM | POA: Diagnosis not present

## 2017-12-17 DIAGNOSIS — I69391 Dysphagia following cerebral infarction: Secondary | ICD-10-CM | POA: Diagnosis not present

## 2017-12-17 DIAGNOSIS — F329 Major depressive disorder, single episode, unspecified: Secondary | ICD-10-CM | POA: Diagnosis not present

## 2017-12-17 DIAGNOSIS — F209 Schizophrenia, unspecified: Secondary | ICD-10-CM | POA: Diagnosis not present

## 2017-12-18 DIAGNOSIS — R1310 Dysphagia, unspecified: Secondary | ICD-10-CM | POA: Diagnosis not present

## 2017-12-18 DIAGNOSIS — I69320 Aphasia following cerebral infarction: Secondary | ICD-10-CM | POA: Diagnosis not present

## 2017-12-18 DIAGNOSIS — F209 Schizophrenia, unspecified: Secondary | ICD-10-CM | POA: Diagnosis not present

## 2017-12-18 DIAGNOSIS — I69391 Dysphagia following cerebral infarction: Secondary | ICD-10-CM | POA: Diagnosis not present

## 2017-12-18 DIAGNOSIS — F329 Major depressive disorder, single episode, unspecified: Secondary | ICD-10-CM | POA: Diagnosis not present

## 2017-12-18 DIAGNOSIS — I69354 Hemiplegia and hemiparesis following cerebral infarction affecting left non-dominant side: Secondary | ICD-10-CM | POA: Diagnosis not present

## 2017-12-19 DIAGNOSIS — I69391 Dysphagia following cerebral infarction: Secondary | ICD-10-CM | POA: Diagnosis not present

## 2017-12-19 DIAGNOSIS — F329 Major depressive disorder, single episode, unspecified: Secondary | ICD-10-CM | POA: Diagnosis not present

## 2017-12-19 DIAGNOSIS — I69354 Hemiplegia and hemiparesis following cerebral infarction affecting left non-dominant side: Secondary | ICD-10-CM | POA: Diagnosis not present

## 2017-12-19 DIAGNOSIS — R1310 Dysphagia, unspecified: Secondary | ICD-10-CM | POA: Diagnosis not present

## 2017-12-19 DIAGNOSIS — I69320 Aphasia following cerebral infarction: Secondary | ICD-10-CM | POA: Diagnosis not present

## 2017-12-19 DIAGNOSIS — F209 Schizophrenia, unspecified: Secondary | ICD-10-CM | POA: Diagnosis not present

## 2017-12-20 DIAGNOSIS — F209 Schizophrenia, unspecified: Secondary | ICD-10-CM | POA: Diagnosis not present

## 2017-12-20 DIAGNOSIS — F329 Major depressive disorder, single episode, unspecified: Secondary | ICD-10-CM | POA: Diagnosis not present

## 2017-12-20 DIAGNOSIS — I69391 Dysphagia following cerebral infarction: Secondary | ICD-10-CM | POA: Diagnosis not present

## 2017-12-20 DIAGNOSIS — I69320 Aphasia following cerebral infarction: Secondary | ICD-10-CM | POA: Diagnosis not present

## 2017-12-20 DIAGNOSIS — I69354 Hemiplegia and hemiparesis following cerebral infarction affecting left non-dominant side: Secondary | ICD-10-CM | POA: Diagnosis not present

## 2017-12-20 DIAGNOSIS — R1310 Dysphagia, unspecified: Secondary | ICD-10-CM | POA: Diagnosis not present

## 2017-12-23 DIAGNOSIS — I69391 Dysphagia following cerebral infarction: Secondary | ICD-10-CM | POA: Diagnosis not present

## 2017-12-23 DIAGNOSIS — I69354 Hemiplegia and hemiparesis following cerebral infarction affecting left non-dominant side: Secondary | ICD-10-CM | POA: Diagnosis not present

## 2017-12-23 DIAGNOSIS — R1310 Dysphagia, unspecified: Secondary | ICD-10-CM | POA: Diagnosis not present

## 2017-12-23 DIAGNOSIS — I69320 Aphasia following cerebral infarction: Secondary | ICD-10-CM | POA: Diagnosis not present

## 2017-12-23 DIAGNOSIS — F209 Schizophrenia, unspecified: Secondary | ICD-10-CM | POA: Diagnosis not present

## 2017-12-23 DIAGNOSIS — F329 Major depressive disorder, single episode, unspecified: Secondary | ICD-10-CM | POA: Diagnosis not present

## 2017-12-24 DIAGNOSIS — F209 Schizophrenia, unspecified: Secondary | ICD-10-CM | POA: Diagnosis not present

## 2017-12-24 DIAGNOSIS — I69320 Aphasia following cerebral infarction: Secondary | ICD-10-CM | POA: Diagnosis not present

## 2017-12-24 DIAGNOSIS — F329 Major depressive disorder, single episode, unspecified: Secondary | ICD-10-CM | POA: Diagnosis not present

## 2017-12-24 DIAGNOSIS — R1310 Dysphagia, unspecified: Secondary | ICD-10-CM | POA: Diagnosis not present

## 2017-12-24 DIAGNOSIS — I69354 Hemiplegia and hemiparesis following cerebral infarction affecting left non-dominant side: Secondary | ICD-10-CM | POA: Diagnosis not present

## 2017-12-24 DIAGNOSIS — I69391 Dysphagia following cerebral infarction: Secondary | ICD-10-CM | POA: Diagnosis not present

## 2017-12-25 DIAGNOSIS — R1310 Dysphagia, unspecified: Secondary | ICD-10-CM | POA: Diagnosis not present

## 2017-12-25 DIAGNOSIS — I69354 Hemiplegia and hemiparesis following cerebral infarction affecting left non-dominant side: Secondary | ICD-10-CM | POA: Diagnosis not present

## 2017-12-25 DIAGNOSIS — I69320 Aphasia following cerebral infarction: Secondary | ICD-10-CM | POA: Diagnosis not present

## 2017-12-25 DIAGNOSIS — F209 Schizophrenia, unspecified: Secondary | ICD-10-CM | POA: Diagnosis not present

## 2017-12-25 DIAGNOSIS — F329 Major depressive disorder, single episode, unspecified: Secondary | ICD-10-CM | POA: Diagnosis not present

## 2017-12-25 DIAGNOSIS — I69391 Dysphagia following cerebral infarction: Secondary | ICD-10-CM | POA: Diagnosis not present

## 2017-12-26 DIAGNOSIS — F209 Schizophrenia, unspecified: Secondary | ICD-10-CM | POA: Diagnosis not present

## 2017-12-26 DIAGNOSIS — I69391 Dysphagia following cerebral infarction: Secondary | ICD-10-CM | POA: Diagnosis not present

## 2017-12-26 DIAGNOSIS — F329 Major depressive disorder, single episode, unspecified: Secondary | ICD-10-CM | POA: Diagnosis not present

## 2017-12-26 DIAGNOSIS — I69354 Hemiplegia and hemiparesis following cerebral infarction affecting left non-dominant side: Secondary | ICD-10-CM | POA: Diagnosis not present

## 2017-12-26 DIAGNOSIS — I69320 Aphasia following cerebral infarction: Secondary | ICD-10-CM | POA: Diagnosis not present

## 2017-12-26 DIAGNOSIS — R1310 Dysphagia, unspecified: Secondary | ICD-10-CM | POA: Diagnosis not present

## 2017-12-27 DIAGNOSIS — I69354 Hemiplegia and hemiparesis following cerebral infarction affecting left non-dominant side: Secondary | ICD-10-CM | POA: Diagnosis not present

## 2017-12-27 DIAGNOSIS — R1310 Dysphagia, unspecified: Secondary | ICD-10-CM | POA: Diagnosis not present

## 2017-12-27 DIAGNOSIS — I69320 Aphasia following cerebral infarction: Secondary | ICD-10-CM | POA: Diagnosis not present

## 2017-12-27 DIAGNOSIS — F329 Major depressive disorder, single episode, unspecified: Secondary | ICD-10-CM | POA: Diagnosis not present

## 2017-12-27 DIAGNOSIS — F209 Schizophrenia, unspecified: Secondary | ICD-10-CM | POA: Diagnosis not present

## 2017-12-27 DIAGNOSIS — I69391 Dysphagia following cerebral infarction: Secondary | ICD-10-CM | POA: Diagnosis not present

## 2017-12-30 DIAGNOSIS — R630 Anorexia: Secondary | ICD-10-CM | POA: Diagnosis not present

## 2017-12-30 DIAGNOSIS — F329 Major depressive disorder, single episode, unspecified: Secondary | ICD-10-CM | POA: Diagnosis not present

## 2017-12-30 DIAGNOSIS — F419 Anxiety disorder, unspecified: Secondary | ICD-10-CM | POA: Diagnosis not present

## 2017-12-30 DIAGNOSIS — J449 Chronic obstructive pulmonary disease, unspecified: Secondary | ICD-10-CM | POA: Diagnosis not present

## 2017-12-30 DIAGNOSIS — K219 Gastro-esophageal reflux disease without esophagitis: Secondary | ICD-10-CM | POA: Diagnosis not present

## 2017-12-30 DIAGNOSIS — Z431 Encounter for attention to gastrostomy: Secondary | ICD-10-CM | POA: Diagnosis not present

## 2017-12-30 DIAGNOSIS — L89620 Pressure ulcer of left heel, unstageable: Secondary | ICD-10-CM | POA: Diagnosis not present

## 2017-12-30 DIAGNOSIS — L89322 Pressure ulcer of left buttock, stage 2: Secondary | ICD-10-CM | POA: Diagnosis not present

## 2017-12-30 DIAGNOSIS — Z9981 Dependence on supplemental oxygen: Secondary | ICD-10-CM | POA: Diagnosis not present

## 2017-12-30 DIAGNOSIS — G8929 Other chronic pain: Secondary | ICD-10-CM | POA: Diagnosis not present

## 2017-12-30 DIAGNOSIS — I69391 Dysphagia following cerebral infarction: Secondary | ICD-10-CM | POA: Diagnosis not present

## 2017-12-30 DIAGNOSIS — I69320 Aphasia following cerebral infarction: Secondary | ICD-10-CM | POA: Diagnosis not present

## 2017-12-30 DIAGNOSIS — Z7401 Bed confinement status: Secondary | ICD-10-CM | POA: Diagnosis not present

## 2017-12-30 DIAGNOSIS — M24542 Contracture, left hand: Secondary | ICD-10-CM | POA: Diagnosis not present

## 2017-12-30 DIAGNOSIS — I69354 Hemiplegia and hemiparesis following cerebral infarction affecting left non-dominant side: Secondary | ICD-10-CM | POA: Diagnosis not present

## 2017-12-30 DIAGNOSIS — F209 Schizophrenia, unspecified: Secondary | ICD-10-CM | POA: Diagnosis not present

## 2017-12-30 DIAGNOSIS — M810 Age-related osteoporosis without current pathological fracture: Secondary | ICD-10-CM | POA: Diagnosis not present

## 2017-12-30 DIAGNOSIS — R1310 Dysphagia, unspecified: Secondary | ICD-10-CM | POA: Diagnosis not present

## 2017-12-31 DIAGNOSIS — I69354 Hemiplegia and hemiparesis following cerebral infarction affecting left non-dominant side: Secondary | ICD-10-CM | POA: Diagnosis not present

## 2017-12-31 DIAGNOSIS — I69391 Dysphagia following cerebral infarction: Secondary | ICD-10-CM | POA: Diagnosis not present

## 2017-12-31 DIAGNOSIS — F209 Schizophrenia, unspecified: Secondary | ICD-10-CM | POA: Diagnosis not present

## 2017-12-31 DIAGNOSIS — I69320 Aphasia following cerebral infarction: Secondary | ICD-10-CM | POA: Diagnosis not present

## 2017-12-31 DIAGNOSIS — R1310 Dysphagia, unspecified: Secondary | ICD-10-CM | POA: Diagnosis not present

## 2017-12-31 DIAGNOSIS — F329 Major depressive disorder, single episode, unspecified: Secondary | ICD-10-CM | POA: Diagnosis not present

## 2018-01-01 DIAGNOSIS — I69320 Aphasia following cerebral infarction: Secondary | ICD-10-CM | POA: Diagnosis not present

## 2018-01-01 DIAGNOSIS — F329 Major depressive disorder, single episode, unspecified: Secondary | ICD-10-CM | POA: Diagnosis not present

## 2018-01-01 DIAGNOSIS — I69354 Hemiplegia and hemiparesis following cerebral infarction affecting left non-dominant side: Secondary | ICD-10-CM | POA: Diagnosis not present

## 2018-01-01 DIAGNOSIS — I69391 Dysphagia following cerebral infarction: Secondary | ICD-10-CM | POA: Diagnosis not present

## 2018-01-01 DIAGNOSIS — R1310 Dysphagia, unspecified: Secondary | ICD-10-CM | POA: Diagnosis not present

## 2018-01-01 DIAGNOSIS — F209 Schizophrenia, unspecified: Secondary | ICD-10-CM | POA: Diagnosis not present

## 2018-01-02 DIAGNOSIS — I69354 Hemiplegia and hemiparesis following cerebral infarction affecting left non-dominant side: Secondary | ICD-10-CM | POA: Diagnosis not present

## 2018-01-02 DIAGNOSIS — R1310 Dysphagia, unspecified: Secondary | ICD-10-CM | POA: Diagnosis not present

## 2018-01-02 DIAGNOSIS — I69320 Aphasia following cerebral infarction: Secondary | ICD-10-CM | POA: Diagnosis not present

## 2018-01-02 DIAGNOSIS — F329 Major depressive disorder, single episode, unspecified: Secondary | ICD-10-CM | POA: Diagnosis not present

## 2018-01-02 DIAGNOSIS — I69391 Dysphagia following cerebral infarction: Secondary | ICD-10-CM | POA: Diagnosis not present

## 2018-01-02 DIAGNOSIS — F209 Schizophrenia, unspecified: Secondary | ICD-10-CM | POA: Diagnosis not present

## 2018-01-03 DIAGNOSIS — F209 Schizophrenia, unspecified: Secondary | ICD-10-CM | POA: Diagnosis not present

## 2018-01-03 DIAGNOSIS — R1310 Dysphagia, unspecified: Secondary | ICD-10-CM | POA: Diagnosis not present

## 2018-01-03 DIAGNOSIS — I69354 Hemiplegia and hemiparesis following cerebral infarction affecting left non-dominant side: Secondary | ICD-10-CM | POA: Diagnosis not present

## 2018-01-03 DIAGNOSIS — F329 Major depressive disorder, single episode, unspecified: Secondary | ICD-10-CM | POA: Diagnosis not present

## 2018-01-03 DIAGNOSIS — I69320 Aphasia following cerebral infarction: Secondary | ICD-10-CM | POA: Diagnosis not present

## 2018-01-03 DIAGNOSIS — I69391 Dysphagia following cerebral infarction: Secondary | ICD-10-CM | POA: Diagnosis not present

## 2018-01-06 DIAGNOSIS — I69320 Aphasia following cerebral infarction: Secondary | ICD-10-CM | POA: Diagnosis not present

## 2018-01-06 DIAGNOSIS — R1310 Dysphagia, unspecified: Secondary | ICD-10-CM | POA: Diagnosis not present

## 2018-01-06 DIAGNOSIS — F209 Schizophrenia, unspecified: Secondary | ICD-10-CM | POA: Diagnosis not present

## 2018-01-06 DIAGNOSIS — I69391 Dysphagia following cerebral infarction: Secondary | ICD-10-CM | POA: Diagnosis not present

## 2018-01-06 DIAGNOSIS — I69354 Hemiplegia and hemiparesis following cerebral infarction affecting left non-dominant side: Secondary | ICD-10-CM | POA: Diagnosis not present

## 2018-01-06 DIAGNOSIS — F329 Major depressive disorder, single episode, unspecified: Secondary | ICD-10-CM | POA: Diagnosis not present

## 2018-01-07 DIAGNOSIS — F329 Major depressive disorder, single episode, unspecified: Secondary | ICD-10-CM | POA: Diagnosis not present

## 2018-01-07 DIAGNOSIS — I69391 Dysphagia following cerebral infarction: Secondary | ICD-10-CM | POA: Diagnosis not present

## 2018-01-07 DIAGNOSIS — F209 Schizophrenia, unspecified: Secondary | ICD-10-CM | POA: Diagnosis not present

## 2018-01-07 DIAGNOSIS — R1310 Dysphagia, unspecified: Secondary | ICD-10-CM | POA: Diagnosis not present

## 2018-01-07 DIAGNOSIS — I69354 Hemiplegia and hemiparesis following cerebral infarction affecting left non-dominant side: Secondary | ICD-10-CM | POA: Diagnosis not present

## 2018-01-07 DIAGNOSIS — I69320 Aphasia following cerebral infarction: Secondary | ICD-10-CM | POA: Diagnosis not present

## 2018-01-08 DIAGNOSIS — F329 Major depressive disorder, single episode, unspecified: Secondary | ICD-10-CM | POA: Diagnosis not present

## 2018-01-08 DIAGNOSIS — R1310 Dysphagia, unspecified: Secondary | ICD-10-CM | POA: Diagnosis not present

## 2018-01-08 DIAGNOSIS — I69391 Dysphagia following cerebral infarction: Secondary | ICD-10-CM | POA: Diagnosis not present

## 2018-01-08 DIAGNOSIS — I69320 Aphasia following cerebral infarction: Secondary | ICD-10-CM | POA: Diagnosis not present

## 2018-01-08 DIAGNOSIS — F209 Schizophrenia, unspecified: Secondary | ICD-10-CM | POA: Diagnosis not present

## 2018-01-08 DIAGNOSIS — I69354 Hemiplegia and hemiparesis following cerebral infarction affecting left non-dominant side: Secondary | ICD-10-CM | POA: Diagnosis not present

## 2018-01-09 DIAGNOSIS — F209 Schizophrenia, unspecified: Secondary | ICD-10-CM | POA: Diagnosis not present

## 2018-01-09 DIAGNOSIS — I69320 Aphasia following cerebral infarction: Secondary | ICD-10-CM | POA: Diagnosis not present

## 2018-01-09 DIAGNOSIS — F329 Major depressive disorder, single episode, unspecified: Secondary | ICD-10-CM | POA: Diagnosis not present

## 2018-01-09 DIAGNOSIS — R1310 Dysphagia, unspecified: Secondary | ICD-10-CM | POA: Diagnosis not present

## 2018-01-09 DIAGNOSIS — I69354 Hemiplegia and hemiparesis following cerebral infarction affecting left non-dominant side: Secondary | ICD-10-CM | POA: Diagnosis not present

## 2018-01-09 DIAGNOSIS — I69391 Dysphagia following cerebral infarction: Secondary | ICD-10-CM | POA: Diagnosis not present

## 2018-01-10 DIAGNOSIS — F209 Schizophrenia, unspecified: Secondary | ICD-10-CM | POA: Diagnosis not present

## 2018-01-10 DIAGNOSIS — I69354 Hemiplegia and hemiparesis following cerebral infarction affecting left non-dominant side: Secondary | ICD-10-CM | POA: Diagnosis not present

## 2018-01-10 DIAGNOSIS — F329 Major depressive disorder, single episode, unspecified: Secondary | ICD-10-CM | POA: Diagnosis not present

## 2018-01-10 DIAGNOSIS — I69320 Aphasia following cerebral infarction: Secondary | ICD-10-CM | POA: Diagnosis not present

## 2018-01-10 DIAGNOSIS — I69391 Dysphagia following cerebral infarction: Secondary | ICD-10-CM | POA: Diagnosis not present

## 2018-01-10 DIAGNOSIS — R1310 Dysphagia, unspecified: Secondary | ICD-10-CM | POA: Diagnosis not present

## 2018-01-13 DIAGNOSIS — F209 Schizophrenia, unspecified: Secondary | ICD-10-CM | POA: Diagnosis not present

## 2018-01-13 DIAGNOSIS — I69354 Hemiplegia and hemiparesis following cerebral infarction affecting left non-dominant side: Secondary | ICD-10-CM | POA: Diagnosis not present

## 2018-01-13 DIAGNOSIS — I69320 Aphasia following cerebral infarction: Secondary | ICD-10-CM | POA: Diagnosis not present

## 2018-01-13 DIAGNOSIS — R1310 Dysphagia, unspecified: Secondary | ICD-10-CM | POA: Diagnosis not present

## 2018-01-13 DIAGNOSIS — F329 Major depressive disorder, single episode, unspecified: Secondary | ICD-10-CM | POA: Diagnosis not present

## 2018-01-13 DIAGNOSIS — I69391 Dysphagia following cerebral infarction: Secondary | ICD-10-CM | POA: Diagnosis not present

## 2018-01-14 DIAGNOSIS — I69391 Dysphagia following cerebral infarction: Secondary | ICD-10-CM | POA: Diagnosis not present

## 2018-01-14 DIAGNOSIS — F329 Major depressive disorder, single episode, unspecified: Secondary | ICD-10-CM | POA: Diagnosis not present

## 2018-01-14 DIAGNOSIS — R1310 Dysphagia, unspecified: Secondary | ICD-10-CM | POA: Diagnosis not present

## 2018-01-14 DIAGNOSIS — F209 Schizophrenia, unspecified: Secondary | ICD-10-CM | POA: Diagnosis not present

## 2018-01-14 DIAGNOSIS — I69354 Hemiplegia and hemiparesis following cerebral infarction affecting left non-dominant side: Secondary | ICD-10-CM | POA: Diagnosis not present

## 2018-01-14 DIAGNOSIS — I69320 Aphasia following cerebral infarction: Secondary | ICD-10-CM | POA: Diagnosis not present

## 2018-01-15 DIAGNOSIS — I69354 Hemiplegia and hemiparesis following cerebral infarction affecting left non-dominant side: Secondary | ICD-10-CM | POA: Diagnosis not present

## 2018-01-15 DIAGNOSIS — I69391 Dysphagia following cerebral infarction: Secondary | ICD-10-CM | POA: Diagnosis not present

## 2018-01-15 DIAGNOSIS — R1310 Dysphagia, unspecified: Secondary | ICD-10-CM | POA: Diagnosis not present

## 2018-01-15 DIAGNOSIS — F209 Schizophrenia, unspecified: Secondary | ICD-10-CM | POA: Diagnosis not present

## 2018-01-15 DIAGNOSIS — F329 Major depressive disorder, single episode, unspecified: Secondary | ICD-10-CM | POA: Diagnosis not present

## 2018-01-15 DIAGNOSIS — I69320 Aphasia following cerebral infarction: Secondary | ICD-10-CM | POA: Diagnosis not present

## 2018-01-16 DIAGNOSIS — F329 Major depressive disorder, single episode, unspecified: Secondary | ICD-10-CM | POA: Diagnosis not present

## 2018-01-16 DIAGNOSIS — R1310 Dysphagia, unspecified: Secondary | ICD-10-CM | POA: Diagnosis not present

## 2018-01-16 DIAGNOSIS — I69320 Aphasia following cerebral infarction: Secondary | ICD-10-CM | POA: Diagnosis not present

## 2018-01-16 DIAGNOSIS — I69391 Dysphagia following cerebral infarction: Secondary | ICD-10-CM | POA: Diagnosis not present

## 2018-01-16 DIAGNOSIS — I69354 Hemiplegia and hemiparesis following cerebral infarction affecting left non-dominant side: Secondary | ICD-10-CM | POA: Diagnosis not present

## 2018-01-16 DIAGNOSIS — F209 Schizophrenia, unspecified: Secondary | ICD-10-CM | POA: Diagnosis not present

## 2018-01-17 DIAGNOSIS — F209 Schizophrenia, unspecified: Secondary | ICD-10-CM | POA: Diagnosis not present

## 2018-01-17 DIAGNOSIS — I69391 Dysphagia following cerebral infarction: Secondary | ICD-10-CM | POA: Diagnosis not present

## 2018-01-17 DIAGNOSIS — F329 Major depressive disorder, single episode, unspecified: Secondary | ICD-10-CM | POA: Diagnosis not present

## 2018-01-17 DIAGNOSIS — R1310 Dysphagia, unspecified: Secondary | ICD-10-CM | POA: Diagnosis not present

## 2018-01-17 DIAGNOSIS — I69354 Hemiplegia and hemiparesis following cerebral infarction affecting left non-dominant side: Secondary | ICD-10-CM | POA: Diagnosis not present

## 2018-01-17 DIAGNOSIS — I69320 Aphasia following cerebral infarction: Secondary | ICD-10-CM | POA: Diagnosis not present

## 2018-01-20 DIAGNOSIS — F329 Major depressive disorder, single episode, unspecified: Secondary | ICD-10-CM | POA: Diagnosis not present

## 2018-01-20 DIAGNOSIS — I69391 Dysphagia following cerebral infarction: Secondary | ICD-10-CM | POA: Diagnosis not present

## 2018-01-20 DIAGNOSIS — I69320 Aphasia following cerebral infarction: Secondary | ICD-10-CM | POA: Diagnosis not present

## 2018-01-20 DIAGNOSIS — F209 Schizophrenia, unspecified: Secondary | ICD-10-CM | POA: Diagnosis not present

## 2018-01-20 DIAGNOSIS — I69354 Hemiplegia and hemiparesis following cerebral infarction affecting left non-dominant side: Secondary | ICD-10-CM | POA: Diagnosis not present

## 2018-01-20 DIAGNOSIS — R1310 Dysphagia, unspecified: Secondary | ICD-10-CM | POA: Diagnosis not present

## 2018-01-21 DIAGNOSIS — I69391 Dysphagia following cerebral infarction: Secondary | ICD-10-CM | POA: Diagnosis not present

## 2018-01-21 DIAGNOSIS — I69320 Aphasia following cerebral infarction: Secondary | ICD-10-CM | POA: Diagnosis not present

## 2018-01-21 DIAGNOSIS — F329 Major depressive disorder, single episode, unspecified: Secondary | ICD-10-CM | POA: Diagnosis not present

## 2018-01-21 DIAGNOSIS — I69354 Hemiplegia and hemiparesis following cerebral infarction affecting left non-dominant side: Secondary | ICD-10-CM | POA: Diagnosis not present

## 2018-01-21 DIAGNOSIS — R1310 Dysphagia, unspecified: Secondary | ICD-10-CM | POA: Diagnosis not present

## 2018-01-21 DIAGNOSIS — F209 Schizophrenia, unspecified: Secondary | ICD-10-CM | POA: Diagnosis not present

## 2018-01-22 DIAGNOSIS — R1310 Dysphagia, unspecified: Secondary | ICD-10-CM | POA: Diagnosis not present

## 2018-01-22 DIAGNOSIS — I69354 Hemiplegia and hemiparesis following cerebral infarction affecting left non-dominant side: Secondary | ICD-10-CM | POA: Diagnosis not present

## 2018-01-22 DIAGNOSIS — I69391 Dysphagia following cerebral infarction: Secondary | ICD-10-CM | POA: Diagnosis not present

## 2018-01-22 DIAGNOSIS — F209 Schizophrenia, unspecified: Secondary | ICD-10-CM | POA: Diagnosis not present

## 2018-01-22 DIAGNOSIS — F329 Major depressive disorder, single episode, unspecified: Secondary | ICD-10-CM | POA: Diagnosis not present

## 2018-01-22 DIAGNOSIS — I69320 Aphasia following cerebral infarction: Secondary | ICD-10-CM | POA: Diagnosis not present

## 2018-01-23 DIAGNOSIS — F329 Major depressive disorder, single episode, unspecified: Secondary | ICD-10-CM | POA: Diagnosis not present

## 2018-01-23 DIAGNOSIS — F209 Schizophrenia, unspecified: Secondary | ICD-10-CM | POA: Diagnosis not present

## 2018-01-23 DIAGNOSIS — R1310 Dysphagia, unspecified: Secondary | ICD-10-CM | POA: Diagnosis not present

## 2018-01-23 DIAGNOSIS — I69320 Aphasia following cerebral infarction: Secondary | ICD-10-CM | POA: Diagnosis not present

## 2018-01-23 DIAGNOSIS — I69391 Dysphagia following cerebral infarction: Secondary | ICD-10-CM | POA: Diagnosis not present

## 2018-01-23 DIAGNOSIS — I69354 Hemiplegia and hemiparesis following cerebral infarction affecting left non-dominant side: Secondary | ICD-10-CM | POA: Diagnosis not present

## 2018-01-24 DIAGNOSIS — F39 Unspecified mood [affective] disorder: Secondary | ICD-10-CM | POA: Diagnosis not present

## 2018-01-24 DIAGNOSIS — G40901 Epilepsy, unspecified, not intractable, with status epilepticus: Secondary | ICD-10-CM | POA: Diagnosis not present

## 2018-01-24 DIAGNOSIS — I69398 Other sequelae of cerebral infarction: Secondary | ICD-10-CM | POA: Diagnosis not present

## 2018-01-24 DIAGNOSIS — I69320 Aphasia following cerebral infarction: Secondary | ICD-10-CM | POA: Diagnosis not present

## 2018-01-24 DIAGNOSIS — R1310 Dysphagia, unspecified: Secondary | ICD-10-CM | POA: Diagnosis not present

## 2018-01-24 DIAGNOSIS — I69391 Dysphagia following cerebral infarction: Secondary | ICD-10-CM | POA: Diagnosis not present

## 2018-01-24 DIAGNOSIS — M4807 Spinal stenosis, lumbosacral region: Secondary | ICD-10-CM | POA: Diagnosis not present

## 2018-01-24 DIAGNOSIS — F329 Major depressive disorder, single episode, unspecified: Secondary | ICD-10-CM | POA: Diagnosis not present

## 2018-01-24 DIAGNOSIS — J449 Chronic obstructive pulmonary disease, unspecified: Secondary | ICD-10-CM | POA: Diagnosis not present

## 2018-01-24 DIAGNOSIS — I69354 Hemiplegia and hemiparesis following cerebral infarction affecting left non-dominant side: Secondary | ICD-10-CM | POA: Diagnosis not present

## 2018-01-24 DIAGNOSIS — F209 Schizophrenia, unspecified: Secondary | ICD-10-CM | POA: Diagnosis not present

## 2018-01-24 DIAGNOSIS — F200 Paranoid schizophrenia: Secondary | ICD-10-CM | POA: Diagnosis not present

## 2018-01-27 DIAGNOSIS — F329 Major depressive disorder, single episode, unspecified: Secondary | ICD-10-CM | POA: Diagnosis not present

## 2018-01-27 DIAGNOSIS — I69354 Hemiplegia and hemiparesis following cerebral infarction affecting left non-dominant side: Secondary | ICD-10-CM | POA: Diagnosis not present

## 2018-01-27 DIAGNOSIS — R1310 Dysphagia, unspecified: Secondary | ICD-10-CM | POA: Diagnosis not present

## 2018-01-27 DIAGNOSIS — F209 Schizophrenia, unspecified: Secondary | ICD-10-CM | POA: Diagnosis not present

## 2018-01-27 DIAGNOSIS — I69320 Aphasia following cerebral infarction: Secondary | ICD-10-CM | POA: Diagnosis not present

## 2018-01-27 DIAGNOSIS — I69391 Dysphagia following cerebral infarction: Secondary | ICD-10-CM | POA: Diagnosis not present

## 2018-01-28 DIAGNOSIS — I69391 Dysphagia following cerebral infarction: Secondary | ICD-10-CM | POA: Diagnosis not present

## 2018-01-28 DIAGNOSIS — R1310 Dysphagia, unspecified: Secondary | ICD-10-CM | POA: Diagnosis not present

## 2018-01-28 DIAGNOSIS — F329 Major depressive disorder, single episode, unspecified: Secondary | ICD-10-CM | POA: Diagnosis not present

## 2018-01-28 DIAGNOSIS — I69320 Aphasia following cerebral infarction: Secondary | ICD-10-CM | POA: Diagnosis not present

## 2018-01-28 DIAGNOSIS — I69354 Hemiplegia and hemiparesis following cerebral infarction affecting left non-dominant side: Secondary | ICD-10-CM | POA: Diagnosis not present

## 2018-01-28 DIAGNOSIS — F209 Schizophrenia, unspecified: Secondary | ICD-10-CM | POA: Diagnosis not present

## 2018-01-29 DIAGNOSIS — F209 Schizophrenia, unspecified: Secondary | ICD-10-CM | POA: Diagnosis not present

## 2018-01-29 DIAGNOSIS — I69354 Hemiplegia and hemiparesis following cerebral infarction affecting left non-dominant side: Secondary | ICD-10-CM | POA: Diagnosis not present

## 2018-01-29 DIAGNOSIS — I69320 Aphasia following cerebral infarction: Secondary | ICD-10-CM | POA: Diagnosis not present

## 2018-01-29 DIAGNOSIS — R1310 Dysphagia, unspecified: Secondary | ICD-10-CM | POA: Diagnosis not present

## 2018-01-29 DIAGNOSIS — I69391 Dysphagia following cerebral infarction: Secondary | ICD-10-CM | POA: Diagnosis not present

## 2018-01-29 DIAGNOSIS — F329 Major depressive disorder, single episode, unspecified: Secondary | ICD-10-CM | POA: Diagnosis not present

## 2018-01-30 DIAGNOSIS — R208 Other disturbances of skin sensation: Secondary | ICD-10-CM | POA: Diagnosis not present

## 2018-01-30 DIAGNOSIS — F419 Anxiety disorder, unspecified: Secondary | ICD-10-CM | POA: Diagnosis not present

## 2018-01-30 DIAGNOSIS — I69391 Dysphagia following cerebral infarction: Secondary | ICD-10-CM | POA: Diagnosis not present

## 2018-01-30 DIAGNOSIS — J449 Chronic obstructive pulmonary disease, unspecified: Secondary | ICD-10-CM | POA: Diagnosis not present

## 2018-01-30 DIAGNOSIS — I69320 Aphasia following cerebral infarction: Secondary | ICD-10-CM | POA: Diagnosis not present

## 2018-01-30 DIAGNOSIS — F329 Major depressive disorder, single episode, unspecified: Secondary | ICD-10-CM | POA: Diagnosis not present

## 2018-01-30 DIAGNOSIS — R1310 Dysphagia, unspecified: Secondary | ICD-10-CM | POA: Diagnosis not present

## 2018-01-30 DIAGNOSIS — F209 Schizophrenia, unspecified: Secondary | ICD-10-CM | POA: Diagnosis not present

## 2018-01-30 DIAGNOSIS — I69354 Hemiplegia and hemiparesis following cerebral infarction affecting left non-dominant side: Secondary | ICD-10-CM | POA: Diagnosis not present

## 2018-01-30 DIAGNOSIS — K219 Gastro-esophageal reflux disease without esophagitis: Secondary | ICD-10-CM | POA: Diagnosis not present

## 2018-01-30 DIAGNOSIS — R630 Anorexia: Secondary | ICD-10-CM | POA: Diagnosis not present

## 2018-01-30 DIAGNOSIS — M24542 Contracture, left hand: Secondary | ICD-10-CM | POA: Diagnosis not present

## 2018-01-30 DIAGNOSIS — M810 Age-related osteoporosis without current pathological fracture: Secondary | ICD-10-CM | POA: Diagnosis not present

## 2018-01-30 DIAGNOSIS — Z7401 Bed confinement status: Secondary | ICD-10-CM | POA: Diagnosis not present

## 2018-01-30 DIAGNOSIS — G8929 Other chronic pain: Secondary | ICD-10-CM | POA: Diagnosis not present

## 2018-01-30 DIAGNOSIS — Z431 Encounter for attention to gastrostomy: Secondary | ICD-10-CM | POA: Diagnosis not present

## 2018-01-31 DIAGNOSIS — F329 Major depressive disorder, single episode, unspecified: Secondary | ICD-10-CM | POA: Diagnosis not present

## 2018-01-31 DIAGNOSIS — F209 Schizophrenia, unspecified: Secondary | ICD-10-CM | POA: Diagnosis not present

## 2018-01-31 DIAGNOSIS — I69320 Aphasia following cerebral infarction: Secondary | ICD-10-CM | POA: Diagnosis not present

## 2018-01-31 DIAGNOSIS — R1310 Dysphagia, unspecified: Secondary | ICD-10-CM | POA: Diagnosis not present

## 2018-01-31 DIAGNOSIS — I69391 Dysphagia following cerebral infarction: Secondary | ICD-10-CM | POA: Diagnosis not present

## 2018-01-31 DIAGNOSIS — I69354 Hemiplegia and hemiparesis following cerebral infarction affecting left non-dominant side: Secondary | ICD-10-CM | POA: Diagnosis not present

## 2018-02-03 DIAGNOSIS — F329 Major depressive disorder, single episode, unspecified: Secondary | ICD-10-CM | POA: Diagnosis not present

## 2018-02-03 DIAGNOSIS — R1310 Dysphagia, unspecified: Secondary | ICD-10-CM | POA: Diagnosis not present

## 2018-02-03 DIAGNOSIS — I69391 Dysphagia following cerebral infarction: Secondary | ICD-10-CM | POA: Diagnosis not present

## 2018-02-03 DIAGNOSIS — F209 Schizophrenia, unspecified: Secondary | ICD-10-CM | POA: Diagnosis not present

## 2018-02-03 DIAGNOSIS — I69354 Hemiplegia and hemiparesis following cerebral infarction affecting left non-dominant side: Secondary | ICD-10-CM | POA: Diagnosis not present

## 2018-02-03 DIAGNOSIS — I69320 Aphasia following cerebral infarction: Secondary | ICD-10-CM | POA: Diagnosis not present

## 2018-02-04 DIAGNOSIS — F329 Major depressive disorder, single episode, unspecified: Secondary | ICD-10-CM | POA: Diagnosis not present

## 2018-02-04 DIAGNOSIS — I69320 Aphasia following cerebral infarction: Secondary | ICD-10-CM | POA: Diagnosis not present

## 2018-02-04 DIAGNOSIS — F209 Schizophrenia, unspecified: Secondary | ICD-10-CM | POA: Diagnosis not present

## 2018-02-04 DIAGNOSIS — I69354 Hemiplegia and hemiparesis following cerebral infarction affecting left non-dominant side: Secondary | ICD-10-CM | POA: Diagnosis not present

## 2018-02-04 DIAGNOSIS — I69391 Dysphagia following cerebral infarction: Secondary | ICD-10-CM | POA: Diagnosis not present

## 2018-02-04 DIAGNOSIS — R1310 Dysphagia, unspecified: Secondary | ICD-10-CM | POA: Diagnosis not present

## 2018-02-05 DIAGNOSIS — I69354 Hemiplegia and hemiparesis following cerebral infarction affecting left non-dominant side: Secondary | ICD-10-CM | POA: Diagnosis not present

## 2018-02-05 DIAGNOSIS — R1310 Dysphagia, unspecified: Secondary | ICD-10-CM | POA: Diagnosis not present

## 2018-02-05 DIAGNOSIS — F209 Schizophrenia, unspecified: Secondary | ICD-10-CM | POA: Diagnosis not present

## 2018-02-05 DIAGNOSIS — I69391 Dysphagia following cerebral infarction: Secondary | ICD-10-CM | POA: Diagnosis not present

## 2018-02-05 DIAGNOSIS — I69320 Aphasia following cerebral infarction: Secondary | ICD-10-CM | POA: Diagnosis not present

## 2018-02-05 DIAGNOSIS — F329 Major depressive disorder, single episode, unspecified: Secondary | ICD-10-CM | POA: Diagnosis not present

## 2018-02-06 DIAGNOSIS — I69391 Dysphagia following cerebral infarction: Secondary | ICD-10-CM | POA: Diagnosis not present

## 2018-02-06 DIAGNOSIS — F329 Major depressive disorder, single episode, unspecified: Secondary | ICD-10-CM | POA: Diagnosis not present

## 2018-02-06 DIAGNOSIS — I69354 Hemiplegia and hemiparesis following cerebral infarction affecting left non-dominant side: Secondary | ICD-10-CM | POA: Diagnosis not present

## 2018-02-06 DIAGNOSIS — R1310 Dysphagia, unspecified: Secondary | ICD-10-CM | POA: Diagnosis not present

## 2018-02-06 DIAGNOSIS — I69320 Aphasia following cerebral infarction: Secondary | ICD-10-CM | POA: Diagnosis not present

## 2018-02-06 DIAGNOSIS — F209 Schizophrenia, unspecified: Secondary | ICD-10-CM | POA: Diagnosis not present

## 2018-02-07 DIAGNOSIS — F329 Major depressive disorder, single episode, unspecified: Secondary | ICD-10-CM | POA: Diagnosis not present

## 2018-02-07 DIAGNOSIS — I69320 Aphasia following cerebral infarction: Secondary | ICD-10-CM | POA: Diagnosis not present

## 2018-02-07 DIAGNOSIS — F209 Schizophrenia, unspecified: Secondary | ICD-10-CM | POA: Diagnosis not present

## 2018-02-07 DIAGNOSIS — R1310 Dysphagia, unspecified: Secondary | ICD-10-CM | POA: Diagnosis not present

## 2018-02-07 DIAGNOSIS — I69391 Dysphagia following cerebral infarction: Secondary | ICD-10-CM | POA: Diagnosis not present

## 2018-02-07 DIAGNOSIS — I69354 Hemiplegia and hemiparesis following cerebral infarction affecting left non-dominant side: Secondary | ICD-10-CM | POA: Diagnosis not present

## 2018-02-10 DIAGNOSIS — F209 Schizophrenia, unspecified: Secondary | ICD-10-CM | POA: Diagnosis not present

## 2018-02-10 DIAGNOSIS — I69354 Hemiplegia and hemiparesis following cerebral infarction affecting left non-dominant side: Secondary | ICD-10-CM | POA: Diagnosis not present

## 2018-02-10 DIAGNOSIS — I69320 Aphasia following cerebral infarction: Secondary | ICD-10-CM | POA: Diagnosis not present

## 2018-02-10 DIAGNOSIS — R1310 Dysphagia, unspecified: Secondary | ICD-10-CM | POA: Diagnosis not present

## 2018-02-10 DIAGNOSIS — F329 Major depressive disorder, single episode, unspecified: Secondary | ICD-10-CM | POA: Diagnosis not present

## 2018-02-10 DIAGNOSIS — I69391 Dysphagia following cerebral infarction: Secondary | ICD-10-CM | POA: Diagnosis not present

## 2018-02-11 DIAGNOSIS — F329 Major depressive disorder, single episode, unspecified: Secondary | ICD-10-CM | POA: Diagnosis not present

## 2018-02-11 DIAGNOSIS — I69320 Aphasia following cerebral infarction: Secondary | ICD-10-CM | POA: Diagnosis not present

## 2018-02-11 DIAGNOSIS — I69354 Hemiplegia and hemiparesis following cerebral infarction affecting left non-dominant side: Secondary | ICD-10-CM | POA: Diagnosis not present

## 2018-02-11 DIAGNOSIS — R1310 Dysphagia, unspecified: Secondary | ICD-10-CM | POA: Diagnosis not present

## 2018-02-11 DIAGNOSIS — F209 Schizophrenia, unspecified: Secondary | ICD-10-CM | POA: Diagnosis not present

## 2018-02-11 DIAGNOSIS — I69391 Dysphagia following cerebral infarction: Secondary | ICD-10-CM | POA: Diagnosis not present

## 2018-02-12 DIAGNOSIS — I69391 Dysphagia following cerebral infarction: Secondary | ICD-10-CM | POA: Diagnosis not present

## 2018-02-12 DIAGNOSIS — R1310 Dysphagia, unspecified: Secondary | ICD-10-CM | POA: Diagnosis not present

## 2018-02-12 DIAGNOSIS — F329 Major depressive disorder, single episode, unspecified: Secondary | ICD-10-CM | POA: Diagnosis not present

## 2018-02-12 DIAGNOSIS — I69320 Aphasia following cerebral infarction: Secondary | ICD-10-CM | POA: Diagnosis not present

## 2018-02-12 DIAGNOSIS — I69354 Hemiplegia and hemiparesis following cerebral infarction affecting left non-dominant side: Secondary | ICD-10-CM | POA: Diagnosis not present

## 2018-02-12 DIAGNOSIS — F209 Schizophrenia, unspecified: Secondary | ICD-10-CM | POA: Diagnosis not present

## 2018-02-13 DIAGNOSIS — F209 Schizophrenia, unspecified: Secondary | ICD-10-CM | POA: Diagnosis not present

## 2018-02-13 DIAGNOSIS — I69320 Aphasia following cerebral infarction: Secondary | ICD-10-CM | POA: Diagnosis not present

## 2018-02-13 DIAGNOSIS — I69354 Hemiplegia and hemiparesis following cerebral infarction affecting left non-dominant side: Secondary | ICD-10-CM | POA: Diagnosis not present

## 2018-02-13 DIAGNOSIS — F329 Major depressive disorder, single episode, unspecified: Secondary | ICD-10-CM | POA: Diagnosis not present

## 2018-02-13 DIAGNOSIS — R1310 Dysphagia, unspecified: Secondary | ICD-10-CM | POA: Diagnosis not present

## 2018-02-13 DIAGNOSIS — I69391 Dysphagia following cerebral infarction: Secondary | ICD-10-CM | POA: Diagnosis not present

## 2018-02-14 DIAGNOSIS — R1310 Dysphagia, unspecified: Secondary | ICD-10-CM | POA: Diagnosis not present

## 2018-02-14 DIAGNOSIS — I69354 Hemiplegia and hemiparesis following cerebral infarction affecting left non-dominant side: Secondary | ICD-10-CM | POA: Diagnosis not present

## 2018-02-14 DIAGNOSIS — F329 Major depressive disorder, single episode, unspecified: Secondary | ICD-10-CM | POA: Diagnosis not present

## 2018-02-14 DIAGNOSIS — I69320 Aphasia following cerebral infarction: Secondary | ICD-10-CM | POA: Diagnosis not present

## 2018-02-14 DIAGNOSIS — F209 Schizophrenia, unspecified: Secondary | ICD-10-CM | POA: Diagnosis not present

## 2018-02-14 DIAGNOSIS — I69391 Dysphagia following cerebral infarction: Secondary | ICD-10-CM | POA: Diagnosis not present

## 2018-02-17 DIAGNOSIS — F329 Major depressive disorder, single episode, unspecified: Secondary | ICD-10-CM | POA: Diagnosis not present

## 2018-02-17 DIAGNOSIS — I69391 Dysphagia following cerebral infarction: Secondary | ICD-10-CM | POA: Diagnosis not present

## 2018-02-17 DIAGNOSIS — I69320 Aphasia following cerebral infarction: Secondary | ICD-10-CM | POA: Diagnosis not present

## 2018-02-17 DIAGNOSIS — I69354 Hemiplegia and hemiparesis following cerebral infarction affecting left non-dominant side: Secondary | ICD-10-CM | POA: Diagnosis not present

## 2018-02-17 DIAGNOSIS — R1310 Dysphagia, unspecified: Secondary | ICD-10-CM | POA: Diagnosis not present

## 2018-02-17 DIAGNOSIS — F209 Schizophrenia, unspecified: Secondary | ICD-10-CM | POA: Diagnosis not present

## 2018-02-18 DIAGNOSIS — I69354 Hemiplegia and hemiparesis following cerebral infarction affecting left non-dominant side: Secondary | ICD-10-CM | POA: Diagnosis not present

## 2018-02-18 DIAGNOSIS — I69391 Dysphagia following cerebral infarction: Secondary | ICD-10-CM | POA: Diagnosis not present

## 2018-02-18 DIAGNOSIS — R1310 Dysphagia, unspecified: Secondary | ICD-10-CM | POA: Diagnosis not present

## 2018-02-18 DIAGNOSIS — F209 Schizophrenia, unspecified: Secondary | ICD-10-CM | POA: Diagnosis not present

## 2018-02-18 DIAGNOSIS — I69320 Aphasia following cerebral infarction: Secondary | ICD-10-CM | POA: Diagnosis not present

## 2018-02-18 DIAGNOSIS — F329 Major depressive disorder, single episode, unspecified: Secondary | ICD-10-CM | POA: Diagnosis not present

## 2018-02-19 DIAGNOSIS — F329 Major depressive disorder, single episode, unspecified: Secondary | ICD-10-CM | POA: Diagnosis not present

## 2018-02-19 DIAGNOSIS — R1310 Dysphagia, unspecified: Secondary | ICD-10-CM | POA: Diagnosis not present

## 2018-02-19 DIAGNOSIS — I69391 Dysphagia following cerebral infarction: Secondary | ICD-10-CM | POA: Diagnosis not present

## 2018-02-19 DIAGNOSIS — I69320 Aphasia following cerebral infarction: Secondary | ICD-10-CM | POA: Diagnosis not present

## 2018-02-19 DIAGNOSIS — F209 Schizophrenia, unspecified: Secondary | ICD-10-CM | POA: Diagnosis not present

## 2018-02-19 DIAGNOSIS — I69354 Hemiplegia and hemiparesis following cerebral infarction affecting left non-dominant side: Secondary | ICD-10-CM | POA: Diagnosis not present

## 2018-02-20 DIAGNOSIS — I69391 Dysphagia following cerebral infarction: Secondary | ICD-10-CM | POA: Diagnosis not present

## 2018-02-20 DIAGNOSIS — I69320 Aphasia following cerebral infarction: Secondary | ICD-10-CM | POA: Diagnosis not present

## 2018-02-20 DIAGNOSIS — F329 Major depressive disorder, single episode, unspecified: Secondary | ICD-10-CM | POA: Diagnosis not present

## 2018-02-20 DIAGNOSIS — R1310 Dysphagia, unspecified: Secondary | ICD-10-CM | POA: Diagnosis not present

## 2018-02-20 DIAGNOSIS — F209 Schizophrenia, unspecified: Secondary | ICD-10-CM | POA: Diagnosis not present

## 2018-02-20 DIAGNOSIS — I69354 Hemiplegia and hemiparesis following cerebral infarction affecting left non-dominant side: Secondary | ICD-10-CM | POA: Diagnosis not present

## 2018-02-21 DIAGNOSIS — F329 Major depressive disorder, single episode, unspecified: Secondary | ICD-10-CM | POA: Diagnosis not present

## 2018-02-21 DIAGNOSIS — I69320 Aphasia following cerebral infarction: Secondary | ICD-10-CM | POA: Diagnosis not present

## 2018-02-21 DIAGNOSIS — R1310 Dysphagia, unspecified: Secondary | ICD-10-CM | POA: Diagnosis not present

## 2018-02-21 DIAGNOSIS — F209 Schizophrenia, unspecified: Secondary | ICD-10-CM | POA: Diagnosis not present

## 2018-02-21 DIAGNOSIS — I69354 Hemiplegia and hemiparesis following cerebral infarction affecting left non-dominant side: Secondary | ICD-10-CM | POA: Diagnosis not present

## 2018-02-21 DIAGNOSIS — I69391 Dysphagia following cerebral infarction: Secondary | ICD-10-CM | POA: Diagnosis not present

## 2018-02-24 DIAGNOSIS — I69354 Hemiplegia and hemiparesis following cerebral infarction affecting left non-dominant side: Secondary | ICD-10-CM | POA: Diagnosis not present

## 2018-02-24 DIAGNOSIS — R1310 Dysphagia, unspecified: Secondary | ICD-10-CM | POA: Diagnosis not present

## 2018-02-24 DIAGNOSIS — F209 Schizophrenia, unspecified: Secondary | ICD-10-CM | POA: Diagnosis not present

## 2018-02-24 DIAGNOSIS — F329 Major depressive disorder, single episode, unspecified: Secondary | ICD-10-CM | POA: Diagnosis not present

## 2018-02-24 DIAGNOSIS — I69320 Aphasia following cerebral infarction: Secondary | ICD-10-CM | POA: Diagnosis not present

## 2018-02-24 DIAGNOSIS — I69391 Dysphagia following cerebral infarction: Secondary | ICD-10-CM | POA: Diagnosis not present

## 2018-02-24 IMAGING — DX DG CHEST 1V
1 series · 1 of 1 positions shown · non-contrast
Comparison: 03/06/2017

CLINICAL DATA: Hypoxia.  COPD.  Smoker.

EXAM:
CHEST 1 VIEW

[chest ap]
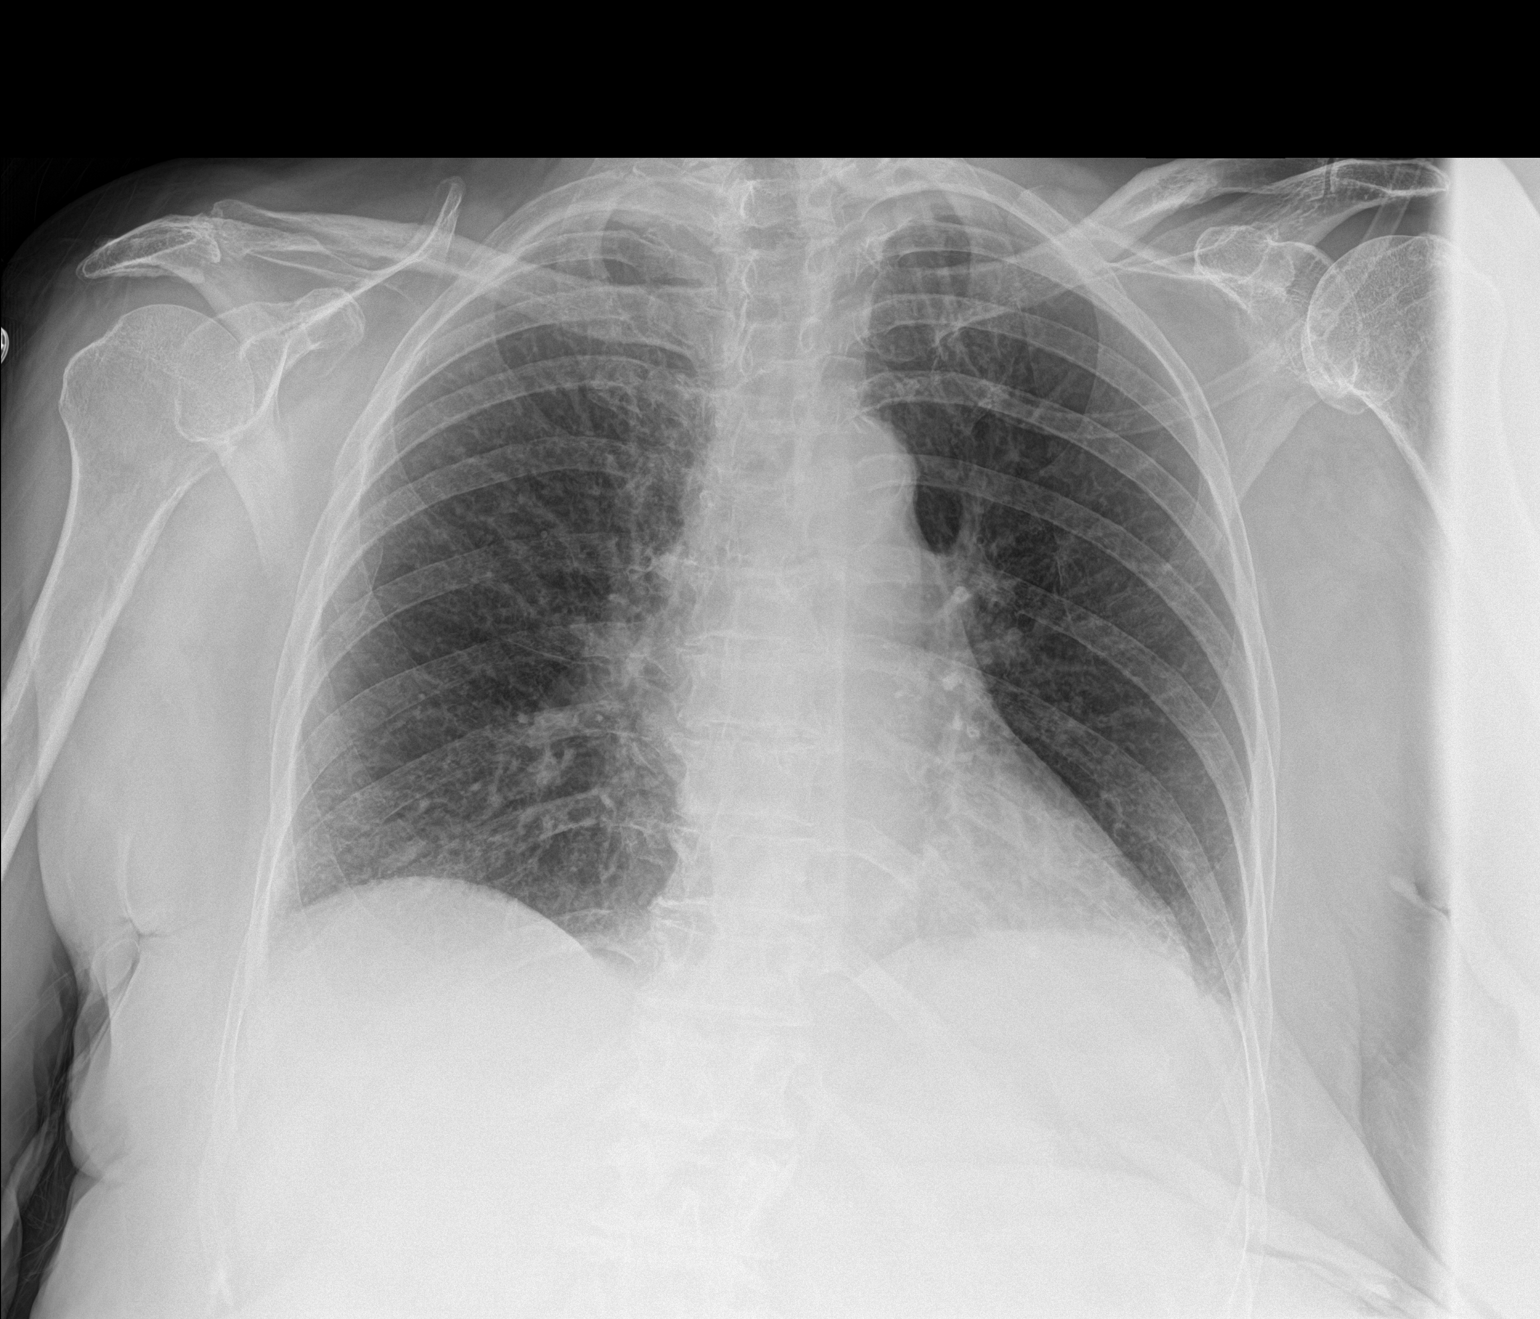

[1 of 1 positions shown; findings below may reference images not displayed]

FINDINGS: The heart size and mediastinal contours are within normal limits.
Aortic atherosclerosis. Both lungs are clear. The visualized
skeletal structures are unremarkable.
IMPRESSION: No active disease.

## 2018-02-25 DIAGNOSIS — I69354 Hemiplegia and hemiparesis following cerebral infarction affecting left non-dominant side: Secondary | ICD-10-CM | POA: Diagnosis not present

## 2018-02-25 DIAGNOSIS — R1310 Dysphagia, unspecified: Secondary | ICD-10-CM | POA: Diagnosis not present

## 2018-02-25 DIAGNOSIS — I69391 Dysphagia following cerebral infarction: Secondary | ICD-10-CM | POA: Diagnosis not present

## 2018-02-25 DIAGNOSIS — I69320 Aphasia following cerebral infarction: Secondary | ICD-10-CM | POA: Diagnosis not present

## 2018-02-25 DIAGNOSIS — F209 Schizophrenia, unspecified: Secondary | ICD-10-CM | POA: Diagnosis not present

## 2018-02-25 DIAGNOSIS — F329 Major depressive disorder, single episode, unspecified: Secondary | ICD-10-CM | POA: Diagnosis not present

## 2018-02-26 DIAGNOSIS — I69354 Hemiplegia and hemiparesis following cerebral infarction affecting left non-dominant side: Secondary | ICD-10-CM | POA: Diagnosis not present

## 2018-02-26 DIAGNOSIS — F329 Major depressive disorder, single episode, unspecified: Secondary | ICD-10-CM | POA: Diagnosis not present

## 2018-02-26 DIAGNOSIS — R1310 Dysphagia, unspecified: Secondary | ICD-10-CM | POA: Diagnosis not present

## 2018-02-26 DIAGNOSIS — I69391 Dysphagia following cerebral infarction: Secondary | ICD-10-CM | POA: Diagnosis not present

## 2018-02-26 DIAGNOSIS — F209 Schizophrenia, unspecified: Secondary | ICD-10-CM | POA: Diagnosis not present

## 2018-02-26 DIAGNOSIS — I69320 Aphasia following cerebral infarction: Secondary | ICD-10-CM | POA: Diagnosis not present

## 2018-02-27 DIAGNOSIS — I69320 Aphasia following cerebral infarction: Secondary | ICD-10-CM | POA: Diagnosis not present

## 2018-02-27 DIAGNOSIS — I69354 Hemiplegia and hemiparesis following cerebral infarction affecting left non-dominant side: Secondary | ICD-10-CM | POA: Diagnosis not present

## 2018-02-27 DIAGNOSIS — I69391 Dysphagia following cerebral infarction: Secondary | ICD-10-CM | POA: Diagnosis not present

## 2018-02-27 DIAGNOSIS — F209 Schizophrenia, unspecified: Secondary | ICD-10-CM | POA: Diagnosis not present

## 2018-02-27 DIAGNOSIS — R1310 Dysphagia, unspecified: Secondary | ICD-10-CM | POA: Diagnosis not present

## 2018-02-27 DIAGNOSIS — F329 Major depressive disorder, single episode, unspecified: Secondary | ICD-10-CM | POA: Diagnosis not present

## 2018-02-28 DIAGNOSIS — R1310 Dysphagia, unspecified: Secondary | ICD-10-CM | POA: Diagnosis not present

## 2018-02-28 DIAGNOSIS — I69354 Hemiplegia and hemiparesis following cerebral infarction affecting left non-dominant side: Secondary | ICD-10-CM | POA: Diagnosis not present

## 2018-02-28 DIAGNOSIS — I69320 Aphasia following cerebral infarction: Secondary | ICD-10-CM | POA: Diagnosis not present

## 2018-02-28 DIAGNOSIS — I69391 Dysphagia following cerebral infarction: Secondary | ICD-10-CM | POA: Diagnosis not present

## 2018-02-28 DIAGNOSIS — F329 Major depressive disorder, single episode, unspecified: Secondary | ICD-10-CM | POA: Diagnosis not present

## 2018-02-28 DIAGNOSIS — F209 Schizophrenia, unspecified: Secondary | ICD-10-CM | POA: Diagnosis not present

## 2018-03-02 DIAGNOSIS — J449 Chronic obstructive pulmonary disease, unspecified: Secondary | ICD-10-CM | POA: Diagnosis not present

## 2018-03-02 DIAGNOSIS — F329 Major depressive disorder, single episode, unspecified: Secondary | ICD-10-CM | POA: Diagnosis not present

## 2018-03-02 DIAGNOSIS — R630 Anorexia: Secondary | ICD-10-CM | POA: Diagnosis not present

## 2018-03-02 DIAGNOSIS — G8929 Other chronic pain: Secondary | ICD-10-CM | POA: Diagnosis not present

## 2018-03-02 DIAGNOSIS — I69320 Aphasia following cerebral infarction: Secondary | ICD-10-CM | POA: Diagnosis not present

## 2018-03-02 DIAGNOSIS — K219 Gastro-esophageal reflux disease without esophagitis: Secondary | ICD-10-CM | POA: Diagnosis not present

## 2018-03-02 DIAGNOSIS — F419 Anxiety disorder, unspecified: Secondary | ICD-10-CM | POA: Diagnosis not present

## 2018-03-02 DIAGNOSIS — Z431 Encounter for attention to gastrostomy: Secondary | ICD-10-CM | POA: Diagnosis not present

## 2018-03-02 DIAGNOSIS — Z7401 Bed confinement status: Secondary | ICD-10-CM | POA: Diagnosis not present

## 2018-03-02 DIAGNOSIS — M24542 Contracture, left hand: Secondary | ICD-10-CM | POA: Diagnosis not present

## 2018-03-02 DIAGNOSIS — R1310 Dysphagia, unspecified: Secondary | ICD-10-CM | POA: Diagnosis not present

## 2018-03-02 DIAGNOSIS — R208 Other disturbances of skin sensation: Secondary | ICD-10-CM | POA: Diagnosis not present

## 2018-03-02 DIAGNOSIS — I69391 Dysphagia following cerebral infarction: Secondary | ICD-10-CM | POA: Diagnosis not present

## 2018-03-02 DIAGNOSIS — F209 Schizophrenia, unspecified: Secondary | ICD-10-CM | POA: Diagnosis not present

## 2018-03-02 DIAGNOSIS — M810 Age-related osteoporosis without current pathological fracture: Secondary | ICD-10-CM | POA: Diagnosis not present

## 2018-03-02 DIAGNOSIS — I69354 Hemiplegia and hemiparesis following cerebral infarction affecting left non-dominant side: Secondary | ICD-10-CM | POA: Diagnosis not present

## 2018-03-03 DIAGNOSIS — I69320 Aphasia following cerebral infarction: Secondary | ICD-10-CM | POA: Diagnosis not present

## 2018-03-03 DIAGNOSIS — R1310 Dysphagia, unspecified: Secondary | ICD-10-CM | POA: Diagnosis not present

## 2018-03-03 DIAGNOSIS — I69391 Dysphagia following cerebral infarction: Secondary | ICD-10-CM | POA: Diagnosis not present

## 2018-03-03 DIAGNOSIS — F209 Schizophrenia, unspecified: Secondary | ICD-10-CM | POA: Diagnosis not present

## 2018-03-03 DIAGNOSIS — F329 Major depressive disorder, single episode, unspecified: Secondary | ICD-10-CM | POA: Diagnosis not present

## 2018-03-03 DIAGNOSIS — I69354 Hemiplegia and hemiparesis following cerebral infarction affecting left non-dominant side: Secondary | ICD-10-CM | POA: Diagnosis not present

## 2018-03-04 DIAGNOSIS — L97429 Non-pressure chronic ulcer of left heel and midfoot with unspecified severity: Secondary | ICD-10-CM | POA: Diagnosis not present

## 2018-03-04 DIAGNOSIS — R1310 Dysphagia, unspecified: Secondary | ICD-10-CM | POA: Diagnosis not present

## 2018-03-04 DIAGNOSIS — F329 Major depressive disorder, single episode, unspecified: Secondary | ICD-10-CM | POA: Diagnosis not present

## 2018-03-04 DIAGNOSIS — F209 Schizophrenia, unspecified: Secondary | ICD-10-CM | POA: Diagnosis not present

## 2018-03-04 DIAGNOSIS — I69320 Aphasia following cerebral infarction: Secondary | ICD-10-CM | POA: Diagnosis not present

## 2018-03-04 DIAGNOSIS — I69391 Dysphagia following cerebral infarction: Secondary | ICD-10-CM | POA: Diagnosis not present

## 2018-03-04 DIAGNOSIS — I69354 Hemiplegia and hemiparesis following cerebral infarction affecting left non-dominant side: Secondary | ICD-10-CM | POA: Diagnosis not present

## 2018-03-05 DIAGNOSIS — I69391 Dysphagia following cerebral infarction: Secondary | ICD-10-CM | POA: Diagnosis not present

## 2018-03-05 DIAGNOSIS — F209 Schizophrenia, unspecified: Secondary | ICD-10-CM | POA: Diagnosis not present

## 2018-03-05 DIAGNOSIS — R1310 Dysphagia, unspecified: Secondary | ICD-10-CM | POA: Diagnosis not present

## 2018-03-05 DIAGNOSIS — I69354 Hemiplegia and hemiparesis following cerebral infarction affecting left non-dominant side: Secondary | ICD-10-CM | POA: Diagnosis not present

## 2018-03-05 DIAGNOSIS — I69320 Aphasia following cerebral infarction: Secondary | ICD-10-CM | POA: Diagnosis not present

## 2018-03-05 DIAGNOSIS — F329 Major depressive disorder, single episode, unspecified: Secondary | ICD-10-CM | POA: Diagnosis not present

## 2018-03-06 DIAGNOSIS — R1310 Dysphagia, unspecified: Secondary | ICD-10-CM | POA: Diagnosis not present

## 2018-03-06 DIAGNOSIS — I69391 Dysphagia following cerebral infarction: Secondary | ICD-10-CM | POA: Diagnosis not present

## 2018-03-06 DIAGNOSIS — I69354 Hemiplegia and hemiparesis following cerebral infarction affecting left non-dominant side: Secondary | ICD-10-CM | POA: Diagnosis not present

## 2018-03-06 DIAGNOSIS — I69320 Aphasia following cerebral infarction: Secondary | ICD-10-CM | POA: Diagnosis not present

## 2018-03-06 DIAGNOSIS — F209 Schizophrenia, unspecified: Secondary | ICD-10-CM | POA: Diagnosis not present

## 2018-03-06 DIAGNOSIS — F329 Major depressive disorder, single episode, unspecified: Secondary | ICD-10-CM | POA: Diagnosis not present

## 2018-03-07 DIAGNOSIS — I69391 Dysphagia following cerebral infarction: Secondary | ICD-10-CM | POA: Diagnosis not present

## 2018-03-07 DIAGNOSIS — I69354 Hemiplegia and hemiparesis following cerebral infarction affecting left non-dominant side: Secondary | ICD-10-CM | POA: Diagnosis not present

## 2018-03-07 DIAGNOSIS — R1310 Dysphagia, unspecified: Secondary | ICD-10-CM | POA: Diagnosis not present

## 2018-03-07 DIAGNOSIS — F329 Major depressive disorder, single episode, unspecified: Secondary | ICD-10-CM | POA: Diagnosis not present

## 2018-03-07 DIAGNOSIS — I69320 Aphasia following cerebral infarction: Secondary | ICD-10-CM | POA: Diagnosis not present

## 2018-03-07 DIAGNOSIS — F209 Schizophrenia, unspecified: Secondary | ICD-10-CM | POA: Diagnosis not present

## 2018-03-10 DIAGNOSIS — F209 Schizophrenia, unspecified: Secondary | ICD-10-CM | POA: Diagnosis not present

## 2018-03-10 DIAGNOSIS — I69320 Aphasia following cerebral infarction: Secondary | ICD-10-CM | POA: Diagnosis not present

## 2018-03-10 DIAGNOSIS — I69354 Hemiplegia and hemiparesis following cerebral infarction affecting left non-dominant side: Secondary | ICD-10-CM | POA: Diagnosis not present

## 2018-03-10 DIAGNOSIS — I69391 Dysphagia following cerebral infarction: Secondary | ICD-10-CM | POA: Diagnosis not present

## 2018-03-10 DIAGNOSIS — F329 Major depressive disorder, single episode, unspecified: Secondary | ICD-10-CM | POA: Diagnosis not present

## 2018-03-10 DIAGNOSIS — R1310 Dysphagia, unspecified: Secondary | ICD-10-CM | POA: Diagnosis not present

## 2018-03-11 DIAGNOSIS — I69354 Hemiplegia and hemiparesis following cerebral infarction affecting left non-dominant side: Secondary | ICD-10-CM | POA: Diagnosis not present

## 2018-03-11 DIAGNOSIS — F329 Major depressive disorder, single episode, unspecified: Secondary | ICD-10-CM | POA: Diagnosis not present

## 2018-03-11 DIAGNOSIS — R1310 Dysphagia, unspecified: Secondary | ICD-10-CM | POA: Diagnosis not present

## 2018-03-11 DIAGNOSIS — F209 Schizophrenia, unspecified: Secondary | ICD-10-CM | POA: Diagnosis not present

## 2018-03-11 DIAGNOSIS — I69391 Dysphagia following cerebral infarction: Secondary | ICD-10-CM | POA: Diagnosis not present

## 2018-03-11 DIAGNOSIS — I69320 Aphasia following cerebral infarction: Secondary | ICD-10-CM | POA: Diagnosis not present

## 2018-03-12 DIAGNOSIS — I69354 Hemiplegia and hemiparesis following cerebral infarction affecting left non-dominant side: Secondary | ICD-10-CM | POA: Diagnosis not present

## 2018-03-12 DIAGNOSIS — I69320 Aphasia following cerebral infarction: Secondary | ICD-10-CM | POA: Diagnosis not present

## 2018-03-12 DIAGNOSIS — F329 Major depressive disorder, single episode, unspecified: Secondary | ICD-10-CM | POA: Diagnosis not present

## 2018-03-12 DIAGNOSIS — R1310 Dysphagia, unspecified: Secondary | ICD-10-CM | POA: Diagnosis not present

## 2018-03-12 DIAGNOSIS — F209 Schizophrenia, unspecified: Secondary | ICD-10-CM | POA: Diagnosis not present

## 2018-03-12 DIAGNOSIS — I69391 Dysphagia following cerebral infarction: Secondary | ICD-10-CM | POA: Diagnosis not present

## 2018-03-13 DIAGNOSIS — I69354 Hemiplegia and hemiparesis following cerebral infarction affecting left non-dominant side: Secondary | ICD-10-CM | POA: Diagnosis not present

## 2018-03-13 DIAGNOSIS — F209 Schizophrenia, unspecified: Secondary | ICD-10-CM | POA: Diagnosis not present

## 2018-03-13 DIAGNOSIS — I69391 Dysphagia following cerebral infarction: Secondary | ICD-10-CM | POA: Diagnosis not present

## 2018-03-13 DIAGNOSIS — I69320 Aphasia following cerebral infarction: Secondary | ICD-10-CM | POA: Diagnosis not present

## 2018-03-13 DIAGNOSIS — F329 Major depressive disorder, single episode, unspecified: Secondary | ICD-10-CM | POA: Diagnosis not present

## 2018-03-13 DIAGNOSIS — R1310 Dysphagia, unspecified: Secondary | ICD-10-CM | POA: Diagnosis not present

## 2018-03-14 DIAGNOSIS — I69391 Dysphagia following cerebral infarction: Secondary | ICD-10-CM | POA: Diagnosis not present

## 2018-03-14 DIAGNOSIS — R1310 Dysphagia, unspecified: Secondary | ICD-10-CM | POA: Diagnosis not present

## 2018-03-14 DIAGNOSIS — I69320 Aphasia following cerebral infarction: Secondary | ICD-10-CM | POA: Diagnosis not present

## 2018-03-14 DIAGNOSIS — I69354 Hemiplegia and hemiparesis following cerebral infarction affecting left non-dominant side: Secondary | ICD-10-CM | POA: Diagnosis not present

## 2018-03-14 DIAGNOSIS — F329 Major depressive disorder, single episode, unspecified: Secondary | ICD-10-CM | POA: Diagnosis not present

## 2018-03-14 DIAGNOSIS — F209 Schizophrenia, unspecified: Secondary | ICD-10-CM | POA: Diagnosis not present

## 2018-03-17 DIAGNOSIS — I69320 Aphasia following cerebral infarction: Secondary | ICD-10-CM | POA: Diagnosis not present

## 2018-03-17 DIAGNOSIS — F209 Schizophrenia, unspecified: Secondary | ICD-10-CM | POA: Diagnosis not present

## 2018-03-17 DIAGNOSIS — F329 Major depressive disorder, single episode, unspecified: Secondary | ICD-10-CM | POA: Diagnosis not present

## 2018-03-17 DIAGNOSIS — R1310 Dysphagia, unspecified: Secondary | ICD-10-CM | POA: Diagnosis not present

## 2018-03-17 DIAGNOSIS — I69391 Dysphagia following cerebral infarction: Secondary | ICD-10-CM | POA: Diagnosis not present

## 2018-03-17 DIAGNOSIS — I69354 Hemiplegia and hemiparesis following cerebral infarction affecting left non-dominant side: Secondary | ICD-10-CM | POA: Diagnosis not present

## 2018-03-18 DIAGNOSIS — F209 Schizophrenia, unspecified: Secondary | ICD-10-CM | POA: Diagnosis not present

## 2018-03-18 DIAGNOSIS — I69354 Hemiplegia and hemiparesis following cerebral infarction affecting left non-dominant side: Secondary | ICD-10-CM | POA: Diagnosis not present

## 2018-03-18 DIAGNOSIS — I69320 Aphasia following cerebral infarction: Secondary | ICD-10-CM | POA: Diagnosis not present

## 2018-03-18 DIAGNOSIS — I69391 Dysphagia following cerebral infarction: Secondary | ICD-10-CM | POA: Diagnosis not present

## 2018-03-18 DIAGNOSIS — R1310 Dysphagia, unspecified: Secondary | ICD-10-CM | POA: Diagnosis not present

## 2018-03-18 DIAGNOSIS — F329 Major depressive disorder, single episode, unspecified: Secondary | ICD-10-CM | POA: Diagnosis not present

## 2018-03-19 DIAGNOSIS — F209 Schizophrenia, unspecified: Secondary | ICD-10-CM | POA: Diagnosis not present

## 2018-03-19 DIAGNOSIS — R1310 Dysphagia, unspecified: Secondary | ICD-10-CM | POA: Diagnosis not present

## 2018-03-19 DIAGNOSIS — I69354 Hemiplegia and hemiparesis following cerebral infarction affecting left non-dominant side: Secondary | ICD-10-CM | POA: Diagnosis not present

## 2018-03-19 DIAGNOSIS — F329 Major depressive disorder, single episode, unspecified: Secondary | ICD-10-CM | POA: Diagnosis not present

## 2018-03-19 DIAGNOSIS — I69391 Dysphagia following cerebral infarction: Secondary | ICD-10-CM | POA: Diagnosis not present

## 2018-03-19 DIAGNOSIS — I69320 Aphasia following cerebral infarction: Secondary | ICD-10-CM | POA: Diagnosis not present

## 2018-03-20 DIAGNOSIS — G8194 Hemiplegia, unspecified affecting left nondominant side: Secondary | ICD-10-CM | POA: Diagnosis not present

## 2018-03-20 DIAGNOSIS — F200 Paranoid schizophrenia: Secondary | ICD-10-CM | POA: Diagnosis not present

## 2018-03-20 DIAGNOSIS — G4089 Other seizures: Secondary | ICD-10-CM | POA: Diagnosis not present

## 2018-03-20 DIAGNOSIS — Z79891 Long term (current) use of opiate analgesic: Secondary | ICD-10-CM | POA: Diagnosis not present

## 2018-03-20 DIAGNOSIS — R1310 Dysphagia, unspecified: Secondary | ICD-10-CM | POA: Diagnosis not present

## 2018-03-20 DIAGNOSIS — F329 Major depressive disorder, single episode, unspecified: Secondary | ICD-10-CM | POA: Diagnosis not present

## 2018-03-20 DIAGNOSIS — F209 Schizophrenia, unspecified: Secondary | ICD-10-CM | POA: Diagnosis not present

## 2018-03-20 DIAGNOSIS — J439 Emphysema, unspecified: Secondary | ICD-10-CM | POA: Diagnosis not present

## 2018-03-20 DIAGNOSIS — I69320 Aphasia following cerebral infarction: Secondary | ICD-10-CM | POA: Diagnosis not present

## 2018-03-20 DIAGNOSIS — I69354 Hemiplegia and hemiparesis following cerebral infarction affecting left non-dominant side: Secondary | ICD-10-CM | POA: Diagnosis not present

## 2018-03-20 DIAGNOSIS — I69391 Dysphagia following cerebral infarction: Secondary | ICD-10-CM | POA: Diagnosis not present

## 2018-03-21 DIAGNOSIS — I69354 Hemiplegia and hemiparesis following cerebral infarction affecting left non-dominant side: Secondary | ICD-10-CM | POA: Diagnosis not present

## 2018-03-21 DIAGNOSIS — F209 Schizophrenia, unspecified: Secondary | ICD-10-CM | POA: Diagnosis not present

## 2018-03-21 DIAGNOSIS — I69391 Dysphagia following cerebral infarction: Secondary | ICD-10-CM | POA: Diagnosis not present

## 2018-03-21 DIAGNOSIS — F329 Major depressive disorder, single episode, unspecified: Secondary | ICD-10-CM | POA: Diagnosis not present

## 2018-03-21 DIAGNOSIS — R1310 Dysphagia, unspecified: Secondary | ICD-10-CM | POA: Diagnosis not present

## 2018-03-21 DIAGNOSIS — I69320 Aphasia following cerebral infarction: Secondary | ICD-10-CM | POA: Diagnosis not present

## 2018-03-24 DIAGNOSIS — I69320 Aphasia following cerebral infarction: Secondary | ICD-10-CM | POA: Diagnosis not present

## 2018-03-24 DIAGNOSIS — I69391 Dysphagia following cerebral infarction: Secondary | ICD-10-CM | POA: Diagnosis not present

## 2018-03-24 DIAGNOSIS — F209 Schizophrenia, unspecified: Secondary | ICD-10-CM | POA: Diagnosis not present

## 2018-03-24 DIAGNOSIS — F329 Major depressive disorder, single episode, unspecified: Secondary | ICD-10-CM | POA: Diagnosis not present

## 2018-03-24 DIAGNOSIS — I69354 Hemiplegia and hemiparesis following cerebral infarction affecting left non-dominant side: Secondary | ICD-10-CM | POA: Diagnosis not present

## 2018-03-24 DIAGNOSIS — R1310 Dysphagia, unspecified: Secondary | ICD-10-CM | POA: Diagnosis not present

## 2018-03-25 DIAGNOSIS — F209 Schizophrenia, unspecified: Secondary | ICD-10-CM | POA: Diagnosis not present

## 2018-03-25 DIAGNOSIS — I69320 Aphasia following cerebral infarction: Secondary | ICD-10-CM | POA: Diagnosis not present

## 2018-03-25 DIAGNOSIS — F329 Major depressive disorder, single episode, unspecified: Secondary | ICD-10-CM | POA: Diagnosis not present

## 2018-03-25 DIAGNOSIS — R1310 Dysphagia, unspecified: Secondary | ICD-10-CM | POA: Diagnosis not present

## 2018-03-25 DIAGNOSIS — I69391 Dysphagia following cerebral infarction: Secondary | ICD-10-CM | POA: Diagnosis not present

## 2018-03-25 DIAGNOSIS — I69354 Hemiplegia and hemiparesis following cerebral infarction affecting left non-dominant side: Secondary | ICD-10-CM | POA: Diagnosis not present

## 2018-03-26 DIAGNOSIS — I69391 Dysphagia following cerebral infarction: Secondary | ICD-10-CM | POA: Diagnosis not present

## 2018-03-26 DIAGNOSIS — F329 Major depressive disorder, single episode, unspecified: Secondary | ICD-10-CM | POA: Diagnosis not present

## 2018-03-26 DIAGNOSIS — F209 Schizophrenia, unspecified: Secondary | ICD-10-CM | POA: Diagnosis not present

## 2018-03-26 DIAGNOSIS — R1310 Dysphagia, unspecified: Secondary | ICD-10-CM | POA: Diagnosis not present

## 2018-03-26 DIAGNOSIS — I69354 Hemiplegia and hemiparesis following cerebral infarction affecting left non-dominant side: Secondary | ICD-10-CM | POA: Diagnosis not present

## 2018-03-26 DIAGNOSIS — I69320 Aphasia following cerebral infarction: Secondary | ICD-10-CM | POA: Diagnosis not present

## 2018-03-27 DIAGNOSIS — F329 Major depressive disorder, single episode, unspecified: Secondary | ICD-10-CM | POA: Diagnosis not present

## 2018-03-27 DIAGNOSIS — F209 Schizophrenia, unspecified: Secondary | ICD-10-CM | POA: Diagnosis not present

## 2018-03-27 DIAGNOSIS — R1310 Dysphagia, unspecified: Secondary | ICD-10-CM | POA: Diagnosis not present

## 2018-03-27 DIAGNOSIS — I69320 Aphasia following cerebral infarction: Secondary | ICD-10-CM | POA: Diagnosis not present

## 2018-03-27 DIAGNOSIS — I69354 Hemiplegia and hemiparesis following cerebral infarction affecting left non-dominant side: Secondary | ICD-10-CM | POA: Diagnosis not present

## 2018-03-27 DIAGNOSIS — I69391 Dysphagia following cerebral infarction: Secondary | ICD-10-CM | POA: Diagnosis not present

## 2018-03-28 DIAGNOSIS — I69354 Hemiplegia and hemiparesis following cerebral infarction affecting left non-dominant side: Secondary | ICD-10-CM | POA: Diagnosis not present

## 2018-03-28 DIAGNOSIS — F209 Schizophrenia, unspecified: Secondary | ICD-10-CM | POA: Diagnosis not present

## 2018-03-28 DIAGNOSIS — F329 Major depressive disorder, single episode, unspecified: Secondary | ICD-10-CM | POA: Diagnosis not present

## 2018-03-28 DIAGNOSIS — I69320 Aphasia following cerebral infarction: Secondary | ICD-10-CM | POA: Diagnosis not present

## 2018-03-28 DIAGNOSIS — R1310 Dysphagia, unspecified: Secondary | ICD-10-CM | POA: Diagnosis not present

## 2018-03-28 DIAGNOSIS — I69391 Dysphagia following cerebral infarction: Secondary | ICD-10-CM | POA: Diagnosis not present

## 2018-03-31 DIAGNOSIS — F329 Major depressive disorder, single episode, unspecified: Secondary | ICD-10-CM | POA: Diagnosis not present

## 2018-03-31 DIAGNOSIS — R1310 Dysphagia, unspecified: Secondary | ICD-10-CM | POA: Diagnosis not present

## 2018-03-31 DIAGNOSIS — F209 Schizophrenia, unspecified: Secondary | ICD-10-CM | POA: Diagnosis not present

## 2018-03-31 DIAGNOSIS — I69354 Hemiplegia and hemiparesis following cerebral infarction affecting left non-dominant side: Secondary | ICD-10-CM | POA: Diagnosis not present

## 2018-03-31 DIAGNOSIS — I69320 Aphasia following cerebral infarction: Secondary | ICD-10-CM | POA: Diagnosis not present

## 2018-03-31 DIAGNOSIS — I69391 Dysphagia following cerebral infarction: Secondary | ICD-10-CM | POA: Diagnosis not present

## 2018-04-01 DIAGNOSIS — G8929 Other chronic pain: Secondary | ICD-10-CM | POA: Diagnosis not present

## 2018-04-01 DIAGNOSIS — M24542 Contracture, left hand: Secondary | ICD-10-CM | POA: Diagnosis not present

## 2018-04-01 DIAGNOSIS — L89622 Pressure ulcer of left heel, stage 2: Secondary | ICD-10-CM | POA: Diagnosis not present

## 2018-04-01 DIAGNOSIS — M810 Age-related osteoporosis without current pathological fracture: Secondary | ICD-10-CM | POA: Diagnosis not present

## 2018-04-01 DIAGNOSIS — F329 Major depressive disorder, single episode, unspecified: Secondary | ICD-10-CM | POA: Diagnosis not present

## 2018-04-01 DIAGNOSIS — M21371 Foot drop, right foot: Secondary | ICD-10-CM | POA: Diagnosis not present

## 2018-04-01 DIAGNOSIS — K219 Gastro-esophageal reflux disease without esophagitis: Secondary | ICD-10-CM | POA: Diagnosis not present

## 2018-04-01 DIAGNOSIS — Z431 Encounter for attention to gastrostomy: Secondary | ICD-10-CM | POA: Diagnosis not present

## 2018-04-01 DIAGNOSIS — R600 Localized edema: Secondary | ICD-10-CM | POA: Diagnosis not present

## 2018-04-01 DIAGNOSIS — R630 Anorexia: Secondary | ICD-10-CM | POA: Diagnosis not present

## 2018-04-01 DIAGNOSIS — F419 Anxiety disorder, unspecified: Secondary | ICD-10-CM | POA: Diagnosis not present

## 2018-04-01 DIAGNOSIS — I69354 Hemiplegia and hemiparesis following cerebral infarction affecting left non-dominant side: Secondary | ICD-10-CM | POA: Diagnosis not present

## 2018-04-01 DIAGNOSIS — F209 Schizophrenia, unspecified: Secondary | ICD-10-CM | POA: Diagnosis not present

## 2018-04-01 DIAGNOSIS — R208 Other disturbances of skin sensation: Secondary | ICD-10-CM | POA: Diagnosis not present

## 2018-04-01 DIAGNOSIS — I69320 Aphasia following cerebral infarction: Secondary | ICD-10-CM | POA: Diagnosis not present

## 2018-04-01 DIAGNOSIS — Z7401 Bed confinement status: Secondary | ICD-10-CM | POA: Diagnosis not present

## 2018-04-01 DIAGNOSIS — J449 Chronic obstructive pulmonary disease, unspecified: Secondary | ICD-10-CM | POA: Diagnosis not present

## 2018-04-01 DIAGNOSIS — Z6821 Body mass index (BMI) 21.0-21.9, adult: Secondary | ICD-10-CM | POA: Diagnosis not present

## 2018-04-01 DIAGNOSIS — I69391 Dysphagia following cerebral infarction: Secondary | ICD-10-CM | POA: Diagnosis not present

## 2018-04-01 DIAGNOSIS — M21372 Foot drop, left foot: Secondary | ICD-10-CM | POA: Diagnosis not present

## 2018-04-02 DIAGNOSIS — I69391 Dysphagia following cerebral infarction: Secondary | ICD-10-CM | POA: Diagnosis not present

## 2018-04-02 DIAGNOSIS — F209 Schizophrenia, unspecified: Secondary | ICD-10-CM | POA: Diagnosis not present

## 2018-04-02 DIAGNOSIS — I69354 Hemiplegia and hemiparesis following cerebral infarction affecting left non-dominant side: Secondary | ICD-10-CM | POA: Diagnosis not present

## 2018-04-02 DIAGNOSIS — F419 Anxiety disorder, unspecified: Secondary | ICD-10-CM | POA: Diagnosis not present

## 2018-04-02 DIAGNOSIS — F329 Major depressive disorder, single episode, unspecified: Secondary | ICD-10-CM | POA: Diagnosis not present

## 2018-04-02 DIAGNOSIS — I69320 Aphasia following cerebral infarction: Secondary | ICD-10-CM | POA: Diagnosis not present

## 2018-04-03 DIAGNOSIS — F329 Major depressive disorder, single episode, unspecified: Secondary | ICD-10-CM | POA: Diagnosis not present

## 2018-04-03 DIAGNOSIS — I69354 Hemiplegia and hemiparesis following cerebral infarction affecting left non-dominant side: Secondary | ICD-10-CM | POA: Diagnosis not present

## 2018-04-03 DIAGNOSIS — I69391 Dysphagia following cerebral infarction: Secondary | ICD-10-CM | POA: Diagnosis not present

## 2018-04-03 DIAGNOSIS — F209 Schizophrenia, unspecified: Secondary | ICD-10-CM | POA: Diagnosis not present

## 2018-04-03 DIAGNOSIS — I69320 Aphasia following cerebral infarction: Secondary | ICD-10-CM | POA: Diagnosis not present

## 2018-04-03 DIAGNOSIS — F419 Anxiety disorder, unspecified: Secondary | ICD-10-CM | POA: Diagnosis not present

## 2018-04-04 DIAGNOSIS — I69354 Hemiplegia and hemiparesis following cerebral infarction affecting left non-dominant side: Secondary | ICD-10-CM | POA: Diagnosis not present

## 2018-04-04 DIAGNOSIS — F419 Anxiety disorder, unspecified: Secondary | ICD-10-CM | POA: Diagnosis not present

## 2018-04-04 DIAGNOSIS — I69391 Dysphagia following cerebral infarction: Secondary | ICD-10-CM | POA: Diagnosis not present

## 2018-04-04 DIAGNOSIS — F329 Major depressive disorder, single episode, unspecified: Secondary | ICD-10-CM | POA: Diagnosis not present

## 2018-04-04 DIAGNOSIS — F209 Schizophrenia, unspecified: Secondary | ICD-10-CM | POA: Diagnosis not present

## 2018-04-04 DIAGNOSIS — I69320 Aphasia following cerebral infarction: Secondary | ICD-10-CM | POA: Diagnosis not present

## 2018-04-07 DIAGNOSIS — F209 Schizophrenia, unspecified: Secondary | ICD-10-CM | POA: Diagnosis not present

## 2018-04-07 DIAGNOSIS — F329 Major depressive disorder, single episode, unspecified: Secondary | ICD-10-CM | POA: Diagnosis not present

## 2018-04-07 DIAGNOSIS — F419 Anxiety disorder, unspecified: Secondary | ICD-10-CM | POA: Diagnosis not present

## 2018-04-07 DIAGNOSIS — I69354 Hemiplegia and hemiparesis following cerebral infarction affecting left non-dominant side: Secondary | ICD-10-CM | POA: Diagnosis not present

## 2018-04-07 DIAGNOSIS — I69391 Dysphagia following cerebral infarction: Secondary | ICD-10-CM | POA: Diagnosis not present

## 2018-04-07 DIAGNOSIS — I69320 Aphasia following cerebral infarction: Secondary | ICD-10-CM | POA: Diagnosis not present

## 2018-04-08 DIAGNOSIS — F209 Schizophrenia, unspecified: Secondary | ICD-10-CM | POA: Diagnosis not present

## 2018-04-08 DIAGNOSIS — I69320 Aphasia following cerebral infarction: Secondary | ICD-10-CM | POA: Diagnosis not present

## 2018-04-08 DIAGNOSIS — I69354 Hemiplegia and hemiparesis following cerebral infarction affecting left non-dominant side: Secondary | ICD-10-CM | POA: Diagnosis not present

## 2018-04-08 DIAGNOSIS — F329 Major depressive disorder, single episode, unspecified: Secondary | ICD-10-CM | POA: Diagnosis not present

## 2018-04-08 DIAGNOSIS — F419 Anxiety disorder, unspecified: Secondary | ICD-10-CM | POA: Diagnosis not present

## 2018-04-08 DIAGNOSIS — I69391 Dysphagia following cerebral infarction: Secondary | ICD-10-CM | POA: Diagnosis not present

## 2018-04-09 DIAGNOSIS — F329 Major depressive disorder, single episode, unspecified: Secondary | ICD-10-CM | POA: Diagnosis not present

## 2018-04-09 DIAGNOSIS — F419 Anxiety disorder, unspecified: Secondary | ICD-10-CM | POA: Diagnosis not present

## 2018-04-09 DIAGNOSIS — F209 Schizophrenia, unspecified: Secondary | ICD-10-CM | POA: Diagnosis not present

## 2018-04-09 DIAGNOSIS — I69391 Dysphagia following cerebral infarction: Secondary | ICD-10-CM | POA: Diagnosis not present

## 2018-04-09 DIAGNOSIS — I69354 Hemiplegia and hemiparesis following cerebral infarction affecting left non-dominant side: Secondary | ICD-10-CM | POA: Diagnosis not present

## 2018-04-09 DIAGNOSIS — I69320 Aphasia following cerebral infarction: Secondary | ICD-10-CM | POA: Diagnosis not present

## 2018-04-10 DIAGNOSIS — I69391 Dysphagia following cerebral infarction: Secondary | ICD-10-CM | POA: Diagnosis not present

## 2018-04-10 DIAGNOSIS — F209 Schizophrenia, unspecified: Secondary | ICD-10-CM | POA: Diagnosis not present

## 2018-04-10 DIAGNOSIS — F329 Major depressive disorder, single episode, unspecified: Secondary | ICD-10-CM | POA: Diagnosis not present

## 2018-04-10 DIAGNOSIS — I69320 Aphasia following cerebral infarction: Secondary | ICD-10-CM | POA: Diagnosis not present

## 2018-04-10 DIAGNOSIS — I69354 Hemiplegia and hemiparesis following cerebral infarction affecting left non-dominant side: Secondary | ICD-10-CM | POA: Diagnosis not present

## 2018-04-10 DIAGNOSIS — F419 Anxiety disorder, unspecified: Secondary | ICD-10-CM | POA: Diagnosis not present

## 2018-04-11 DIAGNOSIS — F329 Major depressive disorder, single episode, unspecified: Secondary | ICD-10-CM | POA: Diagnosis not present

## 2018-04-11 DIAGNOSIS — F209 Schizophrenia, unspecified: Secondary | ICD-10-CM | POA: Diagnosis not present

## 2018-04-11 DIAGNOSIS — I69320 Aphasia following cerebral infarction: Secondary | ICD-10-CM | POA: Diagnosis not present

## 2018-04-11 DIAGNOSIS — I69391 Dysphagia following cerebral infarction: Secondary | ICD-10-CM | POA: Diagnosis not present

## 2018-04-11 DIAGNOSIS — I69354 Hemiplegia and hemiparesis following cerebral infarction affecting left non-dominant side: Secondary | ICD-10-CM | POA: Diagnosis not present

## 2018-04-11 DIAGNOSIS — F419 Anxiety disorder, unspecified: Secondary | ICD-10-CM | POA: Diagnosis not present

## 2018-04-14 DIAGNOSIS — F419 Anxiety disorder, unspecified: Secondary | ICD-10-CM | POA: Diagnosis not present

## 2018-04-14 DIAGNOSIS — I69391 Dysphagia following cerebral infarction: Secondary | ICD-10-CM | POA: Diagnosis not present

## 2018-04-14 DIAGNOSIS — F329 Major depressive disorder, single episode, unspecified: Secondary | ICD-10-CM | POA: Diagnosis not present

## 2018-04-14 DIAGNOSIS — I69354 Hemiplegia and hemiparesis following cerebral infarction affecting left non-dominant side: Secondary | ICD-10-CM | POA: Diagnosis not present

## 2018-04-14 DIAGNOSIS — F209 Schizophrenia, unspecified: Secondary | ICD-10-CM | POA: Diagnosis not present

## 2018-04-14 DIAGNOSIS — I69320 Aphasia following cerebral infarction: Secondary | ICD-10-CM | POA: Diagnosis not present

## 2018-04-15 DIAGNOSIS — I69320 Aphasia following cerebral infarction: Secondary | ICD-10-CM | POA: Diagnosis not present

## 2018-04-15 DIAGNOSIS — I69391 Dysphagia following cerebral infarction: Secondary | ICD-10-CM | POA: Diagnosis not present

## 2018-04-15 DIAGNOSIS — I69354 Hemiplegia and hemiparesis following cerebral infarction affecting left non-dominant side: Secondary | ICD-10-CM | POA: Diagnosis not present

## 2018-04-15 DIAGNOSIS — F419 Anxiety disorder, unspecified: Secondary | ICD-10-CM | POA: Diagnosis not present

## 2018-04-15 DIAGNOSIS — F329 Major depressive disorder, single episode, unspecified: Secondary | ICD-10-CM | POA: Diagnosis not present

## 2018-04-15 DIAGNOSIS — F209 Schizophrenia, unspecified: Secondary | ICD-10-CM | POA: Diagnosis not present

## 2018-04-16 DIAGNOSIS — I69354 Hemiplegia and hemiparesis following cerebral infarction affecting left non-dominant side: Secondary | ICD-10-CM | POA: Diagnosis not present

## 2018-04-16 DIAGNOSIS — F419 Anxiety disorder, unspecified: Secondary | ICD-10-CM | POA: Diagnosis not present

## 2018-04-16 DIAGNOSIS — F209 Schizophrenia, unspecified: Secondary | ICD-10-CM | POA: Diagnosis not present

## 2018-04-16 DIAGNOSIS — F329 Major depressive disorder, single episode, unspecified: Secondary | ICD-10-CM | POA: Diagnosis not present

## 2018-04-16 DIAGNOSIS — I69391 Dysphagia following cerebral infarction: Secondary | ICD-10-CM | POA: Diagnosis not present

## 2018-04-16 DIAGNOSIS — I69320 Aphasia following cerebral infarction: Secondary | ICD-10-CM | POA: Diagnosis not present

## 2018-04-17 DIAGNOSIS — F419 Anxiety disorder, unspecified: Secondary | ICD-10-CM | POA: Diagnosis not present

## 2018-04-17 DIAGNOSIS — I69391 Dysphagia following cerebral infarction: Secondary | ICD-10-CM | POA: Diagnosis not present

## 2018-04-17 DIAGNOSIS — F209 Schizophrenia, unspecified: Secondary | ICD-10-CM | POA: Diagnosis not present

## 2018-04-17 DIAGNOSIS — I69320 Aphasia following cerebral infarction: Secondary | ICD-10-CM | POA: Diagnosis not present

## 2018-04-17 DIAGNOSIS — F329 Major depressive disorder, single episode, unspecified: Secondary | ICD-10-CM | POA: Diagnosis not present

## 2018-04-17 DIAGNOSIS — I69354 Hemiplegia and hemiparesis following cerebral infarction affecting left non-dominant side: Secondary | ICD-10-CM | POA: Diagnosis not present

## 2018-04-18 DIAGNOSIS — I69354 Hemiplegia and hemiparesis following cerebral infarction affecting left non-dominant side: Secondary | ICD-10-CM | POA: Diagnosis not present

## 2018-04-18 DIAGNOSIS — I69320 Aphasia following cerebral infarction: Secondary | ICD-10-CM | POA: Diagnosis not present

## 2018-04-18 DIAGNOSIS — F419 Anxiety disorder, unspecified: Secondary | ICD-10-CM | POA: Diagnosis not present

## 2018-04-18 DIAGNOSIS — I69391 Dysphagia following cerebral infarction: Secondary | ICD-10-CM | POA: Diagnosis not present

## 2018-04-18 DIAGNOSIS — F329 Major depressive disorder, single episode, unspecified: Secondary | ICD-10-CM | POA: Diagnosis not present

## 2018-04-18 DIAGNOSIS — F209 Schizophrenia, unspecified: Secondary | ICD-10-CM | POA: Diagnosis not present

## 2018-04-21 DIAGNOSIS — I69354 Hemiplegia and hemiparesis following cerebral infarction affecting left non-dominant side: Secondary | ICD-10-CM | POA: Diagnosis not present

## 2018-04-21 DIAGNOSIS — F329 Major depressive disorder, single episode, unspecified: Secondary | ICD-10-CM | POA: Diagnosis not present

## 2018-04-21 DIAGNOSIS — I69320 Aphasia following cerebral infarction: Secondary | ICD-10-CM | POA: Diagnosis not present

## 2018-04-21 DIAGNOSIS — F419 Anxiety disorder, unspecified: Secondary | ICD-10-CM | POA: Diagnosis not present

## 2018-04-21 DIAGNOSIS — F209 Schizophrenia, unspecified: Secondary | ICD-10-CM | POA: Diagnosis not present

## 2018-04-21 DIAGNOSIS — I69391 Dysphagia following cerebral infarction: Secondary | ICD-10-CM | POA: Diagnosis not present

## 2018-04-22 DIAGNOSIS — F329 Major depressive disorder, single episode, unspecified: Secondary | ICD-10-CM | POA: Diagnosis not present

## 2018-04-22 DIAGNOSIS — F419 Anxiety disorder, unspecified: Secondary | ICD-10-CM | POA: Diagnosis not present

## 2018-04-22 DIAGNOSIS — I69354 Hemiplegia and hemiparesis following cerebral infarction affecting left non-dominant side: Secondary | ICD-10-CM | POA: Diagnosis not present

## 2018-04-22 DIAGNOSIS — I69391 Dysphagia following cerebral infarction: Secondary | ICD-10-CM | POA: Diagnosis not present

## 2018-04-22 DIAGNOSIS — I69320 Aphasia following cerebral infarction: Secondary | ICD-10-CM | POA: Diagnosis not present

## 2018-04-22 DIAGNOSIS — F209 Schizophrenia, unspecified: Secondary | ICD-10-CM | POA: Diagnosis not present

## 2018-04-23 DIAGNOSIS — F209 Schizophrenia, unspecified: Secondary | ICD-10-CM | POA: Diagnosis not present

## 2018-04-23 DIAGNOSIS — F329 Major depressive disorder, single episode, unspecified: Secondary | ICD-10-CM | POA: Diagnosis not present

## 2018-04-23 DIAGNOSIS — I69354 Hemiplegia and hemiparesis following cerebral infarction affecting left non-dominant side: Secondary | ICD-10-CM | POA: Diagnosis not present

## 2018-04-23 DIAGNOSIS — I69320 Aphasia following cerebral infarction: Secondary | ICD-10-CM | POA: Diagnosis not present

## 2018-04-23 DIAGNOSIS — F419 Anxiety disorder, unspecified: Secondary | ICD-10-CM | POA: Diagnosis not present

## 2018-04-23 DIAGNOSIS — I69391 Dysphagia following cerebral infarction: Secondary | ICD-10-CM | POA: Diagnosis not present

## 2018-04-24 DIAGNOSIS — I69320 Aphasia following cerebral infarction: Secondary | ICD-10-CM | POA: Diagnosis not present

## 2018-04-24 DIAGNOSIS — I69354 Hemiplegia and hemiparesis following cerebral infarction affecting left non-dominant side: Secondary | ICD-10-CM | POA: Diagnosis not present

## 2018-04-24 DIAGNOSIS — F209 Schizophrenia, unspecified: Secondary | ICD-10-CM | POA: Diagnosis not present

## 2018-04-24 DIAGNOSIS — I69391 Dysphagia following cerebral infarction: Secondary | ICD-10-CM | POA: Diagnosis not present

## 2018-04-24 DIAGNOSIS — F329 Major depressive disorder, single episode, unspecified: Secondary | ICD-10-CM | POA: Diagnosis not present

## 2018-04-24 DIAGNOSIS — F419 Anxiety disorder, unspecified: Secondary | ICD-10-CM | POA: Diagnosis not present

## 2018-04-25 DIAGNOSIS — F419 Anxiety disorder, unspecified: Secondary | ICD-10-CM | POA: Diagnosis not present

## 2018-04-25 DIAGNOSIS — I69391 Dysphagia following cerebral infarction: Secondary | ICD-10-CM | POA: Diagnosis not present

## 2018-04-25 DIAGNOSIS — F329 Major depressive disorder, single episode, unspecified: Secondary | ICD-10-CM | POA: Diagnosis not present

## 2018-04-25 DIAGNOSIS — I69320 Aphasia following cerebral infarction: Secondary | ICD-10-CM | POA: Diagnosis not present

## 2018-04-25 DIAGNOSIS — I69354 Hemiplegia and hemiparesis following cerebral infarction affecting left non-dominant side: Secondary | ICD-10-CM | POA: Diagnosis not present

## 2018-04-25 DIAGNOSIS — F209 Schizophrenia, unspecified: Secondary | ICD-10-CM | POA: Diagnosis not present

## 2018-04-28 DIAGNOSIS — F329 Major depressive disorder, single episode, unspecified: Secondary | ICD-10-CM | POA: Diagnosis not present

## 2018-04-28 DIAGNOSIS — L89620 Pressure ulcer of left heel, unstageable: Secondary | ICD-10-CM | POA: Diagnosis not present

## 2018-04-28 DIAGNOSIS — I69320 Aphasia following cerebral infarction: Secondary | ICD-10-CM | POA: Diagnosis not present

## 2018-04-28 DIAGNOSIS — I69391 Dysphagia following cerebral infarction: Secondary | ICD-10-CM | POA: Diagnosis not present

## 2018-04-28 DIAGNOSIS — F209 Schizophrenia, unspecified: Secondary | ICD-10-CM | POA: Diagnosis not present

## 2018-04-28 DIAGNOSIS — I69354 Hemiplegia and hemiparesis following cerebral infarction affecting left non-dominant side: Secondary | ICD-10-CM | POA: Diagnosis not present

## 2018-04-28 DIAGNOSIS — F419 Anxiety disorder, unspecified: Secondary | ICD-10-CM | POA: Diagnosis not present

## 2018-04-29 DIAGNOSIS — F419 Anxiety disorder, unspecified: Secondary | ICD-10-CM | POA: Diagnosis not present

## 2018-04-29 DIAGNOSIS — I69391 Dysphagia following cerebral infarction: Secondary | ICD-10-CM | POA: Diagnosis not present

## 2018-04-29 DIAGNOSIS — I69354 Hemiplegia and hemiparesis following cerebral infarction affecting left non-dominant side: Secondary | ICD-10-CM | POA: Diagnosis not present

## 2018-04-29 DIAGNOSIS — I69320 Aphasia following cerebral infarction: Secondary | ICD-10-CM | POA: Diagnosis not present

## 2018-04-29 DIAGNOSIS — F209 Schizophrenia, unspecified: Secondary | ICD-10-CM | POA: Diagnosis not present

## 2018-04-29 DIAGNOSIS — F329 Major depressive disorder, single episode, unspecified: Secondary | ICD-10-CM | POA: Diagnosis not present

## 2018-04-30 DIAGNOSIS — F419 Anxiety disorder, unspecified: Secondary | ICD-10-CM | POA: Diagnosis not present

## 2018-04-30 DIAGNOSIS — I69320 Aphasia following cerebral infarction: Secondary | ICD-10-CM | POA: Diagnosis not present

## 2018-04-30 DIAGNOSIS — I69391 Dysphagia following cerebral infarction: Secondary | ICD-10-CM | POA: Diagnosis not present

## 2018-04-30 DIAGNOSIS — F209 Schizophrenia, unspecified: Secondary | ICD-10-CM | POA: Diagnosis not present

## 2018-04-30 DIAGNOSIS — F329 Major depressive disorder, single episode, unspecified: Secondary | ICD-10-CM | POA: Diagnosis not present

## 2018-04-30 DIAGNOSIS — I69354 Hemiplegia and hemiparesis following cerebral infarction affecting left non-dominant side: Secondary | ICD-10-CM | POA: Diagnosis not present

## 2018-05-01 DIAGNOSIS — I69391 Dysphagia following cerebral infarction: Secondary | ICD-10-CM | POA: Diagnosis not present

## 2018-05-01 DIAGNOSIS — I69320 Aphasia following cerebral infarction: Secondary | ICD-10-CM | POA: Diagnosis not present

## 2018-05-01 DIAGNOSIS — I69354 Hemiplegia and hemiparesis following cerebral infarction affecting left non-dominant side: Secondary | ICD-10-CM | POA: Diagnosis not present

## 2018-05-01 DIAGNOSIS — F329 Major depressive disorder, single episode, unspecified: Secondary | ICD-10-CM | POA: Diagnosis not present

## 2018-05-01 DIAGNOSIS — F209 Schizophrenia, unspecified: Secondary | ICD-10-CM | POA: Diagnosis not present

## 2018-05-01 DIAGNOSIS — F419 Anxiety disorder, unspecified: Secondary | ICD-10-CM | POA: Diagnosis not present

## 2018-05-02 DIAGNOSIS — L89622 Pressure ulcer of left heel, stage 2: Secondary | ICD-10-CM | POA: Diagnosis not present

## 2018-05-02 DIAGNOSIS — Z7401 Bed confinement status: Secondary | ICD-10-CM | POA: Diagnosis not present

## 2018-05-02 DIAGNOSIS — R630 Anorexia: Secondary | ICD-10-CM | POA: Diagnosis not present

## 2018-05-02 DIAGNOSIS — Z6821 Body mass index (BMI) 21.0-21.9, adult: Secondary | ICD-10-CM | POA: Diagnosis not present

## 2018-05-02 DIAGNOSIS — M21372 Foot drop, left foot: Secondary | ICD-10-CM | POA: Diagnosis not present

## 2018-05-02 DIAGNOSIS — M21371 Foot drop, right foot: Secondary | ICD-10-CM | POA: Diagnosis not present

## 2018-05-02 DIAGNOSIS — R600 Localized edema: Secondary | ICD-10-CM | POA: Diagnosis not present

## 2018-05-02 DIAGNOSIS — G8929 Other chronic pain: Secondary | ICD-10-CM | POA: Diagnosis not present

## 2018-05-02 DIAGNOSIS — J449 Chronic obstructive pulmonary disease, unspecified: Secondary | ICD-10-CM | POA: Diagnosis not present

## 2018-05-02 DIAGNOSIS — I69391 Dysphagia following cerebral infarction: Secondary | ICD-10-CM | POA: Diagnosis not present

## 2018-05-02 DIAGNOSIS — F419 Anxiety disorder, unspecified: Secondary | ICD-10-CM | POA: Diagnosis not present

## 2018-05-02 DIAGNOSIS — K219 Gastro-esophageal reflux disease without esophagitis: Secondary | ICD-10-CM | POA: Diagnosis not present

## 2018-05-02 DIAGNOSIS — R208 Other disturbances of skin sensation: Secondary | ICD-10-CM | POA: Diagnosis not present

## 2018-05-02 DIAGNOSIS — F209 Schizophrenia, unspecified: Secondary | ICD-10-CM | POA: Diagnosis not present

## 2018-05-02 DIAGNOSIS — M810 Age-related osteoporosis without current pathological fracture: Secondary | ICD-10-CM | POA: Diagnosis not present

## 2018-05-02 DIAGNOSIS — F329 Major depressive disorder, single episode, unspecified: Secondary | ICD-10-CM | POA: Diagnosis not present

## 2018-05-02 DIAGNOSIS — I69354 Hemiplegia and hemiparesis following cerebral infarction affecting left non-dominant side: Secondary | ICD-10-CM | POA: Diagnosis not present

## 2018-05-02 DIAGNOSIS — I69320 Aphasia following cerebral infarction: Secondary | ICD-10-CM | POA: Diagnosis not present

## 2018-05-02 DIAGNOSIS — M24542 Contracture, left hand: Secondary | ICD-10-CM | POA: Diagnosis not present

## 2018-05-02 DIAGNOSIS — Z431 Encounter for attention to gastrostomy: Secondary | ICD-10-CM | POA: Diagnosis not present

## 2018-05-05 DIAGNOSIS — I69354 Hemiplegia and hemiparesis following cerebral infarction affecting left non-dominant side: Secondary | ICD-10-CM | POA: Diagnosis not present

## 2018-05-05 DIAGNOSIS — I69320 Aphasia following cerebral infarction: Secondary | ICD-10-CM | POA: Diagnosis not present

## 2018-05-05 DIAGNOSIS — I69391 Dysphagia following cerebral infarction: Secondary | ICD-10-CM | POA: Diagnosis not present

## 2018-05-05 DIAGNOSIS — F419 Anxiety disorder, unspecified: Secondary | ICD-10-CM | POA: Diagnosis not present

## 2018-05-05 DIAGNOSIS — F209 Schizophrenia, unspecified: Secondary | ICD-10-CM | POA: Diagnosis not present

## 2018-05-05 DIAGNOSIS — F329 Major depressive disorder, single episode, unspecified: Secondary | ICD-10-CM | POA: Diagnosis not present

## 2018-05-06 DIAGNOSIS — F419 Anxiety disorder, unspecified: Secondary | ICD-10-CM | POA: Diagnosis not present

## 2018-05-06 DIAGNOSIS — I69320 Aphasia following cerebral infarction: Secondary | ICD-10-CM | POA: Diagnosis not present

## 2018-05-06 DIAGNOSIS — I69391 Dysphagia following cerebral infarction: Secondary | ICD-10-CM | POA: Diagnosis not present

## 2018-05-06 DIAGNOSIS — G40501 Epileptic seizures related to external causes, not intractable, with status epilepticus: Secondary | ICD-10-CM | POA: Diagnosis not present

## 2018-05-06 DIAGNOSIS — I69354 Hemiplegia and hemiparesis following cerebral infarction affecting left non-dominant side: Secondary | ICD-10-CM | POA: Diagnosis not present

## 2018-05-06 DIAGNOSIS — F329 Major depressive disorder, single episode, unspecified: Secondary | ICD-10-CM | POA: Diagnosis not present

## 2018-05-06 DIAGNOSIS — F209 Schizophrenia, unspecified: Secondary | ICD-10-CM | POA: Diagnosis not present

## 2018-05-07 DIAGNOSIS — F419 Anxiety disorder, unspecified: Secondary | ICD-10-CM | POA: Diagnosis not present

## 2018-05-07 DIAGNOSIS — I69391 Dysphagia following cerebral infarction: Secondary | ICD-10-CM | POA: Diagnosis not present

## 2018-05-07 DIAGNOSIS — I69354 Hemiplegia and hemiparesis following cerebral infarction affecting left non-dominant side: Secondary | ICD-10-CM | POA: Diagnosis not present

## 2018-05-07 DIAGNOSIS — F209 Schizophrenia, unspecified: Secondary | ICD-10-CM | POA: Diagnosis not present

## 2018-05-07 DIAGNOSIS — I69320 Aphasia following cerebral infarction: Secondary | ICD-10-CM | POA: Diagnosis not present

## 2018-05-07 DIAGNOSIS — F329 Major depressive disorder, single episode, unspecified: Secondary | ICD-10-CM | POA: Diagnosis not present

## 2018-05-08 DIAGNOSIS — F419 Anxiety disorder, unspecified: Secondary | ICD-10-CM | POA: Diagnosis not present

## 2018-05-08 DIAGNOSIS — F329 Major depressive disorder, single episode, unspecified: Secondary | ICD-10-CM | POA: Diagnosis not present

## 2018-05-08 DIAGNOSIS — F209 Schizophrenia, unspecified: Secondary | ICD-10-CM | POA: Diagnosis not present

## 2018-05-08 DIAGNOSIS — I69354 Hemiplegia and hemiparesis following cerebral infarction affecting left non-dominant side: Secondary | ICD-10-CM | POA: Diagnosis not present

## 2018-05-08 DIAGNOSIS — I69391 Dysphagia following cerebral infarction: Secondary | ICD-10-CM | POA: Diagnosis not present

## 2018-05-08 DIAGNOSIS — I69320 Aphasia following cerebral infarction: Secondary | ICD-10-CM | POA: Diagnosis not present

## 2018-05-09 DIAGNOSIS — F329 Major depressive disorder, single episode, unspecified: Secondary | ICD-10-CM | POA: Diagnosis not present

## 2018-05-09 DIAGNOSIS — I69354 Hemiplegia and hemiparesis following cerebral infarction affecting left non-dominant side: Secondary | ICD-10-CM | POA: Diagnosis not present

## 2018-05-09 DIAGNOSIS — I69391 Dysphagia following cerebral infarction: Secondary | ICD-10-CM | POA: Diagnosis not present

## 2018-05-09 DIAGNOSIS — I69320 Aphasia following cerebral infarction: Secondary | ICD-10-CM | POA: Diagnosis not present

## 2018-05-09 DIAGNOSIS — F419 Anxiety disorder, unspecified: Secondary | ICD-10-CM | POA: Diagnosis not present

## 2018-05-09 DIAGNOSIS — F209 Schizophrenia, unspecified: Secondary | ICD-10-CM | POA: Diagnosis not present

## 2018-05-12 DIAGNOSIS — I69354 Hemiplegia and hemiparesis following cerebral infarction affecting left non-dominant side: Secondary | ICD-10-CM | POA: Diagnosis not present

## 2018-05-12 DIAGNOSIS — I69320 Aphasia following cerebral infarction: Secondary | ICD-10-CM | POA: Diagnosis not present

## 2018-05-12 DIAGNOSIS — F209 Schizophrenia, unspecified: Secondary | ICD-10-CM | POA: Diagnosis not present

## 2018-05-12 DIAGNOSIS — F329 Major depressive disorder, single episode, unspecified: Secondary | ICD-10-CM | POA: Diagnosis not present

## 2018-05-12 DIAGNOSIS — I69391 Dysphagia following cerebral infarction: Secondary | ICD-10-CM | POA: Diagnosis not present

## 2018-05-12 DIAGNOSIS — F419 Anxiety disorder, unspecified: Secondary | ICD-10-CM | POA: Diagnosis not present

## 2018-05-13 DIAGNOSIS — I69354 Hemiplegia and hemiparesis following cerebral infarction affecting left non-dominant side: Secondary | ICD-10-CM | POA: Diagnosis not present

## 2018-05-13 DIAGNOSIS — I69391 Dysphagia following cerebral infarction: Secondary | ICD-10-CM | POA: Diagnosis not present

## 2018-05-13 DIAGNOSIS — F419 Anxiety disorder, unspecified: Secondary | ICD-10-CM | POA: Diagnosis not present

## 2018-05-13 DIAGNOSIS — I69320 Aphasia following cerebral infarction: Secondary | ICD-10-CM | POA: Diagnosis not present

## 2018-05-13 DIAGNOSIS — F209 Schizophrenia, unspecified: Secondary | ICD-10-CM | POA: Diagnosis not present

## 2018-05-13 DIAGNOSIS — F329 Major depressive disorder, single episode, unspecified: Secondary | ICD-10-CM | POA: Diagnosis not present

## 2018-05-14 DIAGNOSIS — I69320 Aphasia following cerebral infarction: Secondary | ICD-10-CM | POA: Diagnosis not present

## 2018-05-14 DIAGNOSIS — F419 Anxiety disorder, unspecified: Secondary | ICD-10-CM | POA: Diagnosis not present

## 2018-05-14 DIAGNOSIS — I69354 Hemiplegia and hemiparesis following cerebral infarction affecting left non-dominant side: Secondary | ICD-10-CM | POA: Diagnosis not present

## 2018-05-14 DIAGNOSIS — F209 Schizophrenia, unspecified: Secondary | ICD-10-CM | POA: Diagnosis not present

## 2018-05-14 DIAGNOSIS — F329 Major depressive disorder, single episode, unspecified: Secondary | ICD-10-CM | POA: Diagnosis not present

## 2018-05-14 DIAGNOSIS — I69391 Dysphagia following cerebral infarction: Secondary | ICD-10-CM | POA: Diagnosis not present

## 2018-05-15 DIAGNOSIS — I69320 Aphasia following cerebral infarction: Secondary | ICD-10-CM | POA: Diagnosis not present

## 2018-05-15 DIAGNOSIS — I69354 Hemiplegia and hemiparesis following cerebral infarction affecting left non-dominant side: Secondary | ICD-10-CM | POA: Diagnosis not present

## 2018-05-15 DIAGNOSIS — I69391 Dysphagia following cerebral infarction: Secondary | ICD-10-CM | POA: Diagnosis not present

## 2018-05-15 DIAGNOSIS — F209 Schizophrenia, unspecified: Secondary | ICD-10-CM | POA: Diagnosis not present

## 2018-05-15 DIAGNOSIS — F419 Anxiety disorder, unspecified: Secondary | ICD-10-CM | POA: Diagnosis not present

## 2018-05-15 DIAGNOSIS — F329 Major depressive disorder, single episode, unspecified: Secondary | ICD-10-CM | POA: Diagnosis not present

## 2018-05-16 DIAGNOSIS — F329 Major depressive disorder, single episode, unspecified: Secondary | ICD-10-CM | POA: Diagnosis not present

## 2018-05-16 DIAGNOSIS — I69354 Hemiplegia and hemiparesis following cerebral infarction affecting left non-dominant side: Secondary | ICD-10-CM | POA: Diagnosis not present

## 2018-05-16 DIAGNOSIS — I69320 Aphasia following cerebral infarction: Secondary | ICD-10-CM | POA: Diagnosis not present

## 2018-05-16 DIAGNOSIS — F209 Schizophrenia, unspecified: Secondary | ICD-10-CM | POA: Diagnosis not present

## 2018-05-16 DIAGNOSIS — I69391 Dysphagia following cerebral infarction: Secondary | ICD-10-CM | POA: Diagnosis not present

## 2018-05-16 DIAGNOSIS — F419 Anxiety disorder, unspecified: Secondary | ICD-10-CM | POA: Diagnosis not present

## 2018-05-19 DIAGNOSIS — F329 Major depressive disorder, single episode, unspecified: Secondary | ICD-10-CM | POA: Diagnosis not present

## 2018-05-19 DIAGNOSIS — I69391 Dysphagia following cerebral infarction: Secondary | ICD-10-CM | POA: Diagnosis not present

## 2018-05-19 DIAGNOSIS — F209 Schizophrenia, unspecified: Secondary | ICD-10-CM | POA: Diagnosis not present

## 2018-05-19 DIAGNOSIS — I69320 Aphasia following cerebral infarction: Secondary | ICD-10-CM | POA: Diagnosis not present

## 2018-05-19 DIAGNOSIS — I69354 Hemiplegia and hemiparesis following cerebral infarction affecting left non-dominant side: Secondary | ICD-10-CM | POA: Diagnosis not present

## 2018-05-19 DIAGNOSIS — F419 Anxiety disorder, unspecified: Secondary | ICD-10-CM | POA: Diagnosis not present

## 2018-05-20 DIAGNOSIS — I69391 Dysphagia following cerebral infarction: Secondary | ICD-10-CM | POA: Diagnosis not present

## 2018-05-20 DIAGNOSIS — F209 Schizophrenia, unspecified: Secondary | ICD-10-CM | POA: Diagnosis not present

## 2018-05-20 DIAGNOSIS — L89622 Pressure ulcer of left heel, stage 2: Secondary | ICD-10-CM | POA: Diagnosis not present

## 2018-05-20 DIAGNOSIS — G40909 Epilepsy, unspecified, not intractable, without status epilepticus: Secondary | ICD-10-CM | POA: Diagnosis not present

## 2018-05-20 DIAGNOSIS — F419 Anxiety disorder, unspecified: Secondary | ICD-10-CM | POA: Diagnosis not present

## 2018-05-20 DIAGNOSIS — I69354 Hemiplegia and hemiparesis following cerebral infarction affecting left non-dominant side: Secondary | ICD-10-CM | POA: Diagnosis not present

## 2018-05-20 DIAGNOSIS — M1990 Unspecified osteoarthritis, unspecified site: Secondary | ICD-10-CM | POA: Diagnosis not present

## 2018-05-20 DIAGNOSIS — L89522 Pressure ulcer of left ankle, stage 2: Secondary | ICD-10-CM | POA: Diagnosis not present

## 2018-05-20 DIAGNOSIS — I69320 Aphasia following cerebral infarction: Secondary | ICD-10-CM | POA: Diagnosis not present

## 2018-05-20 DIAGNOSIS — F329 Major depressive disorder, single episode, unspecified: Secondary | ICD-10-CM | POA: Diagnosis not present

## 2018-05-21 DIAGNOSIS — F209 Schizophrenia, unspecified: Secondary | ICD-10-CM | POA: Diagnosis not present

## 2018-05-21 DIAGNOSIS — F419 Anxiety disorder, unspecified: Secondary | ICD-10-CM | POA: Diagnosis not present

## 2018-05-21 DIAGNOSIS — F329 Major depressive disorder, single episode, unspecified: Secondary | ICD-10-CM | POA: Diagnosis not present

## 2018-05-21 DIAGNOSIS — I69391 Dysphagia following cerebral infarction: Secondary | ICD-10-CM | POA: Diagnosis not present

## 2018-05-21 DIAGNOSIS — I69320 Aphasia following cerebral infarction: Secondary | ICD-10-CM | POA: Diagnosis not present

## 2018-05-21 DIAGNOSIS — I69354 Hemiplegia and hemiparesis following cerebral infarction affecting left non-dominant side: Secondary | ICD-10-CM | POA: Diagnosis not present

## 2018-05-22 DIAGNOSIS — F419 Anxiety disorder, unspecified: Secondary | ICD-10-CM | POA: Diagnosis not present

## 2018-05-22 DIAGNOSIS — I69354 Hemiplegia and hemiparesis following cerebral infarction affecting left non-dominant side: Secondary | ICD-10-CM | POA: Diagnosis not present

## 2018-05-22 DIAGNOSIS — I69391 Dysphagia following cerebral infarction: Secondary | ICD-10-CM | POA: Diagnosis not present

## 2018-05-22 DIAGNOSIS — F329 Major depressive disorder, single episode, unspecified: Secondary | ICD-10-CM | POA: Diagnosis not present

## 2018-05-22 DIAGNOSIS — F209 Schizophrenia, unspecified: Secondary | ICD-10-CM | POA: Diagnosis not present

## 2018-05-22 DIAGNOSIS — I69320 Aphasia following cerebral infarction: Secondary | ICD-10-CM | POA: Diagnosis not present

## 2018-05-23 DIAGNOSIS — I69320 Aphasia following cerebral infarction: Secondary | ICD-10-CM | POA: Diagnosis not present

## 2018-05-23 DIAGNOSIS — F39 Unspecified mood [affective] disorder: Secondary | ICD-10-CM | POA: Diagnosis not present

## 2018-05-23 DIAGNOSIS — F209 Schizophrenia, unspecified: Secondary | ICD-10-CM | POA: Diagnosis not present

## 2018-05-23 DIAGNOSIS — M545 Low back pain: Secondary | ICD-10-CM | POA: Diagnosis not present

## 2018-05-23 DIAGNOSIS — J449 Chronic obstructive pulmonary disease, unspecified: Secondary | ICD-10-CM

## 2018-05-23 DIAGNOSIS — I69391 Dysphagia following cerebral infarction: Secondary | ICD-10-CM | POA: Diagnosis not present

## 2018-05-23 DIAGNOSIS — F329 Major depressive disorder, single episode, unspecified: Secondary | ICD-10-CM | POA: Diagnosis not present

## 2018-05-23 DIAGNOSIS — F200 Paranoid schizophrenia: Secondary | ICD-10-CM

## 2018-05-23 DIAGNOSIS — G40909 Epilepsy, unspecified, not intractable, without status epilepticus: Secondary | ICD-10-CM

## 2018-05-23 DIAGNOSIS — I69354 Hemiplegia and hemiparesis following cerebral infarction affecting left non-dominant side: Secondary | ICD-10-CM | POA: Diagnosis not present

## 2018-05-23 DIAGNOSIS — F419 Anxiety disorder, unspecified: Secondary | ICD-10-CM | POA: Diagnosis not present

## 2018-05-23 DIAGNOSIS — F1120 Opioid dependence, uncomplicated: Secondary | ICD-10-CM

## 2018-05-23 DIAGNOSIS — I69398 Other sequelae of cerebral infarction: Secondary | ICD-10-CM | POA: Diagnosis not present

## 2018-05-26 DIAGNOSIS — F329 Major depressive disorder, single episode, unspecified: Secondary | ICD-10-CM | POA: Diagnosis not present

## 2018-05-26 DIAGNOSIS — F209 Schizophrenia, unspecified: Secondary | ICD-10-CM | POA: Diagnosis not present

## 2018-05-26 DIAGNOSIS — F419 Anxiety disorder, unspecified: Secondary | ICD-10-CM | POA: Diagnosis not present

## 2018-05-26 DIAGNOSIS — I69391 Dysphagia following cerebral infarction: Secondary | ICD-10-CM | POA: Diagnosis not present

## 2018-05-26 DIAGNOSIS — I69354 Hemiplegia and hemiparesis following cerebral infarction affecting left non-dominant side: Secondary | ICD-10-CM | POA: Diagnosis not present

## 2018-05-26 DIAGNOSIS — I69320 Aphasia following cerebral infarction: Secondary | ICD-10-CM | POA: Diagnosis not present

## 2018-05-27 DIAGNOSIS — I69320 Aphasia following cerebral infarction: Secondary | ICD-10-CM | POA: Diagnosis not present

## 2018-05-27 DIAGNOSIS — F419 Anxiety disorder, unspecified: Secondary | ICD-10-CM | POA: Diagnosis not present

## 2018-05-27 DIAGNOSIS — F209 Schizophrenia, unspecified: Secondary | ICD-10-CM | POA: Diagnosis not present

## 2018-05-27 DIAGNOSIS — I69391 Dysphagia following cerebral infarction: Secondary | ICD-10-CM | POA: Diagnosis not present

## 2018-05-27 DIAGNOSIS — F329 Major depressive disorder, single episode, unspecified: Secondary | ICD-10-CM | POA: Diagnosis not present

## 2018-05-27 DIAGNOSIS — I69354 Hemiplegia and hemiparesis following cerebral infarction affecting left non-dominant side: Secondary | ICD-10-CM | POA: Diagnosis not present

## 2018-05-28 DIAGNOSIS — I69320 Aphasia following cerebral infarction: Secondary | ICD-10-CM | POA: Diagnosis not present

## 2018-05-28 DIAGNOSIS — I69391 Dysphagia following cerebral infarction: Secondary | ICD-10-CM | POA: Diagnosis not present

## 2018-05-28 DIAGNOSIS — F209 Schizophrenia, unspecified: Secondary | ICD-10-CM | POA: Diagnosis not present

## 2018-05-28 DIAGNOSIS — F329 Major depressive disorder, single episode, unspecified: Secondary | ICD-10-CM | POA: Diagnosis not present

## 2018-05-28 DIAGNOSIS — I69354 Hemiplegia and hemiparesis following cerebral infarction affecting left non-dominant side: Secondary | ICD-10-CM | POA: Diagnosis not present

## 2018-05-28 DIAGNOSIS — F419 Anxiety disorder, unspecified: Secondary | ICD-10-CM | POA: Diagnosis not present

## 2018-05-29 DIAGNOSIS — I69354 Hemiplegia and hemiparesis following cerebral infarction affecting left non-dominant side: Secondary | ICD-10-CM | POA: Diagnosis not present

## 2018-05-29 DIAGNOSIS — F419 Anxiety disorder, unspecified: Secondary | ICD-10-CM | POA: Diagnosis not present

## 2018-05-29 DIAGNOSIS — I69320 Aphasia following cerebral infarction: Secondary | ICD-10-CM | POA: Diagnosis not present

## 2018-05-29 DIAGNOSIS — F329 Major depressive disorder, single episode, unspecified: Secondary | ICD-10-CM | POA: Diagnosis not present

## 2018-05-29 DIAGNOSIS — F209 Schizophrenia, unspecified: Secondary | ICD-10-CM | POA: Diagnosis not present

## 2018-05-29 DIAGNOSIS — I69391 Dysphagia following cerebral infarction: Secondary | ICD-10-CM | POA: Diagnosis not present

## 2018-05-30 DIAGNOSIS — F209 Schizophrenia, unspecified: Secondary | ICD-10-CM | POA: Diagnosis not present

## 2018-05-30 DIAGNOSIS — F419 Anxiety disorder, unspecified: Secondary | ICD-10-CM | POA: Diagnosis not present

## 2018-05-30 DIAGNOSIS — I69391 Dysphagia following cerebral infarction: Secondary | ICD-10-CM | POA: Diagnosis not present

## 2018-05-30 DIAGNOSIS — I69354 Hemiplegia and hemiparesis following cerebral infarction affecting left non-dominant side: Secondary | ICD-10-CM | POA: Diagnosis not present

## 2018-05-30 DIAGNOSIS — I69320 Aphasia following cerebral infarction: Secondary | ICD-10-CM | POA: Diagnosis not present

## 2018-05-30 DIAGNOSIS — F329 Major depressive disorder, single episode, unspecified: Secondary | ICD-10-CM | POA: Diagnosis not present

## 2018-06-01 DIAGNOSIS — I69354 Hemiplegia and hemiparesis following cerebral infarction affecting left non-dominant side: Secondary | ICD-10-CM | POA: Diagnosis not present

## 2018-06-01 DIAGNOSIS — M24542 Contracture, left hand: Secondary | ICD-10-CM | POA: Diagnosis not present

## 2018-06-01 DIAGNOSIS — F209 Schizophrenia, unspecified: Secondary | ICD-10-CM | POA: Diagnosis not present

## 2018-06-01 DIAGNOSIS — R208 Other disturbances of skin sensation: Secondary | ICD-10-CM | POA: Diagnosis not present

## 2018-06-01 DIAGNOSIS — F329 Major depressive disorder, single episode, unspecified: Secondary | ICD-10-CM | POA: Diagnosis not present

## 2018-06-01 DIAGNOSIS — J449 Chronic obstructive pulmonary disease, unspecified: Secondary | ICD-10-CM | POA: Diagnosis not present

## 2018-06-01 DIAGNOSIS — M21371 Foot drop, right foot: Secondary | ICD-10-CM | POA: Diagnosis not present

## 2018-06-01 DIAGNOSIS — R630 Anorexia: Secondary | ICD-10-CM | POA: Diagnosis not present

## 2018-06-01 DIAGNOSIS — G8929 Other chronic pain: Secondary | ICD-10-CM | POA: Diagnosis not present

## 2018-06-01 DIAGNOSIS — I69391 Dysphagia following cerebral infarction: Secondary | ICD-10-CM | POA: Diagnosis not present

## 2018-06-01 DIAGNOSIS — R569 Unspecified convulsions: Secondary | ICD-10-CM | POA: Diagnosis not present

## 2018-06-01 DIAGNOSIS — R600 Localized edema: Secondary | ICD-10-CM | POA: Diagnosis not present

## 2018-06-01 DIAGNOSIS — Z6821 Body mass index (BMI) 21.0-21.9, adult: Secondary | ICD-10-CM | POA: Diagnosis not present

## 2018-06-01 DIAGNOSIS — M810 Age-related osteoporosis without current pathological fracture: Secondary | ICD-10-CM | POA: Diagnosis not present

## 2018-06-01 DIAGNOSIS — I69320 Aphasia following cerebral infarction: Secondary | ICD-10-CM | POA: Diagnosis not present

## 2018-06-01 DIAGNOSIS — K219 Gastro-esophageal reflux disease without esophagitis: Secondary | ICD-10-CM | POA: Diagnosis not present

## 2018-06-01 DIAGNOSIS — Z431 Encounter for attention to gastrostomy: Secondary | ICD-10-CM | POA: Diagnosis not present

## 2018-06-01 DIAGNOSIS — F419 Anxiety disorder, unspecified: Secondary | ICD-10-CM | POA: Diagnosis not present

## 2018-06-01 DIAGNOSIS — L89522 Pressure ulcer of left ankle, stage 2: Secondary | ICD-10-CM | POA: Diagnosis not present

## 2018-06-01 DIAGNOSIS — M21372 Foot drop, left foot: Secondary | ICD-10-CM | POA: Diagnosis not present

## 2018-06-01 DIAGNOSIS — Z7401 Bed confinement status: Secondary | ICD-10-CM | POA: Diagnosis not present

## 2018-06-01 DIAGNOSIS — L89622 Pressure ulcer of left heel, stage 2: Secondary | ICD-10-CM | POA: Diagnosis not present

## 2018-06-02 DIAGNOSIS — F419 Anxiety disorder, unspecified: Secondary | ICD-10-CM | POA: Diagnosis not present

## 2018-06-02 DIAGNOSIS — I69391 Dysphagia following cerebral infarction: Secondary | ICD-10-CM | POA: Diagnosis not present

## 2018-06-02 DIAGNOSIS — F209 Schizophrenia, unspecified: Secondary | ICD-10-CM | POA: Diagnosis not present

## 2018-06-02 DIAGNOSIS — I69354 Hemiplegia and hemiparesis following cerebral infarction affecting left non-dominant side: Secondary | ICD-10-CM | POA: Diagnosis not present

## 2018-06-02 DIAGNOSIS — F329 Major depressive disorder, single episode, unspecified: Secondary | ICD-10-CM | POA: Diagnosis not present

## 2018-06-02 DIAGNOSIS — I69320 Aphasia following cerebral infarction: Secondary | ICD-10-CM | POA: Diagnosis not present

## 2018-06-03 DIAGNOSIS — F209 Schizophrenia, unspecified: Secondary | ICD-10-CM | POA: Diagnosis not present

## 2018-06-03 DIAGNOSIS — L89520 Pressure ulcer of left ankle, unstageable: Secondary | ICD-10-CM | POA: Diagnosis not present

## 2018-06-03 DIAGNOSIS — I69391 Dysphagia following cerebral infarction: Secondary | ICD-10-CM | POA: Diagnosis not present

## 2018-06-03 DIAGNOSIS — I69354 Hemiplegia and hemiparesis following cerebral infarction affecting left non-dominant side: Secondary | ICD-10-CM | POA: Diagnosis not present

## 2018-06-03 DIAGNOSIS — I69320 Aphasia following cerebral infarction: Secondary | ICD-10-CM | POA: Diagnosis not present

## 2018-06-03 DIAGNOSIS — L89890 Pressure ulcer of other site, unstageable: Secondary | ICD-10-CM | POA: Diagnosis not present

## 2018-06-03 DIAGNOSIS — F419 Anxiety disorder, unspecified: Secondary | ICD-10-CM | POA: Diagnosis not present

## 2018-06-03 DIAGNOSIS — L89620 Pressure ulcer of left heel, unstageable: Secondary | ICD-10-CM | POA: Diagnosis not present

## 2018-06-03 DIAGNOSIS — F329 Major depressive disorder, single episode, unspecified: Secondary | ICD-10-CM | POA: Diagnosis not present

## 2018-06-04 DIAGNOSIS — I69320 Aphasia following cerebral infarction: Secondary | ICD-10-CM | POA: Diagnosis not present

## 2018-06-04 DIAGNOSIS — I69354 Hemiplegia and hemiparesis following cerebral infarction affecting left non-dominant side: Secondary | ICD-10-CM | POA: Diagnosis not present

## 2018-06-04 DIAGNOSIS — F419 Anxiety disorder, unspecified: Secondary | ICD-10-CM | POA: Diagnosis not present

## 2018-06-04 DIAGNOSIS — I69391 Dysphagia following cerebral infarction: Secondary | ICD-10-CM | POA: Diagnosis not present

## 2018-06-04 DIAGNOSIS — F209 Schizophrenia, unspecified: Secondary | ICD-10-CM | POA: Diagnosis not present

## 2018-06-04 DIAGNOSIS — F329 Major depressive disorder, single episode, unspecified: Secondary | ICD-10-CM | POA: Diagnosis not present

## 2018-06-05 DIAGNOSIS — I69391 Dysphagia following cerebral infarction: Secondary | ICD-10-CM | POA: Diagnosis not present

## 2018-06-05 DIAGNOSIS — I69354 Hemiplegia and hemiparesis following cerebral infarction affecting left non-dominant side: Secondary | ICD-10-CM | POA: Diagnosis not present

## 2018-06-05 DIAGNOSIS — F329 Major depressive disorder, single episode, unspecified: Secondary | ICD-10-CM | POA: Diagnosis not present

## 2018-06-05 DIAGNOSIS — I69320 Aphasia following cerebral infarction: Secondary | ICD-10-CM | POA: Diagnosis not present

## 2018-06-05 DIAGNOSIS — F209 Schizophrenia, unspecified: Secondary | ICD-10-CM | POA: Diagnosis not present

## 2018-06-05 DIAGNOSIS — F419 Anxiety disorder, unspecified: Secondary | ICD-10-CM | POA: Diagnosis not present

## 2018-06-06 DIAGNOSIS — F329 Major depressive disorder, single episode, unspecified: Secondary | ICD-10-CM | POA: Diagnosis not present

## 2018-06-06 DIAGNOSIS — I69391 Dysphagia following cerebral infarction: Secondary | ICD-10-CM | POA: Diagnosis not present

## 2018-06-06 DIAGNOSIS — I69354 Hemiplegia and hemiparesis following cerebral infarction affecting left non-dominant side: Secondary | ICD-10-CM | POA: Diagnosis not present

## 2018-06-06 DIAGNOSIS — I69320 Aphasia following cerebral infarction: Secondary | ICD-10-CM | POA: Diagnosis not present

## 2018-06-06 DIAGNOSIS — F419 Anxiety disorder, unspecified: Secondary | ICD-10-CM | POA: Diagnosis not present

## 2018-06-06 DIAGNOSIS — F209 Schizophrenia, unspecified: Secondary | ICD-10-CM | POA: Diagnosis not present

## 2018-06-09 DIAGNOSIS — F209 Schizophrenia, unspecified: Secondary | ICD-10-CM | POA: Diagnosis not present

## 2018-06-09 DIAGNOSIS — I69354 Hemiplegia and hemiparesis following cerebral infarction affecting left non-dominant side: Secondary | ICD-10-CM | POA: Diagnosis not present

## 2018-06-09 DIAGNOSIS — I69391 Dysphagia following cerebral infarction: Secondary | ICD-10-CM | POA: Diagnosis not present

## 2018-06-09 DIAGNOSIS — F419 Anxiety disorder, unspecified: Secondary | ICD-10-CM | POA: Diagnosis not present

## 2018-06-09 DIAGNOSIS — I69320 Aphasia following cerebral infarction: Secondary | ICD-10-CM | POA: Diagnosis not present

## 2018-06-09 DIAGNOSIS — F329 Major depressive disorder, single episode, unspecified: Secondary | ICD-10-CM | POA: Diagnosis not present

## 2018-06-10 DIAGNOSIS — F329 Major depressive disorder, single episode, unspecified: Secondary | ICD-10-CM | POA: Diagnosis not present

## 2018-06-10 DIAGNOSIS — L89626 Pressure-induced deep tissue damage of left heel: Secondary | ICD-10-CM | POA: Diagnosis not present

## 2018-06-10 DIAGNOSIS — L89526 Pressure-induced deep tissue damage of left ankle: Secondary | ICD-10-CM | POA: Diagnosis not present

## 2018-06-10 DIAGNOSIS — I69320 Aphasia following cerebral infarction: Secondary | ICD-10-CM | POA: Diagnosis not present

## 2018-06-10 DIAGNOSIS — F209 Schizophrenia, unspecified: Secondary | ICD-10-CM | POA: Diagnosis not present

## 2018-06-10 DIAGNOSIS — I69354 Hemiplegia and hemiparesis following cerebral infarction affecting left non-dominant side: Secondary | ICD-10-CM | POA: Diagnosis not present

## 2018-06-10 DIAGNOSIS — L89896 Pressure-induced deep tissue damage of other site: Secondary | ICD-10-CM | POA: Diagnosis not present

## 2018-06-10 DIAGNOSIS — I69391 Dysphagia following cerebral infarction: Secondary | ICD-10-CM | POA: Diagnosis not present

## 2018-06-10 DIAGNOSIS — F419 Anxiety disorder, unspecified: Secondary | ICD-10-CM | POA: Diagnosis not present

## 2018-06-10 DIAGNOSIS — I69352 Hemiplegia and hemiparesis following cerebral infarction affecting left dominant side: Secondary | ICD-10-CM | POA: Diagnosis not present

## 2018-06-11 DIAGNOSIS — F419 Anxiety disorder, unspecified: Secondary | ICD-10-CM | POA: Diagnosis not present

## 2018-06-11 DIAGNOSIS — B351 Tinea unguium: Secondary | ICD-10-CM | POA: Diagnosis not present

## 2018-06-11 DIAGNOSIS — I69354 Hemiplegia and hemiparesis following cerebral infarction affecting left non-dominant side: Secondary | ICD-10-CM | POA: Diagnosis not present

## 2018-06-11 DIAGNOSIS — F329 Major depressive disorder, single episode, unspecified: Secondary | ICD-10-CM | POA: Diagnosis not present

## 2018-06-11 DIAGNOSIS — I69391 Dysphagia following cerebral infarction: Secondary | ICD-10-CM | POA: Diagnosis not present

## 2018-06-11 DIAGNOSIS — F209 Schizophrenia, unspecified: Secondary | ICD-10-CM | POA: Diagnosis not present

## 2018-06-11 DIAGNOSIS — I69320 Aphasia following cerebral infarction: Secondary | ICD-10-CM | POA: Diagnosis not present

## 2018-06-12 DIAGNOSIS — F209 Schizophrenia, unspecified: Secondary | ICD-10-CM | POA: Diagnosis not present

## 2018-06-12 DIAGNOSIS — I69320 Aphasia following cerebral infarction: Secondary | ICD-10-CM | POA: Diagnosis not present

## 2018-06-12 DIAGNOSIS — I69354 Hemiplegia and hemiparesis following cerebral infarction affecting left non-dominant side: Secondary | ICD-10-CM | POA: Diagnosis not present

## 2018-06-12 DIAGNOSIS — F419 Anxiety disorder, unspecified: Secondary | ICD-10-CM | POA: Diagnosis not present

## 2018-06-12 DIAGNOSIS — F329 Major depressive disorder, single episode, unspecified: Secondary | ICD-10-CM | POA: Diagnosis not present

## 2018-06-12 DIAGNOSIS — I69391 Dysphagia following cerebral infarction: Secondary | ICD-10-CM | POA: Diagnosis not present

## 2018-06-13 DIAGNOSIS — I69320 Aphasia following cerebral infarction: Secondary | ICD-10-CM | POA: Diagnosis not present

## 2018-06-13 DIAGNOSIS — I69354 Hemiplegia and hemiparesis following cerebral infarction affecting left non-dominant side: Secondary | ICD-10-CM | POA: Diagnosis not present

## 2018-06-13 DIAGNOSIS — I69391 Dysphagia following cerebral infarction: Secondary | ICD-10-CM | POA: Diagnosis not present

## 2018-06-13 DIAGNOSIS — F419 Anxiety disorder, unspecified: Secondary | ICD-10-CM | POA: Diagnosis not present

## 2018-06-13 DIAGNOSIS — F329 Major depressive disorder, single episode, unspecified: Secondary | ICD-10-CM | POA: Diagnosis not present

## 2018-06-13 DIAGNOSIS — F209 Schizophrenia, unspecified: Secondary | ICD-10-CM | POA: Diagnosis not present

## 2018-06-16 DIAGNOSIS — F209 Schizophrenia, unspecified: Secondary | ICD-10-CM | POA: Diagnosis not present

## 2018-06-16 DIAGNOSIS — I69391 Dysphagia following cerebral infarction: Secondary | ICD-10-CM | POA: Diagnosis not present

## 2018-06-16 DIAGNOSIS — F329 Major depressive disorder, single episode, unspecified: Secondary | ICD-10-CM | POA: Diagnosis not present

## 2018-06-16 DIAGNOSIS — F419 Anxiety disorder, unspecified: Secondary | ICD-10-CM | POA: Diagnosis not present

## 2018-06-16 DIAGNOSIS — I69354 Hemiplegia and hemiparesis following cerebral infarction affecting left non-dominant side: Secondary | ICD-10-CM | POA: Diagnosis not present

## 2018-06-16 DIAGNOSIS — I69320 Aphasia following cerebral infarction: Secondary | ICD-10-CM | POA: Diagnosis not present

## 2018-06-17 DIAGNOSIS — L89896 Pressure-induced deep tissue damage of other site: Secondary | ICD-10-CM | POA: Diagnosis not present

## 2018-06-17 DIAGNOSIS — I69320 Aphasia following cerebral infarction: Secondary | ICD-10-CM | POA: Diagnosis not present

## 2018-06-17 DIAGNOSIS — F419 Anxiety disorder, unspecified: Secondary | ICD-10-CM | POA: Diagnosis not present

## 2018-06-17 DIAGNOSIS — L89526 Pressure-induced deep tissue damage of left ankle: Secondary | ICD-10-CM | POA: Diagnosis not present

## 2018-06-17 DIAGNOSIS — F329 Major depressive disorder, single episode, unspecified: Secondary | ICD-10-CM | POA: Diagnosis not present

## 2018-06-17 DIAGNOSIS — I69391 Dysphagia following cerebral infarction: Secondary | ICD-10-CM | POA: Diagnosis not present

## 2018-06-17 DIAGNOSIS — I69352 Hemiplegia and hemiparesis following cerebral infarction affecting left dominant side: Secondary | ICD-10-CM | POA: Diagnosis not present

## 2018-06-17 DIAGNOSIS — I69354 Hemiplegia and hemiparesis following cerebral infarction affecting left non-dominant side: Secondary | ICD-10-CM | POA: Diagnosis not present

## 2018-06-17 DIAGNOSIS — F209 Schizophrenia, unspecified: Secondary | ICD-10-CM | POA: Diagnosis not present

## 2018-06-17 DIAGNOSIS — L89626 Pressure-induced deep tissue damage of left heel: Secondary | ICD-10-CM | POA: Diagnosis not present

## 2018-06-18 DIAGNOSIS — I69354 Hemiplegia and hemiparesis following cerebral infarction affecting left non-dominant side: Secondary | ICD-10-CM | POA: Diagnosis not present

## 2018-06-18 DIAGNOSIS — I69320 Aphasia following cerebral infarction: Secondary | ICD-10-CM | POA: Diagnosis not present

## 2018-06-18 DIAGNOSIS — I69391 Dysphagia following cerebral infarction: Secondary | ICD-10-CM | POA: Diagnosis not present

## 2018-06-18 DIAGNOSIS — F419 Anxiety disorder, unspecified: Secondary | ICD-10-CM | POA: Diagnosis not present

## 2018-06-18 DIAGNOSIS — F329 Major depressive disorder, single episode, unspecified: Secondary | ICD-10-CM | POA: Diagnosis not present

## 2018-06-18 DIAGNOSIS — F209 Schizophrenia, unspecified: Secondary | ICD-10-CM | POA: Diagnosis not present

## 2018-06-19 DIAGNOSIS — F209 Schizophrenia, unspecified: Secondary | ICD-10-CM | POA: Diagnosis not present

## 2018-06-19 DIAGNOSIS — I69391 Dysphagia following cerebral infarction: Secondary | ICD-10-CM | POA: Diagnosis not present

## 2018-06-19 DIAGNOSIS — I69354 Hemiplegia and hemiparesis following cerebral infarction affecting left non-dominant side: Secondary | ICD-10-CM | POA: Diagnosis not present

## 2018-06-19 DIAGNOSIS — F329 Major depressive disorder, single episode, unspecified: Secondary | ICD-10-CM | POA: Diagnosis not present

## 2018-06-19 DIAGNOSIS — F419 Anxiety disorder, unspecified: Secondary | ICD-10-CM | POA: Diagnosis not present

## 2018-06-19 DIAGNOSIS — I69320 Aphasia following cerebral infarction: Secondary | ICD-10-CM | POA: Diagnosis not present

## 2018-06-20 DIAGNOSIS — F329 Major depressive disorder, single episode, unspecified: Secondary | ICD-10-CM | POA: Diagnosis not present

## 2018-06-20 DIAGNOSIS — F209 Schizophrenia, unspecified: Secondary | ICD-10-CM | POA: Diagnosis not present

## 2018-06-20 DIAGNOSIS — I69354 Hemiplegia and hemiparesis following cerebral infarction affecting left non-dominant side: Secondary | ICD-10-CM | POA: Diagnosis not present

## 2018-06-20 DIAGNOSIS — I69320 Aphasia following cerebral infarction: Secondary | ICD-10-CM | POA: Diagnosis not present

## 2018-06-20 DIAGNOSIS — I69391 Dysphagia following cerebral infarction: Secondary | ICD-10-CM | POA: Diagnosis not present

## 2018-06-20 DIAGNOSIS — F419 Anxiety disorder, unspecified: Secondary | ICD-10-CM | POA: Diagnosis not present

## 2018-06-23 DIAGNOSIS — F329 Major depressive disorder, single episode, unspecified: Secondary | ICD-10-CM | POA: Diagnosis not present

## 2018-06-23 DIAGNOSIS — I69320 Aphasia following cerebral infarction: Secondary | ICD-10-CM | POA: Diagnosis not present

## 2018-06-23 DIAGNOSIS — I69354 Hemiplegia and hemiparesis following cerebral infarction affecting left non-dominant side: Secondary | ICD-10-CM | POA: Diagnosis not present

## 2018-06-23 DIAGNOSIS — F419 Anxiety disorder, unspecified: Secondary | ICD-10-CM | POA: Diagnosis not present

## 2018-06-23 DIAGNOSIS — I69391 Dysphagia following cerebral infarction: Secondary | ICD-10-CM | POA: Diagnosis not present

## 2018-06-23 DIAGNOSIS — F209 Schizophrenia, unspecified: Secondary | ICD-10-CM | POA: Diagnosis not present

## 2018-06-24 DIAGNOSIS — F209 Schizophrenia, unspecified: Secondary | ICD-10-CM | POA: Diagnosis not present

## 2018-06-24 DIAGNOSIS — I69391 Dysphagia following cerebral infarction: Secondary | ICD-10-CM | POA: Diagnosis not present

## 2018-06-24 DIAGNOSIS — L89896 Pressure-induced deep tissue damage of other site: Secondary | ICD-10-CM | POA: Diagnosis not present

## 2018-06-24 DIAGNOSIS — L89626 Pressure-induced deep tissue damage of left heel: Secondary | ICD-10-CM | POA: Diagnosis not present

## 2018-06-24 DIAGNOSIS — F419 Anxiety disorder, unspecified: Secondary | ICD-10-CM | POA: Diagnosis not present

## 2018-06-24 DIAGNOSIS — L89526 Pressure-induced deep tissue damage of left ankle: Secondary | ICD-10-CM | POA: Diagnosis not present

## 2018-06-24 DIAGNOSIS — M1990 Unspecified osteoarthritis, unspecified site: Secondary | ICD-10-CM | POA: Diagnosis not present

## 2018-06-24 DIAGNOSIS — I69320 Aphasia following cerebral infarction: Secondary | ICD-10-CM | POA: Diagnosis not present

## 2018-06-24 DIAGNOSIS — F329 Major depressive disorder, single episode, unspecified: Secondary | ICD-10-CM | POA: Diagnosis not present

## 2018-06-24 DIAGNOSIS — I69354 Hemiplegia and hemiparesis following cerebral infarction affecting left non-dominant side: Secondary | ICD-10-CM | POA: Diagnosis not present

## 2018-06-25 DIAGNOSIS — I69354 Hemiplegia and hemiparesis following cerebral infarction affecting left non-dominant side: Secondary | ICD-10-CM | POA: Diagnosis not present

## 2018-06-25 DIAGNOSIS — I69320 Aphasia following cerebral infarction: Secondary | ICD-10-CM | POA: Diagnosis not present

## 2018-06-25 DIAGNOSIS — F209 Schizophrenia, unspecified: Secondary | ICD-10-CM | POA: Diagnosis not present

## 2018-06-25 DIAGNOSIS — F419 Anxiety disorder, unspecified: Secondary | ICD-10-CM | POA: Diagnosis not present

## 2018-06-25 DIAGNOSIS — F329 Major depressive disorder, single episode, unspecified: Secondary | ICD-10-CM | POA: Diagnosis not present

## 2018-06-25 DIAGNOSIS — I69391 Dysphagia following cerebral infarction: Secondary | ICD-10-CM | POA: Diagnosis not present

## 2018-06-26 DIAGNOSIS — F419 Anxiety disorder, unspecified: Secondary | ICD-10-CM | POA: Diagnosis not present

## 2018-06-26 DIAGNOSIS — F329 Major depressive disorder, single episode, unspecified: Secondary | ICD-10-CM | POA: Diagnosis not present

## 2018-06-26 DIAGNOSIS — I69354 Hemiplegia and hemiparesis following cerebral infarction affecting left non-dominant side: Secondary | ICD-10-CM | POA: Diagnosis not present

## 2018-06-26 DIAGNOSIS — I69320 Aphasia following cerebral infarction: Secondary | ICD-10-CM | POA: Diagnosis not present

## 2018-06-26 DIAGNOSIS — I69391 Dysphagia following cerebral infarction: Secondary | ICD-10-CM | POA: Diagnosis not present

## 2018-06-26 DIAGNOSIS — F209 Schizophrenia, unspecified: Secondary | ICD-10-CM | POA: Diagnosis not present

## 2018-06-27 DIAGNOSIS — I69391 Dysphagia following cerebral infarction: Secondary | ICD-10-CM | POA: Diagnosis not present

## 2018-06-27 DIAGNOSIS — I69320 Aphasia following cerebral infarction: Secondary | ICD-10-CM | POA: Diagnosis not present

## 2018-06-27 DIAGNOSIS — F329 Major depressive disorder, single episode, unspecified: Secondary | ICD-10-CM | POA: Diagnosis not present

## 2018-06-27 DIAGNOSIS — F419 Anxiety disorder, unspecified: Secondary | ICD-10-CM | POA: Diagnosis not present

## 2018-06-27 DIAGNOSIS — F209 Schizophrenia, unspecified: Secondary | ICD-10-CM | POA: Diagnosis not present

## 2018-06-27 DIAGNOSIS — I69354 Hemiplegia and hemiparesis following cerebral infarction affecting left non-dominant side: Secondary | ICD-10-CM | POA: Diagnosis not present

## 2018-06-30 DIAGNOSIS — I69391 Dysphagia following cerebral infarction: Secondary | ICD-10-CM | POA: Diagnosis not present

## 2018-06-30 DIAGNOSIS — I69320 Aphasia following cerebral infarction: Secondary | ICD-10-CM | POA: Diagnosis not present

## 2018-06-30 DIAGNOSIS — F209 Schizophrenia, unspecified: Secondary | ICD-10-CM | POA: Diagnosis not present

## 2018-06-30 DIAGNOSIS — F329 Major depressive disorder, single episode, unspecified: Secondary | ICD-10-CM | POA: Diagnosis not present

## 2018-06-30 DIAGNOSIS — F419 Anxiety disorder, unspecified: Secondary | ICD-10-CM | POA: Diagnosis not present

## 2018-06-30 DIAGNOSIS — I69354 Hemiplegia and hemiparesis following cerebral infarction affecting left non-dominant side: Secondary | ICD-10-CM | POA: Diagnosis not present

## 2018-07-01 DIAGNOSIS — F419 Anxiety disorder, unspecified: Secondary | ICD-10-CM | POA: Diagnosis not present

## 2018-07-01 DIAGNOSIS — F209 Schizophrenia, unspecified: Secondary | ICD-10-CM | POA: Diagnosis not present

## 2018-07-01 DIAGNOSIS — F329 Major depressive disorder, single episode, unspecified: Secondary | ICD-10-CM | POA: Diagnosis not present

## 2018-07-01 DIAGNOSIS — I69320 Aphasia following cerebral infarction: Secondary | ICD-10-CM | POA: Diagnosis not present

## 2018-07-01 DIAGNOSIS — I69354 Hemiplegia and hemiparesis following cerebral infarction affecting left non-dominant side: Secondary | ICD-10-CM | POA: Diagnosis not present

## 2018-07-01 DIAGNOSIS — I69391 Dysphagia following cerebral infarction: Secondary | ICD-10-CM | POA: Diagnosis not present

## 2018-07-02 DIAGNOSIS — L89622 Pressure ulcer of left heel, stage 2: Secondary | ICD-10-CM | POA: Diagnosis not present

## 2018-07-02 DIAGNOSIS — R569 Unspecified convulsions: Secondary | ICD-10-CM | POA: Diagnosis not present

## 2018-07-02 DIAGNOSIS — F209 Schizophrenia, unspecified: Secondary | ICD-10-CM | POA: Diagnosis not present

## 2018-07-02 DIAGNOSIS — I69354 Hemiplegia and hemiparesis following cerebral infarction affecting left non-dominant side: Secondary | ICD-10-CM | POA: Diagnosis not present

## 2018-07-02 DIAGNOSIS — F419 Anxiety disorder, unspecified: Secondary | ICD-10-CM | POA: Diagnosis not present

## 2018-07-02 DIAGNOSIS — M21372 Foot drop, left foot: Secondary | ICD-10-CM | POA: Diagnosis not present

## 2018-07-02 DIAGNOSIS — R630 Anorexia: Secondary | ICD-10-CM | POA: Diagnosis not present

## 2018-07-02 DIAGNOSIS — M810 Age-related osteoporosis without current pathological fracture: Secondary | ICD-10-CM | POA: Diagnosis not present

## 2018-07-02 DIAGNOSIS — G8929 Other chronic pain: Secondary | ICD-10-CM | POA: Diagnosis not present

## 2018-07-02 DIAGNOSIS — Z6821 Body mass index (BMI) 21.0-21.9, adult: Secondary | ICD-10-CM | POA: Diagnosis not present

## 2018-07-02 DIAGNOSIS — R600 Localized edema: Secondary | ICD-10-CM | POA: Diagnosis not present

## 2018-07-02 DIAGNOSIS — F329 Major depressive disorder, single episode, unspecified: Secondary | ICD-10-CM | POA: Diagnosis not present

## 2018-07-02 DIAGNOSIS — M21371 Foot drop, right foot: Secondary | ICD-10-CM | POA: Diagnosis not present

## 2018-07-02 DIAGNOSIS — L89522 Pressure ulcer of left ankle, stage 2: Secondary | ICD-10-CM | POA: Diagnosis not present

## 2018-07-02 DIAGNOSIS — Z7401 Bed confinement status: Secondary | ICD-10-CM | POA: Diagnosis not present

## 2018-07-02 DIAGNOSIS — M24542 Contracture, left hand: Secondary | ICD-10-CM | POA: Diagnosis not present

## 2018-07-02 DIAGNOSIS — J449 Chronic obstructive pulmonary disease, unspecified: Secondary | ICD-10-CM | POA: Diagnosis not present

## 2018-07-02 DIAGNOSIS — R208 Other disturbances of skin sensation: Secondary | ICD-10-CM | POA: Diagnosis not present

## 2018-07-02 DIAGNOSIS — K219 Gastro-esophageal reflux disease without esophagitis: Secondary | ICD-10-CM | POA: Diagnosis not present

## 2018-07-02 DIAGNOSIS — I69391 Dysphagia following cerebral infarction: Secondary | ICD-10-CM | POA: Diagnosis not present

## 2018-07-02 DIAGNOSIS — I69320 Aphasia following cerebral infarction: Secondary | ICD-10-CM | POA: Diagnosis not present

## 2018-07-02 DIAGNOSIS — Z431 Encounter for attention to gastrostomy: Secondary | ICD-10-CM | POA: Diagnosis not present

## 2018-07-03 DIAGNOSIS — F209 Schizophrenia, unspecified: Secondary | ICD-10-CM | POA: Diagnosis not present

## 2018-07-03 DIAGNOSIS — I69354 Hemiplegia and hemiparesis following cerebral infarction affecting left non-dominant side: Secondary | ICD-10-CM | POA: Diagnosis not present

## 2018-07-03 DIAGNOSIS — F419 Anxiety disorder, unspecified: Secondary | ICD-10-CM | POA: Diagnosis not present

## 2018-07-03 DIAGNOSIS — I69320 Aphasia following cerebral infarction: Secondary | ICD-10-CM | POA: Diagnosis not present

## 2018-07-03 DIAGNOSIS — I69391 Dysphagia following cerebral infarction: Secondary | ICD-10-CM | POA: Diagnosis not present

## 2018-07-03 DIAGNOSIS — S91302A Unspecified open wound, left foot, initial encounter: Secondary | ICD-10-CM | POA: Diagnosis not present

## 2018-07-03 DIAGNOSIS — F329 Major depressive disorder, single episode, unspecified: Secondary | ICD-10-CM | POA: Diagnosis not present

## 2018-07-04 DIAGNOSIS — I69354 Hemiplegia and hemiparesis following cerebral infarction affecting left non-dominant side: Secondary | ICD-10-CM | POA: Diagnosis not present

## 2018-07-04 DIAGNOSIS — F419 Anxiety disorder, unspecified: Secondary | ICD-10-CM | POA: Diagnosis not present

## 2018-07-04 DIAGNOSIS — F209 Schizophrenia, unspecified: Secondary | ICD-10-CM | POA: Diagnosis not present

## 2018-07-04 DIAGNOSIS — I69320 Aphasia following cerebral infarction: Secondary | ICD-10-CM | POA: Diagnosis not present

## 2018-07-04 DIAGNOSIS — I69391 Dysphagia following cerebral infarction: Secondary | ICD-10-CM | POA: Diagnosis not present

## 2018-07-04 DIAGNOSIS — F329 Major depressive disorder, single episode, unspecified: Secondary | ICD-10-CM | POA: Diagnosis not present

## 2018-07-07 DIAGNOSIS — I69354 Hemiplegia and hemiparesis following cerebral infarction affecting left non-dominant side: Secondary | ICD-10-CM | POA: Diagnosis not present

## 2018-07-07 DIAGNOSIS — F209 Schizophrenia, unspecified: Secondary | ICD-10-CM | POA: Diagnosis not present

## 2018-07-07 DIAGNOSIS — F419 Anxiety disorder, unspecified: Secondary | ICD-10-CM | POA: Diagnosis not present

## 2018-07-07 DIAGNOSIS — F329 Major depressive disorder, single episode, unspecified: Secondary | ICD-10-CM | POA: Diagnosis not present

## 2018-07-07 DIAGNOSIS — I69391 Dysphagia following cerebral infarction: Secondary | ICD-10-CM | POA: Diagnosis not present

## 2018-07-07 DIAGNOSIS — I69320 Aphasia following cerebral infarction: Secondary | ICD-10-CM | POA: Diagnosis not present

## 2018-07-08 DIAGNOSIS — L89626 Pressure-induced deep tissue damage of left heel: Secondary | ICD-10-CM | POA: Diagnosis not present

## 2018-07-08 DIAGNOSIS — L89526 Pressure-induced deep tissue damage of left ankle: Secondary | ICD-10-CM | POA: Diagnosis not present

## 2018-07-08 DIAGNOSIS — I69354 Hemiplegia and hemiparesis following cerebral infarction affecting left non-dominant side: Secondary | ICD-10-CM | POA: Diagnosis not present

## 2018-07-08 DIAGNOSIS — F329 Major depressive disorder, single episode, unspecified: Secondary | ICD-10-CM | POA: Diagnosis not present

## 2018-07-08 DIAGNOSIS — L89896 Pressure-induced deep tissue damage of other site: Secondary | ICD-10-CM | POA: Diagnosis not present

## 2018-07-08 DIAGNOSIS — M1990 Unspecified osteoarthritis, unspecified site: Secondary | ICD-10-CM | POA: Diagnosis not present

## 2018-07-08 DIAGNOSIS — I69391 Dysphagia following cerebral infarction: Secondary | ICD-10-CM | POA: Diagnosis not present

## 2018-07-08 DIAGNOSIS — I69320 Aphasia following cerebral infarction: Secondary | ICD-10-CM | POA: Diagnosis not present

## 2018-07-08 DIAGNOSIS — F419 Anxiety disorder, unspecified: Secondary | ICD-10-CM | POA: Diagnosis not present

## 2018-07-08 DIAGNOSIS — F209 Schizophrenia, unspecified: Secondary | ICD-10-CM | POA: Diagnosis not present

## 2018-07-09 DIAGNOSIS — F209 Schizophrenia, unspecified: Secondary | ICD-10-CM | POA: Diagnosis not present

## 2018-07-09 DIAGNOSIS — I69320 Aphasia following cerebral infarction: Secondary | ICD-10-CM | POA: Diagnosis not present

## 2018-07-09 DIAGNOSIS — F329 Major depressive disorder, single episode, unspecified: Secondary | ICD-10-CM | POA: Diagnosis not present

## 2018-07-09 DIAGNOSIS — I69391 Dysphagia following cerebral infarction: Secondary | ICD-10-CM | POA: Diagnosis not present

## 2018-07-09 DIAGNOSIS — S91302A Unspecified open wound, left foot, initial encounter: Secondary | ICD-10-CM | POA: Diagnosis not present

## 2018-07-09 DIAGNOSIS — I69354 Hemiplegia and hemiparesis following cerebral infarction affecting left non-dominant side: Secondary | ICD-10-CM | POA: Diagnosis not present

## 2018-07-09 DIAGNOSIS — F419 Anxiety disorder, unspecified: Secondary | ICD-10-CM | POA: Diagnosis not present

## 2018-07-10 DIAGNOSIS — F419 Anxiety disorder, unspecified: Secondary | ICD-10-CM | POA: Diagnosis not present

## 2018-07-10 DIAGNOSIS — I69391 Dysphagia following cerebral infarction: Secondary | ICD-10-CM | POA: Diagnosis not present

## 2018-07-10 DIAGNOSIS — I69354 Hemiplegia and hemiparesis following cerebral infarction affecting left non-dominant side: Secondary | ICD-10-CM | POA: Diagnosis not present

## 2018-07-10 DIAGNOSIS — F329 Major depressive disorder, single episode, unspecified: Secondary | ICD-10-CM | POA: Diagnosis not present

## 2018-07-10 DIAGNOSIS — I69320 Aphasia following cerebral infarction: Secondary | ICD-10-CM | POA: Diagnosis not present

## 2018-07-10 DIAGNOSIS — F209 Schizophrenia, unspecified: Secondary | ICD-10-CM | POA: Diagnosis not present

## 2018-07-11 DIAGNOSIS — F209 Schizophrenia, unspecified: Secondary | ICD-10-CM | POA: Diagnosis not present

## 2018-07-11 DIAGNOSIS — I69320 Aphasia following cerebral infarction: Secondary | ICD-10-CM | POA: Diagnosis not present

## 2018-07-11 DIAGNOSIS — I69354 Hemiplegia and hemiparesis following cerebral infarction affecting left non-dominant side: Secondary | ICD-10-CM | POA: Diagnosis not present

## 2018-07-11 DIAGNOSIS — F419 Anxiety disorder, unspecified: Secondary | ICD-10-CM | POA: Diagnosis not present

## 2018-07-11 DIAGNOSIS — I69391 Dysphagia following cerebral infarction: Secondary | ICD-10-CM | POA: Diagnosis not present

## 2018-07-11 DIAGNOSIS — F329 Major depressive disorder, single episode, unspecified: Secondary | ICD-10-CM | POA: Diagnosis not present

## 2018-07-14 DIAGNOSIS — F329 Major depressive disorder, single episode, unspecified: Secondary | ICD-10-CM | POA: Diagnosis not present

## 2018-07-14 DIAGNOSIS — F419 Anxiety disorder, unspecified: Secondary | ICD-10-CM | POA: Diagnosis not present

## 2018-07-14 DIAGNOSIS — F209 Schizophrenia, unspecified: Secondary | ICD-10-CM | POA: Diagnosis not present

## 2018-07-14 DIAGNOSIS — I69391 Dysphagia following cerebral infarction: Secondary | ICD-10-CM | POA: Diagnosis not present

## 2018-07-14 DIAGNOSIS — I69354 Hemiplegia and hemiparesis following cerebral infarction affecting left non-dominant side: Secondary | ICD-10-CM | POA: Diagnosis not present

## 2018-07-14 DIAGNOSIS — I69320 Aphasia following cerebral infarction: Secondary | ICD-10-CM | POA: Diagnosis not present

## 2018-07-15 DIAGNOSIS — F329 Major depressive disorder, single episode, unspecified: Secondary | ICD-10-CM | POA: Diagnosis not present

## 2018-07-15 DIAGNOSIS — L89526 Pressure-induced deep tissue damage of left ankle: Secondary | ICD-10-CM | POA: Diagnosis not present

## 2018-07-15 DIAGNOSIS — I69320 Aphasia following cerebral infarction: Secondary | ICD-10-CM | POA: Diagnosis not present

## 2018-07-15 DIAGNOSIS — M1990 Unspecified osteoarthritis, unspecified site: Secondary | ICD-10-CM | POA: Diagnosis not present

## 2018-07-15 DIAGNOSIS — F419 Anxiety disorder, unspecified: Secondary | ICD-10-CM | POA: Diagnosis not present

## 2018-07-15 DIAGNOSIS — L89626 Pressure-induced deep tissue damage of left heel: Secondary | ICD-10-CM | POA: Diagnosis not present

## 2018-07-15 DIAGNOSIS — L89896 Pressure-induced deep tissue damage of other site: Secondary | ICD-10-CM | POA: Diagnosis not present

## 2018-07-15 DIAGNOSIS — F209 Schizophrenia, unspecified: Secondary | ICD-10-CM | POA: Diagnosis not present

## 2018-07-15 DIAGNOSIS — I69354 Hemiplegia and hemiparesis following cerebral infarction affecting left non-dominant side: Secondary | ICD-10-CM | POA: Diagnosis not present

## 2018-07-15 DIAGNOSIS — I69391 Dysphagia following cerebral infarction: Secondary | ICD-10-CM | POA: Diagnosis not present

## 2018-07-16 DIAGNOSIS — I69391 Dysphagia following cerebral infarction: Secondary | ICD-10-CM | POA: Diagnosis not present

## 2018-07-16 DIAGNOSIS — I69354 Hemiplegia and hemiparesis following cerebral infarction affecting left non-dominant side: Secondary | ICD-10-CM | POA: Diagnosis not present

## 2018-07-16 DIAGNOSIS — F419 Anxiety disorder, unspecified: Secondary | ICD-10-CM | POA: Diagnosis not present

## 2018-07-16 DIAGNOSIS — F209 Schizophrenia, unspecified: Secondary | ICD-10-CM | POA: Diagnosis not present

## 2018-07-16 DIAGNOSIS — I69320 Aphasia following cerebral infarction: Secondary | ICD-10-CM | POA: Diagnosis not present

## 2018-07-16 DIAGNOSIS — F329 Major depressive disorder, single episode, unspecified: Secondary | ICD-10-CM | POA: Diagnosis not present

## 2018-07-17 DIAGNOSIS — F329 Major depressive disorder, single episode, unspecified: Secondary | ICD-10-CM | POA: Diagnosis not present

## 2018-07-17 DIAGNOSIS — I69391 Dysphagia following cerebral infarction: Secondary | ICD-10-CM | POA: Diagnosis not present

## 2018-07-17 DIAGNOSIS — F419 Anxiety disorder, unspecified: Secondary | ICD-10-CM | POA: Diagnosis not present

## 2018-07-17 DIAGNOSIS — I69320 Aphasia following cerebral infarction: Secondary | ICD-10-CM | POA: Diagnosis not present

## 2018-07-17 DIAGNOSIS — I69354 Hemiplegia and hemiparesis following cerebral infarction affecting left non-dominant side: Secondary | ICD-10-CM | POA: Diagnosis not present

## 2018-07-17 DIAGNOSIS — F209 Schizophrenia, unspecified: Secondary | ICD-10-CM | POA: Diagnosis not present

## 2018-07-18 DIAGNOSIS — I69391 Dysphagia following cerebral infarction: Secondary | ICD-10-CM | POA: Diagnosis not present

## 2018-07-18 DIAGNOSIS — I69320 Aphasia following cerebral infarction: Secondary | ICD-10-CM | POA: Diagnosis not present

## 2018-07-18 DIAGNOSIS — F329 Major depressive disorder, single episode, unspecified: Secondary | ICD-10-CM | POA: Diagnosis not present

## 2018-07-18 DIAGNOSIS — F419 Anxiety disorder, unspecified: Secondary | ICD-10-CM | POA: Diagnosis not present

## 2018-07-18 DIAGNOSIS — F209 Schizophrenia, unspecified: Secondary | ICD-10-CM | POA: Diagnosis not present

## 2018-07-18 DIAGNOSIS — I69354 Hemiplegia and hemiparesis following cerebral infarction affecting left non-dominant side: Secondary | ICD-10-CM | POA: Diagnosis not present

## 2018-07-21 DIAGNOSIS — I69391 Dysphagia following cerebral infarction: Secondary | ICD-10-CM | POA: Diagnosis not present

## 2018-07-21 DIAGNOSIS — I69320 Aphasia following cerebral infarction: Secondary | ICD-10-CM | POA: Diagnosis not present

## 2018-07-21 DIAGNOSIS — F329 Major depressive disorder, single episode, unspecified: Secondary | ICD-10-CM | POA: Diagnosis not present

## 2018-07-21 DIAGNOSIS — F209 Schizophrenia, unspecified: Secondary | ICD-10-CM | POA: Diagnosis not present

## 2018-07-21 DIAGNOSIS — F419 Anxiety disorder, unspecified: Secondary | ICD-10-CM | POA: Diagnosis not present

## 2018-07-21 DIAGNOSIS — I69354 Hemiplegia and hemiparesis following cerebral infarction affecting left non-dominant side: Secondary | ICD-10-CM | POA: Diagnosis not present

## 2018-07-22 DIAGNOSIS — I69320 Aphasia following cerebral infarction: Secondary | ICD-10-CM | POA: Diagnosis not present

## 2018-07-22 DIAGNOSIS — L89896 Pressure-induced deep tissue damage of other site: Secondary | ICD-10-CM | POA: Diagnosis not present

## 2018-07-22 DIAGNOSIS — F329 Major depressive disorder, single episode, unspecified: Secondary | ICD-10-CM | POA: Diagnosis not present

## 2018-07-22 DIAGNOSIS — F209 Schizophrenia, unspecified: Secondary | ICD-10-CM | POA: Diagnosis not present

## 2018-07-22 DIAGNOSIS — L89626 Pressure-induced deep tissue damage of left heel: Secondary | ICD-10-CM | POA: Diagnosis not present

## 2018-07-22 DIAGNOSIS — F419 Anxiety disorder, unspecified: Secondary | ICD-10-CM | POA: Diagnosis not present

## 2018-07-22 DIAGNOSIS — L89526 Pressure-induced deep tissue damage of left ankle: Secondary | ICD-10-CM | POA: Diagnosis not present

## 2018-07-22 DIAGNOSIS — I69354 Hemiplegia and hemiparesis following cerebral infarction affecting left non-dominant side: Secondary | ICD-10-CM | POA: Diagnosis not present

## 2018-07-22 DIAGNOSIS — I69391 Dysphagia following cerebral infarction: Secondary | ICD-10-CM | POA: Diagnosis not present

## 2018-07-23 DIAGNOSIS — I69354 Hemiplegia and hemiparesis following cerebral infarction affecting left non-dominant side: Secondary | ICD-10-CM | POA: Diagnosis not present

## 2018-07-23 DIAGNOSIS — I69320 Aphasia following cerebral infarction: Secondary | ICD-10-CM | POA: Diagnosis not present

## 2018-07-23 DIAGNOSIS — I69391 Dysphagia following cerebral infarction: Secondary | ICD-10-CM | POA: Diagnosis not present

## 2018-07-23 DIAGNOSIS — F419 Anxiety disorder, unspecified: Secondary | ICD-10-CM | POA: Diagnosis not present

## 2018-07-23 DIAGNOSIS — F209 Schizophrenia, unspecified: Secondary | ICD-10-CM | POA: Diagnosis not present

## 2018-07-23 DIAGNOSIS — F329 Major depressive disorder, single episode, unspecified: Secondary | ICD-10-CM | POA: Diagnosis not present

## 2018-07-24 DIAGNOSIS — I69354 Hemiplegia and hemiparesis following cerebral infarction affecting left non-dominant side: Secondary | ICD-10-CM | POA: Diagnosis not present

## 2018-07-24 DIAGNOSIS — I69320 Aphasia following cerebral infarction: Secondary | ICD-10-CM | POA: Diagnosis not present

## 2018-07-24 DIAGNOSIS — I69391 Dysphagia following cerebral infarction: Secondary | ICD-10-CM | POA: Diagnosis not present

## 2018-07-24 DIAGNOSIS — F419 Anxiety disorder, unspecified: Secondary | ICD-10-CM | POA: Diagnosis not present

## 2018-07-24 DIAGNOSIS — F329 Major depressive disorder, single episode, unspecified: Secondary | ICD-10-CM | POA: Diagnosis not present

## 2018-07-24 DIAGNOSIS — F209 Schizophrenia, unspecified: Secondary | ICD-10-CM | POA: Diagnosis not present

## 2018-07-25 DIAGNOSIS — I69320 Aphasia following cerebral infarction: Secondary | ICD-10-CM | POA: Diagnosis not present

## 2018-07-25 DIAGNOSIS — I69391 Dysphagia following cerebral infarction: Secondary | ICD-10-CM | POA: Diagnosis not present

## 2018-07-25 DIAGNOSIS — I69354 Hemiplegia and hemiparesis following cerebral infarction affecting left non-dominant side: Secondary | ICD-10-CM | POA: Diagnosis not present

## 2018-07-25 DIAGNOSIS — F209 Schizophrenia, unspecified: Secondary | ICD-10-CM | POA: Diagnosis not present

## 2018-07-25 DIAGNOSIS — F329 Major depressive disorder, single episode, unspecified: Secondary | ICD-10-CM | POA: Diagnosis not present

## 2018-07-25 DIAGNOSIS — F419 Anxiety disorder, unspecified: Secondary | ICD-10-CM | POA: Diagnosis not present

## 2018-07-28 DIAGNOSIS — F419 Anxiety disorder, unspecified: Secondary | ICD-10-CM | POA: Diagnosis not present

## 2018-07-28 DIAGNOSIS — F329 Major depressive disorder, single episode, unspecified: Secondary | ICD-10-CM | POA: Diagnosis not present

## 2018-07-28 DIAGNOSIS — I69391 Dysphagia following cerebral infarction: Secondary | ICD-10-CM | POA: Diagnosis not present

## 2018-07-28 DIAGNOSIS — I69354 Hemiplegia and hemiparesis following cerebral infarction affecting left non-dominant side: Secondary | ICD-10-CM | POA: Diagnosis not present

## 2018-07-28 DIAGNOSIS — I69320 Aphasia following cerebral infarction: Secondary | ICD-10-CM | POA: Diagnosis not present

## 2018-07-28 DIAGNOSIS — F209 Schizophrenia, unspecified: Secondary | ICD-10-CM | POA: Diagnosis not present

## 2018-07-29 DIAGNOSIS — F329 Major depressive disorder, single episode, unspecified: Secondary | ICD-10-CM | POA: Diagnosis not present

## 2018-07-29 DIAGNOSIS — G8194 Hemiplegia, unspecified affecting left nondominant side: Secondary | ICD-10-CM

## 2018-07-29 DIAGNOSIS — L89620 Pressure ulcer of left heel, unstageable: Secondary | ICD-10-CM | POA: Diagnosis not present

## 2018-07-29 DIAGNOSIS — J439 Emphysema, unspecified: Secondary | ICD-10-CM

## 2018-07-29 DIAGNOSIS — I69354 Hemiplegia and hemiparesis following cerebral infarction affecting left non-dominant side: Secondary | ICD-10-CM | POA: Diagnosis not present

## 2018-07-29 DIAGNOSIS — L89526 Pressure-induced deep tissue damage of left ankle: Secondary | ICD-10-CM | POA: Diagnosis not present

## 2018-07-29 DIAGNOSIS — G40509 Epileptic seizures related to external causes, not intractable, without status epilepticus: Secondary | ICD-10-CM | POA: Diagnosis not present

## 2018-07-29 DIAGNOSIS — I69391 Dysphagia following cerebral infarction: Secondary | ICD-10-CM | POA: Diagnosis not present

## 2018-07-29 DIAGNOSIS — F200 Paranoid schizophrenia: Secondary | ICD-10-CM | POA: Diagnosis not present

## 2018-07-29 DIAGNOSIS — F1120 Opioid dependence, uncomplicated: Secondary | ICD-10-CM

## 2018-07-29 DIAGNOSIS — F419 Anxiety disorder, unspecified: Secondary | ICD-10-CM | POA: Diagnosis not present

## 2018-07-29 DIAGNOSIS — I69320 Aphasia following cerebral infarction: Secondary | ICD-10-CM | POA: Diagnosis not present

## 2018-07-29 DIAGNOSIS — L89896 Pressure-induced deep tissue damage of other site: Secondary | ICD-10-CM | POA: Diagnosis not present

## 2018-07-29 DIAGNOSIS — L89626 Pressure-induced deep tissue damage of left heel: Secondary | ICD-10-CM | POA: Diagnosis not present

## 2018-07-29 DIAGNOSIS — F209 Schizophrenia, unspecified: Secondary | ICD-10-CM | POA: Diagnosis not present

## 2018-07-30 DIAGNOSIS — I69354 Hemiplegia and hemiparesis following cerebral infarction affecting left non-dominant side: Secondary | ICD-10-CM | POA: Diagnosis not present

## 2018-07-30 DIAGNOSIS — F209 Schizophrenia, unspecified: Secondary | ICD-10-CM | POA: Diagnosis not present

## 2018-07-30 DIAGNOSIS — I69320 Aphasia following cerebral infarction: Secondary | ICD-10-CM | POA: Diagnosis not present

## 2018-07-30 DIAGNOSIS — F419 Anxiety disorder, unspecified: Secondary | ICD-10-CM | POA: Diagnosis not present

## 2018-07-30 DIAGNOSIS — I69391 Dysphagia following cerebral infarction: Secondary | ICD-10-CM | POA: Diagnosis not present

## 2018-07-30 DIAGNOSIS — F329 Major depressive disorder, single episode, unspecified: Secondary | ICD-10-CM | POA: Diagnosis not present

## 2018-07-31 DIAGNOSIS — I69354 Hemiplegia and hemiparesis following cerebral infarction affecting left non-dominant side: Secondary | ICD-10-CM | POA: Diagnosis not present

## 2018-07-31 DIAGNOSIS — I69320 Aphasia following cerebral infarction: Secondary | ICD-10-CM | POA: Diagnosis not present

## 2018-07-31 DIAGNOSIS — F419 Anxiety disorder, unspecified: Secondary | ICD-10-CM | POA: Diagnosis not present

## 2018-07-31 DIAGNOSIS — F329 Major depressive disorder, single episode, unspecified: Secondary | ICD-10-CM | POA: Diagnosis not present

## 2018-07-31 DIAGNOSIS — I69391 Dysphagia following cerebral infarction: Secondary | ICD-10-CM | POA: Diagnosis not present

## 2018-07-31 DIAGNOSIS — F209 Schizophrenia, unspecified: Secondary | ICD-10-CM | POA: Diagnosis not present

## 2018-08-01 DIAGNOSIS — I69391 Dysphagia following cerebral infarction: Secondary | ICD-10-CM | POA: Diagnosis not present

## 2018-08-01 DIAGNOSIS — I69320 Aphasia following cerebral infarction: Secondary | ICD-10-CM | POA: Diagnosis not present

## 2018-08-01 DIAGNOSIS — F209 Schizophrenia, unspecified: Secondary | ICD-10-CM | POA: Diagnosis not present

## 2018-08-01 DIAGNOSIS — F419 Anxiety disorder, unspecified: Secondary | ICD-10-CM | POA: Diagnosis not present

## 2018-08-01 DIAGNOSIS — I69354 Hemiplegia and hemiparesis following cerebral infarction affecting left non-dominant side: Secondary | ICD-10-CM | POA: Diagnosis not present

## 2018-08-01 DIAGNOSIS — F329 Major depressive disorder, single episode, unspecified: Secondary | ICD-10-CM | POA: Diagnosis not present

## 2018-08-02 DIAGNOSIS — Z431 Encounter for attention to gastrostomy: Secondary | ICD-10-CM | POA: Diagnosis not present

## 2018-08-02 DIAGNOSIS — R600 Localized edema: Secondary | ICD-10-CM | POA: Diagnosis not present

## 2018-08-02 DIAGNOSIS — K219 Gastro-esophageal reflux disease without esophagitis: Secondary | ICD-10-CM | POA: Diagnosis not present

## 2018-08-02 DIAGNOSIS — M21372 Foot drop, left foot: Secondary | ICD-10-CM | POA: Diagnosis not present

## 2018-08-02 DIAGNOSIS — R569 Unspecified convulsions: Secondary | ICD-10-CM | POA: Diagnosis not present

## 2018-08-02 DIAGNOSIS — R630 Anorexia: Secondary | ICD-10-CM | POA: Diagnosis not present

## 2018-08-02 DIAGNOSIS — J449 Chronic obstructive pulmonary disease, unspecified: Secondary | ICD-10-CM | POA: Diagnosis not present

## 2018-08-02 DIAGNOSIS — M21371 Foot drop, right foot: Secondary | ICD-10-CM | POA: Diagnosis not present

## 2018-08-02 DIAGNOSIS — R208 Other disturbances of skin sensation: Secondary | ICD-10-CM | POA: Diagnosis not present

## 2018-08-02 DIAGNOSIS — F329 Major depressive disorder, single episode, unspecified: Secondary | ICD-10-CM | POA: Diagnosis not present

## 2018-08-02 DIAGNOSIS — Z741 Need for assistance with personal care: Secondary | ICD-10-CM | POA: Diagnosis not present

## 2018-08-02 DIAGNOSIS — I69391 Dysphagia following cerebral infarction: Secondary | ICD-10-CM | POA: Diagnosis not present

## 2018-08-02 DIAGNOSIS — Z6821 Body mass index (BMI) 21.0-21.9, adult: Secondary | ICD-10-CM | POA: Diagnosis not present

## 2018-08-02 DIAGNOSIS — F419 Anxiety disorder, unspecified: Secondary | ICD-10-CM | POA: Diagnosis not present

## 2018-08-02 DIAGNOSIS — G8929 Other chronic pain: Secondary | ICD-10-CM | POA: Diagnosis not present

## 2018-08-02 DIAGNOSIS — I69320 Aphasia following cerebral infarction: Secondary | ICD-10-CM | POA: Diagnosis not present

## 2018-08-02 DIAGNOSIS — M24542 Contracture, left hand: Secondary | ICD-10-CM | POA: Diagnosis not present

## 2018-08-02 DIAGNOSIS — L89522 Pressure ulcer of left ankle, stage 2: Secondary | ICD-10-CM | POA: Diagnosis not present

## 2018-08-02 DIAGNOSIS — I69354 Hemiplegia and hemiparesis following cerebral infarction affecting left non-dominant side: Secondary | ICD-10-CM | POA: Diagnosis not present

## 2018-08-02 DIAGNOSIS — L89622 Pressure ulcer of left heel, stage 2: Secondary | ICD-10-CM | POA: Diagnosis not present

## 2018-08-02 DIAGNOSIS — F209 Schizophrenia, unspecified: Secondary | ICD-10-CM | POA: Diagnosis not present

## 2018-08-02 DIAGNOSIS — M810 Age-related osteoporosis without current pathological fracture: Secondary | ICD-10-CM | POA: Diagnosis not present

## 2018-08-04 DIAGNOSIS — M24542 Contracture, left hand: Secondary | ICD-10-CM | POA: Diagnosis not present

## 2018-08-04 DIAGNOSIS — J449 Chronic obstructive pulmonary disease, unspecified: Secondary | ICD-10-CM | POA: Diagnosis not present

## 2018-08-04 DIAGNOSIS — R630 Anorexia: Secondary | ICD-10-CM | POA: Diagnosis not present

## 2018-08-04 DIAGNOSIS — I69320 Aphasia following cerebral infarction: Secondary | ICD-10-CM | POA: Diagnosis not present

## 2018-08-04 DIAGNOSIS — I69354 Hemiplegia and hemiparesis following cerebral infarction affecting left non-dominant side: Secondary | ICD-10-CM | POA: Diagnosis not present

## 2018-08-04 DIAGNOSIS — I69391 Dysphagia following cerebral infarction: Secondary | ICD-10-CM | POA: Diagnosis not present

## 2018-08-05 DIAGNOSIS — J441 Chronic obstructive pulmonary disease with (acute) exacerbation: Secondary | ICD-10-CM | POA: Diagnosis not present

## 2018-08-05 DIAGNOSIS — L89526 Pressure-induced deep tissue damage of left ankle: Secondary | ICD-10-CM | POA: Diagnosis not present

## 2018-08-05 DIAGNOSIS — I69391 Dysphagia following cerebral infarction: Secondary | ICD-10-CM | POA: Diagnosis not present

## 2018-08-05 DIAGNOSIS — I69320 Aphasia following cerebral infarction: Secondary | ICD-10-CM | POA: Diagnosis not present

## 2018-08-05 DIAGNOSIS — I69354 Hemiplegia and hemiparesis following cerebral infarction affecting left non-dominant side: Secondary | ICD-10-CM | POA: Diagnosis not present

## 2018-08-05 DIAGNOSIS — M24542 Contracture, left hand: Secondary | ICD-10-CM | POA: Diagnosis not present

## 2018-08-05 DIAGNOSIS — L89626 Pressure-induced deep tissue damage of left heel: Secondary | ICD-10-CM | POA: Diagnosis not present

## 2018-08-05 DIAGNOSIS — G40909 Epilepsy, unspecified, not intractable, without status epilepticus: Secondary | ICD-10-CM | POA: Diagnosis not present

## 2018-08-05 DIAGNOSIS — J449 Chronic obstructive pulmonary disease, unspecified: Secondary | ICD-10-CM | POA: Diagnosis not present

## 2018-08-05 DIAGNOSIS — R630 Anorexia: Secondary | ICD-10-CM | POA: Diagnosis not present

## 2018-08-06 DIAGNOSIS — M24542 Contracture, left hand: Secondary | ICD-10-CM | POA: Diagnosis not present

## 2018-08-06 DIAGNOSIS — I69354 Hemiplegia and hemiparesis following cerebral infarction affecting left non-dominant side: Secondary | ICD-10-CM | POA: Diagnosis not present

## 2018-08-06 DIAGNOSIS — I69391 Dysphagia following cerebral infarction: Secondary | ICD-10-CM | POA: Diagnosis not present

## 2018-08-06 DIAGNOSIS — J449 Chronic obstructive pulmonary disease, unspecified: Secondary | ICD-10-CM | POA: Diagnosis not present

## 2018-08-06 DIAGNOSIS — I69320 Aphasia following cerebral infarction: Secondary | ICD-10-CM | POA: Diagnosis not present

## 2018-08-06 DIAGNOSIS — R630 Anorexia: Secondary | ICD-10-CM | POA: Diagnosis not present

## 2018-08-07 DIAGNOSIS — J449 Chronic obstructive pulmonary disease, unspecified: Secondary | ICD-10-CM | POA: Diagnosis not present

## 2018-08-07 DIAGNOSIS — I69391 Dysphagia following cerebral infarction: Secondary | ICD-10-CM | POA: Diagnosis not present

## 2018-08-07 DIAGNOSIS — M24542 Contracture, left hand: Secondary | ICD-10-CM | POA: Diagnosis not present

## 2018-08-07 DIAGNOSIS — I69320 Aphasia following cerebral infarction: Secondary | ICD-10-CM | POA: Diagnosis not present

## 2018-08-07 DIAGNOSIS — R630 Anorexia: Secondary | ICD-10-CM | POA: Diagnosis not present

## 2018-08-07 DIAGNOSIS — I69354 Hemiplegia and hemiparesis following cerebral infarction affecting left non-dominant side: Secondary | ICD-10-CM | POA: Diagnosis not present

## 2018-08-08 DIAGNOSIS — I69354 Hemiplegia and hemiparesis following cerebral infarction affecting left non-dominant side: Secondary | ICD-10-CM | POA: Diagnosis not present

## 2018-08-08 DIAGNOSIS — R630 Anorexia: Secondary | ICD-10-CM | POA: Diagnosis not present

## 2018-08-08 DIAGNOSIS — I69320 Aphasia following cerebral infarction: Secondary | ICD-10-CM | POA: Diagnosis not present

## 2018-08-08 DIAGNOSIS — I69391 Dysphagia following cerebral infarction: Secondary | ICD-10-CM | POA: Diagnosis not present

## 2018-08-08 DIAGNOSIS — J449 Chronic obstructive pulmonary disease, unspecified: Secondary | ICD-10-CM | POA: Diagnosis not present

## 2018-08-08 DIAGNOSIS — M24542 Contracture, left hand: Secondary | ICD-10-CM | POA: Diagnosis not present

## 2018-08-11 DIAGNOSIS — I69391 Dysphagia following cerebral infarction: Secondary | ICD-10-CM | POA: Diagnosis not present

## 2018-08-11 DIAGNOSIS — I69320 Aphasia following cerebral infarction: Secondary | ICD-10-CM | POA: Diagnosis not present

## 2018-08-11 DIAGNOSIS — J449 Chronic obstructive pulmonary disease, unspecified: Secondary | ICD-10-CM | POA: Diagnosis not present

## 2018-08-11 DIAGNOSIS — M24542 Contracture, left hand: Secondary | ICD-10-CM | POA: Diagnosis not present

## 2018-08-11 DIAGNOSIS — I69354 Hemiplegia and hemiparesis following cerebral infarction affecting left non-dominant side: Secondary | ICD-10-CM | POA: Diagnosis not present

## 2018-08-11 DIAGNOSIS — R630 Anorexia: Secondary | ICD-10-CM | POA: Diagnosis not present

## 2018-08-12 DIAGNOSIS — L89623 Pressure ulcer of left heel, stage 3: Secondary | ICD-10-CM | POA: Diagnosis not present

## 2018-08-12 DIAGNOSIS — I69354 Hemiplegia and hemiparesis following cerebral infarction affecting left non-dominant side: Secondary | ICD-10-CM | POA: Diagnosis not present

## 2018-08-12 DIAGNOSIS — L89526 Pressure-induced deep tissue damage of left ankle: Secondary | ICD-10-CM | POA: Diagnosis not present

## 2018-08-12 DIAGNOSIS — J449 Chronic obstructive pulmonary disease, unspecified: Secondary | ICD-10-CM | POA: Diagnosis not present

## 2018-08-12 DIAGNOSIS — I69320 Aphasia following cerebral infarction: Secondary | ICD-10-CM | POA: Diagnosis not present

## 2018-08-12 DIAGNOSIS — R630 Anorexia: Secondary | ICD-10-CM | POA: Diagnosis not present

## 2018-08-12 DIAGNOSIS — M24542 Contracture, left hand: Secondary | ICD-10-CM | POA: Diagnosis not present

## 2018-08-12 DIAGNOSIS — L89896 Pressure-induced deep tissue damage of other site: Secondary | ICD-10-CM | POA: Diagnosis not present

## 2018-08-12 DIAGNOSIS — I69391 Dysphagia following cerebral infarction: Secondary | ICD-10-CM | POA: Diagnosis not present

## 2018-08-12 DIAGNOSIS — L89626 Pressure-induced deep tissue damage of left heel: Secondary | ICD-10-CM | POA: Diagnosis not present

## 2018-08-13 DIAGNOSIS — M24542 Contracture, left hand: Secondary | ICD-10-CM | POA: Diagnosis not present

## 2018-08-13 DIAGNOSIS — I69320 Aphasia following cerebral infarction: Secondary | ICD-10-CM | POA: Diagnosis not present

## 2018-08-13 DIAGNOSIS — J449 Chronic obstructive pulmonary disease, unspecified: Secondary | ICD-10-CM | POA: Diagnosis not present

## 2018-08-13 DIAGNOSIS — I69354 Hemiplegia and hemiparesis following cerebral infarction affecting left non-dominant side: Secondary | ICD-10-CM | POA: Diagnosis not present

## 2018-08-13 DIAGNOSIS — I69391 Dysphagia following cerebral infarction: Secondary | ICD-10-CM | POA: Diagnosis not present

## 2018-08-13 DIAGNOSIS — R630 Anorexia: Secondary | ICD-10-CM | POA: Diagnosis not present

## 2018-08-14 DIAGNOSIS — I69391 Dysphagia following cerebral infarction: Secondary | ICD-10-CM | POA: Diagnosis not present

## 2018-08-14 DIAGNOSIS — M24542 Contracture, left hand: Secondary | ICD-10-CM | POA: Diagnosis not present

## 2018-08-14 DIAGNOSIS — R630 Anorexia: Secondary | ICD-10-CM | POA: Diagnosis not present

## 2018-08-14 DIAGNOSIS — J449 Chronic obstructive pulmonary disease, unspecified: Secondary | ICD-10-CM | POA: Diagnosis not present

## 2018-08-14 DIAGNOSIS — I69354 Hemiplegia and hemiparesis following cerebral infarction affecting left non-dominant side: Secondary | ICD-10-CM | POA: Diagnosis not present

## 2018-08-14 DIAGNOSIS — I69320 Aphasia following cerebral infarction: Secondary | ICD-10-CM | POA: Diagnosis not present

## 2018-08-15 DIAGNOSIS — R630 Anorexia: Secondary | ICD-10-CM | POA: Diagnosis not present

## 2018-08-15 DIAGNOSIS — I69320 Aphasia following cerebral infarction: Secondary | ICD-10-CM | POA: Diagnosis not present

## 2018-08-15 DIAGNOSIS — I69391 Dysphagia following cerebral infarction: Secondary | ICD-10-CM | POA: Diagnosis not present

## 2018-08-15 DIAGNOSIS — M24542 Contracture, left hand: Secondary | ICD-10-CM | POA: Diagnosis not present

## 2018-08-15 DIAGNOSIS — J449 Chronic obstructive pulmonary disease, unspecified: Secondary | ICD-10-CM | POA: Diagnosis not present

## 2018-08-15 DIAGNOSIS — I69354 Hemiplegia and hemiparesis following cerebral infarction affecting left non-dominant side: Secondary | ICD-10-CM | POA: Diagnosis not present

## 2018-08-18 DIAGNOSIS — J449 Chronic obstructive pulmonary disease, unspecified: Secondary | ICD-10-CM | POA: Diagnosis not present

## 2018-08-18 DIAGNOSIS — I69354 Hemiplegia and hemiparesis following cerebral infarction affecting left non-dominant side: Secondary | ICD-10-CM | POA: Diagnosis not present

## 2018-08-18 DIAGNOSIS — R630 Anorexia: Secondary | ICD-10-CM | POA: Diagnosis not present

## 2018-08-18 DIAGNOSIS — I69391 Dysphagia following cerebral infarction: Secondary | ICD-10-CM | POA: Diagnosis not present

## 2018-08-18 DIAGNOSIS — M24542 Contracture, left hand: Secondary | ICD-10-CM | POA: Diagnosis not present

## 2018-08-18 DIAGNOSIS — I69320 Aphasia following cerebral infarction: Secondary | ICD-10-CM | POA: Diagnosis not present

## 2018-08-19 DIAGNOSIS — I69391 Dysphagia following cerebral infarction: Secondary | ICD-10-CM | POA: Diagnosis not present

## 2018-08-19 DIAGNOSIS — L89526 Pressure-induced deep tissue damage of left ankle: Secondary | ICD-10-CM | POA: Diagnosis not present

## 2018-08-19 DIAGNOSIS — R630 Anorexia: Secondary | ICD-10-CM | POA: Diagnosis not present

## 2018-08-19 DIAGNOSIS — I69354 Hemiplegia and hemiparesis following cerebral infarction affecting left non-dominant side: Secondary | ICD-10-CM | POA: Diagnosis not present

## 2018-08-19 DIAGNOSIS — J449 Chronic obstructive pulmonary disease, unspecified: Secondary | ICD-10-CM | POA: Diagnosis not present

## 2018-08-19 DIAGNOSIS — L89626 Pressure-induced deep tissue damage of left heel: Secondary | ICD-10-CM | POA: Diagnosis not present

## 2018-08-19 DIAGNOSIS — M24542 Contracture, left hand: Secondary | ICD-10-CM | POA: Diagnosis not present

## 2018-08-19 DIAGNOSIS — L89890 Pressure ulcer of other site, unstageable: Secondary | ICD-10-CM | POA: Diagnosis not present

## 2018-08-19 DIAGNOSIS — M1990 Unspecified osteoarthritis, unspecified site: Secondary | ICD-10-CM | POA: Diagnosis not present

## 2018-08-19 DIAGNOSIS — I69320 Aphasia following cerebral infarction: Secondary | ICD-10-CM | POA: Diagnosis not present

## 2018-08-20 DIAGNOSIS — J449 Chronic obstructive pulmonary disease, unspecified: Secondary | ICD-10-CM | POA: Diagnosis not present

## 2018-08-20 DIAGNOSIS — I69391 Dysphagia following cerebral infarction: Secondary | ICD-10-CM | POA: Diagnosis not present

## 2018-08-20 DIAGNOSIS — R630 Anorexia: Secondary | ICD-10-CM | POA: Diagnosis not present

## 2018-08-20 DIAGNOSIS — I69354 Hemiplegia and hemiparesis following cerebral infarction affecting left non-dominant side: Secondary | ICD-10-CM | POA: Diagnosis not present

## 2018-08-20 DIAGNOSIS — M24542 Contracture, left hand: Secondary | ICD-10-CM | POA: Diagnosis not present

## 2018-08-20 DIAGNOSIS — I69320 Aphasia following cerebral infarction: Secondary | ICD-10-CM | POA: Diagnosis not present

## 2018-08-21 DIAGNOSIS — I69320 Aphasia following cerebral infarction: Secondary | ICD-10-CM | POA: Diagnosis not present

## 2018-08-21 DIAGNOSIS — R630 Anorexia: Secondary | ICD-10-CM | POA: Diagnosis not present

## 2018-08-21 DIAGNOSIS — M24542 Contracture, left hand: Secondary | ICD-10-CM | POA: Diagnosis not present

## 2018-08-21 DIAGNOSIS — J449 Chronic obstructive pulmonary disease, unspecified: Secondary | ICD-10-CM | POA: Diagnosis not present

## 2018-08-21 DIAGNOSIS — I69391 Dysphagia following cerebral infarction: Secondary | ICD-10-CM | POA: Diagnosis not present

## 2018-08-21 DIAGNOSIS — I69354 Hemiplegia and hemiparesis following cerebral infarction affecting left non-dominant side: Secondary | ICD-10-CM | POA: Diagnosis not present

## 2018-08-22 DIAGNOSIS — I69391 Dysphagia following cerebral infarction: Secondary | ICD-10-CM | POA: Diagnosis not present

## 2018-08-22 DIAGNOSIS — M24542 Contracture, left hand: Secondary | ICD-10-CM | POA: Diagnosis not present

## 2018-08-22 DIAGNOSIS — R630 Anorexia: Secondary | ICD-10-CM | POA: Diagnosis not present

## 2018-08-22 DIAGNOSIS — I69354 Hemiplegia and hemiparesis following cerebral infarction affecting left non-dominant side: Secondary | ICD-10-CM | POA: Diagnosis not present

## 2018-08-22 DIAGNOSIS — I69320 Aphasia following cerebral infarction: Secondary | ICD-10-CM | POA: Diagnosis not present

## 2018-08-22 DIAGNOSIS — B351 Tinea unguium: Secondary | ICD-10-CM | POA: Diagnosis not present

## 2018-08-22 DIAGNOSIS — J449 Chronic obstructive pulmonary disease, unspecified: Secondary | ICD-10-CM | POA: Diagnosis not present

## 2018-08-25 DIAGNOSIS — I69391 Dysphagia following cerebral infarction: Secondary | ICD-10-CM | POA: Diagnosis not present

## 2018-08-25 DIAGNOSIS — I69320 Aphasia following cerebral infarction: Secondary | ICD-10-CM | POA: Diagnosis not present

## 2018-08-25 DIAGNOSIS — R630 Anorexia: Secondary | ICD-10-CM | POA: Diagnosis not present

## 2018-08-25 DIAGNOSIS — I69354 Hemiplegia and hemiparesis following cerebral infarction affecting left non-dominant side: Secondary | ICD-10-CM | POA: Diagnosis not present

## 2018-08-25 DIAGNOSIS — M24542 Contracture, left hand: Secondary | ICD-10-CM | POA: Diagnosis not present

## 2018-08-25 DIAGNOSIS — J449 Chronic obstructive pulmonary disease, unspecified: Secondary | ICD-10-CM | POA: Diagnosis not present

## 2018-08-26 DIAGNOSIS — R630 Anorexia: Secondary | ICD-10-CM | POA: Diagnosis not present

## 2018-08-26 DIAGNOSIS — I69391 Dysphagia following cerebral infarction: Secondary | ICD-10-CM | POA: Diagnosis not present

## 2018-08-26 DIAGNOSIS — I69320 Aphasia following cerebral infarction: Secondary | ICD-10-CM | POA: Diagnosis not present

## 2018-08-26 DIAGNOSIS — M24542 Contracture, left hand: Secondary | ICD-10-CM | POA: Diagnosis not present

## 2018-08-26 DIAGNOSIS — J449 Chronic obstructive pulmonary disease, unspecified: Secondary | ICD-10-CM | POA: Diagnosis not present

## 2018-08-26 DIAGNOSIS — I69354 Hemiplegia and hemiparesis following cerebral infarction affecting left non-dominant side: Secondary | ICD-10-CM | POA: Diagnosis not present

## 2018-08-27 DIAGNOSIS — I69391 Dysphagia following cerebral infarction: Secondary | ICD-10-CM | POA: Diagnosis not present

## 2018-08-27 DIAGNOSIS — M24542 Contracture, left hand: Secondary | ICD-10-CM | POA: Diagnosis not present

## 2018-08-27 DIAGNOSIS — R630 Anorexia: Secondary | ICD-10-CM | POA: Diagnosis not present

## 2018-08-27 DIAGNOSIS — I69354 Hemiplegia and hemiparesis following cerebral infarction affecting left non-dominant side: Secondary | ICD-10-CM | POA: Diagnosis not present

## 2018-08-27 DIAGNOSIS — I69320 Aphasia following cerebral infarction: Secondary | ICD-10-CM | POA: Diagnosis not present

## 2018-08-27 DIAGNOSIS — J449 Chronic obstructive pulmonary disease, unspecified: Secondary | ICD-10-CM | POA: Diagnosis not present

## 2018-08-28 DIAGNOSIS — I69391 Dysphagia following cerebral infarction: Secondary | ICD-10-CM | POA: Diagnosis not present

## 2018-08-28 DIAGNOSIS — M24542 Contracture, left hand: Secondary | ICD-10-CM | POA: Diagnosis not present

## 2018-08-28 DIAGNOSIS — J449 Chronic obstructive pulmonary disease, unspecified: Secondary | ICD-10-CM | POA: Diagnosis not present

## 2018-08-28 DIAGNOSIS — I69354 Hemiplegia and hemiparesis following cerebral infarction affecting left non-dominant side: Secondary | ICD-10-CM | POA: Diagnosis not present

## 2018-08-28 DIAGNOSIS — I69320 Aphasia following cerebral infarction: Secondary | ICD-10-CM | POA: Diagnosis not present

## 2018-08-28 DIAGNOSIS — R630 Anorexia: Secondary | ICD-10-CM | POA: Diagnosis not present

## 2018-08-29 DIAGNOSIS — R630 Anorexia: Secondary | ICD-10-CM | POA: Diagnosis not present

## 2018-08-29 DIAGNOSIS — I69320 Aphasia following cerebral infarction: Secondary | ICD-10-CM | POA: Diagnosis not present

## 2018-08-29 DIAGNOSIS — M24542 Contracture, left hand: Secondary | ICD-10-CM | POA: Diagnosis not present

## 2018-08-29 DIAGNOSIS — I69391 Dysphagia following cerebral infarction: Secondary | ICD-10-CM | POA: Diagnosis not present

## 2018-08-29 DIAGNOSIS — I69354 Hemiplegia and hemiparesis following cerebral infarction affecting left non-dominant side: Secondary | ICD-10-CM | POA: Diagnosis not present

## 2018-08-29 DIAGNOSIS — J449 Chronic obstructive pulmonary disease, unspecified: Secondary | ICD-10-CM | POA: Diagnosis not present

## 2018-08-31 DIAGNOSIS — F419 Anxiety disorder, unspecified: Secondary | ICD-10-CM | POA: Diagnosis not present

## 2018-08-31 DIAGNOSIS — M24542 Contracture, left hand: Secondary | ICD-10-CM | POA: Diagnosis not present

## 2018-08-31 DIAGNOSIS — Z6820 Body mass index (BMI) 20.0-20.9, adult: Secondary | ICD-10-CM | POA: Diagnosis not present

## 2018-08-31 DIAGNOSIS — J449 Chronic obstructive pulmonary disease, unspecified: Secondary | ICD-10-CM | POA: Diagnosis not present

## 2018-08-31 DIAGNOSIS — R627 Adult failure to thrive: Secondary | ICD-10-CM | POA: Diagnosis not present

## 2018-08-31 DIAGNOSIS — L89622 Pressure ulcer of left heel, stage 2: Secondary | ICD-10-CM | POA: Diagnosis not present

## 2018-08-31 DIAGNOSIS — R600 Localized edema: Secondary | ICD-10-CM | POA: Diagnosis not present

## 2018-08-31 DIAGNOSIS — I69320 Aphasia following cerebral infarction: Secondary | ICD-10-CM | POA: Diagnosis not present

## 2018-08-31 DIAGNOSIS — Z741 Need for assistance with personal care: Secondary | ICD-10-CM | POA: Diagnosis not present

## 2018-08-31 DIAGNOSIS — K219 Gastro-esophageal reflux disease without esophagitis: Secondary | ICD-10-CM | POA: Diagnosis not present

## 2018-08-31 DIAGNOSIS — I69391 Dysphagia following cerebral infarction: Secondary | ICD-10-CM | POA: Diagnosis not present

## 2018-08-31 DIAGNOSIS — F209 Schizophrenia, unspecified: Secondary | ICD-10-CM | POA: Diagnosis not present

## 2018-08-31 DIAGNOSIS — Z431 Encounter for attention to gastrostomy: Secondary | ICD-10-CM | POA: Diagnosis not present

## 2018-08-31 DIAGNOSIS — R569 Unspecified convulsions: Secondary | ICD-10-CM | POA: Diagnosis not present

## 2018-08-31 DIAGNOSIS — Z7401 Bed confinement status: Secondary | ICD-10-CM | POA: Diagnosis not present

## 2018-08-31 DIAGNOSIS — M21372 Foot drop, left foot: Secondary | ICD-10-CM | POA: Diagnosis not present

## 2018-08-31 DIAGNOSIS — M810 Age-related osteoporosis without current pathological fracture: Secondary | ICD-10-CM | POA: Diagnosis not present

## 2018-08-31 DIAGNOSIS — G8929 Other chronic pain: Secondary | ICD-10-CM | POA: Diagnosis not present

## 2018-08-31 DIAGNOSIS — R630 Anorexia: Secondary | ICD-10-CM | POA: Diagnosis not present

## 2018-08-31 DIAGNOSIS — M21371 Foot drop, right foot: Secondary | ICD-10-CM | POA: Diagnosis not present

## 2018-08-31 DIAGNOSIS — F329 Major depressive disorder, single episode, unspecified: Secondary | ICD-10-CM | POA: Diagnosis not present

## 2018-08-31 DIAGNOSIS — L89522 Pressure ulcer of left ankle, stage 2: Secondary | ICD-10-CM | POA: Diagnosis not present

## 2018-08-31 DIAGNOSIS — I69354 Hemiplegia and hemiparesis following cerebral infarction affecting left non-dominant side: Secondary | ICD-10-CM | POA: Diagnosis not present

## 2018-08-31 DIAGNOSIS — R208 Other disturbances of skin sensation: Secondary | ICD-10-CM | POA: Diagnosis not present

## 2018-09-01 DIAGNOSIS — J449 Chronic obstructive pulmonary disease, unspecified: Secondary | ICD-10-CM | POA: Diagnosis not present

## 2018-09-01 DIAGNOSIS — F329 Major depressive disorder, single episode, unspecified: Secondary | ICD-10-CM | POA: Diagnosis not present

## 2018-09-01 DIAGNOSIS — I69354 Hemiplegia and hemiparesis following cerebral infarction affecting left non-dominant side: Secondary | ICD-10-CM | POA: Diagnosis not present

## 2018-09-01 DIAGNOSIS — I69391 Dysphagia following cerebral infarction: Secondary | ICD-10-CM | POA: Diagnosis not present

## 2018-09-01 DIAGNOSIS — I69320 Aphasia following cerebral infarction: Secondary | ICD-10-CM | POA: Diagnosis not present

## 2018-09-01 DIAGNOSIS — F419 Anxiety disorder, unspecified: Secondary | ICD-10-CM | POA: Diagnosis not present

## 2018-09-02 DIAGNOSIS — I69354 Hemiplegia and hemiparesis following cerebral infarction affecting left non-dominant side: Secondary | ICD-10-CM | POA: Diagnosis not present

## 2018-09-02 DIAGNOSIS — I69391 Dysphagia following cerebral infarction: Secondary | ICD-10-CM | POA: Diagnosis not present

## 2018-09-02 DIAGNOSIS — I69320 Aphasia following cerebral infarction: Secondary | ICD-10-CM | POA: Diagnosis not present

## 2018-09-02 DIAGNOSIS — F329 Major depressive disorder, single episode, unspecified: Secondary | ICD-10-CM | POA: Diagnosis not present

## 2018-09-02 DIAGNOSIS — J449 Chronic obstructive pulmonary disease, unspecified: Secondary | ICD-10-CM | POA: Diagnosis not present

## 2018-09-02 DIAGNOSIS — F419 Anxiety disorder, unspecified: Secondary | ICD-10-CM | POA: Diagnosis not present

## 2018-09-03 DIAGNOSIS — F419 Anxiety disorder, unspecified: Secondary | ICD-10-CM | POA: Diagnosis not present

## 2018-09-03 DIAGNOSIS — F329 Major depressive disorder, single episode, unspecified: Secondary | ICD-10-CM | POA: Diagnosis not present

## 2018-09-03 DIAGNOSIS — J449 Chronic obstructive pulmonary disease, unspecified: Secondary | ICD-10-CM | POA: Diagnosis not present

## 2018-09-03 DIAGNOSIS — I69354 Hemiplegia and hemiparesis following cerebral infarction affecting left non-dominant side: Secondary | ICD-10-CM | POA: Diagnosis not present

## 2018-09-03 DIAGNOSIS — I69320 Aphasia following cerebral infarction: Secondary | ICD-10-CM | POA: Diagnosis not present

## 2018-09-03 DIAGNOSIS — I69391 Dysphagia following cerebral infarction: Secondary | ICD-10-CM | POA: Diagnosis not present

## 2018-09-04 DIAGNOSIS — F419 Anxiety disorder, unspecified: Secondary | ICD-10-CM | POA: Diagnosis not present

## 2018-09-04 DIAGNOSIS — I69354 Hemiplegia and hemiparesis following cerebral infarction affecting left non-dominant side: Secondary | ICD-10-CM | POA: Diagnosis not present

## 2018-09-04 DIAGNOSIS — I69320 Aphasia following cerebral infarction: Secondary | ICD-10-CM | POA: Diagnosis not present

## 2018-09-04 DIAGNOSIS — I69391 Dysphagia following cerebral infarction: Secondary | ICD-10-CM | POA: Diagnosis not present

## 2018-09-04 DIAGNOSIS — J449 Chronic obstructive pulmonary disease, unspecified: Secondary | ICD-10-CM | POA: Diagnosis not present

## 2018-09-04 DIAGNOSIS — F329 Major depressive disorder, single episode, unspecified: Secondary | ICD-10-CM | POA: Diagnosis not present

## 2018-09-05 DIAGNOSIS — I69354 Hemiplegia and hemiparesis following cerebral infarction affecting left non-dominant side: Secondary | ICD-10-CM | POA: Diagnosis not present

## 2018-09-05 DIAGNOSIS — I69391 Dysphagia following cerebral infarction: Secondary | ICD-10-CM | POA: Diagnosis not present

## 2018-09-05 DIAGNOSIS — I69320 Aphasia following cerebral infarction: Secondary | ICD-10-CM | POA: Diagnosis not present

## 2018-09-05 DIAGNOSIS — F419 Anxiety disorder, unspecified: Secondary | ICD-10-CM | POA: Diagnosis not present

## 2018-09-05 DIAGNOSIS — J449 Chronic obstructive pulmonary disease, unspecified: Secondary | ICD-10-CM | POA: Diagnosis not present

## 2018-09-05 DIAGNOSIS — F329 Major depressive disorder, single episode, unspecified: Secondary | ICD-10-CM | POA: Diagnosis not present

## 2018-09-08 DIAGNOSIS — J449 Chronic obstructive pulmonary disease, unspecified: Secondary | ICD-10-CM | POA: Diagnosis not present

## 2018-09-08 DIAGNOSIS — I69320 Aphasia following cerebral infarction: Secondary | ICD-10-CM | POA: Diagnosis not present

## 2018-09-08 DIAGNOSIS — F329 Major depressive disorder, single episode, unspecified: Secondary | ICD-10-CM | POA: Diagnosis not present

## 2018-09-08 DIAGNOSIS — I69391 Dysphagia following cerebral infarction: Secondary | ICD-10-CM | POA: Diagnosis not present

## 2018-09-08 DIAGNOSIS — F419 Anxiety disorder, unspecified: Secondary | ICD-10-CM | POA: Diagnosis not present

## 2018-09-08 DIAGNOSIS — I69354 Hemiplegia and hemiparesis following cerebral infarction affecting left non-dominant side: Secondary | ICD-10-CM | POA: Diagnosis not present

## 2018-09-09 DIAGNOSIS — I69320 Aphasia following cerebral infarction: Secondary | ICD-10-CM | POA: Diagnosis not present

## 2018-09-09 DIAGNOSIS — F419 Anxiety disorder, unspecified: Secondary | ICD-10-CM | POA: Diagnosis not present

## 2018-09-09 DIAGNOSIS — I69391 Dysphagia following cerebral infarction: Secondary | ICD-10-CM | POA: Diagnosis not present

## 2018-09-09 DIAGNOSIS — F329 Major depressive disorder, single episode, unspecified: Secondary | ICD-10-CM | POA: Diagnosis not present

## 2018-09-09 DIAGNOSIS — I69354 Hemiplegia and hemiparesis following cerebral infarction affecting left non-dominant side: Secondary | ICD-10-CM | POA: Diagnosis not present

## 2018-09-09 DIAGNOSIS — J449 Chronic obstructive pulmonary disease, unspecified: Secondary | ICD-10-CM | POA: Diagnosis not present

## 2018-09-10 DIAGNOSIS — I69391 Dysphagia following cerebral infarction: Secondary | ICD-10-CM | POA: Diagnosis not present

## 2018-09-10 DIAGNOSIS — J449 Chronic obstructive pulmonary disease, unspecified: Secondary | ICD-10-CM | POA: Diagnosis not present

## 2018-09-10 DIAGNOSIS — I69320 Aphasia following cerebral infarction: Secondary | ICD-10-CM | POA: Diagnosis not present

## 2018-09-10 DIAGNOSIS — F329 Major depressive disorder, single episode, unspecified: Secondary | ICD-10-CM | POA: Diagnosis not present

## 2018-09-10 DIAGNOSIS — F419 Anxiety disorder, unspecified: Secondary | ICD-10-CM | POA: Diagnosis not present

## 2018-09-10 DIAGNOSIS — I69354 Hemiplegia and hemiparesis following cerebral infarction affecting left non-dominant side: Secondary | ICD-10-CM | POA: Diagnosis not present

## 2018-09-11 DIAGNOSIS — I69320 Aphasia following cerebral infarction: Secondary | ICD-10-CM | POA: Diagnosis not present

## 2018-09-11 DIAGNOSIS — I69391 Dysphagia following cerebral infarction: Secondary | ICD-10-CM | POA: Diagnosis not present

## 2018-09-11 DIAGNOSIS — J449 Chronic obstructive pulmonary disease, unspecified: Secondary | ICD-10-CM | POA: Diagnosis not present

## 2018-09-11 DIAGNOSIS — F329 Major depressive disorder, single episode, unspecified: Secondary | ICD-10-CM | POA: Diagnosis not present

## 2018-09-11 DIAGNOSIS — I69354 Hemiplegia and hemiparesis following cerebral infarction affecting left non-dominant side: Secondary | ICD-10-CM | POA: Diagnosis not present

## 2018-09-11 DIAGNOSIS — F419 Anxiety disorder, unspecified: Secondary | ICD-10-CM | POA: Diagnosis not present

## 2018-09-12 DIAGNOSIS — J449 Chronic obstructive pulmonary disease, unspecified: Secondary | ICD-10-CM | POA: Diagnosis not present

## 2018-09-12 DIAGNOSIS — I69320 Aphasia following cerebral infarction: Secondary | ICD-10-CM | POA: Diagnosis not present

## 2018-09-12 DIAGNOSIS — I69354 Hemiplegia and hemiparesis following cerebral infarction affecting left non-dominant side: Secondary | ICD-10-CM | POA: Diagnosis not present

## 2018-09-12 DIAGNOSIS — F419 Anxiety disorder, unspecified: Secondary | ICD-10-CM | POA: Diagnosis not present

## 2018-09-12 DIAGNOSIS — I69391 Dysphagia following cerebral infarction: Secondary | ICD-10-CM | POA: Diagnosis not present

## 2018-09-12 DIAGNOSIS — F329 Major depressive disorder, single episode, unspecified: Secondary | ICD-10-CM | POA: Diagnosis not present

## 2018-09-15 DIAGNOSIS — I69354 Hemiplegia and hemiparesis following cerebral infarction affecting left non-dominant side: Secondary | ICD-10-CM | POA: Diagnosis not present

## 2018-09-15 DIAGNOSIS — I69391 Dysphagia following cerebral infarction: Secondary | ICD-10-CM | POA: Diagnosis not present

## 2018-09-15 DIAGNOSIS — I69320 Aphasia following cerebral infarction: Secondary | ICD-10-CM | POA: Diagnosis not present

## 2018-09-15 DIAGNOSIS — F419 Anxiety disorder, unspecified: Secondary | ICD-10-CM | POA: Diagnosis not present

## 2018-09-15 DIAGNOSIS — J449 Chronic obstructive pulmonary disease, unspecified: Secondary | ICD-10-CM | POA: Diagnosis not present

## 2018-09-15 DIAGNOSIS — F329 Major depressive disorder, single episode, unspecified: Secondary | ICD-10-CM | POA: Diagnosis not present

## 2018-09-16 DIAGNOSIS — I69391 Dysphagia following cerebral infarction: Secondary | ICD-10-CM | POA: Diagnosis not present

## 2018-09-16 DIAGNOSIS — J449 Chronic obstructive pulmonary disease, unspecified: Secondary | ICD-10-CM | POA: Diagnosis not present

## 2018-09-16 DIAGNOSIS — I69320 Aphasia following cerebral infarction: Secondary | ICD-10-CM | POA: Diagnosis not present

## 2018-09-16 DIAGNOSIS — F419 Anxiety disorder, unspecified: Secondary | ICD-10-CM | POA: Diagnosis not present

## 2018-09-16 DIAGNOSIS — I69354 Hemiplegia and hemiparesis following cerebral infarction affecting left non-dominant side: Secondary | ICD-10-CM | POA: Diagnosis not present

## 2018-09-16 DIAGNOSIS — F329 Major depressive disorder, single episode, unspecified: Secondary | ICD-10-CM | POA: Diagnosis not present

## 2018-09-17 DIAGNOSIS — I69354 Hemiplegia and hemiparesis following cerebral infarction affecting left non-dominant side: Secondary | ICD-10-CM | POA: Diagnosis not present

## 2018-09-17 DIAGNOSIS — I69391 Dysphagia following cerebral infarction: Secondary | ICD-10-CM | POA: Diagnosis not present

## 2018-09-17 DIAGNOSIS — J449 Chronic obstructive pulmonary disease, unspecified: Secondary | ICD-10-CM | POA: Diagnosis not present

## 2018-09-17 DIAGNOSIS — I69320 Aphasia following cerebral infarction: Secondary | ICD-10-CM | POA: Diagnosis not present

## 2018-09-17 DIAGNOSIS — F419 Anxiety disorder, unspecified: Secondary | ICD-10-CM | POA: Diagnosis not present

## 2018-09-17 DIAGNOSIS — F329 Major depressive disorder, single episode, unspecified: Secondary | ICD-10-CM | POA: Diagnosis not present

## 2018-09-18 DIAGNOSIS — I69320 Aphasia following cerebral infarction: Secondary | ICD-10-CM | POA: Diagnosis not present

## 2018-09-18 DIAGNOSIS — F329 Major depressive disorder, single episode, unspecified: Secondary | ICD-10-CM | POA: Diagnosis not present

## 2018-09-18 DIAGNOSIS — F419 Anxiety disorder, unspecified: Secondary | ICD-10-CM | POA: Diagnosis not present

## 2018-09-18 DIAGNOSIS — J449 Chronic obstructive pulmonary disease, unspecified: Secondary | ICD-10-CM | POA: Diagnosis not present

## 2018-09-18 DIAGNOSIS — I69354 Hemiplegia and hemiparesis following cerebral infarction affecting left non-dominant side: Secondary | ICD-10-CM | POA: Diagnosis not present

## 2018-09-18 DIAGNOSIS — I69391 Dysphagia following cerebral infarction: Secondary | ICD-10-CM | POA: Diagnosis not present

## 2018-09-19 DIAGNOSIS — I69391 Dysphagia following cerebral infarction: Secondary | ICD-10-CM | POA: Diagnosis not present

## 2018-09-19 DIAGNOSIS — I69354 Hemiplegia and hemiparesis following cerebral infarction affecting left non-dominant side: Secondary | ICD-10-CM | POA: Diagnosis not present

## 2018-09-19 DIAGNOSIS — J449 Chronic obstructive pulmonary disease, unspecified: Secondary | ICD-10-CM | POA: Diagnosis not present

## 2018-09-19 DIAGNOSIS — F329 Major depressive disorder, single episode, unspecified: Secondary | ICD-10-CM | POA: Diagnosis not present

## 2018-09-19 DIAGNOSIS — F419 Anxiety disorder, unspecified: Secondary | ICD-10-CM | POA: Diagnosis not present

## 2018-09-19 DIAGNOSIS — I69320 Aphasia following cerebral infarction: Secondary | ICD-10-CM | POA: Diagnosis not present

## 2018-09-22 DIAGNOSIS — I69354 Hemiplegia and hemiparesis following cerebral infarction affecting left non-dominant side: Secondary | ICD-10-CM | POA: Diagnosis not present

## 2018-09-22 DIAGNOSIS — J449 Chronic obstructive pulmonary disease, unspecified: Secondary | ICD-10-CM | POA: Diagnosis not present

## 2018-09-22 DIAGNOSIS — F329 Major depressive disorder, single episode, unspecified: Secondary | ICD-10-CM | POA: Diagnosis not present

## 2018-09-22 DIAGNOSIS — F419 Anxiety disorder, unspecified: Secondary | ICD-10-CM | POA: Diagnosis not present

## 2018-09-22 DIAGNOSIS — I69391 Dysphagia following cerebral infarction: Secondary | ICD-10-CM | POA: Diagnosis not present

## 2018-09-22 DIAGNOSIS — I69320 Aphasia following cerebral infarction: Secondary | ICD-10-CM | POA: Diagnosis not present

## 2018-09-23 DIAGNOSIS — F419 Anxiety disorder, unspecified: Secondary | ICD-10-CM | POA: Diagnosis not present

## 2018-09-23 DIAGNOSIS — I69354 Hemiplegia and hemiparesis following cerebral infarction affecting left non-dominant side: Secondary | ICD-10-CM | POA: Diagnosis not present

## 2018-09-23 DIAGNOSIS — F329 Major depressive disorder, single episode, unspecified: Secondary | ICD-10-CM | POA: Diagnosis not present

## 2018-09-23 DIAGNOSIS — I69391 Dysphagia following cerebral infarction: Secondary | ICD-10-CM | POA: Diagnosis not present

## 2018-09-23 DIAGNOSIS — J449 Chronic obstructive pulmonary disease, unspecified: Secondary | ICD-10-CM | POA: Diagnosis not present

## 2018-09-23 DIAGNOSIS — I69320 Aphasia following cerebral infarction: Secondary | ICD-10-CM | POA: Diagnosis not present

## 2018-09-24 DIAGNOSIS — L89620 Pressure ulcer of left heel, unstageable: Secondary | ICD-10-CM | POA: Diagnosis not present

## 2018-09-24 DIAGNOSIS — I69391 Dysphagia following cerebral infarction: Secondary | ICD-10-CM | POA: Diagnosis not present

## 2018-09-24 DIAGNOSIS — F200 Paranoid schizophrenia: Secondary | ICD-10-CM | POA: Diagnosis not present

## 2018-09-24 DIAGNOSIS — I69354 Hemiplegia and hemiparesis following cerebral infarction affecting left non-dominant side: Secondary | ICD-10-CM | POA: Diagnosis not present

## 2018-09-24 DIAGNOSIS — F329 Major depressive disorder, single episode, unspecified: Secondary | ICD-10-CM | POA: Diagnosis not present

## 2018-09-24 DIAGNOSIS — G40901 Epilepsy, unspecified, not intractable, with status epilepticus: Secondary | ICD-10-CM

## 2018-09-24 DIAGNOSIS — M545 Low back pain: Secondary | ICD-10-CM

## 2018-09-24 DIAGNOSIS — F419 Anxiety disorder, unspecified: Secondary | ICD-10-CM | POA: Diagnosis not present

## 2018-09-24 DIAGNOSIS — I69320 Aphasia following cerebral infarction: Secondary | ICD-10-CM | POA: Diagnosis not present

## 2018-09-24 DIAGNOSIS — J449 Chronic obstructive pulmonary disease, unspecified: Secondary | ICD-10-CM | POA: Diagnosis not present

## 2018-09-25 DIAGNOSIS — F329 Major depressive disorder, single episode, unspecified: Secondary | ICD-10-CM | POA: Diagnosis not present

## 2018-09-25 DIAGNOSIS — F419 Anxiety disorder, unspecified: Secondary | ICD-10-CM | POA: Diagnosis not present

## 2018-09-25 DIAGNOSIS — J449 Chronic obstructive pulmonary disease, unspecified: Secondary | ICD-10-CM | POA: Diagnosis not present

## 2018-09-25 DIAGNOSIS — I69320 Aphasia following cerebral infarction: Secondary | ICD-10-CM | POA: Diagnosis not present

## 2018-09-25 DIAGNOSIS — I69354 Hemiplegia and hemiparesis following cerebral infarction affecting left non-dominant side: Secondary | ICD-10-CM | POA: Diagnosis not present

## 2018-09-25 DIAGNOSIS — I69391 Dysphagia following cerebral infarction: Secondary | ICD-10-CM | POA: Diagnosis not present

## 2018-09-26 DIAGNOSIS — I69391 Dysphagia following cerebral infarction: Secondary | ICD-10-CM | POA: Diagnosis not present

## 2018-09-26 DIAGNOSIS — F419 Anxiety disorder, unspecified: Secondary | ICD-10-CM | POA: Diagnosis not present

## 2018-09-26 DIAGNOSIS — F329 Major depressive disorder, single episode, unspecified: Secondary | ICD-10-CM | POA: Diagnosis not present

## 2018-09-26 DIAGNOSIS — J449 Chronic obstructive pulmonary disease, unspecified: Secondary | ICD-10-CM | POA: Diagnosis not present

## 2018-09-26 DIAGNOSIS — I69354 Hemiplegia and hemiparesis following cerebral infarction affecting left non-dominant side: Secondary | ICD-10-CM | POA: Diagnosis not present

## 2018-09-26 DIAGNOSIS — I69320 Aphasia following cerebral infarction: Secondary | ICD-10-CM | POA: Diagnosis not present

## 2018-09-29 DIAGNOSIS — I69320 Aphasia following cerebral infarction: Secondary | ICD-10-CM | POA: Diagnosis not present

## 2018-09-29 DIAGNOSIS — J449 Chronic obstructive pulmonary disease, unspecified: Secondary | ICD-10-CM | POA: Diagnosis not present

## 2018-09-29 DIAGNOSIS — I69391 Dysphagia following cerebral infarction: Secondary | ICD-10-CM | POA: Diagnosis not present

## 2018-09-29 DIAGNOSIS — F419 Anxiety disorder, unspecified: Secondary | ICD-10-CM | POA: Diagnosis not present

## 2018-09-29 DIAGNOSIS — I69354 Hemiplegia and hemiparesis following cerebral infarction affecting left non-dominant side: Secondary | ICD-10-CM | POA: Diagnosis not present

## 2018-09-29 DIAGNOSIS — F329 Major depressive disorder, single episode, unspecified: Secondary | ICD-10-CM | POA: Diagnosis not present

## 2018-09-30 DIAGNOSIS — J449 Chronic obstructive pulmonary disease, unspecified: Secondary | ICD-10-CM | POA: Diagnosis not present

## 2018-09-30 DIAGNOSIS — I69354 Hemiplegia and hemiparesis following cerebral infarction affecting left non-dominant side: Secondary | ICD-10-CM | POA: Diagnosis not present

## 2018-09-30 DIAGNOSIS — F419 Anxiety disorder, unspecified: Secondary | ICD-10-CM | POA: Diagnosis not present

## 2018-09-30 DIAGNOSIS — I69320 Aphasia following cerebral infarction: Secondary | ICD-10-CM | POA: Diagnosis not present

## 2018-09-30 DIAGNOSIS — F329 Major depressive disorder, single episode, unspecified: Secondary | ICD-10-CM | POA: Diagnosis not present

## 2018-09-30 DIAGNOSIS — I69391 Dysphagia following cerebral infarction: Secondary | ICD-10-CM | POA: Diagnosis not present

## 2018-10-01 DIAGNOSIS — K219 Gastro-esophageal reflux disease without esophagitis: Secondary | ICD-10-CM | POA: Diagnosis not present

## 2018-10-01 DIAGNOSIS — I69320 Aphasia following cerebral infarction: Secondary | ICD-10-CM | POA: Diagnosis not present

## 2018-10-01 DIAGNOSIS — L89522 Pressure ulcer of left ankle, stage 2: Secondary | ICD-10-CM | POA: Diagnosis not present

## 2018-10-01 DIAGNOSIS — G8929 Other chronic pain: Secondary | ICD-10-CM | POA: Diagnosis not present

## 2018-10-01 DIAGNOSIS — M24542 Contracture, left hand: Secondary | ICD-10-CM | POA: Diagnosis not present

## 2018-10-01 DIAGNOSIS — M21372 Foot drop, left foot: Secondary | ICD-10-CM | POA: Diagnosis not present

## 2018-10-01 DIAGNOSIS — F209 Schizophrenia, unspecified: Secondary | ICD-10-CM | POA: Diagnosis not present

## 2018-10-01 DIAGNOSIS — M810 Age-related osteoporosis without current pathological fracture: Secondary | ICD-10-CM | POA: Diagnosis not present

## 2018-10-01 DIAGNOSIS — M21371 Foot drop, right foot: Secondary | ICD-10-CM | POA: Diagnosis not present

## 2018-10-01 DIAGNOSIS — Z741 Need for assistance with personal care: Secondary | ICD-10-CM | POA: Diagnosis not present

## 2018-10-01 DIAGNOSIS — R627 Adult failure to thrive: Secondary | ICD-10-CM | POA: Diagnosis not present

## 2018-10-01 DIAGNOSIS — R600 Localized edema: Secondary | ICD-10-CM | POA: Diagnosis not present

## 2018-10-01 DIAGNOSIS — I69354 Hemiplegia and hemiparesis following cerebral infarction affecting left non-dominant side: Secondary | ICD-10-CM | POA: Diagnosis not present

## 2018-10-01 DIAGNOSIS — Z6820 Body mass index (BMI) 20.0-20.9, adult: Secondary | ICD-10-CM | POA: Diagnosis not present

## 2018-10-01 DIAGNOSIS — R569 Unspecified convulsions: Secondary | ICD-10-CM | POA: Diagnosis not present

## 2018-10-01 DIAGNOSIS — Z431 Encounter for attention to gastrostomy: Secondary | ICD-10-CM | POA: Diagnosis not present

## 2018-10-01 DIAGNOSIS — R208 Other disturbances of skin sensation: Secondary | ICD-10-CM | POA: Diagnosis not present

## 2018-10-01 DIAGNOSIS — F419 Anxiety disorder, unspecified: Secondary | ICD-10-CM | POA: Diagnosis not present

## 2018-10-01 DIAGNOSIS — I69391 Dysphagia following cerebral infarction: Secondary | ICD-10-CM | POA: Diagnosis not present

## 2018-10-01 DIAGNOSIS — J449 Chronic obstructive pulmonary disease, unspecified: Secondary | ICD-10-CM | POA: Diagnosis not present

## 2018-10-01 DIAGNOSIS — F329 Major depressive disorder, single episode, unspecified: Secondary | ICD-10-CM | POA: Diagnosis not present

## 2018-10-01 DIAGNOSIS — R630 Anorexia: Secondary | ICD-10-CM | POA: Diagnosis not present

## 2018-10-01 DIAGNOSIS — Z7401 Bed confinement status: Secondary | ICD-10-CM | POA: Diagnosis not present

## 2018-10-01 DIAGNOSIS — L89622 Pressure ulcer of left heel, stage 2: Secondary | ICD-10-CM | POA: Diagnosis not present

## 2018-10-02 DIAGNOSIS — J449 Chronic obstructive pulmonary disease, unspecified: Secondary | ICD-10-CM | POA: Diagnosis not present

## 2018-10-02 DIAGNOSIS — F419 Anxiety disorder, unspecified: Secondary | ICD-10-CM | POA: Diagnosis not present

## 2018-10-02 DIAGNOSIS — I69391 Dysphagia following cerebral infarction: Secondary | ICD-10-CM | POA: Diagnosis not present

## 2018-10-02 DIAGNOSIS — I69354 Hemiplegia and hemiparesis following cerebral infarction affecting left non-dominant side: Secondary | ICD-10-CM | POA: Diagnosis not present

## 2018-10-02 DIAGNOSIS — F329 Major depressive disorder, single episode, unspecified: Secondary | ICD-10-CM | POA: Diagnosis not present

## 2018-10-02 DIAGNOSIS — I69320 Aphasia following cerebral infarction: Secondary | ICD-10-CM | POA: Diagnosis not present

## 2018-10-03 DIAGNOSIS — I69354 Hemiplegia and hemiparesis following cerebral infarction affecting left non-dominant side: Secondary | ICD-10-CM | POA: Diagnosis not present

## 2018-10-03 DIAGNOSIS — I69391 Dysphagia following cerebral infarction: Secondary | ICD-10-CM | POA: Diagnosis not present

## 2018-10-03 DIAGNOSIS — I69320 Aphasia following cerebral infarction: Secondary | ICD-10-CM | POA: Diagnosis not present

## 2018-10-03 DIAGNOSIS — F419 Anxiety disorder, unspecified: Secondary | ICD-10-CM | POA: Diagnosis not present

## 2018-10-03 DIAGNOSIS — J449 Chronic obstructive pulmonary disease, unspecified: Secondary | ICD-10-CM | POA: Diagnosis not present

## 2018-10-03 DIAGNOSIS — F329 Major depressive disorder, single episode, unspecified: Secondary | ICD-10-CM | POA: Diagnosis not present

## 2018-10-11 DIAGNOSIS — I69391 Dysphagia following cerebral infarction: Secondary | ICD-10-CM | POA: Diagnosis not present

## 2018-10-11 DIAGNOSIS — I69354 Hemiplegia and hemiparesis following cerebral infarction affecting left non-dominant side: Secondary | ICD-10-CM | POA: Diagnosis not present

## 2018-10-11 DIAGNOSIS — F419 Anxiety disorder, unspecified: Secondary | ICD-10-CM | POA: Diagnosis not present

## 2018-10-11 DIAGNOSIS — J449 Chronic obstructive pulmonary disease, unspecified: Secondary | ICD-10-CM | POA: Diagnosis not present

## 2018-10-11 DIAGNOSIS — I69320 Aphasia following cerebral infarction: Secondary | ICD-10-CM | POA: Diagnosis not present

## 2018-10-11 DIAGNOSIS — F329 Major depressive disorder, single episode, unspecified: Secondary | ICD-10-CM | POA: Diagnosis not present

## 2018-10-16 DIAGNOSIS — Z79899 Other long term (current) drug therapy: Secondary | ICD-10-CM | POA: Diagnosis not present

## 2018-10-20 DIAGNOSIS — L89620 Pressure ulcer of left heel, unstageable: Secondary | ICD-10-CM | POA: Diagnosis not present

## 2018-10-30 DIAGNOSIS — L89620 Pressure ulcer of left heel, unstageable: Secondary | ICD-10-CM

## 2018-10-31 DIAGNOSIS — F329 Major depressive disorder, single episode, unspecified: Secondary | ICD-10-CM | POA: Diagnosis not present

## 2018-10-31 DIAGNOSIS — M24542 Contracture, left hand: Secondary | ICD-10-CM | POA: Diagnosis not present

## 2018-10-31 DIAGNOSIS — R630 Anorexia: Secondary | ICD-10-CM | POA: Diagnosis not present

## 2018-10-31 DIAGNOSIS — Z6820 Body mass index (BMI) 20.0-20.9, adult: Secondary | ICD-10-CM | POA: Diagnosis not present

## 2018-10-31 DIAGNOSIS — Z7401 Bed confinement status: Secondary | ICD-10-CM | POA: Diagnosis not present

## 2018-10-31 DIAGNOSIS — I69354 Hemiplegia and hemiparesis following cerebral infarction affecting left non-dominant side: Secondary | ICD-10-CM | POA: Diagnosis not present

## 2018-10-31 DIAGNOSIS — M21371 Foot drop, right foot: Secondary | ICD-10-CM | POA: Diagnosis not present

## 2018-10-31 DIAGNOSIS — I69320 Aphasia following cerebral infarction: Secondary | ICD-10-CM | POA: Diagnosis not present

## 2018-10-31 DIAGNOSIS — Z741 Need for assistance with personal care: Secondary | ICD-10-CM | POA: Diagnosis not present

## 2018-10-31 DIAGNOSIS — R569 Unspecified convulsions: Secondary | ICD-10-CM | POA: Diagnosis not present

## 2018-10-31 DIAGNOSIS — K219 Gastro-esophageal reflux disease without esophagitis: Secondary | ICD-10-CM | POA: Diagnosis not present

## 2018-10-31 DIAGNOSIS — G8929 Other chronic pain: Secondary | ICD-10-CM | POA: Diagnosis not present

## 2018-10-31 DIAGNOSIS — L89622 Pressure ulcer of left heel, stage 2: Secondary | ICD-10-CM | POA: Diagnosis not present

## 2018-10-31 DIAGNOSIS — M810 Age-related osteoporosis without current pathological fracture: Secondary | ICD-10-CM | POA: Diagnosis not present

## 2018-10-31 DIAGNOSIS — F419 Anxiety disorder, unspecified: Secondary | ICD-10-CM | POA: Diagnosis not present

## 2018-10-31 DIAGNOSIS — M21372 Foot drop, left foot: Secondary | ICD-10-CM | POA: Diagnosis not present

## 2018-10-31 DIAGNOSIS — Z431 Encounter for attention to gastrostomy: Secondary | ICD-10-CM | POA: Diagnosis not present

## 2018-10-31 DIAGNOSIS — I69391 Dysphagia following cerebral infarction: Secondary | ICD-10-CM | POA: Diagnosis not present

## 2018-10-31 DIAGNOSIS — F209 Schizophrenia, unspecified: Secondary | ICD-10-CM | POA: Diagnosis not present

## 2018-10-31 DIAGNOSIS — J449 Chronic obstructive pulmonary disease, unspecified: Secondary | ICD-10-CM | POA: Diagnosis not present

## 2018-10-31 DIAGNOSIS — R627 Adult failure to thrive: Secondary | ICD-10-CM | POA: Diagnosis not present

## 2018-10-31 DIAGNOSIS — L89522 Pressure ulcer of left ankle, stage 2: Secondary | ICD-10-CM | POA: Diagnosis not present

## 2018-10-31 DIAGNOSIS — R600 Localized edema: Secondary | ICD-10-CM | POA: Diagnosis not present

## 2018-10-31 DIAGNOSIS — R208 Other disturbances of skin sensation: Secondary | ICD-10-CM | POA: Diagnosis not present

## 2018-11-10 DIAGNOSIS — J449 Chronic obstructive pulmonary disease, unspecified: Secondary | ICD-10-CM | POA: Diagnosis not present

## 2018-11-10 DIAGNOSIS — I69391 Dysphagia following cerebral infarction: Secondary | ICD-10-CM | POA: Diagnosis not present

## 2018-11-10 DIAGNOSIS — I69354 Hemiplegia and hemiparesis following cerebral infarction affecting left non-dominant side: Secondary | ICD-10-CM | POA: Diagnosis not present

## 2018-11-10 DIAGNOSIS — F329 Major depressive disorder, single episode, unspecified: Secondary | ICD-10-CM | POA: Diagnosis not present

## 2018-11-10 DIAGNOSIS — I69320 Aphasia following cerebral infarction: Secondary | ICD-10-CM | POA: Diagnosis not present

## 2018-11-10 DIAGNOSIS — F419 Anxiety disorder, unspecified: Secondary | ICD-10-CM | POA: Diagnosis not present

## 2018-11-20 DIAGNOSIS — I69320 Aphasia following cerebral infarction: Secondary | ICD-10-CM | POA: Diagnosis not present

## 2018-11-20 DIAGNOSIS — I69391 Dysphagia following cerebral infarction: Secondary | ICD-10-CM | POA: Diagnosis not present

## 2018-11-20 DIAGNOSIS — F419 Anxiety disorder, unspecified: Secondary | ICD-10-CM | POA: Diagnosis not present

## 2018-11-20 DIAGNOSIS — J449 Chronic obstructive pulmonary disease, unspecified: Secondary | ICD-10-CM | POA: Diagnosis not present

## 2018-11-20 DIAGNOSIS — I69354 Hemiplegia and hemiparesis following cerebral infarction affecting left non-dominant side: Secondary | ICD-10-CM | POA: Diagnosis not present

## 2018-11-20 DIAGNOSIS — F329 Major depressive disorder, single episode, unspecified: Secondary | ICD-10-CM | POA: Diagnosis not present

## 2018-11-25 DIAGNOSIS — F419 Anxiety disorder, unspecified: Secondary | ICD-10-CM | POA: Diagnosis not present

## 2018-11-25 DIAGNOSIS — J449 Chronic obstructive pulmonary disease, unspecified: Secondary | ICD-10-CM | POA: Diagnosis not present

## 2018-11-25 DIAGNOSIS — I69391 Dysphagia following cerebral infarction: Secondary | ICD-10-CM | POA: Diagnosis not present

## 2018-11-25 DIAGNOSIS — I69320 Aphasia following cerebral infarction: Secondary | ICD-10-CM | POA: Diagnosis not present

## 2018-11-25 DIAGNOSIS — I69354 Hemiplegia and hemiparesis following cerebral infarction affecting left non-dominant side: Secondary | ICD-10-CM | POA: Diagnosis not present

## 2018-11-25 DIAGNOSIS — F329 Major depressive disorder, single episode, unspecified: Secondary | ICD-10-CM | POA: Diagnosis not present

## 2018-11-27 DIAGNOSIS — F200 Paranoid schizophrenia: Secondary | ICD-10-CM

## 2018-11-27 DIAGNOSIS — F39 Unspecified mood [affective] disorder: Secondary | ICD-10-CM

## 2018-11-27 DIAGNOSIS — J439 Emphysema, unspecified: Secondary | ICD-10-CM

## 2018-11-27 DIAGNOSIS — G8194 Hemiplegia, unspecified affecting left nondominant side: Secondary | ICD-10-CM

## 2018-11-27 DIAGNOSIS — G40509 Epileptic seizures related to external causes, not intractable, without status epilepticus: Secondary | ICD-10-CM

## 2018-11-28 DIAGNOSIS — I69354 Hemiplegia and hemiparesis following cerebral infarction affecting left non-dominant side: Secondary | ICD-10-CM | POA: Diagnosis not present

## 2018-11-28 DIAGNOSIS — F419 Anxiety disorder, unspecified: Secondary | ICD-10-CM | POA: Diagnosis not present

## 2018-11-28 DIAGNOSIS — F329 Major depressive disorder, single episode, unspecified: Secondary | ICD-10-CM | POA: Diagnosis not present

## 2018-11-28 DIAGNOSIS — J449 Chronic obstructive pulmonary disease, unspecified: Secondary | ICD-10-CM | POA: Diagnosis not present

## 2018-11-28 DIAGNOSIS — I69320 Aphasia following cerebral infarction: Secondary | ICD-10-CM | POA: Diagnosis not present

## 2018-11-28 DIAGNOSIS — I69391 Dysphagia following cerebral infarction: Secondary | ICD-10-CM | POA: Diagnosis not present

## 2018-12-01 DIAGNOSIS — L89622 Pressure ulcer of left heel, stage 2: Secondary | ICD-10-CM | POA: Diagnosis not present

## 2018-12-01 DIAGNOSIS — M21372 Foot drop, left foot: Secondary | ICD-10-CM | POA: Diagnosis not present

## 2018-12-01 DIAGNOSIS — M21371 Foot drop, right foot: Secondary | ICD-10-CM | POA: Diagnosis not present

## 2018-12-01 DIAGNOSIS — R208 Other disturbances of skin sensation: Secondary | ICD-10-CM | POA: Diagnosis not present

## 2018-12-01 DIAGNOSIS — F329 Major depressive disorder, single episode, unspecified: Secondary | ICD-10-CM | POA: Diagnosis not present

## 2018-12-01 DIAGNOSIS — Z431 Encounter for attention to gastrostomy: Secondary | ICD-10-CM | POA: Diagnosis not present

## 2018-12-01 DIAGNOSIS — M24542 Contracture, left hand: Secondary | ICD-10-CM | POA: Diagnosis not present

## 2018-12-01 DIAGNOSIS — Z741 Need for assistance with personal care: Secondary | ICD-10-CM | POA: Diagnosis not present

## 2018-12-01 DIAGNOSIS — R600 Localized edema: Secondary | ICD-10-CM | POA: Diagnosis not present

## 2018-12-01 DIAGNOSIS — Z7401 Bed confinement status: Secondary | ICD-10-CM | POA: Diagnosis not present

## 2018-12-01 DIAGNOSIS — I69391 Dysphagia following cerebral infarction: Secondary | ICD-10-CM | POA: Diagnosis not present

## 2018-12-01 DIAGNOSIS — K219 Gastro-esophageal reflux disease without esophagitis: Secondary | ICD-10-CM | POA: Diagnosis not present

## 2018-12-01 DIAGNOSIS — R630 Anorexia: Secondary | ICD-10-CM | POA: Diagnosis not present

## 2018-12-01 DIAGNOSIS — I69320 Aphasia following cerebral infarction: Secondary | ICD-10-CM | POA: Diagnosis not present

## 2018-12-01 DIAGNOSIS — J449 Chronic obstructive pulmonary disease, unspecified: Secondary | ICD-10-CM | POA: Diagnosis not present

## 2018-12-01 DIAGNOSIS — R627 Adult failure to thrive: Secondary | ICD-10-CM | POA: Diagnosis not present

## 2018-12-01 DIAGNOSIS — F419 Anxiety disorder, unspecified: Secondary | ICD-10-CM | POA: Diagnosis not present

## 2018-12-01 DIAGNOSIS — F209 Schizophrenia, unspecified: Secondary | ICD-10-CM | POA: Diagnosis not present

## 2018-12-01 DIAGNOSIS — R569 Unspecified convulsions: Secondary | ICD-10-CM | POA: Diagnosis not present

## 2018-12-01 DIAGNOSIS — Z6820 Body mass index (BMI) 20.0-20.9, adult: Secondary | ICD-10-CM | POA: Diagnosis not present

## 2018-12-01 DIAGNOSIS — L89522 Pressure ulcer of left ankle, stage 2: Secondary | ICD-10-CM | POA: Diagnosis not present

## 2018-12-01 DIAGNOSIS — M810 Age-related osteoporosis without current pathological fracture: Secondary | ICD-10-CM | POA: Diagnosis not present

## 2018-12-01 DIAGNOSIS — I69354 Hemiplegia and hemiparesis following cerebral infarction affecting left non-dominant side: Secondary | ICD-10-CM | POA: Diagnosis not present

## 2018-12-01 DIAGNOSIS — G8929 Other chronic pain: Secondary | ICD-10-CM | POA: Diagnosis not present

## 2018-12-02 DIAGNOSIS — F329 Major depressive disorder, single episode, unspecified: Secondary | ICD-10-CM | POA: Diagnosis not present

## 2018-12-02 DIAGNOSIS — I69320 Aphasia following cerebral infarction: Secondary | ICD-10-CM | POA: Diagnosis not present

## 2018-12-02 DIAGNOSIS — I69391 Dysphagia following cerebral infarction: Secondary | ICD-10-CM | POA: Diagnosis not present

## 2018-12-02 DIAGNOSIS — F419 Anxiety disorder, unspecified: Secondary | ICD-10-CM | POA: Diagnosis not present

## 2018-12-02 DIAGNOSIS — J449 Chronic obstructive pulmonary disease, unspecified: Secondary | ICD-10-CM | POA: Diagnosis not present

## 2018-12-02 DIAGNOSIS — I69354 Hemiplegia and hemiparesis following cerebral infarction affecting left non-dominant side: Secondary | ICD-10-CM | POA: Diagnosis not present

## 2018-12-04 DIAGNOSIS — F329 Major depressive disorder, single episode, unspecified: Secondary | ICD-10-CM | POA: Diagnosis not present

## 2018-12-04 DIAGNOSIS — I69391 Dysphagia following cerebral infarction: Secondary | ICD-10-CM | POA: Diagnosis not present

## 2018-12-04 DIAGNOSIS — J449 Chronic obstructive pulmonary disease, unspecified: Secondary | ICD-10-CM | POA: Diagnosis not present

## 2018-12-04 DIAGNOSIS — I69320 Aphasia following cerebral infarction: Secondary | ICD-10-CM | POA: Diagnosis not present

## 2018-12-04 DIAGNOSIS — I69354 Hemiplegia and hemiparesis following cerebral infarction affecting left non-dominant side: Secondary | ICD-10-CM | POA: Diagnosis not present

## 2018-12-04 DIAGNOSIS — F419 Anxiety disorder, unspecified: Secondary | ICD-10-CM | POA: Diagnosis not present

## 2018-12-05 DIAGNOSIS — F419 Anxiety disorder, unspecified: Secondary | ICD-10-CM | POA: Diagnosis not present

## 2018-12-05 DIAGNOSIS — I69391 Dysphagia following cerebral infarction: Secondary | ICD-10-CM | POA: Diagnosis not present

## 2018-12-05 DIAGNOSIS — J449 Chronic obstructive pulmonary disease, unspecified: Secondary | ICD-10-CM | POA: Diagnosis not present

## 2018-12-05 DIAGNOSIS — I69320 Aphasia following cerebral infarction: Secondary | ICD-10-CM | POA: Diagnosis not present

## 2018-12-05 DIAGNOSIS — I69354 Hemiplegia and hemiparesis following cerebral infarction affecting left non-dominant side: Secondary | ICD-10-CM | POA: Diagnosis not present

## 2018-12-05 DIAGNOSIS — F329 Major depressive disorder, single episode, unspecified: Secondary | ICD-10-CM | POA: Diagnosis not present

## 2018-12-08 DIAGNOSIS — I69354 Hemiplegia and hemiparesis following cerebral infarction affecting left non-dominant side: Secondary | ICD-10-CM | POA: Diagnosis not present

## 2018-12-08 DIAGNOSIS — F419 Anxiety disorder, unspecified: Secondary | ICD-10-CM | POA: Diagnosis not present

## 2018-12-08 DIAGNOSIS — I69320 Aphasia following cerebral infarction: Secondary | ICD-10-CM | POA: Diagnosis not present

## 2018-12-08 DIAGNOSIS — J449 Chronic obstructive pulmonary disease, unspecified: Secondary | ICD-10-CM | POA: Diagnosis not present

## 2018-12-08 DIAGNOSIS — I69391 Dysphagia following cerebral infarction: Secondary | ICD-10-CM | POA: Diagnosis not present

## 2018-12-08 DIAGNOSIS — F329 Major depressive disorder, single episode, unspecified: Secondary | ICD-10-CM | POA: Diagnosis not present

## 2018-12-09 DIAGNOSIS — F329 Major depressive disorder, single episode, unspecified: Secondary | ICD-10-CM | POA: Diagnosis not present

## 2018-12-09 DIAGNOSIS — I69391 Dysphagia following cerebral infarction: Secondary | ICD-10-CM | POA: Diagnosis not present

## 2018-12-09 DIAGNOSIS — I69320 Aphasia following cerebral infarction: Secondary | ICD-10-CM | POA: Diagnosis not present

## 2018-12-09 DIAGNOSIS — J449 Chronic obstructive pulmonary disease, unspecified: Secondary | ICD-10-CM | POA: Diagnosis not present

## 2018-12-09 DIAGNOSIS — F419 Anxiety disorder, unspecified: Secondary | ICD-10-CM | POA: Diagnosis not present

## 2018-12-09 DIAGNOSIS — I69354 Hemiplegia and hemiparesis following cerebral infarction affecting left non-dominant side: Secondary | ICD-10-CM | POA: Diagnosis not present

## 2018-12-16 DIAGNOSIS — F419 Anxiety disorder, unspecified: Secondary | ICD-10-CM | POA: Diagnosis not present

## 2018-12-16 DIAGNOSIS — I69391 Dysphagia following cerebral infarction: Secondary | ICD-10-CM | POA: Diagnosis not present

## 2018-12-16 DIAGNOSIS — F329 Major depressive disorder, single episode, unspecified: Secondary | ICD-10-CM | POA: Diagnosis not present

## 2018-12-16 DIAGNOSIS — I69320 Aphasia following cerebral infarction: Secondary | ICD-10-CM | POA: Diagnosis not present

## 2018-12-16 DIAGNOSIS — L89620 Pressure ulcer of left heel, unstageable: Secondary | ICD-10-CM | POA: Diagnosis not present

## 2018-12-16 DIAGNOSIS — J449 Chronic obstructive pulmonary disease, unspecified: Secondary | ICD-10-CM | POA: Diagnosis not present

## 2018-12-16 DIAGNOSIS — I69354 Hemiplegia and hemiparesis following cerebral infarction affecting left non-dominant side: Secondary | ICD-10-CM | POA: Diagnosis not present

## 2018-12-23 DIAGNOSIS — I69391 Dysphagia following cerebral infarction: Secondary | ICD-10-CM | POA: Diagnosis not present

## 2018-12-23 DIAGNOSIS — I69354 Hemiplegia and hemiparesis following cerebral infarction affecting left non-dominant side: Secondary | ICD-10-CM | POA: Diagnosis not present

## 2018-12-23 DIAGNOSIS — F329 Major depressive disorder, single episode, unspecified: Secondary | ICD-10-CM | POA: Diagnosis not present

## 2018-12-23 DIAGNOSIS — F419 Anxiety disorder, unspecified: Secondary | ICD-10-CM | POA: Diagnosis not present

## 2018-12-23 DIAGNOSIS — J449 Chronic obstructive pulmonary disease, unspecified: Secondary | ICD-10-CM | POA: Diagnosis not present

## 2018-12-23 DIAGNOSIS — I69320 Aphasia following cerebral infarction: Secondary | ICD-10-CM | POA: Diagnosis not present

## 2018-12-30 DIAGNOSIS — I69391 Dysphagia following cerebral infarction: Secondary | ICD-10-CM | POA: Diagnosis not present

## 2018-12-30 DIAGNOSIS — F419 Anxiety disorder, unspecified: Secondary | ICD-10-CM | POA: Diagnosis not present

## 2018-12-30 DIAGNOSIS — I69320 Aphasia following cerebral infarction: Secondary | ICD-10-CM | POA: Diagnosis not present

## 2018-12-30 DIAGNOSIS — J449 Chronic obstructive pulmonary disease, unspecified: Secondary | ICD-10-CM | POA: Diagnosis not present

## 2018-12-30 DIAGNOSIS — I69354 Hemiplegia and hemiparesis following cerebral infarction affecting left non-dominant side: Secondary | ICD-10-CM | POA: Diagnosis not present

## 2018-12-30 DIAGNOSIS — F329 Major depressive disorder, single episode, unspecified: Secondary | ICD-10-CM | POA: Diagnosis not present

## 2018-12-31 DIAGNOSIS — I69354 Hemiplegia and hemiparesis following cerebral infarction affecting left non-dominant side: Secondary | ICD-10-CM | POA: Diagnosis not present

## 2018-12-31 DIAGNOSIS — M21371 Foot drop, right foot: Secondary | ICD-10-CM | POA: Diagnosis not present

## 2018-12-31 DIAGNOSIS — K219 Gastro-esophageal reflux disease without esophagitis: Secondary | ICD-10-CM | POA: Diagnosis not present

## 2018-12-31 DIAGNOSIS — I69320 Aphasia following cerebral infarction: Secondary | ICD-10-CM | POA: Diagnosis not present

## 2018-12-31 DIAGNOSIS — F329 Major depressive disorder, single episode, unspecified: Secondary | ICD-10-CM | POA: Diagnosis not present

## 2018-12-31 DIAGNOSIS — F419 Anxiety disorder, unspecified: Secondary | ICD-10-CM | POA: Diagnosis not present

## 2018-12-31 DIAGNOSIS — J449 Chronic obstructive pulmonary disease, unspecified: Secondary | ICD-10-CM | POA: Diagnosis not present

## 2018-12-31 DIAGNOSIS — R4586 Emotional lability: Secondary | ICD-10-CM | POA: Diagnosis not present

## 2018-12-31 DIAGNOSIS — R627 Adult failure to thrive: Secondary | ICD-10-CM | POA: Diagnosis not present

## 2018-12-31 DIAGNOSIS — M21372 Foot drop, left foot: Secondary | ICD-10-CM | POA: Diagnosis not present

## 2018-12-31 DIAGNOSIS — Z7401 Bed confinement status: Secondary | ICD-10-CM | POA: Diagnosis not present

## 2018-12-31 DIAGNOSIS — R208 Other disturbances of skin sensation: Secondary | ICD-10-CM | POA: Diagnosis not present

## 2018-12-31 DIAGNOSIS — L89622 Pressure ulcer of left heel, stage 2: Secondary | ICD-10-CM | POA: Diagnosis not present

## 2018-12-31 DIAGNOSIS — I69391 Dysphagia following cerebral infarction: Secondary | ICD-10-CM | POA: Diagnosis not present

## 2018-12-31 DIAGNOSIS — R630 Anorexia: Secondary | ICD-10-CM | POA: Diagnosis not present

## 2018-12-31 DIAGNOSIS — G8929 Other chronic pain: Secondary | ICD-10-CM | POA: Diagnosis not present

## 2018-12-31 DIAGNOSIS — R569 Unspecified convulsions: Secondary | ICD-10-CM | POA: Diagnosis not present

## 2018-12-31 DIAGNOSIS — Z6820 Body mass index (BMI) 20.0-20.9, adult: Secondary | ICD-10-CM | POA: Diagnosis not present

## 2018-12-31 DIAGNOSIS — F209 Schizophrenia, unspecified: Secondary | ICD-10-CM | POA: Diagnosis not present

## 2018-12-31 DIAGNOSIS — M810 Age-related osteoporosis without current pathological fracture: Secondary | ICD-10-CM | POA: Diagnosis not present

## 2018-12-31 DIAGNOSIS — M24542 Contracture, left hand: Secondary | ICD-10-CM | POA: Diagnosis not present

## 2018-12-31 DIAGNOSIS — Z741 Need for assistance with personal care: Secondary | ICD-10-CM | POA: Diagnosis not present

## 2018-12-31 DIAGNOSIS — R600 Localized edema: Secondary | ICD-10-CM | POA: Diagnosis not present

## 2018-12-31 DIAGNOSIS — Z431 Encounter for attention to gastrostomy: Secondary | ICD-10-CM | POA: Diagnosis not present

## 2019-01-02 DIAGNOSIS — F329 Major depressive disorder, single episode, unspecified: Secondary | ICD-10-CM | POA: Diagnosis not present

## 2019-01-02 DIAGNOSIS — J449 Chronic obstructive pulmonary disease, unspecified: Secondary | ICD-10-CM | POA: Diagnosis not present

## 2019-01-02 DIAGNOSIS — I69391 Dysphagia following cerebral infarction: Secondary | ICD-10-CM | POA: Diagnosis not present

## 2019-01-02 DIAGNOSIS — I69320 Aphasia following cerebral infarction: Secondary | ICD-10-CM | POA: Diagnosis not present

## 2019-01-02 DIAGNOSIS — F419 Anxiety disorder, unspecified: Secondary | ICD-10-CM | POA: Diagnosis not present

## 2019-01-02 DIAGNOSIS — I69354 Hemiplegia and hemiparesis following cerebral infarction affecting left non-dominant side: Secondary | ICD-10-CM | POA: Diagnosis not present

## 2019-01-05 DIAGNOSIS — I69391 Dysphagia following cerebral infarction: Secondary | ICD-10-CM | POA: Diagnosis not present

## 2019-01-05 DIAGNOSIS — I69320 Aphasia following cerebral infarction: Secondary | ICD-10-CM | POA: Diagnosis not present

## 2019-01-05 DIAGNOSIS — F329 Major depressive disorder, single episode, unspecified: Secondary | ICD-10-CM | POA: Diagnosis not present

## 2019-01-05 DIAGNOSIS — F419 Anxiety disorder, unspecified: Secondary | ICD-10-CM | POA: Diagnosis not present

## 2019-01-05 DIAGNOSIS — I69354 Hemiplegia and hemiparesis following cerebral infarction affecting left non-dominant side: Secondary | ICD-10-CM | POA: Diagnosis not present

## 2019-01-05 DIAGNOSIS — J449 Chronic obstructive pulmonary disease, unspecified: Secondary | ICD-10-CM | POA: Diagnosis not present

## 2019-01-06 DIAGNOSIS — J449 Chronic obstructive pulmonary disease, unspecified: Secondary | ICD-10-CM | POA: Diagnosis not present

## 2019-01-06 DIAGNOSIS — I69320 Aphasia following cerebral infarction: Secondary | ICD-10-CM | POA: Diagnosis not present

## 2019-01-06 DIAGNOSIS — F329 Major depressive disorder, single episode, unspecified: Secondary | ICD-10-CM | POA: Diagnosis not present

## 2019-01-06 DIAGNOSIS — F419 Anxiety disorder, unspecified: Secondary | ICD-10-CM | POA: Diagnosis not present

## 2019-01-06 DIAGNOSIS — I69354 Hemiplegia and hemiparesis following cerebral infarction affecting left non-dominant side: Secondary | ICD-10-CM | POA: Diagnosis not present

## 2019-01-06 DIAGNOSIS — I69391 Dysphagia following cerebral infarction: Secondary | ICD-10-CM | POA: Diagnosis not present

## 2019-01-08 DIAGNOSIS — I69391 Dysphagia following cerebral infarction: Secondary | ICD-10-CM | POA: Diagnosis not present

## 2019-01-08 DIAGNOSIS — I69354 Hemiplegia and hemiparesis following cerebral infarction affecting left non-dominant side: Secondary | ICD-10-CM | POA: Diagnosis not present

## 2019-01-08 DIAGNOSIS — J449 Chronic obstructive pulmonary disease, unspecified: Secondary | ICD-10-CM | POA: Diagnosis not present

## 2019-01-08 DIAGNOSIS — I69320 Aphasia following cerebral infarction: Secondary | ICD-10-CM | POA: Diagnosis not present

## 2019-01-08 DIAGNOSIS — F419 Anxiety disorder, unspecified: Secondary | ICD-10-CM | POA: Diagnosis not present

## 2019-01-08 DIAGNOSIS — F329 Major depressive disorder, single episode, unspecified: Secondary | ICD-10-CM | POA: Diagnosis not present

## 2019-01-09 DIAGNOSIS — I69320 Aphasia following cerebral infarction: Secondary | ICD-10-CM | POA: Diagnosis not present

## 2019-01-09 DIAGNOSIS — I69391 Dysphagia following cerebral infarction: Secondary | ICD-10-CM | POA: Diagnosis not present

## 2019-01-09 DIAGNOSIS — F419 Anxiety disorder, unspecified: Secondary | ICD-10-CM | POA: Diagnosis not present

## 2019-01-09 DIAGNOSIS — J449 Chronic obstructive pulmonary disease, unspecified: Secondary | ICD-10-CM | POA: Diagnosis not present

## 2019-01-09 DIAGNOSIS — F329 Major depressive disorder, single episode, unspecified: Secondary | ICD-10-CM | POA: Diagnosis not present

## 2019-01-09 DIAGNOSIS — I69354 Hemiplegia and hemiparesis following cerebral infarction affecting left non-dominant side: Secondary | ICD-10-CM | POA: Diagnosis not present

## 2019-01-13 DIAGNOSIS — I69354 Hemiplegia and hemiparesis following cerebral infarction affecting left non-dominant side: Secondary | ICD-10-CM | POA: Diagnosis not present

## 2019-01-13 DIAGNOSIS — F419 Anxiety disorder, unspecified: Secondary | ICD-10-CM | POA: Diagnosis not present

## 2019-01-13 DIAGNOSIS — I69320 Aphasia following cerebral infarction: Secondary | ICD-10-CM | POA: Diagnosis not present

## 2019-01-13 DIAGNOSIS — I69391 Dysphagia following cerebral infarction: Secondary | ICD-10-CM | POA: Diagnosis not present

## 2019-01-13 DIAGNOSIS — J449 Chronic obstructive pulmonary disease, unspecified: Secondary | ICD-10-CM | POA: Diagnosis not present

## 2019-01-13 DIAGNOSIS — F329 Major depressive disorder, single episode, unspecified: Secondary | ICD-10-CM | POA: Diagnosis not present

## 2019-01-14 DIAGNOSIS — I69391 Dysphagia following cerebral infarction: Secondary | ICD-10-CM | POA: Diagnosis not present

## 2019-01-14 DIAGNOSIS — I69354 Hemiplegia and hemiparesis following cerebral infarction affecting left non-dominant side: Secondary | ICD-10-CM | POA: Diagnosis not present

## 2019-01-14 DIAGNOSIS — F329 Major depressive disorder, single episode, unspecified: Secondary | ICD-10-CM | POA: Diagnosis not present

## 2019-01-14 DIAGNOSIS — F419 Anxiety disorder, unspecified: Secondary | ICD-10-CM | POA: Diagnosis not present

## 2019-01-14 DIAGNOSIS — J449 Chronic obstructive pulmonary disease, unspecified: Secondary | ICD-10-CM | POA: Diagnosis not present

## 2019-01-14 DIAGNOSIS — I69320 Aphasia following cerebral infarction: Secondary | ICD-10-CM | POA: Diagnosis not present

## 2019-01-16 DIAGNOSIS — I69320 Aphasia following cerebral infarction: Secondary | ICD-10-CM | POA: Diagnosis not present

## 2019-01-16 DIAGNOSIS — I69391 Dysphagia following cerebral infarction: Secondary | ICD-10-CM | POA: Diagnosis not present

## 2019-01-16 DIAGNOSIS — F329 Major depressive disorder, single episode, unspecified: Secondary | ICD-10-CM | POA: Diagnosis not present

## 2019-01-16 DIAGNOSIS — I69354 Hemiplegia and hemiparesis following cerebral infarction affecting left non-dominant side: Secondary | ICD-10-CM | POA: Diagnosis not present

## 2019-01-16 DIAGNOSIS — F419 Anxiety disorder, unspecified: Secondary | ICD-10-CM | POA: Diagnosis not present

## 2019-01-16 DIAGNOSIS — J449 Chronic obstructive pulmonary disease, unspecified: Secondary | ICD-10-CM | POA: Diagnosis not present

## 2019-01-20 DIAGNOSIS — F329 Major depressive disorder, single episode, unspecified: Secondary | ICD-10-CM | POA: Diagnosis not present

## 2019-01-20 DIAGNOSIS — I69391 Dysphagia following cerebral infarction: Secondary | ICD-10-CM | POA: Diagnosis not present

## 2019-01-20 DIAGNOSIS — I69320 Aphasia following cerebral infarction: Secondary | ICD-10-CM | POA: Diagnosis not present

## 2019-01-20 DIAGNOSIS — F419 Anxiety disorder, unspecified: Secondary | ICD-10-CM | POA: Diagnosis not present

## 2019-01-20 DIAGNOSIS — I69354 Hemiplegia and hemiparesis following cerebral infarction affecting left non-dominant side: Secondary | ICD-10-CM | POA: Diagnosis not present

## 2019-01-20 DIAGNOSIS — J449 Chronic obstructive pulmonary disease, unspecified: Secondary | ICD-10-CM | POA: Diagnosis not present

## 2019-01-23 DIAGNOSIS — J449 Chronic obstructive pulmonary disease, unspecified: Secondary | ICD-10-CM | POA: Diagnosis not present

## 2019-01-23 DIAGNOSIS — F329 Major depressive disorder, single episode, unspecified: Secondary | ICD-10-CM | POA: Diagnosis not present

## 2019-01-23 DIAGNOSIS — I69391 Dysphagia following cerebral infarction: Secondary | ICD-10-CM | POA: Diagnosis not present

## 2019-01-23 DIAGNOSIS — F419 Anxiety disorder, unspecified: Secondary | ICD-10-CM | POA: Diagnosis not present

## 2019-01-23 DIAGNOSIS — I69354 Hemiplegia and hemiparesis following cerebral infarction affecting left non-dominant side: Secondary | ICD-10-CM | POA: Diagnosis not present

## 2019-01-23 DIAGNOSIS — I69320 Aphasia following cerebral infarction: Secondary | ICD-10-CM | POA: Diagnosis not present

## 2019-01-27 DIAGNOSIS — J449 Chronic obstructive pulmonary disease, unspecified: Secondary | ICD-10-CM | POA: Diagnosis not present

## 2019-01-27 DIAGNOSIS — I69354 Hemiplegia and hemiparesis following cerebral infarction affecting left non-dominant side: Secondary | ICD-10-CM | POA: Diagnosis not present

## 2019-01-27 DIAGNOSIS — I69320 Aphasia following cerebral infarction: Secondary | ICD-10-CM | POA: Diagnosis not present

## 2019-01-27 DIAGNOSIS — F329 Major depressive disorder, single episode, unspecified: Secondary | ICD-10-CM | POA: Diagnosis not present

## 2019-01-27 DIAGNOSIS — F419 Anxiety disorder, unspecified: Secondary | ICD-10-CM | POA: Diagnosis not present

## 2019-01-27 DIAGNOSIS — I69391 Dysphagia following cerebral infarction: Secondary | ICD-10-CM | POA: Diagnosis not present

## 2019-01-28 DIAGNOSIS — F39 Unspecified mood [affective] disorder: Secondary | ICD-10-CM | POA: Diagnosis not present

## 2019-01-28 DIAGNOSIS — G40901 Epilepsy, unspecified, not intractable, with status epilepticus: Secondary | ICD-10-CM | POA: Diagnosis not present

## 2019-01-28 DIAGNOSIS — J449 Chronic obstructive pulmonary disease, unspecified: Secondary | ICD-10-CM

## 2019-01-28 DIAGNOSIS — I69351 Hemiplegia and hemiparesis following cerebral infarction affecting right dominant side: Secondary | ICD-10-CM | POA: Diagnosis not present

## 2019-01-28 DIAGNOSIS — F200 Paranoid schizophrenia: Secondary | ICD-10-CM

## 2019-01-28 DIAGNOSIS — F1120 Opioid dependence, uncomplicated: Secondary | ICD-10-CM

## 2019-01-28 DIAGNOSIS — M48061 Spinal stenosis, lumbar region without neurogenic claudication: Secondary | ICD-10-CM

## 2019-01-28 DIAGNOSIS — L89620 Pressure ulcer of left heel, unstageable: Secondary | ICD-10-CM

## 2019-01-29 ENCOUNTER — Other Ambulatory Visit: Payer: Self-pay

## 2019-01-29 DIAGNOSIS — I69391 Dysphagia following cerebral infarction: Secondary | ICD-10-CM | POA: Diagnosis not present

## 2019-01-29 DIAGNOSIS — I69354 Hemiplegia and hemiparesis following cerebral infarction affecting left non-dominant side: Secondary | ICD-10-CM | POA: Diagnosis not present

## 2019-01-29 DIAGNOSIS — F419 Anxiety disorder, unspecified: Secondary | ICD-10-CM | POA: Diagnosis not present

## 2019-01-29 DIAGNOSIS — F329 Major depressive disorder, single episode, unspecified: Secondary | ICD-10-CM | POA: Diagnosis not present

## 2019-01-29 DIAGNOSIS — J449 Chronic obstructive pulmonary disease, unspecified: Secondary | ICD-10-CM | POA: Diagnosis not present

## 2019-01-29 DIAGNOSIS — I69320 Aphasia following cerebral infarction: Secondary | ICD-10-CM | POA: Diagnosis not present

## 2019-01-30 DIAGNOSIS — J449 Chronic obstructive pulmonary disease, unspecified: Secondary | ICD-10-CM | POA: Diagnosis not present

## 2019-01-30 DIAGNOSIS — F419 Anxiety disorder, unspecified: Secondary | ICD-10-CM | POA: Diagnosis not present

## 2019-01-30 DIAGNOSIS — I69354 Hemiplegia and hemiparesis following cerebral infarction affecting left non-dominant side: Secondary | ICD-10-CM | POA: Diagnosis not present

## 2019-01-30 DIAGNOSIS — F329 Major depressive disorder, single episode, unspecified: Secondary | ICD-10-CM | POA: Diagnosis not present

## 2019-01-30 DIAGNOSIS — I69320 Aphasia following cerebral infarction: Secondary | ICD-10-CM | POA: Diagnosis not present

## 2019-01-30 DIAGNOSIS — I69391 Dysphagia following cerebral infarction: Secondary | ICD-10-CM | POA: Diagnosis not present

## 2019-01-31 DIAGNOSIS — R627 Adult failure to thrive: Secondary | ICD-10-CM | POA: Diagnosis not present

## 2019-01-31 DIAGNOSIS — Z431 Encounter for attention to gastrostomy: Secondary | ICD-10-CM | POA: Diagnosis not present

## 2019-01-31 DIAGNOSIS — L89622 Pressure ulcer of left heel, stage 2: Secondary | ICD-10-CM | POA: Diagnosis not present

## 2019-01-31 DIAGNOSIS — R4586 Emotional lability: Secondary | ICD-10-CM | POA: Diagnosis not present

## 2019-01-31 DIAGNOSIS — M810 Age-related osteoporosis without current pathological fracture: Secondary | ICD-10-CM | POA: Diagnosis not present

## 2019-01-31 DIAGNOSIS — R630 Anorexia: Secondary | ICD-10-CM | POA: Diagnosis not present

## 2019-01-31 DIAGNOSIS — I69391 Dysphagia following cerebral infarction: Secondary | ICD-10-CM | POA: Diagnosis not present

## 2019-01-31 DIAGNOSIS — R569 Unspecified convulsions: Secondary | ICD-10-CM | POA: Diagnosis not present

## 2019-01-31 DIAGNOSIS — J449 Chronic obstructive pulmonary disease, unspecified: Secondary | ICD-10-CM | POA: Diagnosis not present

## 2019-01-31 DIAGNOSIS — I69354 Hemiplegia and hemiparesis following cerebral infarction affecting left non-dominant side: Secondary | ICD-10-CM | POA: Diagnosis not present

## 2019-01-31 DIAGNOSIS — M21371 Foot drop, right foot: Secondary | ICD-10-CM | POA: Diagnosis not present

## 2019-01-31 DIAGNOSIS — M21372 Foot drop, left foot: Secondary | ICD-10-CM | POA: Diagnosis not present

## 2019-01-31 DIAGNOSIS — Z6820 Body mass index (BMI) 20.0-20.9, adult: Secondary | ICD-10-CM | POA: Diagnosis not present

## 2019-01-31 DIAGNOSIS — I69320 Aphasia following cerebral infarction: Secondary | ICD-10-CM | POA: Diagnosis not present

## 2019-01-31 DIAGNOSIS — F209 Schizophrenia, unspecified: Secondary | ICD-10-CM | POA: Diagnosis not present

## 2019-01-31 DIAGNOSIS — Z741 Need for assistance with personal care: Secondary | ICD-10-CM | POA: Diagnosis not present

## 2019-01-31 DIAGNOSIS — K219 Gastro-esophageal reflux disease without esophagitis: Secondary | ICD-10-CM | POA: Diagnosis not present

## 2019-01-31 DIAGNOSIS — F419 Anxiety disorder, unspecified: Secondary | ICD-10-CM | POA: Diagnosis not present

## 2019-01-31 DIAGNOSIS — R600 Localized edema: Secondary | ICD-10-CM | POA: Diagnosis not present

## 2019-01-31 DIAGNOSIS — M24542 Contracture, left hand: Secondary | ICD-10-CM | POA: Diagnosis not present

## 2019-01-31 DIAGNOSIS — R208 Other disturbances of skin sensation: Secondary | ICD-10-CM | POA: Diagnosis not present

## 2019-01-31 DIAGNOSIS — F329 Major depressive disorder, single episode, unspecified: Secondary | ICD-10-CM | POA: Diagnosis not present

## 2019-01-31 DIAGNOSIS — Z7401 Bed confinement status: Secondary | ICD-10-CM | POA: Diagnosis not present

## 2019-01-31 DIAGNOSIS — G8929 Other chronic pain: Secondary | ICD-10-CM | POA: Diagnosis not present

## 2019-02-03 DIAGNOSIS — I69391 Dysphagia following cerebral infarction: Secondary | ICD-10-CM | POA: Diagnosis not present

## 2019-02-03 DIAGNOSIS — I69320 Aphasia following cerebral infarction: Secondary | ICD-10-CM | POA: Diagnosis not present

## 2019-02-03 DIAGNOSIS — F419 Anxiety disorder, unspecified: Secondary | ICD-10-CM | POA: Diagnosis not present

## 2019-02-03 DIAGNOSIS — F329 Major depressive disorder, single episode, unspecified: Secondary | ICD-10-CM | POA: Diagnosis not present

## 2019-02-03 DIAGNOSIS — I69354 Hemiplegia and hemiparesis following cerebral infarction affecting left non-dominant side: Secondary | ICD-10-CM | POA: Diagnosis not present

## 2019-02-03 DIAGNOSIS — J449 Chronic obstructive pulmonary disease, unspecified: Secondary | ICD-10-CM | POA: Diagnosis not present

## 2019-02-05 DIAGNOSIS — I69354 Hemiplegia and hemiparesis following cerebral infarction affecting left non-dominant side: Secondary | ICD-10-CM | POA: Diagnosis not present

## 2019-02-05 DIAGNOSIS — L89620 Pressure ulcer of left heel, unstageable: Secondary | ICD-10-CM

## 2019-02-05 DIAGNOSIS — F419 Anxiety disorder, unspecified: Secondary | ICD-10-CM | POA: Diagnosis not present

## 2019-02-05 DIAGNOSIS — I69391 Dysphagia following cerebral infarction: Secondary | ICD-10-CM | POA: Diagnosis not present

## 2019-02-05 DIAGNOSIS — I69320 Aphasia following cerebral infarction: Secondary | ICD-10-CM | POA: Diagnosis not present

## 2019-02-05 DIAGNOSIS — F329 Major depressive disorder, single episode, unspecified: Secondary | ICD-10-CM | POA: Diagnosis not present

## 2019-02-05 DIAGNOSIS — J449 Chronic obstructive pulmonary disease, unspecified: Secondary | ICD-10-CM | POA: Diagnosis not present

## 2019-02-06 DIAGNOSIS — I69354 Hemiplegia and hemiparesis following cerebral infarction affecting left non-dominant side: Secondary | ICD-10-CM | POA: Diagnosis not present

## 2019-02-06 DIAGNOSIS — F329 Major depressive disorder, single episode, unspecified: Secondary | ICD-10-CM | POA: Diagnosis not present

## 2019-02-06 DIAGNOSIS — I69320 Aphasia following cerebral infarction: Secondary | ICD-10-CM | POA: Diagnosis not present

## 2019-02-06 DIAGNOSIS — J449 Chronic obstructive pulmonary disease, unspecified: Secondary | ICD-10-CM | POA: Diagnosis not present

## 2019-02-06 DIAGNOSIS — I69391 Dysphagia following cerebral infarction: Secondary | ICD-10-CM | POA: Diagnosis not present

## 2019-02-06 DIAGNOSIS — F419 Anxiety disorder, unspecified: Secondary | ICD-10-CM | POA: Diagnosis not present

## 2019-02-09 DIAGNOSIS — F329 Major depressive disorder, single episode, unspecified: Secondary | ICD-10-CM | POA: Diagnosis not present

## 2019-02-09 DIAGNOSIS — I69320 Aphasia following cerebral infarction: Secondary | ICD-10-CM | POA: Diagnosis not present

## 2019-02-09 DIAGNOSIS — J449 Chronic obstructive pulmonary disease, unspecified: Secondary | ICD-10-CM | POA: Diagnosis not present

## 2019-02-09 DIAGNOSIS — F419 Anxiety disorder, unspecified: Secondary | ICD-10-CM | POA: Diagnosis not present

## 2019-02-09 DIAGNOSIS — I69391 Dysphagia following cerebral infarction: Secondary | ICD-10-CM | POA: Diagnosis not present

## 2019-02-09 DIAGNOSIS — I69354 Hemiplegia and hemiparesis following cerebral infarction affecting left non-dominant side: Secondary | ICD-10-CM | POA: Diagnosis not present

## 2019-02-13 DIAGNOSIS — J449 Chronic obstructive pulmonary disease, unspecified: Secondary | ICD-10-CM | POA: Diagnosis not present

## 2019-02-13 DIAGNOSIS — F329 Major depressive disorder, single episode, unspecified: Secondary | ICD-10-CM | POA: Diagnosis not present

## 2019-02-13 DIAGNOSIS — I69320 Aphasia following cerebral infarction: Secondary | ICD-10-CM | POA: Diagnosis not present

## 2019-02-13 DIAGNOSIS — F419 Anxiety disorder, unspecified: Secondary | ICD-10-CM | POA: Diagnosis not present

## 2019-02-13 DIAGNOSIS — I69354 Hemiplegia and hemiparesis following cerebral infarction affecting left non-dominant side: Secondary | ICD-10-CM | POA: Diagnosis not present

## 2019-02-13 DIAGNOSIS — I69391 Dysphagia following cerebral infarction: Secondary | ICD-10-CM | POA: Diagnosis not present

## 2019-02-17 DIAGNOSIS — J449 Chronic obstructive pulmonary disease, unspecified: Secondary | ICD-10-CM | POA: Diagnosis not present

## 2019-02-17 DIAGNOSIS — F419 Anxiety disorder, unspecified: Secondary | ICD-10-CM | POA: Diagnosis not present

## 2019-02-17 DIAGNOSIS — I69354 Hemiplegia and hemiparesis following cerebral infarction affecting left non-dominant side: Secondary | ICD-10-CM | POA: Diagnosis not present

## 2019-02-17 DIAGNOSIS — I69391 Dysphagia following cerebral infarction: Secondary | ICD-10-CM | POA: Diagnosis not present

## 2019-02-17 DIAGNOSIS — F329 Major depressive disorder, single episode, unspecified: Secondary | ICD-10-CM | POA: Diagnosis not present

## 2019-02-17 DIAGNOSIS — I69320 Aphasia following cerebral infarction: Secondary | ICD-10-CM | POA: Diagnosis not present

## 2019-02-20 DIAGNOSIS — F329 Major depressive disorder, single episode, unspecified: Secondary | ICD-10-CM | POA: Diagnosis not present

## 2019-02-20 DIAGNOSIS — I69354 Hemiplegia and hemiparesis following cerebral infarction affecting left non-dominant side: Secondary | ICD-10-CM | POA: Diagnosis not present

## 2019-02-20 DIAGNOSIS — J449 Chronic obstructive pulmonary disease, unspecified: Secondary | ICD-10-CM | POA: Diagnosis not present

## 2019-02-20 DIAGNOSIS — I69320 Aphasia following cerebral infarction: Secondary | ICD-10-CM | POA: Diagnosis not present

## 2019-02-20 DIAGNOSIS — F419 Anxiety disorder, unspecified: Secondary | ICD-10-CM | POA: Diagnosis not present

## 2019-02-20 DIAGNOSIS — I69391 Dysphagia following cerebral infarction: Secondary | ICD-10-CM | POA: Diagnosis not present

## 2019-02-24 DIAGNOSIS — L03116 Cellulitis of left lower limb: Secondary | ICD-10-CM | POA: Diagnosis not present

## 2019-02-24 DIAGNOSIS — F329 Major depressive disorder, single episode, unspecified: Secondary | ICD-10-CM | POA: Diagnosis not present

## 2019-02-24 DIAGNOSIS — I69354 Hemiplegia and hemiparesis following cerebral infarction affecting left non-dominant side: Secondary | ICD-10-CM | POA: Diagnosis not present

## 2019-02-24 DIAGNOSIS — I69391 Dysphagia following cerebral infarction: Secondary | ICD-10-CM | POA: Diagnosis not present

## 2019-02-24 DIAGNOSIS — J449 Chronic obstructive pulmonary disease, unspecified: Secondary | ICD-10-CM | POA: Diagnosis not present

## 2019-02-24 DIAGNOSIS — I69320 Aphasia following cerebral infarction: Secondary | ICD-10-CM | POA: Diagnosis not present

## 2019-02-24 DIAGNOSIS — F419 Anxiety disorder, unspecified: Secondary | ICD-10-CM | POA: Diagnosis not present

## 2019-02-26 DIAGNOSIS — F329 Major depressive disorder, single episode, unspecified: Secondary | ICD-10-CM | POA: Diagnosis not present

## 2019-02-26 DIAGNOSIS — F419 Anxiety disorder, unspecified: Secondary | ICD-10-CM | POA: Diagnosis not present

## 2019-02-26 DIAGNOSIS — I69320 Aphasia following cerebral infarction: Secondary | ICD-10-CM | POA: Diagnosis not present

## 2019-02-26 DIAGNOSIS — I69391 Dysphagia following cerebral infarction: Secondary | ICD-10-CM | POA: Diagnosis not present

## 2019-02-26 DIAGNOSIS — I69354 Hemiplegia and hemiparesis following cerebral infarction affecting left non-dominant side: Secondary | ICD-10-CM | POA: Diagnosis not present

## 2019-02-26 DIAGNOSIS — J449 Chronic obstructive pulmonary disease, unspecified: Secondary | ICD-10-CM | POA: Diagnosis not present

## 2019-02-27 DIAGNOSIS — J449 Chronic obstructive pulmonary disease, unspecified: Secondary | ICD-10-CM | POA: Diagnosis not present

## 2019-02-27 DIAGNOSIS — F419 Anxiety disorder, unspecified: Secondary | ICD-10-CM | POA: Diagnosis not present

## 2019-02-27 DIAGNOSIS — I69391 Dysphagia following cerebral infarction: Secondary | ICD-10-CM | POA: Diagnosis not present

## 2019-02-27 DIAGNOSIS — I69354 Hemiplegia and hemiparesis following cerebral infarction affecting left non-dominant side: Secondary | ICD-10-CM | POA: Diagnosis not present

## 2019-02-27 DIAGNOSIS — F329 Major depressive disorder, single episode, unspecified: Secondary | ICD-10-CM | POA: Diagnosis not present

## 2019-02-27 DIAGNOSIS — I69320 Aphasia following cerebral infarction: Secondary | ICD-10-CM | POA: Diagnosis not present

## 2019-03-03 DIAGNOSIS — R600 Localized edema: Secondary | ICD-10-CM | POA: Diagnosis not present

## 2019-03-03 DIAGNOSIS — Z741 Need for assistance with personal care: Secondary | ICD-10-CM | POA: Diagnosis not present

## 2019-03-03 DIAGNOSIS — R4586 Emotional lability: Secondary | ICD-10-CM | POA: Diagnosis not present

## 2019-03-03 DIAGNOSIS — F209 Schizophrenia, unspecified: Secondary | ICD-10-CM | POA: Diagnosis not present

## 2019-03-03 DIAGNOSIS — Z431 Encounter for attention to gastrostomy: Secondary | ICD-10-CM | POA: Diagnosis not present

## 2019-03-03 DIAGNOSIS — M810 Age-related osteoporosis without current pathological fracture: Secondary | ICD-10-CM | POA: Diagnosis not present

## 2019-03-03 DIAGNOSIS — R627 Adult failure to thrive: Secondary | ICD-10-CM | POA: Diagnosis not present

## 2019-03-03 DIAGNOSIS — R0902 Hypoxemia: Secondary | ICD-10-CM | POA: Diagnosis not present

## 2019-03-03 DIAGNOSIS — M21372 Foot drop, left foot: Secondary | ICD-10-CM | POA: Diagnosis not present

## 2019-03-03 DIAGNOSIS — I69320 Aphasia following cerebral infarction: Secondary | ICD-10-CM | POA: Diagnosis not present

## 2019-03-03 DIAGNOSIS — F329 Major depressive disorder, single episode, unspecified: Secondary | ICD-10-CM | POA: Diagnosis not present

## 2019-03-03 DIAGNOSIS — M21371 Foot drop, right foot: Secondary | ICD-10-CM | POA: Diagnosis not present

## 2019-03-03 DIAGNOSIS — R569 Unspecified convulsions: Secondary | ICD-10-CM | POA: Diagnosis not present

## 2019-03-03 DIAGNOSIS — F419 Anxiety disorder, unspecified: Secondary | ICD-10-CM | POA: Diagnosis not present

## 2019-03-03 DIAGNOSIS — I69354 Hemiplegia and hemiparesis following cerebral infarction affecting left non-dominant side: Secondary | ICD-10-CM | POA: Diagnosis not present

## 2019-03-03 DIAGNOSIS — K219 Gastro-esophageal reflux disease without esophagitis: Secondary | ICD-10-CM | POA: Diagnosis not present

## 2019-03-03 DIAGNOSIS — Z7401 Bed confinement status: Secondary | ICD-10-CM | POA: Diagnosis not present

## 2019-03-03 DIAGNOSIS — R208 Other disturbances of skin sensation: Secondary | ICD-10-CM | POA: Diagnosis not present

## 2019-03-03 DIAGNOSIS — I69391 Dysphagia following cerebral infarction: Secondary | ICD-10-CM | POA: Diagnosis not present

## 2019-03-03 DIAGNOSIS — M24542 Contracture, left hand: Secondary | ICD-10-CM | POA: Diagnosis not present

## 2019-03-03 DIAGNOSIS — R630 Anorexia: Secondary | ICD-10-CM | POA: Diagnosis not present

## 2019-03-03 DIAGNOSIS — G8929 Other chronic pain: Secondary | ICD-10-CM | POA: Diagnosis not present

## 2019-03-03 DIAGNOSIS — J449 Chronic obstructive pulmonary disease, unspecified: Secondary | ICD-10-CM | POA: Diagnosis not present

## 2019-03-03 DIAGNOSIS — L89622 Pressure ulcer of left heel, stage 2: Secondary | ICD-10-CM | POA: Diagnosis not present

## 2019-03-05 DIAGNOSIS — I69391 Dysphagia following cerebral infarction: Secondary | ICD-10-CM | POA: Diagnosis not present

## 2019-03-05 DIAGNOSIS — J449 Chronic obstructive pulmonary disease, unspecified: Secondary | ICD-10-CM | POA: Diagnosis not present

## 2019-03-05 DIAGNOSIS — I69354 Hemiplegia and hemiparesis following cerebral infarction affecting left non-dominant side: Secondary | ICD-10-CM | POA: Diagnosis not present

## 2019-03-05 DIAGNOSIS — F419 Anxiety disorder, unspecified: Secondary | ICD-10-CM | POA: Diagnosis not present

## 2019-03-05 DIAGNOSIS — I69320 Aphasia following cerebral infarction: Secondary | ICD-10-CM | POA: Diagnosis not present

## 2019-03-05 DIAGNOSIS — F329 Major depressive disorder, single episode, unspecified: Secondary | ICD-10-CM | POA: Diagnosis not present

## 2019-03-06 DIAGNOSIS — F329 Major depressive disorder, single episode, unspecified: Secondary | ICD-10-CM | POA: Diagnosis not present

## 2019-03-06 DIAGNOSIS — I69354 Hemiplegia and hemiparesis following cerebral infarction affecting left non-dominant side: Secondary | ICD-10-CM | POA: Diagnosis not present

## 2019-03-06 DIAGNOSIS — F419 Anxiety disorder, unspecified: Secondary | ICD-10-CM | POA: Diagnosis not present

## 2019-03-06 DIAGNOSIS — J449 Chronic obstructive pulmonary disease, unspecified: Secondary | ICD-10-CM | POA: Diagnosis not present

## 2019-03-06 DIAGNOSIS — I69391 Dysphagia following cerebral infarction: Secondary | ICD-10-CM | POA: Diagnosis not present

## 2019-03-06 DIAGNOSIS — I69320 Aphasia following cerebral infarction: Secondary | ICD-10-CM | POA: Diagnosis not present

## 2019-03-10 DIAGNOSIS — J449 Chronic obstructive pulmonary disease, unspecified: Secondary | ICD-10-CM | POA: Diagnosis not present

## 2019-03-10 DIAGNOSIS — I69354 Hemiplegia and hemiparesis following cerebral infarction affecting left non-dominant side: Secondary | ICD-10-CM | POA: Diagnosis not present

## 2019-03-10 DIAGNOSIS — F329 Major depressive disorder, single episode, unspecified: Secondary | ICD-10-CM | POA: Diagnosis not present

## 2019-03-10 DIAGNOSIS — I69391 Dysphagia following cerebral infarction: Secondary | ICD-10-CM | POA: Diagnosis not present

## 2019-03-10 DIAGNOSIS — F419 Anxiety disorder, unspecified: Secondary | ICD-10-CM | POA: Diagnosis not present

## 2019-03-10 DIAGNOSIS — I69320 Aphasia following cerebral infarction: Secondary | ICD-10-CM | POA: Diagnosis not present

## 2019-03-11 DIAGNOSIS — I69354 Hemiplegia and hemiparesis following cerebral infarction affecting left non-dominant side: Secondary | ICD-10-CM | POA: Diagnosis not present

## 2019-03-11 DIAGNOSIS — I69391 Dysphagia following cerebral infarction: Secondary | ICD-10-CM | POA: Diagnosis not present

## 2019-03-11 DIAGNOSIS — I69320 Aphasia following cerebral infarction: Secondary | ICD-10-CM | POA: Diagnosis not present

## 2019-03-11 DIAGNOSIS — F419 Anxiety disorder, unspecified: Secondary | ICD-10-CM | POA: Diagnosis not present

## 2019-03-11 DIAGNOSIS — F329 Major depressive disorder, single episode, unspecified: Secondary | ICD-10-CM | POA: Diagnosis not present

## 2019-03-11 DIAGNOSIS — J449 Chronic obstructive pulmonary disease, unspecified: Secondary | ICD-10-CM | POA: Diagnosis not present

## 2019-03-12 DIAGNOSIS — I69391 Dysphagia following cerebral infarction: Secondary | ICD-10-CM | POA: Diagnosis not present

## 2019-03-12 DIAGNOSIS — Z03818 Encounter for observation for suspected exposure to other biological agents ruled out: Secondary | ICD-10-CM | POA: Diagnosis not present

## 2019-03-12 DIAGNOSIS — J449 Chronic obstructive pulmonary disease, unspecified: Secondary | ICD-10-CM | POA: Diagnosis not present

## 2019-03-12 DIAGNOSIS — I69320 Aphasia following cerebral infarction: Secondary | ICD-10-CM | POA: Diagnosis not present

## 2019-03-12 DIAGNOSIS — I69354 Hemiplegia and hemiparesis following cerebral infarction affecting left non-dominant side: Secondary | ICD-10-CM | POA: Diagnosis not present

## 2019-03-12 DIAGNOSIS — F419 Anxiety disorder, unspecified: Secondary | ICD-10-CM | POA: Diagnosis not present

## 2019-03-12 DIAGNOSIS — F329 Major depressive disorder, single episode, unspecified: Secondary | ICD-10-CM | POA: Diagnosis not present

## 2019-03-13 DIAGNOSIS — F419 Anxiety disorder, unspecified: Secondary | ICD-10-CM | POA: Diagnosis not present

## 2019-03-13 DIAGNOSIS — I69320 Aphasia following cerebral infarction: Secondary | ICD-10-CM | POA: Diagnosis not present

## 2019-03-13 DIAGNOSIS — F329 Major depressive disorder, single episode, unspecified: Secondary | ICD-10-CM | POA: Diagnosis not present

## 2019-03-13 DIAGNOSIS — J449 Chronic obstructive pulmonary disease, unspecified: Secondary | ICD-10-CM | POA: Diagnosis not present

## 2019-03-13 DIAGNOSIS — I69391 Dysphagia following cerebral infarction: Secondary | ICD-10-CM | POA: Diagnosis not present

## 2019-03-13 DIAGNOSIS — I69354 Hemiplegia and hemiparesis following cerebral infarction affecting left non-dominant side: Secondary | ICD-10-CM | POA: Diagnosis not present

## 2019-03-17 DIAGNOSIS — J449 Chronic obstructive pulmonary disease, unspecified: Secondary | ICD-10-CM | POA: Diagnosis not present

## 2019-03-17 DIAGNOSIS — F329 Major depressive disorder, single episode, unspecified: Secondary | ICD-10-CM | POA: Diagnosis not present

## 2019-03-17 DIAGNOSIS — I69354 Hemiplegia and hemiparesis following cerebral infarction affecting left non-dominant side: Secondary | ICD-10-CM | POA: Diagnosis not present

## 2019-03-17 DIAGNOSIS — I69391 Dysphagia following cerebral infarction: Secondary | ICD-10-CM | POA: Diagnosis not present

## 2019-03-17 DIAGNOSIS — I69320 Aphasia following cerebral infarction: Secondary | ICD-10-CM | POA: Diagnosis not present

## 2019-03-17 DIAGNOSIS — F419 Anxiety disorder, unspecified: Secondary | ICD-10-CM | POA: Diagnosis not present

## 2019-03-19 DIAGNOSIS — J439 Emphysema, unspecified: Secondary | ICD-10-CM

## 2019-03-19 DIAGNOSIS — J449 Chronic obstructive pulmonary disease, unspecified: Secondary | ICD-10-CM | POA: Diagnosis not present

## 2019-03-19 DIAGNOSIS — G8194 Hemiplegia, unspecified affecting left nondominant side: Secondary | ICD-10-CM | POA: Diagnosis not present

## 2019-03-19 DIAGNOSIS — I69391 Dysphagia following cerebral infarction: Secondary | ICD-10-CM | POA: Diagnosis not present

## 2019-03-19 DIAGNOSIS — F419 Anxiety disorder, unspecified: Secondary | ICD-10-CM | POA: Diagnosis not present

## 2019-03-19 DIAGNOSIS — I69354 Hemiplegia and hemiparesis following cerebral infarction affecting left non-dominant side: Secondary | ICD-10-CM | POA: Diagnosis not present

## 2019-03-19 DIAGNOSIS — F200 Paranoid schizophrenia: Secondary | ICD-10-CM

## 2019-03-19 DIAGNOSIS — F329 Major depressive disorder, single episode, unspecified: Secondary | ICD-10-CM | POA: Diagnosis not present

## 2019-03-19 DIAGNOSIS — L97529 Non-pressure chronic ulcer of other part of left foot with unspecified severity: Secondary | ICD-10-CM | POA: Diagnosis not present

## 2019-03-19 DIAGNOSIS — L03116 Cellulitis of left lower limb: Secondary | ICD-10-CM | POA: Diagnosis not present

## 2019-03-19 DIAGNOSIS — I69320 Aphasia following cerebral infarction: Secondary | ICD-10-CM | POA: Diagnosis not present

## 2019-03-20 DIAGNOSIS — I69354 Hemiplegia and hemiparesis following cerebral infarction affecting left non-dominant side: Secondary | ICD-10-CM | POA: Diagnosis not present

## 2019-03-20 DIAGNOSIS — F419 Anxiety disorder, unspecified: Secondary | ICD-10-CM | POA: Diagnosis not present

## 2019-03-20 DIAGNOSIS — F329 Major depressive disorder, single episode, unspecified: Secondary | ICD-10-CM | POA: Diagnosis not present

## 2019-03-20 DIAGNOSIS — I69391 Dysphagia following cerebral infarction: Secondary | ICD-10-CM | POA: Diagnosis not present

## 2019-03-20 DIAGNOSIS — J449 Chronic obstructive pulmonary disease, unspecified: Secondary | ICD-10-CM | POA: Diagnosis not present

## 2019-03-20 DIAGNOSIS — I69320 Aphasia following cerebral infarction: Secondary | ICD-10-CM | POA: Diagnosis not present

## 2019-03-24 DIAGNOSIS — J449 Chronic obstructive pulmonary disease, unspecified: Secondary | ICD-10-CM | POA: Diagnosis not present

## 2019-03-24 DIAGNOSIS — F419 Anxiety disorder, unspecified: Secondary | ICD-10-CM | POA: Diagnosis not present

## 2019-03-24 DIAGNOSIS — F329 Major depressive disorder, single episode, unspecified: Secondary | ICD-10-CM | POA: Diagnosis not present

## 2019-03-24 DIAGNOSIS — I69391 Dysphagia following cerebral infarction: Secondary | ICD-10-CM | POA: Diagnosis not present

## 2019-03-24 DIAGNOSIS — I69320 Aphasia following cerebral infarction: Secondary | ICD-10-CM | POA: Diagnosis not present

## 2019-03-24 DIAGNOSIS — I69354 Hemiplegia and hemiparesis following cerebral infarction affecting left non-dominant side: Secondary | ICD-10-CM | POA: Diagnosis not present

## 2019-03-26 DIAGNOSIS — I69320 Aphasia following cerebral infarction: Secondary | ICD-10-CM | POA: Diagnosis not present

## 2019-03-26 DIAGNOSIS — I69354 Hemiplegia and hemiparesis following cerebral infarction affecting left non-dominant side: Secondary | ICD-10-CM | POA: Diagnosis not present

## 2019-03-26 DIAGNOSIS — F329 Major depressive disorder, single episode, unspecified: Secondary | ICD-10-CM | POA: Diagnosis not present

## 2019-03-26 DIAGNOSIS — J449 Chronic obstructive pulmonary disease, unspecified: Secondary | ICD-10-CM | POA: Diagnosis not present

## 2019-03-26 DIAGNOSIS — F419 Anxiety disorder, unspecified: Secondary | ICD-10-CM | POA: Diagnosis not present

## 2019-03-26 DIAGNOSIS — I69391 Dysphagia following cerebral infarction: Secondary | ICD-10-CM | POA: Diagnosis not present

## 2019-03-27 DIAGNOSIS — F419 Anxiety disorder, unspecified: Secondary | ICD-10-CM | POA: Diagnosis not present

## 2019-03-27 DIAGNOSIS — J449 Chronic obstructive pulmonary disease, unspecified: Secondary | ICD-10-CM | POA: Diagnosis not present

## 2019-03-27 DIAGNOSIS — B351 Tinea unguium: Secondary | ICD-10-CM | POA: Diagnosis not present

## 2019-03-27 DIAGNOSIS — I69320 Aphasia following cerebral infarction: Secondary | ICD-10-CM | POA: Diagnosis not present

## 2019-03-27 DIAGNOSIS — I69391 Dysphagia following cerebral infarction: Secondary | ICD-10-CM | POA: Diagnosis not present

## 2019-03-27 DIAGNOSIS — F329 Major depressive disorder, single episode, unspecified: Secondary | ICD-10-CM | POA: Diagnosis not present

## 2019-03-27 DIAGNOSIS — I69354 Hemiplegia and hemiparesis following cerebral infarction affecting left non-dominant side: Secondary | ICD-10-CM | POA: Diagnosis not present

## 2019-03-31 DIAGNOSIS — F419 Anxiety disorder, unspecified: Secondary | ICD-10-CM | POA: Diagnosis not present

## 2019-03-31 DIAGNOSIS — I69391 Dysphagia following cerebral infarction: Secondary | ICD-10-CM | POA: Diagnosis not present

## 2019-03-31 DIAGNOSIS — F329 Major depressive disorder, single episode, unspecified: Secondary | ICD-10-CM | POA: Diagnosis not present

## 2019-03-31 DIAGNOSIS — I69320 Aphasia following cerebral infarction: Secondary | ICD-10-CM | POA: Diagnosis not present

## 2019-03-31 DIAGNOSIS — J449 Chronic obstructive pulmonary disease, unspecified: Secondary | ICD-10-CM | POA: Diagnosis not present

## 2019-03-31 DIAGNOSIS — I69354 Hemiplegia and hemiparesis following cerebral infarction affecting left non-dominant side: Secondary | ICD-10-CM | POA: Diagnosis not present

## 2019-04-02 DIAGNOSIS — I69320 Aphasia following cerebral infarction: Secondary | ICD-10-CM | POA: Diagnosis not present

## 2019-04-02 DIAGNOSIS — G8929 Other chronic pain: Secondary | ICD-10-CM | POA: Diagnosis not present

## 2019-04-02 DIAGNOSIS — K219 Gastro-esophageal reflux disease without esophagitis: Secondary | ICD-10-CM | POA: Diagnosis not present

## 2019-04-02 DIAGNOSIS — M21372 Foot drop, left foot: Secondary | ICD-10-CM | POA: Diagnosis not present

## 2019-04-02 DIAGNOSIS — R0902 Hypoxemia: Secondary | ICD-10-CM | POA: Diagnosis not present

## 2019-04-02 DIAGNOSIS — M24542 Contracture, left hand: Secondary | ICD-10-CM | POA: Diagnosis not present

## 2019-04-02 DIAGNOSIS — Z7401 Bed confinement status: Secondary | ICD-10-CM | POA: Diagnosis not present

## 2019-04-02 DIAGNOSIS — R569 Unspecified convulsions: Secondary | ICD-10-CM | POA: Diagnosis not present

## 2019-04-02 DIAGNOSIS — F419 Anxiety disorder, unspecified: Secondary | ICD-10-CM | POA: Diagnosis not present

## 2019-04-02 DIAGNOSIS — M810 Age-related osteoporosis without current pathological fracture: Secondary | ICD-10-CM | POA: Diagnosis not present

## 2019-04-02 DIAGNOSIS — L89622 Pressure ulcer of left heel, stage 2: Secondary | ICD-10-CM | POA: Diagnosis not present

## 2019-04-02 DIAGNOSIS — M21371 Foot drop, right foot: Secondary | ICD-10-CM | POA: Diagnosis not present

## 2019-04-02 DIAGNOSIS — I69354 Hemiplegia and hemiparesis following cerebral infarction affecting left non-dominant side: Secondary | ICD-10-CM | POA: Diagnosis not present

## 2019-04-02 DIAGNOSIS — F329 Major depressive disorder, single episode, unspecified: Secondary | ICD-10-CM | POA: Diagnosis not present

## 2019-04-02 DIAGNOSIS — J449 Chronic obstructive pulmonary disease, unspecified: Secondary | ICD-10-CM | POA: Diagnosis not present

## 2019-04-02 DIAGNOSIS — Z741 Need for assistance with personal care: Secondary | ICD-10-CM | POA: Diagnosis not present

## 2019-04-02 DIAGNOSIS — F209 Schizophrenia, unspecified: Secondary | ICD-10-CM | POA: Diagnosis not present

## 2019-04-02 DIAGNOSIS — R627 Adult failure to thrive: Secondary | ICD-10-CM | POA: Diagnosis not present

## 2019-04-02 DIAGNOSIS — R630 Anorexia: Secondary | ICD-10-CM | POA: Diagnosis not present

## 2019-04-02 DIAGNOSIS — R4586 Emotional lability: Secondary | ICD-10-CM | POA: Diagnosis not present

## 2019-04-02 DIAGNOSIS — I69391 Dysphagia following cerebral infarction: Secondary | ICD-10-CM | POA: Diagnosis not present

## 2019-04-02 DIAGNOSIS — R600 Localized edema: Secondary | ICD-10-CM | POA: Diagnosis not present

## 2019-04-02 DIAGNOSIS — R208 Other disturbances of skin sensation: Secondary | ICD-10-CM | POA: Diagnosis not present

## 2019-04-02 DIAGNOSIS — Z431 Encounter for attention to gastrostomy: Secondary | ICD-10-CM | POA: Diagnosis not present

## 2019-04-03 DIAGNOSIS — Z03818 Encounter for observation for suspected exposure to other biological agents ruled out: Secondary | ICD-10-CM | POA: Diagnosis not present

## 2019-04-08 DIAGNOSIS — I69391 Dysphagia following cerebral infarction: Secondary | ICD-10-CM | POA: Diagnosis not present

## 2019-04-08 DIAGNOSIS — F419 Anxiety disorder, unspecified: Secondary | ICD-10-CM | POA: Diagnosis not present

## 2019-04-08 DIAGNOSIS — J449 Chronic obstructive pulmonary disease, unspecified: Secondary | ICD-10-CM | POA: Diagnosis not present

## 2019-04-08 DIAGNOSIS — F329 Major depressive disorder, single episode, unspecified: Secondary | ICD-10-CM | POA: Diagnosis not present

## 2019-04-08 DIAGNOSIS — I69320 Aphasia following cerebral infarction: Secondary | ICD-10-CM | POA: Diagnosis not present

## 2019-04-08 DIAGNOSIS — I69354 Hemiplegia and hemiparesis following cerebral infarction affecting left non-dominant side: Secondary | ICD-10-CM | POA: Diagnosis not present

## 2019-04-09 DIAGNOSIS — F419 Anxiety disorder, unspecified: Secondary | ICD-10-CM | POA: Diagnosis not present

## 2019-04-09 DIAGNOSIS — L89629 Pressure ulcer of left heel, unspecified stage: Secondary | ICD-10-CM

## 2019-04-09 DIAGNOSIS — I69354 Hemiplegia and hemiparesis following cerebral infarction affecting left non-dominant side: Secondary | ICD-10-CM | POA: Diagnosis not present

## 2019-04-09 DIAGNOSIS — F329 Major depressive disorder, single episode, unspecified: Secondary | ICD-10-CM | POA: Diagnosis not present

## 2019-04-09 DIAGNOSIS — I69391 Dysphagia following cerebral infarction: Secondary | ICD-10-CM | POA: Diagnosis not present

## 2019-04-09 DIAGNOSIS — I69320 Aphasia following cerebral infarction: Secondary | ICD-10-CM | POA: Diagnosis not present

## 2019-04-09 DIAGNOSIS — J449 Chronic obstructive pulmonary disease, unspecified: Secondary | ICD-10-CM | POA: Diagnosis not present

## 2019-04-14 DIAGNOSIS — J449 Chronic obstructive pulmonary disease, unspecified: Secondary | ICD-10-CM | POA: Diagnosis not present

## 2019-04-14 DIAGNOSIS — F329 Major depressive disorder, single episode, unspecified: Secondary | ICD-10-CM | POA: Diagnosis not present

## 2019-04-14 DIAGNOSIS — F419 Anxiety disorder, unspecified: Secondary | ICD-10-CM | POA: Diagnosis not present

## 2019-04-14 DIAGNOSIS — I69391 Dysphagia following cerebral infarction: Secondary | ICD-10-CM | POA: Diagnosis not present

## 2019-04-14 DIAGNOSIS — I69320 Aphasia following cerebral infarction: Secondary | ICD-10-CM | POA: Diagnosis not present

## 2019-04-14 DIAGNOSIS — I69354 Hemiplegia and hemiparesis following cerebral infarction affecting left non-dominant side: Secondary | ICD-10-CM | POA: Diagnosis not present

## 2019-04-14 DIAGNOSIS — Z03818 Encounter for observation for suspected exposure to other biological agents ruled out: Secondary | ICD-10-CM | POA: Diagnosis not present

## 2019-04-17 DIAGNOSIS — Z03818 Encounter for observation for suspected exposure to other biological agents ruled out: Secondary | ICD-10-CM | POA: Diagnosis not present

## 2019-04-21 DIAGNOSIS — Z03818 Encounter for observation for suspected exposure to other biological agents ruled out: Secondary | ICD-10-CM | POA: Diagnosis not present

## 2019-04-28 DIAGNOSIS — L03116 Cellulitis of left lower limb: Secondary | ICD-10-CM | POA: Diagnosis not present

## 2019-05-03 DIAGNOSIS — Z7401 Bed confinement status: Secondary | ICD-10-CM | POA: Diagnosis not present

## 2019-05-03 DIAGNOSIS — F419 Anxiety disorder, unspecified: Secondary | ICD-10-CM | POA: Diagnosis not present

## 2019-05-03 DIAGNOSIS — Z741 Need for assistance with personal care: Secondary | ICD-10-CM | POA: Diagnosis not present

## 2019-05-03 DIAGNOSIS — G8929 Other chronic pain: Secondary | ICD-10-CM | POA: Diagnosis not present

## 2019-05-03 DIAGNOSIS — R4586 Emotional lability: Secondary | ICD-10-CM | POA: Diagnosis not present

## 2019-05-03 DIAGNOSIS — R0902 Hypoxemia: Secondary | ICD-10-CM | POA: Diagnosis not present

## 2019-05-03 DIAGNOSIS — M21372 Foot drop, left foot: Secondary | ICD-10-CM | POA: Diagnosis not present

## 2019-05-03 DIAGNOSIS — F329 Major depressive disorder, single episode, unspecified: Secondary | ICD-10-CM | POA: Diagnosis not present

## 2019-05-03 DIAGNOSIS — I69320 Aphasia following cerebral infarction: Secondary | ICD-10-CM | POA: Diagnosis not present

## 2019-05-03 DIAGNOSIS — I69354 Hemiplegia and hemiparesis following cerebral infarction affecting left non-dominant side: Secondary | ICD-10-CM | POA: Diagnosis not present

## 2019-05-03 DIAGNOSIS — Z431 Encounter for attention to gastrostomy: Secondary | ICD-10-CM | POA: Diagnosis not present

## 2019-05-03 DIAGNOSIS — R630 Anorexia: Secondary | ICD-10-CM | POA: Diagnosis not present

## 2019-05-03 DIAGNOSIS — L89623 Pressure ulcer of left heel, stage 3: Secondary | ICD-10-CM | POA: Diagnosis not present

## 2019-05-03 DIAGNOSIS — M810 Age-related osteoporosis without current pathological fracture: Secondary | ICD-10-CM | POA: Diagnosis not present

## 2019-05-03 DIAGNOSIS — R208 Other disturbances of skin sensation: Secondary | ICD-10-CM | POA: Diagnosis not present

## 2019-05-03 DIAGNOSIS — M21371 Foot drop, right foot: Secondary | ICD-10-CM | POA: Diagnosis not present

## 2019-05-03 DIAGNOSIS — F209 Schizophrenia, unspecified: Secondary | ICD-10-CM | POA: Diagnosis not present

## 2019-05-03 DIAGNOSIS — J449 Chronic obstructive pulmonary disease, unspecified: Secondary | ICD-10-CM | POA: Diagnosis not present

## 2019-05-03 DIAGNOSIS — R627 Adult failure to thrive: Secondary | ICD-10-CM | POA: Diagnosis not present

## 2019-05-03 DIAGNOSIS — K219 Gastro-esophageal reflux disease without esophagitis: Secondary | ICD-10-CM | POA: Diagnosis not present

## 2019-05-03 DIAGNOSIS — R569 Unspecified convulsions: Secondary | ICD-10-CM | POA: Diagnosis not present

## 2019-05-03 DIAGNOSIS — M24542 Contracture, left hand: Secondary | ICD-10-CM | POA: Diagnosis not present

## 2019-05-03 DIAGNOSIS — I69391 Dysphagia following cerebral infarction: Secondary | ICD-10-CM | POA: Diagnosis not present

## 2019-05-03 DIAGNOSIS — R600 Localized edema: Secondary | ICD-10-CM | POA: Diagnosis not present

## 2019-05-08 DIAGNOSIS — F329 Major depressive disorder, single episode, unspecified: Secondary | ICD-10-CM | POA: Diagnosis not present

## 2019-05-08 DIAGNOSIS — J449 Chronic obstructive pulmonary disease, unspecified: Secondary | ICD-10-CM | POA: Diagnosis not present

## 2019-05-08 DIAGNOSIS — I69354 Hemiplegia and hemiparesis following cerebral infarction affecting left non-dominant side: Secondary | ICD-10-CM | POA: Diagnosis not present

## 2019-05-08 DIAGNOSIS — I69320 Aphasia following cerebral infarction: Secondary | ICD-10-CM | POA: Diagnosis not present

## 2019-05-08 DIAGNOSIS — F419 Anxiety disorder, unspecified: Secondary | ICD-10-CM | POA: Diagnosis not present

## 2019-05-08 DIAGNOSIS — I69391 Dysphagia following cerebral infarction: Secondary | ICD-10-CM | POA: Diagnosis not present

## 2019-05-15 DIAGNOSIS — J449 Chronic obstructive pulmonary disease, unspecified: Secondary | ICD-10-CM

## 2019-05-15 DIAGNOSIS — I69354 Hemiplegia and hemiparesis following cerebral infarction affecting left non-dominant side: Secondary | ICD-10-CM

## 2019-05-15 DIAGNOSIS — G40909 Epilepsy, unspecified, not intractable, without status epilepticus: Secondary | ICD-10-CM

## 2019-05-15 DIAGNOSIS — U071 COVID-19: Secondary | ICD-10-CM

## 2019-05-15 DIAGNOSIS — L89621 Pressure ulcer of left heel, stage 1: Secondary | ICD-10-CM

## 2019-05-15 DIAGNOSIS — M48061 Spinal stenosis, lumbar region without neurogenic claudication: Secondary | ICD-10-CM

## 2019-05-15 DIAGNOSIS — L97529 Non-pressure chronic ulcer of other part of left foot with unspecified severity: Secondary | ICD-10-CM

## 2019-05-15 DIAGNOSIS — F1120 Opioid dependence, uncomplicated: Secondary | ICD-10-CM

## 2019-05-15 DIAGNOSIS — F209 Schizophrenia, unspecified: Secondary | ICD-10-CM

## 2019-05-15 DIAGNOSIS — F39 Unspecified mood [affective] disorder: Secondary | ICD-10-CM

## 2019-06-16 DIAGNOSIS — L97529 Non-pressure chronic ulcer of other part of left foot with unspecified severity: Secondary | ICD-10-CM | POA: Diagnosis not present

## 2019-06-22 DIAGNOSIS — L97529 Non-pressure chronic ulcer of other part of left foot with unspecified severity: Secondary | ICD-10-CM | POA: Diagnosis not present

## 2019-07-07 DIAGNOSIS — L97529 Non-pressure chronic ulcer of other part of left foot with unspecified severity: Secondary | ICD-10-CM

## 2019-07-13 DIAGNOSIS — J439 Emphysema, unspecified: Secondary | ICD-10-CM

## 2019-07-13 DIAGNOSIS — F1120 Opioid dependence, uncomplicated: Secondary | ICD-10-CM

## 2019-07-13 DIAGNOSIS — F200 Paranoid schizophrenia: Secondary | ICD-10-CM

## 2019-07-13 DIAGNOSIS — F39 Unspecified mood [affective] disorder: Secondary | ICD-10-CM

## 2019-07-13 DIAGNOSIS — G8194 Hemiplegia, unspecified affecting left nondominant side: Secondary | ICD-10-CM

## 2019-07-13 DIAGNOSIS — G4089 Other seizures: Secondary | ICD-10-CM

## 2019-07-16 DIAGNOSIS — L97529 Non-pressure chronic ulcer of other part of left foot with unspecified severity: Secondary | ICD-10-CM

## 2019-07-30 DIAGNOSIS — L97529 Non-pressure chronic ulcer of other part of left foot with unspecified severity: Secondary | ICD-10-CM | POA: Diagnosis not present

## 2019-08-13 DIAGNOSIS — L97529 Non-pressure chronic ulcer of other part of left foot with unspecified severity: Secondary | ICD-10-CM | POA: Diagnosis not present

## 2019-09-14 DIAGNOSIS — L97529 Non-pressure chronic ulcer of other part of left foot with unspecified severity: Secondary | ICD-10-CM | POA: Diagnosis not present

## 2019-09-18 DIAGNOSIS — L97529 Non-pressure chronic ulcer of other part of left foot with unspecified severity: Secondary | ICD-10-CM

## 2019-09-18 DIAGNOSIS — G40901 Epilepsy, unspecified, not intractable, with status epilepticus: Secondary | ICD-10-CM

## 2019-09-18 DIAGNOSIS — M545 Low back pain: Secondary | ICD-10-CM

## 2019-09-18 DIAGNOSIS — I69354 Hemiplegia and hemiparesis following cerebral infarction affecting left non-dominant side: Secondary | ICD-10-CM | POA: Diagnosis not present

## 2019-09-18 DIAGNOSIS — F1120 Opioid dependence, uncomplicated: Secondary | ICD-10-CM

## 2019-09-18 DIAGNOSIS — F200 Paranoid schizophrenia: Secondary | ICD-10-CM

## 2019-09-18 DIAGNOSIS — J449 Chronic obstructive pulmonary disease, unspecified: Secondary | ICD-10-CM | POA: Diagnosis not present

## 2019-11-26 DIAGNOSIS — F200 Paranoid schizophrenia: Secondary | ICD-10-CM | POA: Diagnosis not present

## 2019-11-26 DIAGNOSIS — G4089 Other seizures: Secondary | ICD-10-CM | POA: Diagnosis not present

## 2019-11-26 DIAGNOSIS — J439 Emphysema, unspecified: Secondary | ICD-10-CM | POA: Diagnosis not present

## 2019-11-26 DIAGNOSIS — F1120 Opioid dependence, uncomplicated: Secondary | ICD-10-CM

## 2019-11-26 DIAGNOSIS — G8194 Hemiplegia, unspecified affecting left nondominant side: Secondary | ICD-10-CM

## 2019-11-26 DIAGNOSIS — F39 Unspecified mood [affective] disorder: Secondary | ICD-10-CM

## 2020-01-22 DIAGNOSIS — J449 Chronic obstructive pulmonary disease, unspecified: Secondary | ICD-10-CM | POA: Diagnosis not present

## 2020-01-22 DIAGNOSIS — M545 Low back pain: Secondary | ICD-10-CM

## 2020-01-22 DIAGNOSIS — F1120 Opioid dependence, uncomplicated: Secondary | ICD-10-CM

## 2020-01-22 DIAGNOSIS — G40909 Epilepsy, unspecified, not intractable, without status epilepticus: Secondary | ICD-10-CM | POA: Diagnosis not present

## 2020-01-22 DIAGNOSIS — I69354 Hemiplegia and hemiparesis following cerebral infarction affecting left non-dominant side: Secondary | ICD-10-CM | POA: Diagnosis not present

## 2020-01-22 DIAGNOSIS — F200 Paranoid schizophrenia: Secondary | ICD-10-CM

## 2020-01-22 DIAGNOSIS — F39 Unspecified mood [affective] disorder: Secondary | ICD-10-CM | POA: Diagnosis not present

## 2020-01-22 DIAGNOSIS — B351 Tinea unguium: Secondary | ICD-10-CM | POA: Diagnosis not present

## 2020-02-01 LAB — CBC AND DIFFERENTIAL
HCT: 38 (ref 36–46)
Hemoglobin: 12.4 (ref 12.0–16.0)
Platelets: 275 10*3/uL (ref 150–400)
WBC: 10.6

## 2020-02-01 LAB — CBC: RBC: 3.76 — AB (ref 3.87–5.11)

## 2020-02-23 DIAGNOSIS — L03011 Cellulitis of right finger: Secondary | ICD-10-CM | POA: Diagnosis not present

## 2020-03-28 DIAGNOSIS — G8194 Hemiplegia, unspecified affecting left nondominant side: Secondary | ICD-10-CM | POA: Diagnosis not present

## 2020-03-28 DIAGNOSIS — F200 Paranoid schizophrenia: Secondary | ICD-10-CM | POA: Diagnosis not present

## 2020-03-28 DIAGNOSIS — F39 Unspecified mood [affective] disorder: Secondary | ICD-10-CM | POA: Diagnosis not present

## 2020-03-28 DIAGNOSIS — J439 Emphysema, unspecified: Secondary | ICD-10-CM | POA: Diagnosis not present

## 2020-03-28 DIAGNOSIS — F1120 Opioid dependence, uncomplicated: Secondary | ICD-10-CM

## 2020-04-18 DIAGNOSIS — L03032 Cellulitis of left toe: Secondary | ICD-10-CM | POA: Diagnosis not present

## 2020-05-11 DIAGNOSIS — L97529 Non-pressure chronic ulcer of other part of left foot with unspecified severity: Secondary | ICD-10-CM

## 2020-05-11 DIAGNOSIS — J449 Chronic obstructive pulmonary disease, unspecified: Secondary | ICD-10-CM | POA: Diagnosis not present

## 2020-05-11 DIAGNOSIS — F200 Paranoid schizophrenia: Secondary | ICD-10-CM

## 2020-05-11 DIAGNOSIS — M545 Low back pain, unspecified: Secondary | ICD-10-CM

## 2020-05-11 DIAGNOSIS — F11288 Opioid dependence with other opioid-induced disorder: Secondary | ICD-10-CM

## 2020-05-11 DIAGNOSIS — G40901 Epilepsy, unspecified, not intractable, with status epilepticus: Secondary | ICD-10-CM

## 2020-05-11 DIAGNOSIS — I69354 Hemiplegia and hemiparesis following cerebral infarction affecting left non-dominant side: Secondary | ICD-10-CM

## 2020-05-13 DIAGNOSIS — Z23 Encounter for immunization: Secondary | ICD-10-CM | POA: Diagnosis not present

## 2020-06-01 DIAGNOSIS — R634 Abnormal weight loss: Secondary | ICD-10-CM | POA: Diagnosis not present

## 2020-06-01 DIAGNOSIS — Z8619 Personal history of other infectious and parasitic diseases: Secondary | ICD-10-CM | POA: Diagnosis not present

## 2020-06-01 DIAGNOSIS — R0902 Hypoxemia: Secondary | ICD-10-CM | POA: Diagnosis not present

## 2020-06-01 DIAGNOSIS — F419 Anxiety disorder, unspecified: Secondary | ICD-10-CM | POA: Diagnosis not present

## 2020-06-01 DIAGNOSIS — F209 Schizophrenia, unspecified: Secondary | ICD-10-CM | POA: Diagnosis not present

## 2020-06-01 DIAGNOSIS — R230 Cyanosis: Secondary | ICD-10-CM | POA: Diagnosis not present

## 2020-06-01 DIAGNOSIS — L97529 Non-pressure chronic ulcer of other part of left foot with unspecified severity: Secondary | ICD-10-CM | POA: Diagnosis not present

## 2020-06-01 DIAGNOSIS — M21371 Foot drop, right foot: Secondary | ICD-10-CM | POA: Diagnosis not present

## 2020-06-01 DIAGNOSIS — I69320 Aphasia following cerebral infarction: Secondary | ICD-10-CM | POA: Diagnosis not present

## 2020-06-01 DIAGNOSIS — R4586 Emotional lability: Secondary | ICD-10-CM | POA: Diagnosis not present

## 2020-06-01 DIAGNOSIS — R600 Localized edema: Secondary | ICD-10-CM | POA: Diagnosis not present

## 2020-06-01 DIAGNOSIS — I959 Hypotension, unspecified: Secondary | ICD-10-CM | POA: Diagnosis not present

## 2020-06-01 DIAGNOSIS — R630 Anorexia: Secondary | ICD-10-CM | POA: Diagnosis not present

## 2020-06-01 DIAGNOSIS — M21372 Foot drop, left foot: Secondary | ICD-10-CM | POA: Diagnosis not present

## 2020-06-01 DIAGNOSIS — G8929 Other chronic pain: Secondary | ICD-10-CM | POA: Diagnosis not present

## 2020-06-01 DIAGNOSIS — R569 Unspecified convulsions: Secondary | ICD-10-CM | POA: Diagnosis not present

## 2020-06-01 DIAGNOSIS — Z7401 Bed confinement status: Secondary | ICD-10-CM | POA: Diagnosis not present

## 2020-06-01 DIAGNOSIS — R627 Adult failure to thrive: Secondary | ICD-10-CM | POA: Diagnosis not present

## 2020-06-01 DIAGNOSIS — L89623 Pressure ulcer of left heel, stage 3: Secondary | ICD-10-CM | POA: Diagnosis not present

## 2020-06-01 DIAGNOSIS — I69391 Dysphagia following cerebral infarction: Secondary | ICD-10-CM | POA: Diagnosis not present

## 2020-06-01 DIAGNOSIS — R208 Other disturbances of skin sensation: Secondary | ICD-10-CM | POA: Diagnosis not present

## 2020-06-01 DIAGNOSIS — F32A Depression, unspecified: Secondary | ICD-10-CM | POA: Diagnosis not present

## 2020-06-01 DIAGNOSIS — M24542 Contracture, left hand: Secondary | ICD-10-CM | POA: Diagnosis not present

## 2020-06-01 DIAGNOSIS — J449 Chronic obstructive pulmonary disease, unspecified: Secondary | ICD-10-CM | POA: Diagnosis not present

## 2020-06-01 DIAGNOSIS — I69354 Hemiplegia and hemiparesis following cerebral infarction affecting left non-dominant side: Secondary | ICD-10-CM | POA: Diagnosis not present

## 2020-06-02 DIAGNOSIS — R630 Anorexia: Secondary | ICD-10-CM | POA: Diagnosis not present

## 2020-06-02 DIAGNOSIS — I69354 Hemiplegia and hemiparesis following cerebral infarction affecting left non-dominant side: Secondary | ICD-10-CM | POA: Diagnosis not present

## 2020-06-02 DIAGNOSIS — I69391 Dysphagia following cerebral infarction: Secondary | ICD-10-CM | POA: Diagnosis not present

## 2020-06-02 DIAGNOSIS — I69320 Aphasia following cerebral infarction: Secondary | ICD-10-CM | POA: Diagnosis not present

## 2020-06-02 DIAGNOSIS — F419 Anxiety disorder, unspecified: Secondary | ICD-10-CM | POA: Diagnosis not present

## 2020-06-02 DIAGNOSIS — J449 Chronic obstructive pulmonary disease, unspecified: Secondary | ICD-10-CM | POA: Diagnosis not present

## 2020-06-07 DIAGNOSIS — I69391 Dysphagia following cerebral infarction: Secondary | ICD-10-CM | POA: Diagnosis not present

## 2020-06-07 DIAGNOSIS — F419 Anxiety disorder, unspecified: Secondary | ICD-10-CM | POA: Diagnosis not present

## 2020-06-07 DIAGNOSIS — I69354 Hemiplegia and hemiparesis following cerebral infarction affecting left non-dominant side: Secondary | ICD-10-CM | POA: Diagnosis not present

## 2020-06-07 DIAGNOSIS — R630 Anorexia: Secondary | ICD-10-CM | POA: Diagnosis not present

## 2020-06-07 DIAGNOSIS — J449 Chronic obstructive pulmonary disease, unspecified: Secondary | ICD-10-CM | POA: Diagnosis not present

## 2020-06-07 DIAGNOSIS — I69320 Aphasia following cerebral infarction: Secondary | ICD-10-CM | POA: Diagnosis not present

## 2020-06-09 DIAGNOSIS — I69354 Hemiplegia and hemiparesis following cerebral infarction affecting left non-dominant side: Secondary | ICD-10-CM | POA: Diagnosis not present

## 2020-06-09 DIAGNOSIS — I69320 Aphasia following cerebral infarction: Secondary | ICD-10-CM | POA: Diagnosis not present

## 2020-06-09 DIAGNOSIS — I69391 Dysphagia following cerebral infarction: Secondary | ICD-10-CM | POA: Diagnosis not present

## 2020-06-09 DIAGNOSIS — J449 Chronic obstructive pulmonary disease, unspecified: Secondary | ICD-10-CM | POA: Diagnosis not present

## 2020-06-09 DIAGNOSIS — F419 Anxiety disorder, unspecified: Secondary | ICD-10-CM | POA: Diagnosis not present

## 2020-06-09 DIAGNOSIS — R630 Anorexia: Secondary | ICD-10-CM | POA: Diagnosis not present

## 2020-06-10 DIAGNOSIS — I69391 Dysphagia following cerebral infarction: Secondary | ICD-10-CM | POA: Diagnosis not present

## 2020-06-10 DIAGNOSIS — R630 Anorexia: Secondary | ICD-10-CM | POA: Diagnosis not present

## 2020-06-10 DIAGNOSIS — I69320 Aphasia following cerebral infarction: Secondary | ICD-10-CM | POA: Diagnosis not present

## 2020-06-10 DIAGNOSIS — F419 Anxiety disorder, unspecified: Secondary | ICD-10-CM | POA: Diagnosis not present

## 2020-06-10 DIAGNOSIS — I69354 Hemiplegia and hemiparesis following cerebral infarction affecting left non-dominant side: Secondary | ICD-10-CM | POA: Diagnosis not present

## 2020-06-10 DIAGNOSIS — J449 Chronic obstructive pulmonary disease, unspecified: Secondary | ICD-10-CM | POA: Diagnosis not present

## 2020-06-13 DIAGNOSIS — I69391 Dysphagia following cerebral infarction: Secondary | ICD-10-CM | POA: Diagnosis not present

## 2020-06-13 DIAGNOSIS — R630 Anorexia: Secondary | ICD-10-CM | POA: Diagnosis not present

## 2020-06-13 DIAGNOSIS — J449 Chronic obstructive pulmonary disease, unspecified: Secondary | ICD-10-CM | POA: Diagnosis not present

## 2020-06-13 DIAGNOSIS — I69320 Aphasia following cerebral infarction: Secondary | ICD-10-CM | POA: Diagnosis not present

## 2020-06-13 DIAGNOSIS — F419 Anxiety disorder, unspecified: Secondary | ICD-10-CM | POA: Diagnosis not present

## 2020-06-13 DIAGNOSIS — I69354 Hemiplegia and hemiparesis following cerebral infarction affecting left non-dominant side: Secondary | ICD-10-CM | POA: Diagnosis not present

## 2020-06-14 DIAGNOSIS — I69391 Dysphagia following cerebral infarction: Secondary | ICD-10-CM | POA: Diagnosis not present

## 2020-06-14 DIAGNOSIS — I69354 Hemiplegia and hemiparesis following cerebral infarction affecting left non-dominant side: Secondary | ICD-10-CM | POA: Diagnosis not present

## 2020-06-14 DIAGNOSIS — I69320 Aphasia following cerebral infarction: Secondary | ICD-10-CM | POA: Diagnosis not present

## 2020-06-14 DIAGNOSIS — J449 Chronic obstructive pulmonary disease, unspecified: Secondary | ICD-10-CM | POA: Diagnosis not present

## 2020-06-14 DIAGNOSIS — F419 Anxiety disorder, unspecified: Secondary | ICD-10-CM | POA: Diagnosis not present

## 2020-06-14 DIAGNOSIS — R630 Anorexia: Secondary | ICD-10-CM | POA: Diagnosis not present

## 2020-06-16 DIAGNOSIS — R630 Anorexia: Secondary | ICD-10-CM | POA: Diagnosis not present

## 2020-06-16 DIAGNOSIS — F419 Anxiety disorder, unspecified: Secondary | ICD-10-CM | POA: Diagnosis not present

## 2020-06-16 DIAGNOSIS — J449 Chronic obstructive pulmonary disease, unspecified: Secondary | ICD-10-CM | POA: Diagnosis not present

## 2020-06-16 DIAGNOSIS — I69354 Hemiplegia and hemiparesis following cerebral infarction affecting left non-dominant side: Secondary | ICD-10-CM | POA: Diagnosis not present

## 2020-06-16 DIAGNOSIS — I69320 Aphasia following cerebral infarction: Secondary | ICD-10-CM | POA: Diagnosis not present

## 2020-06-16 DIAGNOSIS — I69391 Dysphagia following cerebral infarction: Secondary | ICD-10-CM | POA: Diagnosis not present

## 2020-06-17 DIAGNOSIS — I69391 Dysphagia following cerebral infarction: Secondary | ICD-10-CM | POA: Diagnosis not present

## 2020-06-17 DIAGNOSIS — I69320 Aphasia following cerebral infarction: Secondary | ICD-10-CM | POA: Diagnosis not present

## 2020-06-17 DIAGNOSIS — I69354 Hemiplegia and hemiparesis following cerebral infarction affecting left non-dominant side: Secondary | ICD-10-CM | POA: Diagnosis not present

## 2020-06-17 DIAGNOSIS — J449 Chronic obstructive pulmonary disease, unspecified: Secondary | ICD-10-CM | POA: Diagnosis not present

## 2020-06-17 DIAGNOSIS — R630 Anorexia: Secondary | ICD-10-CM | POA: Diagnosis not present

## 2020-06-17 DIAGNOSIS — F419 Anxiety disorder, unspecified: Secondary | ICD-10-CM | POA: Diagnosis not present

## 2020-06-21 DIAGNOSIS — I69320 Aphasia following cerebral infarction: Secondary | ICD-10-CM | POA: Diagnosis not present

## 2020-06-21 DIAGNOSIS — I69391 Dysphagia following cerebral infarction: Secondary | ICD-10-CM | POA: Diagnosis not present

## 2020-06-21 DIAGNOSIS — J449 Chronic obstructive pulmonary disease, unspecified: Secondary | ICD-10-CM | POA: Diagnosis not present

## 2020-06-21 DIAGNOSIS — R630 Anorexia: Secondary | ICD-10-CM | POA: Diagnosis not present

## 2020-06-21 DIAGNOSIS — I69354 Hemiplegia and hemiparesis following cerebral infarction affecting left non-dominant side: Secondary | ICD-10-CM | POA: Diagnosis not present

## 2020-06-21 DIAGNOSIS — F419 Anxiety disorder, unspecified: Secondary | ICD-10-CM | POA: Diagnosis not present

## 2020-06-22 DIAGNOSIS — R630 Anorexia: Secondary | ICD-10-CM | POA: Diagnosis not present

## 2020-06-22 DIAGNOSIS — J449 Chronic obstructive pulmonary disease, unspecified: Secondary | ICD-10-CM | POA: Diagnosis not present

## 2020-06-22 DIAGNOSIS — I69391 Dysphagia following cerebral infarction: Secondary | ICD-10-CM | POA: Diagnosis not present

## 2020-06-22 DIAGNOSIS — I69320 Aphasia following cerebral infarction: Secondary | ICD-10-CM | POA: Diagnosis not present

## 2020-06-22 DIAGNOSIS — F419 Anxiety disorder, unspecified: Secondary | ICD-10-CM | POA: Diagnosis not present

## 2020-06-22 DIAGNOSIS — I69354 Hemiplegia and hemiparesis following cerebral infarction affecting left non-dominant side: Secondary | ICD-10-CM | POA: Diagnosis not present

## 2020-06-23 DIAGNOSIS — I69320 Aphasia following cerebral infarction: Secondary | ICD-10-CM | POA: Diagnosis not present

## 2020-06-23 DIAGNOSIS — F419 Anxiety disorder, unspecified: Secondary | ICD-10-CM | POA: Diagnosis not present

## 2020-06-23 DIAGNOSIS — I69391 Dysphagia following cerebral infarction: Secondary | ICD-10-CM | POA: Diagnosis not present

## 2020-06-23 DIAGNOSIS — R630 Anorexia: Secondary | ICD-10-CM | POA: Diagnosis not present

## 2020-06-23 DIAGNOSIS — J449 Chronic obstructive pulmonary disease, unspecified: Secondary | ICD-10-CM | POA: Diagnosis not present

## 2020-06-23 DIAGNOSIS — I69354 Hemiplegia and hemiparesis following cerebral infarction affecting left non-dominant side: Secondary | ICD-10-CM | POA: Diagnosis not present

## 2020-06-28 DIAGNOSIS — R630 Anorexia: Secondary | ICD-10-CM | POA: Diagnosis not present

## 2020-06-28 DIAGNOSIS — I69354 Hemiplegia and hemiparesis following cerebral infarction affecting left non-dominant side: Secondary | ICD-10-CM | POA: Diagnosis not present

## 2020-06-28 DIAGNOSIS — I69391 Dysphagia following cerebral infarction: Secondary | ICD-10-CM | POA: Diagnosis not present

## 2020-06-28 DIAGNOSIS — J449 Chronic obstructive pulmonary disease, unspecified: Secondary | ICD-10-CM | POA: Diagnosis not present

## 2020-06-28 DIAGNOSIS — I69320 Aphasia following cerebral infarction: Secondary | ICD-10-CM | POA: Diagnosis not present

## 2020-06-28 DIAGNOSIS — F419 Anxiety disorder, unspecified: Secondary | ICD-10-CM | POA: Diagnosis not present

## 2020-06-30 DIAGNOSIS — I69354 Hemiplegia and hemiparesis following cerebral infarction affecting left non-dominant side: Secondary | ICD-10-CM | POA: Diagnosis not present

## 2020-06-30 DIAGNOSIS — F419 Anxiety disorder, unspecified: Secondary | ICD-10-CM | POA: Diagnosis not present

## 2020-06-30 DIAGNOSIS — J449 Chronic obstructive pulmonary disease, unspecified: Secondary | ICD-10-CM | POA: Diagnosis not present

## 2020-06-30 DIAGNOSIS — I69391 Dysphagia following cerebral infarction: Secondary | ICD-10-CM | POA: Diagnosis not present

## 2020-06-30 DIAGNOSIS — R630 Anorexia: Secondary | ICD-10-CM | POA: Diagnosis not present

## 2020-06-30 DIAGNOSIS — I69320 Aphasia following cerebral infarction: Secondary | ICD-10-CM | POA: Diagnosis not present

## 2020-07-01 DIAGNOSIS — R630 Anorexia: Secondary | ICD-10-CM | POA: Diagnosis not present

## 2020-07-01 DIAGNOSIS — F419 Anxiety disorder, unspecified: Secondary | ICD-10-CM | POA: Diagnosis not present

## 2020-07-01 DIAGNOSIS — I69354 Hemiplegia and hemiparesis following cerebral infarction affecting left non-dominant side: Secondary | ICD-10-CM | POA: Diagnosis not present

## 2020-07-01 DIAGNOSIS — J449 Chronic obstructive pulmonary disease, unspecified: Secondary | ICD-10-CM | POA: Diagnosis not present

## 2020-07-01 DIAGNOSIS — I69320 Aphasia following cerebral infarction: Secondary | ICD-10-CM | POA: Diagnosis not present

## 2020-07-01 DIAGNOSIS — I69391 Dysphagia following cerebral infarction: Secondary | ICD-10-CM | POA: Diagnosis not present

## 2020-07-02 DIAGNOSIS — R630 Anorexia: Secondary | ICD-10-CM | POA: Diagnosis not present

## 2020-07-02 DIAGNOSIS — L89890 Pressure ulcer of other site, unstageable: Secondary | ICD-10-CM | POA: Diagnosis not present

## 2020-07-02 DIAGNOSIS — I959 Hypotension, unspecified: Secondary | ICD-10-CM | POA: Diagnosis not present

## 2020-07-02 DIAGNOSIS — R208 Other disturbances of skin sensation: Secondary | ICD-10-CM | POA: Diagnosis not present

## 2020-07-02 DIAGNOSIS — F32A Depression, unspecified: Secondary | ICD-10-CM | POA: Diagnosis not present

## 2020-07-02 DIAGNOSIS — J449 Chronic obstructive pulmonary disease, unspecified: Secondary | ICD-10-CM | POA: Diagnosis not present

## 2020-07-02 DIAGNOSIS — R230 Cyanosis: Secondary | ICD-10-CM | POA: Diagnosis not present

## 2020-07-02 DIAGNOSIS — L89623 Pressure ulcer of left heel, stage 3: Secondary | ICD-10-CM | POA: Diagnosis not present

## 2020-07-02 DIAGNOSIS — R634 Abnormal weight loss: Secondary | ICD-10-CM | POA: Diagnosis not present

## 2020-07-02 DIAGNOSIS — I69320 Aphasia following cerebral infarction: Secondary | ICD-10-CM | POA: Diagnosis not present

## 2020-07-02 DIAGNOSIS — R0902 Hypoxemia: Secondary | ICD-10-CM | POA: Diagnosis not present

## 2020-07-02 DIAGNOSIS — Z7401 Bed confinement status: Secondary | ICD-10-CM | POA: Diagnosis not present

## 2020-07-02 DIAGNOSIS — L97529 Non-pressure chronic ulcer of other part of left foot with unspecified severity: Secondary | ICD-10-CM | POA: Diagnosis not present

## 2020-07-02 DIAGNOSIS — F419 Anxiety disorder, unspecified: Secondary | ICD-10-CM | POA: Diagnosis not present

## 2020-07-02 DIAGNOSIS — R4586 Emotional lability: Secondary | ICD-10-CM | POA: Diagnosis not present

## 2020-07-02 DIAGNOSIS — M24542 Contracture, left hand: Secondary | ICD-10-CM | POA: Diagnosis not present

## 2020-07-02 DIAGNOSIS — I69354 Hemiplegia and hemiparesis following cerebral infarction affecting left non-dominant side: Secondary | ICD-10-CM | POA: Diagnosis not present

## 2020-07-02 DIAGNOSIS — I69391 Dysphagia following cerebral infarction: Secondary | ICD-10-CM | POA: Diagnosis not present

## 2020-07-02 DIAGNOSIS — R569 Unspecified convulsions: Secondary | ICD-10-CM | POA: Diagnosis not present

## 2020-07-02 DIAGNOSIS — R600 Localized edema: Secondary | ICD-10-CM | POA: Diagnosis not present

## 2020-07-02 DIAGNOSIS — G8929 Other chronic pain: Secondary | ICD-10-CM | POA: Diagnosis not present

## 2020-07-02 DIAGNOSIS — M21372 Foot drop, left foot: Secondary | ICD-10-CM | POA: Diagnosis not present

## 2020-07-02 DIAGNOSIS — M21371 Foot drop, right foot: Secondary | ICD-10-CM | POA: Diagnosis not present

## 2020-07-02 DIAGNOSIS — R627 Adult failure to thrive: Secondary | ICD-10-CM | POA: Diagnosis not present

## 2020-07-02 DIAGNOSIS — F209 Schizophrenia, unspecified: Secondary | ICD-10-CM | POA: Diagnosis not present

## 2020-07-04 DIAGNOSIS — R630 Anorexia: Secondary | ICD-10-CM | POA: Diagnosis not present

## 2020-07-04 DIAGNOSIS — I69320 Aphasia following cerebral infarction: Secondary | ICD-10-CM | POA: Diagnosis not present

## 2020-07-04 DIAGNOSIS — J449 Chronic obstructive pulmonary disease, unspecified: Secondary | ICD-10-CM | POA: Diagnosis not present

## 2020-07-04 DIAGNOSIS — I69391 Dysphagia following cerebral infarction: Secondary | ICD-10-CM | POA: Diagnosis not present

## 2020-07-04 DIAGNOSIS — F419 Anxiety disorder, unspecified: Secondary | ICD-10-CM | POA: Diagnosis not present

## 2020-07-04 DIAGNOSIS — I69354 Hemiplegia and hemiparesis following cerebral infarction affecting left non-dominant side: Secondary | ICD-10-CM | POA: Diagnosis not present

## 2020-07-05 DIAGNOSIS — I69354 Hemiplegia and hemiparesis following cerebral infarction affecting left non-dominant side: Secondary | ICD-10-CM | POA: Diagnosis not present

## 2020-07-05 DIAGNOSIS — R630 Anorexia: Secondary | ICD-10-CM | POA: Diagnosis not present

## 2020-07-05 DIAGNOSIS — J449 Chronic obstructive pulmonary disease, unspecified: Secondary | ICD-10-CM | POA: Diagnosis not present

## 2020-07-05 DIAGNOSIS — I69391 Dysphagia following cerebral infarction: Secondary | ICD-10-CM | POA: Diagnosis not present

## 2020-07-05 DIAGNOSIS — F419 Anxiety disorder, unspecified: Secondary | ICD-10-CM | POA: Diagnosis not present

## 2020-07-05 DIAGNOSIS — I69320 Aphasia following cerebral infarction: Secondary | ICD-10-CM | POA: Diagnosis not present

## 2020-07-07 DIAGNOSIS — I69320 Aphasia following cerebral infarction: Secondary | ICD-10-CM | POA: Diagnosis not present

## 2020-07-07 DIAGNOSIS — J449 Chronic obstructive pulmonary disease, unspecified: Secondary | ICD-10-CM | POA: Diagnosis not present

## 2020-07-07 DIAGNOSIS — I69391 Dysphagia following cerebral infarction: Secondary | ICD-10-CM | POA: Diagnosis not present

## 2020-07-07 DIAGNOSIS — I69354 Hemiplegia and hemiparesis following cerebral infarction affecting left non-dominant side: Secondary | ICD-10-CM | POA: Diagnosis not present

## 2020-07-07 DIAGNOSIS — F419 Anxiety disorder, unspecified: Secondary | ICD-10-CM | POA: Diagnosis not present

## 2020-07-07 DIAGNOSIS — R630 Anorexia: Secondary | ICD-10-CM | POA: Diagnosis not present

## 2020-07-08 DIAGNOSIS — F419 Anxiety disorder, unspecified: Secondary | ICD-10-CM | POA: Diagnosis not present

## 2020-07-08 DIAGNOSIS — I69320 Aphasia following cerebral infarction: Secondary | ICD-10-CM | POA: Diagnosis not present

## 2020-07-08 DIAGNOSIS — R630 Anorexia: Secondary | ICD-10-CM | POA: Diagnosis not present

## 2020-07-08 DIAGNOSIS — I69391 Dysphagia following cerebral infarction: Secondary | ICD-10-CM | POA: Diagnosis not present

## 2020-07-08 DIAGNOSIS — J449 Chronic obstructive pulmonary disease, unspecified: Secondary | ICD-10-CM | POA: Diagnosis not present

## 2020-07-08 DIAGNOSIS — I69354 Hemiplegia and hemiparesis following cerebral infarction affecting left non-dominant side: Secondary | ICD-10-CM | POA: Diagnosis not present

## 2020-07-12 DIAGNOSIS — J449 Chronic obstructive pulmonary disease, unspecified: Secondary | ICD-10-CM | POA: Diagnosis not present

## 2020-07-12 DIAGNOSIS — R630 Anorexia: Secondary | ICD-10-CM | POA: Diagnosis not present

## 2020-07-12 DIAGNOSIS — I69320 Aphasia following cerebral infarction: Secondary | ICD-10-CM | POA: Diagnosis not present

## 2020-07-12 DIAGNOSIS — I69391 Dysphagia following cerebral infarction: Secondary | ICD-10-CM | POA: Diagnosis not present

## 2020-07-12 DIAGNOSIS — F419 Anxiety disorder, unspecified: Secondary | ICD-10-CM | POA: Diagnosis not present

## 2020-07-12 DIAGNOSIS — I69354 Hemiplegia and hemiparesis following cerebral infarction affecting left non-dominant side: Secondary | ICD-10-CM | POA: Diagnosis not present

## 2020-07-14 DIAGNOSIS — F419 Anxiety disorder, unspecified: Secondary | ICD-10-CM | POA: Diagnosis not present

## 2020-07-14 DIAGNOSIS — I69320 Aphasia following cerebral infarction: Secondary | ICD-10-CM | POA: Diagnosis not present

## 2020-07-14 DIAGNOSIS — J449 Chronic obstructive pulmonary disease, unspecified: Secondary | ICD-10-CM | POA: Diagnosis not present

## 2020-07-14 DIAGNOSIS — I69354 Hemiplegia and hemiparesis following cerebral infarction affecting left non-dominant side: Secondary | ICD-10-CM | POA: Diagnosis not present

## 2020-07-14 DIAGNOSIS — R630 Anorexia: Secondary | ICD-10-CM | POA: Diagnosis not present

## 2020-07-14 DIAGNOSIS — I69391 Dysphagia following cerebral infarction: Secondary | ICD-10-CM | POA: Diagnosis not present

## 2020-07-15 DIAGNOSIS — R630 Anorexia: Secondary | ICD-10-CM | POA: Diagnosis not present

## 2020-07-15 DIAGNOSIS — I69391 Dysphagia following cerebral infarction: Secondary | ICD-10-CM | POA: Diagnosis not present

## 2020-07-15 DIAGNOSIS — J449 Chronic obstructive pulmonary disease, unspecified: Secondary | ICD-10-CM | POA: Diagnosis not present

## 2020-07-15 DIAGNOSIS — I69320 Aphasia following cerebral infarction: Secondary | ICD-10-CM | POA: Diagnosis not present

## 2020-07-15 DIAGNOSIS — F419 Anxiety disorder, unspecified: Secondary | ICD-10-CM | POA: Diagnosis not present

## 2020-07-15 DIAGNOSIS — I69354 Hemiplegia and hemiparesis following cerebral infarction affecting left non-dominant side: Secondary | ICD-10-CM | POA: Diagnosis not present

## 2020-07-19 DIAGNOSIS — I69320 Aphasia following cerebral infarction: Secondary | ICD-10-CM | POA: Diagnosis not present

## 2020-07-19 DIAGNOSIS — I69354 Hemiplegia and hemiparesis following cerebral infarction affecting left non-dominant side: Secondary | ICD-10-CM | POA: Diagnosis not present

## 2020-07-19 DIAGNOSIS — J449 Chronic obstructive pulmonary disease, unspecified: Secondary | ICD-10-CM | POA: Diagnosis not present

## 2020-07-19 DIAGNOSIS — F419 Anxiety disorder, unspecified: Secondary | ICD-10-CM | POA: Diagnosis not present

## 2020-07-19 DIAGNOSIS — I69391 Dysphagia following cerebral infarction: Secondary | ICD-10-CM | POA: Diagnosis not present

## 2020-07-19 DIAGNOSIS — R630 Anorexia: Secondary | ICD-10-CM | POA: Diagnosis not present

## 2020-07-22 DIAGNOSIS — I69320 Aphasia following cerebral infarction: Secondary | ICD-10-CM | POA: Diagnosis not present

## 2020-07-22 DIAGNOSIS — R630 Anorexia: Secondary | ICD-10-CM | POA: Diagnosis not present

## 2020-07-22 DIAGNOSIS — I69391 Dysphagia following cerebral infarction: Secondary | ICD-10-CM | POA: Diagnosis not present

## 2020-07-22 DIAGNOSIS — J449 Chronic obstructive pulmonary disease, unspecified: Secondary | ICD-10-CM | POA: Diagnosis not present

## 2020-07-22 DIAGNOSIS — F419 Anxiety disorder, unspecified: Secondary | ICD-10-CM | POA: Diagnosis not present

## 2020-07-22 DIAGNOSIS — I69354 Hemiplegia and hemiparesis following cerebral infarction affecting left non-dominant side: Secondary | ICD-10-CM | POA: Diagnosis not present

## 2020-07-25 DIAGNOSIS — F1129 Opioid dependence with unspecified opioid-induced disorder: Secondary | ICD-10-CM

## 2020-07-25 DIAGNOSIS — F200 Paranoid schizophrenia: Secondary | ICD-10-CM | POA: Diagnosis not present

## 2020-07-25 DIAGNOSIS — L97529 Non-pressure chronic ulcer of other part of left foot with unspecified severity: Secondary | ICD-10-CM

## 2020-07-25 DIAGNOSIS — G8194 Hemiplegia, unspecified affecting left nondominant side: Secondary | ICD-10-CM | POA: Diagnosis not present

## 2020-07-25 DIAGNOSIS — J449 Chronic obstructive pulmonary disease, unspecified: Secondary | ICD-10-CM | POA: Diagnosis not present

## 2020-07-25 DIAGNOSIS — F39 Unspecified mood [affective] disorder: Secondary | ICD-10-CM | POA: Diagnosis not present

## 2020-07-26 DIAGNOSIS — I69354 Hemiplegia and hemiparesis following cerebral infarction affecting left non-dominant side: Secondary | ICD-10-CM | POA: Diagnosis not present

## 2020-07-26 DIAGNOSIS — I69320 Aphasia following cerebral infarction: Secondary | ICD-10-CM | POA: Diagnosis not present

## 2020-07-26 DIAGNOSIS — R630 Anorexia: Secondary | ICD-10-CM | POA: Diagnosis not present

## 2020-07-26 DIAGNOSIS — I69391 Dysphagia following cerebral infarction: Secondary | ICD-10-CM | POA: Diagnosis not present

## 2020-07-26 DIAGNOSIS — F419 Anxiety disorder, unspecified: Secondary | ICD-10-CM | POA: Diagnosis not present

## 2020-07-26 DIAGNOSIS — J449 Chronic obstructive pulmonary disease, unspecified: Secondary | ICD-10-CM | POA: Diagnosis not present

## 2020-07-28 DIAGNOSIS — I69391 Dysphagia following cerebral infarction: Secondary | ICD-10-CM | POA: Diagnosis not present

## 2020-07-28 DIAGNOSIS — F419 Anxiety disorder, unspecified: Secondary | ICD-10-CM | POA: Diagnosis not present

## 2020-07-28 DIAGNOSIS — I69320 Aphasia following cerebral infarction: Secondary | ICD-10-CM | POA: Diagnosis not present

## 2020-07-28 DIAGNOSIS — R630 Anorexia: Secondary | ICD-10-CM | POA: Diagnosis not present

## 2020-07-28 DIAGNOSIS — J449 Chronic obstructive pulmonary disease, unspecified: Secondary | ICD-10-CM | POA: Diagnosis not present

## 2020-07-28 DIAGNOSIS — I69354 Hemiplegia and hemiparesis following cerebral infarction affecting left non-dominant side: Secondary | ICD-10-CM | POA: Diagnosis not present

## 2020-07-29 DIAGNOSIS — I69391 Dysphagia following cerebral infarction: Secondary | ICD-10-CM | POA: Diagnosis not present

## 2020-07-29 DIAGNOSIS — I69354 Hemiplegia and hemiparesis following cerebral infarction affecting left non-dominant side: Secondary | ICD-10-CM | POA: Diagnosis not present

## 2020-07-29 DIAGNOSIS — R630 Anorexia: Secondary | ICD-10-CM | POA: Diagnosis not present

## 2020-07-29 DIAGNOSIS — I69320 Aphasia following cerebral infarction: Secondary | ICD-10-CM | POA: Diagnosis not present

## 2020-07-29 DIAGNOSIS — F419 Anxiety disorder, unspecified: Secondary | ICD-10-CM | POA: Diagnosis not present

## 2020-07-29 DIAGNOSIS — J449 Chronic obstructive pulmonary disease, unspecified: Secondary | ICD-10-CM | POA: Diagnosis not present

## 2020-08-02 DIAGNOSIS — R634 Abnormal weight loss: Secondary | ICD-10-CM | POA: Diagnosis not present

## 2020-08-02 DIAGNOSIS — Z7401 Bed confinement status: Secondary | ICD-10-CM | POA: Diagnosis not present

## 2020-08-02 DIAGNOSIS — L89890 Pressure ulcer of other site, unstageable: Secondary | ICD-10-CM | POA: Diagnosis not present

## 2020-08-02 DIAGNOSIS — R0902 Hypoxemia: Secondary | ICD-10-CM | POA: Diagnosis not present

## 2020-08-02 DIAGNOSIS — L97529 Non-pressure chronic ulcer of other part of left foot with unspecified severity: Secondary | ICD-10-CM | POA: Diagnosis not present

## 2020-08-02 DIAGNOSIS — R630 Anorexia: Secondary | ICD-10-CM | POA: Diagnosis not present

## 2020-08-02 DIAGNOSIS — L89623 Pressure ulcer of left heel, stage 3: Secondary | ICD-10-CM | POA: Diagnosis not present

## 2020-08-02 DIAGNOSIS — G8929 Other chronic pain: Secondary | ICD-10-CM | POA: Diagnosis not present

## 2020-08-02 DIAGNOSIS — I959 Hypotension, unspecified: Secondary | ICD-10-CM | POA: Diagnosis not present

## 2020-08-02 DIAGNOSIS — R230 Cyanosis: Secondary | ICD-10-CM | POA: Diagnosis not present

## 2020-08-02 DIAGNOSIS — M21371 Foot drop, right foot: Secondary | ICD-10-CM | POA: Diagnosis not present

## 2020-08-02 DIAGNOSIS — R627 Adult failure to thrive: Secondary | ICD-10-CM | POA: Diagnosis not present

## 2020-08-02 DIAGNOSIS — I69354 Hemiplegia and hemiparesis following cerebral infarction affecting left non-dominant side: Secondary | ICD-10-CM | POA: Diagnosis not present

## 2020-08-02 DIAGNOSIS — F32A Depression, unspecified: Secondary | ICD-10-CM | POA: Diagnosis not present

## 2020-08-02 DIAGNOSIS — F209 Schizophrenia, unspecified: Secondary | ICD-10-CM | POA: Diagnosis not present

## 2020-08-02 DIAGNOSIS — I69320 Aphasia following cerebral infarction: Secondary | ICD-10-CM | POA: Diagnosis not present

## 2020-08-02 DIAGNOSIS — M24542 Contracture, left hand: Secondary | ICD-10-CM | POA: Diagnosis not present

## 2020-08-02 DIAGNOSIS — R4586 Emotional lability: Secondary | ICD-10-CM | POA: Diagnosis not present

## 2020-08-02 DIAGNOSIS — M21372 Foot drop, left foot: Secondary | ICD-10-CM | POA: Diagnosis not present

## 2020-08-02 DIAGNOSIS — J449 Chronic obstructive pulmonary disease, unspecified: Secondary | ICD-10-CM | POA: Diagnosis not present

## 2020-08-02 DIAGNOSIS — F419 Anxiety disorder, unspecified: Secondary | ICD-10-CM | POA: Diagnosis not present

## 2020-08-02 DIAGNOSIS — R569 Unspecified convulsions: Secondary | ICD-10-CM | POA: Diagnosis not present

## 2020-08-02 DIAGNOSIS — R600 Localized edema: Secondary | ICD-10-CM | POA: Diagnosis not present

## 2020-08-02 DIAGNOSIS — R208 Other disturbances of skin sensation: Secondary | ICD-10-CM | POA: Diagnosis not present

## 2020-08-02 DIAGNOSIS — I69391 Dysphagia following cerebral infarction: Secondary | ICD-10-CM | POA: Diagnosis not present

## 2020-08-03 DIAGNOSIS — F419 Anxiety disorder, unspecified: Secondary | ICD-10-CM | POA: Diagnosis not present

## 2020-08-03 DIAGNOSIS — J449 Chronic obstructive pulmonary disease, unspecified: Secondary | ICD-10-CM | POA: Diagnosis not present

## 2020-08-03 DIAGNOSIS — R630 Anorexia: Secondary | ICD-10-CM | POA: Diagnosis not present

## 2020-08-03 DIAGNOSIS — I69391 Dysphagia following cerebral infarction: Secondary | ICD-10-CM | POA: Diagnosis not present

## 2020-08-03 DIAGNOSIS — I69320 Aphasia following cerebral infarction: Secondary | ICD-10-CM | POA: Diagnosis not present

## 2020-08-03 DIAGNOSIS — I69354 Hemiplegia and hemiparesis following cerebral infarction affecting left non-dominant side: Secondary | ICD-10-CM | POA: Diagnosis not present

## 2020-08-05 DIAGNOSIS — I69320 Aphasia following cerebral infarction: Secondary | ICD-10-CM | POA: Diagnosis not present

## 2020-08-05 DIAGNOSIS — F419 Anxiety disorder, unspecified: Secondary | ICD-10-CM | POA: Diagnosis not present

## 2020-08-05 DIAGNOSIS — J449 Chronic obstructive pulmonary disease, unspecified: Secondary | ICD-10-CM | POA: Diagnosis not present

## 2020-08-05 DIAGNOSIS — R630 Anorexia: Secondary | ICD-10-CM | POA: Diagnosis not present

## 2020-08-05 DIAGNOSIS — I69354 Hemiplegia and hemiparesis following cerebral infarction affecting left non-dominant side: Secondary | ICD-10-CM | POA: Diagnosis not present

## 2020-08-05 DIAGNOSIS — I69391 Dysphagia following cerebral infarction: Secondary | ICD-10-CM | POA: Diagnosis not present

## 2020-08-09 DIAGNOSIS — I69391 Dysphagia following cerebral infarction: Secondary | ICD-10-CM | POA: Diagnosis not present

## 2020-08-09 DIAGNOSIS — F419 Anxiety disorder, unspecified: Secondary | ICD-10-CM | POA: Diagnosis not present

## 2020-08-09 DIAGNOSIS — I69320 Aphasia following cerebral infarction: Secondary | ICD-10-CM | POA: Diagnosis not present

## 2020-08-09 DIAGNOSIS — I69354 Hemiplegia and hemiparesis following cerebral infarction affecting left non-dominant side: Secondary | ICD-10-CM | POA: Diagnosis not present

## 2020-08-09 DIAGNOSIS — R630 Anorexia: Secondary | ICD-10-CM | POA: Diagnosis not present

## 2020-08-09 DIAGNOSIS — J449 Chronic obstructive pulmonary disease, unspecified: Secondary | ICD-10-CM | POA: Diagnosis not present

## 2020-08-11 DIAGNOSIS — F419 Anxiety disorder, unspecified: Secondary | ICD-10-CM | POA: Diagnosis not present

## 2020-08-11 DIAGNOSIS — R630 Anorexia: Secondary | ICD-10-CM | POA: Diagnosis not present

## 2020-08-11 DIAGNOSIS — I69391 Dysphagia following cerebral infarction: Secondary | ICD-10-CM | POA: Diagnosis not present

## 2020-08-11 DIAGNOSIS — I69320 Aphasia following cerebral infarction: Secondary | ICD-10-CM | POA: Diagnosis not present

## 2020-08-11 DIAGNOSIS — J449 Chronic obstructive pulmonary disease, unspecified: Secondary | ICD-10-CM | POA: Diagnosis not present

## 2020-08-11 DIAGNOSIS — I69354 Hemiplegia and hemiparesis following cerebral infarction affecting left non-dominant side: Secondary | ICD-10-CM | POA: Diagnosis not present

## 2020-08-16 DIAGNOSIS — L03032 Cellulitis of left toe: Secondary | ICD-10-CM | POA: Diagnosis not present

## 2020-08-16 DIAGNOSIS — R630 Anorexia: Secondary | ICD-10-CM | POA: Diagnosis not present

## 2020-08-16 DIAGNOSIS — J449 Chronic obstructive pulmonary disease, unspecified: Secondary | ICD-10-CM | POA: Diagnosis not present

## 2020-08-16 DIAGNOSIS — I69354 Hemiplegia and hemiparesis following cerebral infarction affecting left non-dominant side: Secondary | ICD-10-CM | POA: Diagnosis not present

## 2020-08-16 DIAGNOSIS — I69391 Dysphagia following cerebral infarction: Secondary | ICD-10-CM | POA: Diagnosis not present

## 2020-08-16 DIAGNOSIS — I69320 Aphasia following cerebral infarction: Secondary | ICD-10-CM | POA: Diagnosis not present

## 2020-08-16 DIAGNOSIS — F419 Anxiety disorder, unspecified: Secondary | ICD-10-CM | POA: Diagnosis not present

## 2020-08-24 ENCOUNTER — Other Ambulatory Visit: Payer: Self-pay

## 2020-08-24 ENCOUNTER — Non-Acute Institutional Stay: Payer: Self-pay | Admitting: Adult Health Nurse Practitioner

## 2020-08-24 DIAGNOSIS — R4701 Aphasia: Secondary | ICD-10-CM | POA: Diagnosis not present

## 2020-08-24 DIAGNOSIS — F3175 Bipolar disorder, in partial remission, most recent episode depressed: Secondary | ICD-10-CM

## 2020-08-24 DIAGNOSIS — Z515 Encounter for palliative care: Secondary | ICD-10-CM

## 2020-08-24 DIAGNOSIS — I69354 Hemiplegia and hemiparesis following cerebral infarction affecting left non-dominant side: Secondary | ICD-10-CM

## 2020-08-24 DIAGNOSIS — J449 Chronic obstructive pulmonary disease, unspecified: Secondary | ICD-10-CM

## 2020-08-24 DIAGNOSIS — I6930 Unspecified sequelae of cerebral infarction: Secondary | ICD-10-CM | POA: Diagnosis not present

## 2020-08-24 NOTE — Progress Notes (Signed)
Therapist, nutritional Palliative Care Consult Note Telephone: [phone]  Fax: [phone]  PATIENT NAME: Diana Mccoy DOB: 1946/01/29 MRN: [id number]  PRIMARY CARE PROVIDER:   Karie Schwalbe, MD  REFERRING PROVIDER:  Karie Schwalbe, MD [address] [place],  [place] [address]  RESPONSIBLE PARTY:   Glynis Smiles, daughter [phone]  [email or web address]  Chief complaint: initial palliative visit/discharge from hospice    RECOMMENDATIONS and PLAN:  1.  Advanced care planning.  Patient is DNR.  Will call daughter to update on visit  2.  Deficits s/p CVA/TIAs.  Patient has expressive aphasia and left sided hemiplegia related to past. She is total care and requires lift for transfers. No reported falls.  She does seem to understand provider questions sometimes.  Unable to determine if hard of hearing or cognitive impairment. Suspect both. Continue supportive care at facility.    3.  COPD.  Patient is not active and no complaints of SOB.  Not O2 dependent.  Patient stable.  Continue current plan of care  4.  Bipolar.  Patient appears stable.  Continue current plan of care.    Recently discharged from hospice due to stability.  Patient at risk for future decline and palliative will continue to monitor for symptom management/decline and make recommendations as needed.  Follow up in 6-8 weeks.    I spent 45 minutes providing this consultation, including time with patient/family, provider coordination, chart review, documentation. More than 50% of the time in this consultation was spent coordinating communication.   HISTORY OF PRESENT ILLNESS:  Diana Mccoy is a 75 y.o. year old female with multiple medical problems including affective bipolar disorder, HTN, anxiety, COPD, OA, presbyesophagus, scoliosis, spinal stenosis, left hemiplegia and expressive aphasia s/p CVA. Palliative Care was asked to help address goals of care. Patient discharged from  hospice services 08/17/2020 due to weight gain and resolved wounds.  Patient is resident of [facility].  Performed chart review.  No recent labs or imaging to review since patient had been under hospice services.   Patient is total care and requires lift for transfers.  She is able to feed herself.  Weight is 135.7 with BMI of 22.6. Current arm circ of 24cm and thigh circ of 37.5 cm. She has history of CVA with resultant left sided weakness and expressive aphasia.  Staff reports that she has vascular wound to top of left foot.  She would not let provider look at her feet.  Patient unable to contribute HPI/ROS related to expressive aphasia and suspect some cognitive impairment.  Staff have no new concerns.    CODE STATUS: DNR  PPS: 30% HOSPICE ELIGIBILITY/DIAGNOSIS: TBD  PHYSICAL EXAM:  HR 80 O2 93% on RA General: NAD, frail appearing Eyes: sclera anicteric and noninjected with no discharge noted Cardiovascular: regular rate and rhythm Pulmonary: clear ant fields; normal respiratory effort Abdomen: soft, nontender, + bowel sounds Extremities: Patient did not want provider to uncover feet and legs. Per nurses report has vascular wound to top of left foot and per hospice notes has bilateral foot drop.  Noted left hand contracture Skin: no rashes on exposed skin Neurological: Weakness; patient has expressive aphasia, unable to assess orientation   PAST MEDICAL HISTORY:  Past Medical History:  Diagnosis Date  . Acid reflux 12/28/2014  . 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 Anxiety 12/28/2014  . BP (high blood pressure) 12/28/2014  . CAFL (chronic airflow limitation) (HCC) 12/28/2014  . COPD (  chronic obstructive pulmonary disease) (HCC) 01/19/2016  . Depression 03/28/2015  . 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 Disorder of kidney 12/28/2014  . Gait instability 04/18/2015  . Osteoarthritis 03/28/2015  . 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 Schizophrenia (HCC) 03/28/2015  .  Scoliosis 12/28/2014  . Spinal stenosis 12/28/2014    SOCIAL HX:  Social History   Tobacco Use  . Smoking status: Current Every Day Smoker    Packs/day: 1.00    Years: 30.00    Pack years: 30.00    Types: Cigarettes  . Smokeless tobacco: Never Used  Substance Use Topics  . Alcohol use: No    ALLERGIES:  Allergies  Allergen Reactions  . Ciprofloxacin Nausea And Vomiting  . Penicillins     rash, hives  . Sodium Thiosulfate   . Sulfa Antibiotics      PERTINENT MEDICATIONS:  Outpatient Encounter Medications as of 08/24/2020  Medication Sig  . acetaminophen (TYLENOL) 325 MG tablet Take 650 mg by mouth.  Marland Kitchen aspirin EC 81 MG EC tablet Take 1 tablet (81 mg total) by mouth daily.  . bisacodyl (DULCOLAX) 10 MG suppository Place 10 mg rectally daily as needed for moderate constipation.  . fentaNYL (DURAGESIC - DOSED MCG/HR) 12 MCG/HR Place 12.5 mcg onto the skin every 3 (three) days.  . hyoscyamine (LEVSIN SL) 0.125 MG SL tablet Place 0.125-0.25 mg under the tongue every 4 (four) hours as needed.  Marland Kitchen ipratropium-albuterol (DUONEB) 0.5-2.5 (3) MG/3ML SOLN Take 3 mLs by nebulization every 4 (four) hours as needed.  . Lidocaine (ASPERCREME LIDOCAINE) 4 % PTCH Apply 1 Package topically at bedtime. Apply to lower back. (12 hours on 12 hours off)  . Lidocaine 4 % PTCH Place 1 patch onto the skin daily. Remove & Discard patch within 12 hours or as directed by MD  . LORazepam (ATIVAN) 0.5 MG tablet Take 0.5 mg by mouth daily.  Marland Kitchen morphine 20 MG/5ML solution Take 5-10 mg by mouth every hour as needed for pain.  Marland Kitchen ondansetron (ZOFRAN-ODT) 8 MG disintegrating tablet Take 8 mg by mouth every 8 (eight) hours as needed for nausea or vomiting.  . sertraline (ZOLOFT) 50 MG tablet Take 50 mg by mouth daily.   No facility-administered encounter medications on file as of 08/24/2020.     Amit Leece Marlena Clipper, NP

## 2020-08-25 ENCOUNTER — Telehealth: Payer: Self-pay | Admitting: Adult Health Nurse Practitioner

## 2020-08-25 NOTE — Telephone Encounter (Signed)
Called daughter, Glynis Smiles, to update on yesterday's visit.  Left VM with reason for call and call back info Floyed Masoud K. Garner Nash NP

## 2020-09-21 DIAGNOSIS — F39 Unspecified mood [affective] disorder: Secondary | ICD-10-CM | POA: Diagnosis not present

## 2020-09-21 DIAGNOSIS — F200 Paranoid schizophrenia: Secondary | ICD-10-CM | POA: Diagnosis not present

## 2020-09-21 DIAGNOSIS — M48061 Spinal stenosis, lumbar region without neurogenic claudication: Secondary | ICD-10-CM | POA: Diagnosis not present

## 2020-09-21 DIAGNOSIS — Z79891 Long term (current) use of opiate analgesic: Secondary | ICD-10-CM | POA: Diagnosis not present

## 2020-09-21 DIAGNOSIS — J449 Chronic obstructive pulmonary disease, unspecified: Secondary | ICD-10-CM | POA: Diagnosis not present

## 2020-09-21 DIAGNOSIS — G40909 Epilepsy, unspecified, not intractable, without status epilepticus: Secondary | ICD-10-CM | POA: Diagnosis not present

## 2020-09-21 DIAGNOSIS — I69954 Hemiplegia and hemiparesis following unspecified cerebrovascular disease affecting left non-dominant side: Secondary | ICD-10-CM | POA: Diagnosis not present

## 2020-09-21 DIAGNOSIS — B351 Tinea unguium: Secondary | ICD-10-CM | POA: Diagnosis not present

## 2020-09-21 DIAGNOSIS — L97522 Non-pressure chronic ulcer of other part of left foot with fat layer exposed: Secondary | ICD-10-CM | POA: Diagnosis not present

## 2020-11-07 DIAGNOSIS — M4807 Spinal stenosis, lumbosacral region: Secondary | ICD-10-CM | POA: Diagnosis not present

## 2020-11-07 DIAGNOSIS — L97529 Non-pressure chronic ulcer of other part of left foot with unspecified severity: Secondary | ICD-10-CM | POA: Diagnosis not present

## 2020-11-07 DIAGNOSIS — J449 Chronic obstructive pulmonary disease, unspecified: Secondary | ICD-10-CM | POA: Diagnosis not present

## 2020-11-07 DIAGNOSIS — F200 Paranoid schizophrenia: Secondary | ICD-10-CM | POA: Diagnosis not present

## 2020-11-07 DIAGNOSIS — F39 Unspecified mood [affective] disorder: Secondary | ICD-10-CM | POA: Diagnosis not present

## 2020-11-07 DIAGNOSIS — G8194 Hemiplegia, unspecified affecting left nondominant side: Secondary | ICD-10-CM | POA: Diagnosis not present

## 2020-11-11 ENCOUNTER — Encounter: Payer: Self-pay | Admitting: Adult Health Nurse Practitioner

## 2020-11-11 ENCOUNTER — Other Ambulatory Visit: Payer: Self-pay

## 2020-11-11 ENCOUNTER — Non-Acute Institutional Stay: Payer: Medicare Other | Admitting: Adult Health Nurse Practitioner

## 2020-11-11 VITALS — HR 82

## 2020-11-11 DIAGNOSIS — R4701 Aphasia: Secondary | ICD-10-CM | POA: Diagnosis not present

## 2020-11-11 DIAGNOSIS — I69354 Hemiplegia and hemiparesis following cerebral infarction affecting left non-dominant side: Secondary | ICD-10-CM

## 2020-11-11 DIAGNOSIS — L97529 Non-pressure chronic ulcer of other part of left foot with unspecified severity: Secondary | ICD-10-CM | POA: Diagnosis not present

## 2020-11-11 DIAGNOSIS — Z515 Encounter for palliative care: Secondary | ICD-10-CM | POA: Diagnosis not present

## 2020-11-11 NOTE — Progress Notes (Signed)
Designer, jewellery Palliative Care Consult Note Telephone: [phone]  Fax: [phone]    Date of encounter: 11/11/20 PATIENT NAME: Diana Mccoy [address] [place] [address]   [phone] (home) [phone] (work) DOB: [birth year] MRN: [id number] PRIMARY CARE PROVIDER:    Venia Carbon, MD,  [place] [address] [phone]  REFERRING PROVIDER:   Venia Carbon, MD [address] [place],  [place] [address] [phone]  RESPONSIBLE PARTY:    Contact Information    Name Relation Home Work Mobile   [name] Daughter [phone]         I met face to face with patient in facility. Palliative Care was asked to follow this patient by consultation request of  Venia Carbon, MD to address advance care planning and complex medical decision making. This is a follow up visit.  Called daughter to update on visit.  Left VM with reason for call and call back info                                   ASSESSMENT AND PLAN / RECOMMENDATIONS:   Advance Care Planning/Goals of Care: Goals include to maximize quality of life and symptom management.   CODE STATUS: DNR  Symptom Management/Plan:  Deficits s/p CVA/TIAs/expressive aphasia: This is unchanged.  Continues to require total care and lift for transfers.  Patient is able to understand questions in conversation.  Can make her needs known.  Continue supportive care at facility  Left foot ulcer: This is chronic and being cared for by nurses at facility.  Continue current treatment  Follow up Palliative Care Visit: Palliative care will continue to follow for complex medical decision making, advance care planning, and clarification of goals. Return 8-10 weeks or prn.  I spent 30 minutes providing this consultation. More than 50% of the time in this consultation was spent in counseling and care coordination.  PPS: 30%  HOSPICE ELIGIBILITY/DIAGNOSIS:  TBD  Chief Complaint: follow up palliative visit  HISTORY OF PRESENT ILLNESS:  Diana Mccoy is a 75 y.o. year old female  with affective bipolar disorder, HTN, anxiety, COPD, OA, presbyesophagus, scoliosis, spinal stenosis, left hemiplegia and expressive aphasia s/p CVA.  Patient does indicate having pain in the left foot that has the chronic ulcer.  Patient is on fentanyl patch 12.5 mcg/h to be changed every 3 days and has Roxanol and Tylenol as needed.  Staff does report that she complains of pain regardless how much pain medication they give her.  Staff reports that some days she eats better than other days with no reported weight loss.  Patient unable to contribute much to HPI HPI/ROS related to expressive aphasia.  Staff had no other concerns at this time.  Patient has not had any falls, infection, hospital visits since last visit.  History obtained from review of EMR and interview with facility staff and [name].   PHYSICAL EXAM:   General: NAD, frail appearing Eyes: sclera anicteric and noninjected with no discharge noted Cardiovascular: regular rate and rhythm Pulmonary: clear ant fields; normal respiratory effort Abdomen: soft, nontender, + bowel sounds Extremities: patient has left vascular foot ulcer that is being managed by nurses at facility (did not take bandage off for exam today); contracture of right hand Skin: no rashes on exposed skin Neurological: Weakness; patient has expressive aphasia, unable to assess orientation  Thank you for the opportunity to participate in the care of [name].  The palliative care team will continue to follow. Please call our office at [phone] if we can be of additional assistance.   Rafe Mackowski Jenetta Downer, NP , DNP  This chart was dictated using voice recognition software. Despite best efforts to proofread, errors can occur which can change the documentation meaning.   COVID-19 PATIENT SCREENING TOOL Asked and negative response unless otherwise  noted:   Have you had symptoms of covid, tested positive or been in contact with someone with symptoms/positive test in the past 5-10 days? negative

## 2020-11-18 DIAGNOSIS — Z23 Encounter for immunization: Secondary | ICD-10-CM | POA: Diagnosis not present

## 2020-11-22 ENCOUNTER — Ambulatory Visit: Admitting: Internal Medicine

## 2021-01-13 DIAGNOSIS — B351 Tinea unguium: Secondary | ICD-10-CM | POA: Diagnosis not present

## 2021-01-13 DIAGNOSIS — F39 Unspecified mood [affective] disorder: Secondary | ICD-10-CM | POA: Diagnosis not present

## 2021-01-13 DIAGNOSIS — I69354 Hemiplegia and hemiparesis following cerebral infarction affecting left non-dominant side: Secondary | ICD-10-CM | POA: Diagnosis not present

## 2021-01-13 DIAGNOSIS — F1129 Opioid dependence with unspecified opioid-induced disorder: Secondary | ICD-10-CM

## 2021-01-13 DIAGNOSIS — L97529 Non-pressure chronic ulcer of other part of left foot with unspecified severity: Secondary | ICD-10-CM

## 2021-01-13 DIAGNOSIS — J069 Acute upper respiratory infection, unspecified: Secondary | ICD-10-CM | POA: Diagnosis not present

## 2021-01-13 DIAGNOSIS — F200 Paranoid schizophrenia: Secondary | ICD-10-CM

## 2021-02-10 ENCOUNTER — Non-Acute Institutional Stay: Payer: Medicare Other | Admitting: Student

## 2021-02-10 ENCOUNTER — Other Ambulatory Visit: Payer: Self-pay

## 2021-02-10 DIAGNOSIS — I69354 Hemiplegia and hemiparesis following cerebral infarction affecting left non-dominant side: Secondary | ICD-10-CM

## 2021-02-10 DIAGNOSIS — Z515 Encounter for palliative care: Secondary | ICD-10-CM | POA: Diagnosis not present

## 2021-02-10 DIAGNOSIS — R52 Pain, unspecified: Secondary | ICD-10-CM | POA: Diagnosis not present

## 2021-02-10 NOTE — Progress Notes (Signed)
Designer, jewellery Palliative Care Consult Note Telephone: [phone]  Fax: [phone]    Date of encounter: 02/10/21 PATIENT NAME: Diana Mccoy [address] [place] [address]   [phone] (home) [phone] (work) DOB: 22-Feb-1946 MRN: [id number] PRIMARY CARE PROVIDER:    Venia Carbon, MD,  [place] [address] [phone]  REFERRING PROVIDER:   Venia Carbon, MD [address] [place],  [place] [address] [phone]  RESPONSIBLE PARTY:    Contact Information     Name Relation Home Work Mobile   [name] Daughter [phone]          I met face to face with patient in the facility. Palliative Care was asked to follow this patient by consultation request of  Venia Carbon, MD to address advance care planning and complex medical decision making. This is a follow up visit. Will call daughter to provide an update on visit.                                    ASSESSMENT AND PLAN / RECOMMENDATIONS:   Advance Care Planning/Goals of Care: Goals include to maximize quality of life and symptom management.   CODE STATUS: DNR  Symptom Management/Plan:  Late effect CVA, left sided hemiplegia, expressive aphasia-patient has been stable. She is dependent for all adl's; staff will continue to assist with daily care needs. Continue pureed diet, thickened liquids. Monitor for aspiration, monitor for functional and cognitive declines.   Pain-patient with generalized pain secondary to OA, spinal stenosis. Continue current regimen of duragesic patches to =37 mcg, changed every 3 days; acetaminophen, ultram and morphine as needed. Monitor for worsening pain/discomfort.    Follow up Palliative Care Visit: Palliative care will continue to follow for complex medical decision making, advance care planning, and clarification of goals. Return in 8 weeks or prn.  I spent 25 minutes providing this  consultation. More than 50% of the time in this consultation was spent in counseling and care coordination.   PPS: 30%  HOSPICE ELIGIBILITY/DIAGNOSIS: TBD  Chief Complaint: Palliative Medicine follow up visit.   HISTORY OF PRESENT ILLNESS:  Diana Mccoy is a 75 y.o. year old female  with Late effect CVA, left sided hemiplegia, expressive aphasia, presbyesophagus, bipolar disorder, schizophrenia, COPD, OA, spinal stenosis, scoliosis, anxiety, hypertension.  Patient resides at [facility]. Staff report patient being stable, no changes or declines. Patient will shake head yes/no to questions; nurse reports she will occasionally speak out a few words. She is dependent for all adl's except she is able to feed herself. She is receiving a pureed diet with thickened liquids. Her appetite is fair overall; some days she eats better than others. Her weight has been stable. She does have complaints of generalized pain. She is currently receiving duragesic patches 36mg and 165m= 3748mpatches changed every 3 days. She is also has orders for PRN acetaminophen, ultram and morphine. Patient has air mattress to bed. She is currently receiving treatment to left dorsal surface; no other wounds reported. No recent infections, falls, injury, ER visits reported. ROS and HPI mostly obtained per staff. Patient unable to substantially contribute due to her aphasia.   History obtained from review of EMR, discussion with primary team, and interview with family, facility staff/caregiver and/or Ms. OweRoxy MannsI reviewed available labs, medications, imaging, studies and related documents from  the EMR.  Records reviewed and summarized above.   ROS  Unable to contribute due to her expressive aphasia.   Physical Exam: Weight: 139.5 pounds Constitutional: NAD General: frail appearing EYES: anicteric sclera, lids intact, no discharge  ENMT: intact hearing, oral mucous membranes moist CV: S1S2, RRR, no LE edema Pulmonary:  LCTA, no increased work of breathing, no cough, room air Abdomen: normo-active BS + 4 quadrants, soft and non tender GU: deferred MSK: moves all extremities, non ambulatory, left hand contracture, left sided hemiplegia Skin: warm and dry, no rashes; dressing CDI to left dorsal foot Neuro: generalized weakness, Alert and oriented to person, does not verbalize; shakes head yes/no to questions Psych: non-anxious affect Hem/lymph/immuno: no widespread bruising   Thank you for the opportunity to participate in the care of Diana Mccoy.  The palliative care team will continue to follow. Please call our office at [phone] if we can be of additional assistance.   Ezekiel Slocumb, NP   COVID-19 PATIENT SCREENING TOOL Asked and negative response unless otherwise noted:   Have you had symptoms of covid, tested positive or been in contact with someone with symptoms/positive test in the past 5-10 days? No

## 2021-03-27 DIAGNOSIS — L97529 Non-pressure chronic ulcer of other part of left foot with unspecified severity: Secondary | ICD-10-CM | POA: Diagnosis not present

## 2021-03-27 DIAGNOSIS — F200 Paranoid schizophrenia: Secondary | ICD-10-CM | POA: Diagnosis not present

## 2021-03-27 DIAGNOSIS — J449 Chronic obstructive pulmonary disease, unspecified: Secondary | ICD-10-CM | POA: Diagnosis not present

## 2021-03-27 DIAGNOSIS — G8194 Hemiplegia, unspecified affecting left nondominant side: Secondary | ICD-10-CM | POA: Diagnosis not present

## 2021-03-27 DIAGNOSIS — F39 Unspecified mood [affective] disorder: Secondary | ICD-10-CM | POA: Diagnosis not present

## 2021-05-17 DIAGNOSIS — J449 Chronic obstructive pulmonary disease, unspecified: Secondary | ICD-10-CM

## 2021-05-17 DIAGNOSIS — F1120 Opioid dependence, uncomplicated: Secondary | ICD-10-CM

## 2021-05-17 DIAGNOSIS — F200 Paranoid schizophrenia: Secondary | ICD-10-CM

## 2021-05-17 DIAGNOSIS — L97529 Non-pressure chronic ulcer of other part of left foot with unspecified severity: Secondary | ICD-10-CM

## 2021-05-17 DIAGNOSIS — I69354 Hemiplegia and hemiparesis following cerebral infarction affecting left non-dominant side: Secondary | ICD-10-CM

## 2021-05-17 DIAGNOSIS — M48061 Spinal stenosis, lumbar region without neurogenic claudication: Secondary | ICD-10-CM

## 2021-05-17 DIAGNOSIS — G40919 Epilepsy, unspecified, intractable, without status epilepticus: Secondary | ICD-10-CM

## 2021-05-17 DIAGNOSIS — F39 Unspecified mood [affective] disorder: Secondary | ICD-10-CM

## 2021-07-12 ENCOUNTER — Other Ambulatory Visit: Payer: Self-pay

## 2021-07-12 ENCOUNTER — Non-Acute Institutional Stay: Payer: Medicare Other | Admitting: Student

## 2021-07-12 DIAGNOSIS — I69354 Hemiplegia and hemiparesis following cerebral infarction affecting left non-dominant side: Secondary | ICD-10-CM | POA: Diagnosis not present

## 2021-07-12 DIAGNOSIS — Z515 Encounter for palliative care: Secondary | ICD-10-CM | POA: Diagnosis not present

## 2021-07-12 DIAGNOSIS — R52 Pain, unspecified: Secondary | ICD-10-CM

## 2021-07-12 NOTE — Progress Notes (Signed)
Designer, jewellery Palliative Care Consult Note Telephone: [phone]  Fax: [phone]    Date of encounter: 07/12/21  PATIENT NAME: Diana Mccoy [address] [place] [address]   [phone] (home) [phone] (work) DOB: 1945/11/28 MRN: [id number] PRIMARY CARE PROVIDER:    Venia Carbon, MD,  [place] [address] [phone]  REFERRING PROVIDER:   Venia Carbon, MD [address] [place],  [place] [address] [phone]  RESPONSIBLE PARTY:    Contact Information     Name Relation Home Work Mobile   [name] Daughter [phone]          I met face to face with patient and family in the facility. Palliative Care was asked to follow this patient by consultation request of  Venia Carbon, MD to address advance care planning and complex medical decision making. This is a follow up visit.                                   ASSESSMENT AND PLAN / RECOMMENDATIONS:   Advance Care Planning/Goals of Care: Goals include to maximize quality of life and symptom management.   CODE STATUS: DNR  Symptom Management/Plan:  Late effect CVA, left sided hemiplegia, expressive aphasia- no recent functional or cognitive declines. She is dependent for all adl's; staff will continue to assist with daily care needs, anticipate her care needs. Continue pureed diet. Monitor for aspiration, monitor for functional and cognitive declines.  Pain-patient with generalized pain secondary to OA, spinal stenosis. Continue current regimen of duragesic patches every 3 days; acetaminophen, ultram and morphine as needed. Monitor for worsening pain/discomfort.   Follow up Palliative Care Visit: Palliative care will continue to follow for complex medical decision making, advance care planning, and clarification of goals. Return in 8-12 weeks or prn.   This visit was coded based on medical decision making (MDM).  PPS:  30%  HOSPICE ELIGIBILITY/DIAGNOSIS: TBD  Chief Complaint: Palliative Medicine follow up visit.   HISTORY OF PRESENT ILLNESS:  Diana Mccoy is a 76 y.o. year old female  with Late effect CVA, left sided hemiplegia, expressive aphasia, presbyesophagus, bipolar disorder, schizophrenia, COPD, OA, spinal stenosis, scoliosis, anxiety, hypertension.    Patient resides at [facility]. Facility staff report patient being stable, no recent changes or declines. Patient received resting in bed, watching television. She does shake her head yes/no to questions to some questions. Nursing staff states she will occasionally speak a few words. She is dependent for all adl's except she is able to feed herself. She is receiving a pureed diet. She has a recurrent wound to the dorsal surface of left foot; wound care per staff. She has an air mattress on her bed. She continues to report generalized pain. No recent infections, ED visits or hospitalizations. ROS and HPI obtained per staff. Patient unable to substantially contribute due to her aphasia.  History obtained from review of EMR, discussion with primary team, and interview with family, facility staff/caregiver and/or Diana Mccoy.  I reviewed available labs, medications, imaging, studies and related documents from the EMR.  Records reviewed and summarized above.   ROS  Patient unable to substantially contribute due to her aphasia.  Physical Exam: Pulse 72, resp 16 Constitutional: NAD General: frail appearing EYES: anicteric sclera, lids intact, no discharge  ENMT: intact hearing, oral mucous membranes moist, dentition intact CV:  S1S2, RRR, no LE edema Pulmonary: LCTA, no increased work of breathing, no cough, room air Abdomen: normo-active BS + 4 quadrants, soft and non tender, no ascites GU: deferred MSK: non-ambulatory, left sided hemiplegia Skin: warm and dry, no rashes; dressing CDI to left foot Neuro: generalized weakness, A & O to person Psych:  non-anxious affect Hem/lymph/immuno: no widespread bruising   Thank you for the opportunity to participate in the care of Diana Mccoy.  The palliative care team will continue to follow. Please call our office at [phone] if we can be of additional assistance.   Ezekiel Slocumb, NP   COVID-19 PATIENT SCREENING TOOL Asked and negative response unless otherwise noted:   Have you had symptoms of covid, tested positive or been in contact with someone with symptoms/positive test in the past 5-10 days? No

## 2021-07-26 NOTE — Telephone Encounter (Signed)
error 

## 2021-07-27 DIAGNOSIS — L97529 Non-pressure chronic ulcer of other part of left foot with unspecified severity: Secondary | ICD-10-CM | POA: Diagnosis not present

## 2021-07-27 DIAGNOSIS — M48061 Spinal stenosis, lumbar region without neurogenic claudication: Secondary | ICD-10-CM

## 2021-07-27 DIAGNOSIS — F39 Unspecified mood [affective] disorder: Secondary | ICD-10-CM | POA: Diagnosis not present

## 2021-07-27 DIAGNOSIS — J449 Chronic obstructive pulmonary disease, unspecified: Secondary | ICD-10-CM | POA: Diagnosis not present

## 2021-07-27 DIAGNOSIS — F1120 Opioid dependence, uncomplicated: Secondary | ICD-10-CM

## 2021-07-27 DIAGNOSIS — G8191 Hemiplegia, unspecified affecting right dominant side: Secondary | ICD-10-CM | POA: Diagnosis not present

## 2021-08-21 DIAGNOSIS — L03116 Cellulitis of left lower limb: Secondary | ICD-10-CM | POA: Diagnosis not present

## 2021-09-13 DIAGNOSIS — L97529 Non-pressure chronic ulcer of other part of left foot with unspecified severity: Secondary | ICD-10-CM

## 2021-09-13 DIAGNOSIS — M48061 Spinal stenosis, lumbar region without neurogenic claudication: Secondary | ICD-10-CM

## 2021-09-13 DIAGNOSIS — F39 Unspecified mood [affective] disorder: Secondary | ICD-10-CM | POA: Diagnosis not present

## 2021-09-13 DIAGNOSIS — J449 Chronic obstructive pulmonary disease, unspecified: Secondary | ICD-10-CM

## 2021-09-13 DIAGNOSIS — F11288 Opioid dependence with other opioid-induced disorder: Secondary | ICD-10-CM

## 2021-09-13 DIAGNOSIS — F200 Paranoid schizophrenia: Secondary | ICD-10-CM | POA: Diagnosis not present

## 2021-09-13 DIAGNOSIS — I69354 Hemiplegia and hemiparesis following cerebral infarction affecting left non-dominant side: Secondary | ICD-10-CM | POA: Diagnosis not present

## 2021-09-13 DIAGNOSIS — G40909 Epilepsy, unspecified, not intractable, without status epilepticus: Secondary | ICD-10-CM | POA: Diagnosis not present

## 2021-10-05 DIAGNOSIS — L03116 Cellulitis of left lower limb: Secondary | ICD-10-CM | POA: Diagnosis not present

## 2021-10-06 ENCOUNTER — Non-Acute Institutional Stay: Payer: Medicare Other | Admitting: Student

## 2021-10-06 DIAGNOSIS — I69354 Hemiplegia and hemiparesis following cerebral infarction affecting left non-dominant side: Secondary | ICD-10-CM

## 2021-10-06 DIAGNOSIS — R4701 Aphasia: Secondary | ICD-10-CM | POA: Diagnosis not present

## 2021-10-06 DIAGNOSIS — Z515 Encounter for palliative care: Secondary | ICD-10-CM | POA: Diagnosis not present

## 2021-10-06 DIAGNOSIS — R52 Pain, unspecified: Secondary | ICD-10-CM | POA: Diagnosis not present

## 2021-10-06 NOTE — Progress Notes (Signed)
? ? ?Manufacturing engineer ?Community Palliative Care Consult Note ?Telephone: [phone]  ?Fax: [phone]  ? ? ?Date of encounter: 10/06/21 ?1:57 PM ?PATIENT NAME: Diana Mccoy ?[address] ?[place] [address]   ?[phone] (home) [phone] (work) ?DOB: 01/17/1946 ?MRN: [id number] ?PRIMARY CARE PROVIDER:    ?Venia Carbon, MD,  ?[address] [place] [address] ?[phone] ? ?REFERRING PROVIDER:   ?Venia Carbon, MD ?[address] ?[place],  [place] [address] ?[phone] ? ?RESPONSIBLE PARTY:    ?Contact Information   ? ? Name Relation Home Work Mobile  ? [name] Daughter [phone]    ? ?  ? ? ? ?I met face to face with patient in the facility. Palliative Care was asked to follow this patient by consultation request of  Venia Carbon, MD to address advance care planning and complex medical decision making. This is a follow up visit. ? ?                                 ASSESSMENT AND PLAN / RECOMMENDATIONS:  ? ?Advance Care Planning/Goals of Care: Goals include to maximize quality of life and symptom management. Patient/health care surrogate gave his/her permission to discuss. ?CODE STATUS: DNR ? ?Symptom Management/Plan: ? ?Late effect CVA, left sided hemiplegia, expressive aphasia- patient has been stable. She is dependent for all adl's; staff will continue to assist with daily care needs, anticipate her care needs. Continue pureed diet. Monitor for aspiration, monitor for functional and cognitive declines, skin breakdown. ? ?Pain-patient with generalized pain secondary to OA, spinal stenosis. Continue current regimen of duragesic patches every 3 days; acetaminophen, ultram and morphine as needed. Monitor for worsening pain/discomfort. ? ?Follow up Palliative Care Visit: Palliative care will continue to follow for complex medical decision making, advance care planning, and clarification of goals. Return n 8 weeks or prn. ? ? ?This visit was coded  based on medical decision making (MDM). ? ?PPS: 30% ? ?HOSPICE ELIGIBILITY/DIAGNOSIS: TBD ? ?Chief Complaint: Palliative Medicine follow up visit.  ? ?HISTORY OF PRESENT ILLNESS:  Diana Mccoy is a 76 y.o. year old female  with  Late effect CVA, left sided hemiplegia, expressive aphasia, presbyesophagus, bipolar disorder, schizophrenia, COPD, OA, spinal stenosis, scoliosis, anxiety, hypertension.  ? ?Patient resides at [facility]. She is currently being treated for infection to left foot. Patient with recurrent wound to left foot; area currently left open to air. Air mattress to bed. She shakes her head yes and no to questions; speaks few words per staff occasionally. She endorses generalized pain. She is dependent for all adl's. Appetite has been good; receives pureed diet. No recent falls, injury or ER visits reported. ROS and HPI mostly obtained per staff. Patient unable to substantially contribute due to her aphasia. ?  ?History obtained from review of EMR, discussion with primary team, and interview with family, facility staff/caregiver and/or Diana Mccoy.  ?I reviewed available labs, medications, imaging, studies and related documents from the EMR.  Records reviewed and summarized above.  ? ?ROS ? ?Unable to contribute due to her expressive aphasia. ? ?Physical Exam: ?Weight: 137.2 pounds ?Constitutional: NAD ?General: frail appearing ?EYES: anicteric sclera, lids intact, no discharge  ?ENMT: intact hearing, oral mucous membranes moist ?CV: S1S2, RRR, no LE edema ?Pulmonary: LCTA, no increased work of breathing, no cough, room air ?Abdomen: normo-active BS + 4 quadrants, soft and non tender,  no ascites ?GU: deferred ?MSK: non ambulatory, left hand contracture, left sided hemiplegia ?Skin: warm and dry, no rashes, wound to dorsal surface of left foot, scabbed over with erythema to peri area. Area left open to air ?Neuro:  +generalized weakness, A & O to person ?Psych: non-anxious affect ?Hem/lymph/immuno: no  widespread bruising ? ? ?Thank you for the opportunity to participate in the care of Diana Mccoy.  The palliative care team will continue to follow. Please call our office at [phone] if we can be of additional assistance.  ? ?Ezekiel Slocumb, NP  ? ?COVID-19 PATIENT SCREENING TOOL ?Asked and negative response unless otherwise noted:  ? ?Have you had symptoms of covid, tested positive or been in contact with someone with symptoms/positive test in the past 5-10 days? No ? ?

## 2021-11-06 DIAGNOSIS — F445 Conversion disorder with seizures or convulsions: Secondary | ICD-10-CM | POA: Diagnosis not present

## 2021-11-06 DIAGNOSIS — F1120 Opioid dependence, uncomplicated: Secondary | ICD-10-CM

## 2021-11-06 DIAGNOSIS — F200 Paranoid schizophrenia: Secondary | ICD-10-CM | POA: Diagnosis not present

## 2021-11-06 DIAGNOSIS — G8194 Hemiplegia, unspecified affecting left nondominant side: Secondary | ICD-10-CM | POA: Diagnosis not present

## 2021-11-06 DIAGNOSIS — F39 Unspecified mood [affective] disorder: Secondary | ICD-10-CM | POA: Diagnosis not present

## 2021-11-14 DIAGNOSIS — L03116 Cellulitis of left lower limb: Secondary | ICD-10-CM | POA: Diagnosis not present

## 2021-11-28 DIAGNOSIS — I69352 Hemiplegia and hemiparesis following cerebral infarction affecting left dominant side: Secondary | ICD-10-CM | POA: Diagnosis not present

## 2021-11-29 ENCOUNTER — Non-Acute Institutional Stay: Payer: Medicare Other | Admitting: Student

## 2021-11-29 DIAGNOSIS — R52 Pain, unspecified: Secondary | ICD-10-CM

## 2021-11-29 DIAGNOSIS — I69354 Hemiplegia and hemiparesis following cerebral infarction affecting left non-dominant side: Secondary | ICD-10-CM | POA: Diagnosis not present

## 2021-11-29 DIAGNOSIS — S91302D Unspecified open wound, left foot, subsequent encounter: Secondary | ICD-10-CM | POA: Diagnosis not present

## 2021-11-29 DIAGNOSIS — Z515 Encounter for palliative care: Secondary | ICD-10-CM

## 2021-11-29 NOTE — Progress Notes (Signed)
[facility] Consult Note Telephone: [phone]  Fax: [phone]    Date of encounter: 11/29/21 4:15 PM PATIENT NAME: Diana Mccoy [place] [address]   [phone] (home) [phone] (work) DOB: Apr 29, 1946 MRN: [id number] PRIMARY CARE PROVIDER:    Venia Carbon, MD,  [place] [address] [phone]  REFERRING PROVIDER:   Venia Carbon, MD [address] [place],  [place] [address] [phone]  RESPONSIBLE PARTY:    Contact Information     Name Relation Home Work Mobile   [name] Daughter [phone]          I met face to face with patient in the facility. Palliative Care was asked to follow this patient by consultation request of  Venia Carbon, MD to address advance care planning and complex medical decision making. This is a follow up visit.                                   ASSESSMENT AND PLAN / RECOMMENDATIONS:   Advance Care Planning/Goals of Care: Goals include to maximize quality of life and symptom management. Patient/health care surrogate gave his/her permission to discuss. CODE STATUS: DNR  Symptom Management/Plan:  Late effect CVA, left sided hemiplegia, expressive aphasia- patient has been stable. She is dependent for all adl's; staff to continue to assist with daily care needs, anticipate her care needs. Patient occasionally speaks few words. Continue pureed diet due to her dysphagia. Monitor for aspiration, monitor for functional and cognitive declines, skin breakdown.   Pain-patient with generalized pain secondary to OA, spinal stenosis. Continue current regimen of duragesic patches every 3 days; acetaminophen, ultram and morphine as needed. Monitor for worsening pain/discomfort.  Wound to dorsal surface of left foot-patient recently completed 14 days of antibiotic therapy. Area is currently left open to air. Routine care per  facility directions.   Follow up Palliative Care Visit: Palliative care will continue to follow for complex medical decision making, advance care planning, and clarification of goals. Return in 8-12 weeks or prn.   This visit was coded based on medical decision making (MDM).  PPS: 30%  HOSPICE ELIGIBILITY/DIAGNOSIS: TBD  Chief Complaint: Palliative Medicine follow up visit.   HISTORY OF PRESENT ILLNESS:  Anaira M Wingate is a 76 y.o. year old female  with Late effect CVA, left sided hemiplegia, expressive aphasia, presbyesophagus, bipolar disorder, schizophrenia, COPD, OA, spinal stenosis, scoliosis, anxiety, hypertension.  Patient resides at [facility]. Patient with recurrent wound to dorsal surface of left foot. She recently completed 14 days of antibiotic therapy; area is left open to air. Patient has air mattress to bed. She is dependent for all adl's. She continues to have a good appetite; pureed diet with thickened liquids. Patient received resting in bed; she has eaten 75% of her lunch meal. She denies pain today. She shakes her head yes and no to questions. Wound to left foot, dorsal surface is irregular shaped, scabbed over, mild erythema noted. Patient with bilateral foot drop, left > right. ROS and HPI mostly obtained per staff. Patient unable to substantially contribute due to her aphasia.  History obtained from review of EMR, discussion with primary team, and interview with family, facility staff/caregiver and/or [name].  I reviewed available labs, medications, imaging, studies and related documents from the EMR.  Records reviewed and summarized above.  Physical Exam: Pulse 76, resp 16 Constitutional: NAD General: frail appearing EYES: anicteric sclera, lids intact, no discharge  ENMT: intact hearing, oral mucous membranes moist CV: S1S2, RRR, no LE edema Pulmonary: upper lobes LCTA, no increased work of breathing, no cough, room air Abdomen: normo-active BS + 4 quadrants,  soft and non tender, no ascites GU: deferred MSK: non ambulatory, left hand contracture, left sided hemiplegia Skin: warm and dry, no rashes, wound to dorsal surface of left foot, area left open to air Neuro: generalized weakness, A & O to person Psych: non-anxious affect Hem/lymph/immuno: no widespread bruising   Thank you for the opportunity to participate in the care of [name].  The palliative care team will continue to follow. Please call our office at [phone] if we can be of additional assistance.   Ezekiel Slocumb, NP   COVID-19 PATIENT SCREENING TOOL Asked and negative response unless otherwise noted:   Have you had symptoms of covid, tested positive or been in contact with someone with symptoms/positive test in the past 5-10 days? No

## 2021-11-30 DIAGNOSIS — I69352 Hemiplegia and hemiparesis following cerebral infarction affecting left dominant side: Secondary | ICD-10-CM | POA: Diagnosis not present

## 2021-11-30 DIAGNOSIS — F200 Paranoid schizophrenia: Secondary | ICD-10-CM | POA: Diagnosis not present

## 2021-11-30 DIAGNOSIS — L97429 Non-pressure chronic ulcer of left heel and midfoot with unspecified severity: Secondary | ICD-10-CM | POA: Diagnosis not present

## 2021-11-30 DIAGNOSIS — G40909 Epilepsy, unspecified, not intractable, without status epilepticus: Secondary | ICD-10-CM | POA: Diagnosis not present

## 2021-11-30 DIAGNOSIS — M24572 Contracture, left ankle: Secondary | ICD-10-CM | POA: Diagnosis not present

## 2021-11-30 DIAGNOSIS — Z741 Need for assistance with personal care: Secondary | ICD-10-CM | POA: Diagnosis not present

## 2021-11-30 DIAGNOSIS — M6281 Muscle weakness (generalized): Secondary | ICD-10-CM | POA: Diagnosis not present

## 2021-11-30 DIAGNOSIS — R278 Other lack of coordination: Secondary | ICD-10-CM | POA: Diagnosis not present

## 2021-11-30 DIAGNOSIS — J449 Chronic obstructive pulmonary disease, unspecified: Secondary | ICD-10-CM | POA: Diagnosis not present

## 2021-11-30 DIAGNOSIS — I69921 Dysphasia following unspecified cerebrovascular disease: Secondary | ICD-10-CM | POA: Diagnosis not present

## 2021-11-30 DIAGNOSIS — R4189 Other symptoms and signs involving cognitive functions and awareness: Secondary | ICD-10-CM | POA: Diagnosis not present

## 2021-12-04 DIAGNOSIS — M6281 Muscle weakness (generalized): Secondary | ICD-10-CM | POA: Diagnosis not present

## 2021-12-04 DIAGNOSIS — R4189 Other symptoms and signs involving cognitive functions and awareness: Secondary | ICD-10-CM | POA: Diagnosis not present

## 2021-12-04 DIAGNOSIS — Z741 Need for assistance with personal care: Secondary | ICD-10-CM | POA: Diagnosis not present

## 2021-12-04 DIAGNOSIS — R278 Other lack of coordination: Secondary | ICD-10-CM | POA: Diagnosis not present

## 2021-12-04 DIAGNOSIS — M24572 Contracture, left ankle: Secondary | ICD-10-CM | POA: Diagnosis not present

## 2021-12-04 DIAGNOSIS — I69352 Hemiplegia and hemiparesis following cerebral infarction affecting left dominant side: Secondary | ICD-10-CM | POA: Diagnosis not present

## 2021-12-05 DIAGNOSIS — Z741 Need for assistance with personal care: Secondary | ICD-10-CM | POA: Diagnosis not present

## 2021-12-05 DIAGNOSIS — M24572 Contracture, left ankle: Secondary | ICD-10-CM | POA: Diagnosis not present

## 2021-12-05 DIAGNOSIS — R4189 Other symptoms and signs involving cognitive functions and awareness: Secondary | ICD-10-CM | POA: Diagnosis not present

## 2021-12-05 DIAGNOSIS — R278 Other lack of coordination: Secondary | ICD-10-CM | POA: Diagnosis not present

## 2021-12-05 DIAGNOSIS — M6281 Muscle weakness (generalized): Secondary | ICD-10-CM | POA: Diagnosis not present

## 2021-12-05 DIAGNOSIS — I69352 Hemiplegia and hemiparesis following cerebral infarction affecting left dominant side: Secondary | ICD-10-CM | POA: Diagnosis not present

## 2021-12-07 DIAGNOSIS — M6281 Muscle weakness (generalized): Secondary | ICD-10-CM | POA: Diagnosis not present

## 2021-12-07 DIAGNOSIS — M24572 Contracture, left ankle: Secondary | ICD-10-CM | POA: Diagnosis not present

## 2021-12-07 DIAGNOSIS — Z741 Need for assistance with personal care: Secondary | ICD-10-CM | POA: Diagnosis not present

## 2021-12-07 DIAGNOSIS — I69352 Hemiplegia and hemiparesis following cerebral infarction affecting left dominant side: Secondary | ICD-10-CM | POA: Diagnosis not present

## 2021-12-07 DIAGNOSIS — R278 Other lack of coordination: Secondary | ICD-10-CM | POA: Diagnosis not present

## 2021-12-07 DIAGNOSIS — R4189 Other symptoms and signs involving cognitive functions and awareness: Secondary | ICD-10-CM | POA: Diagnosis not present

## 2021-12-11 DIAGNOSIS — Z741 Need for assistance with personal care: Secondary | ICD-10-CM | POA: Diagnosis not present

## 2021-12-11 DIAGNOSIS — M6281 Muscle weakness (generalized): Secondary | ICD-10-CM | POA: Diagnosis not present

## 2021-12-11 DIAGNOSIS — I69352 Hemiplegia and hemiparesis following cerebral infarction affecting left dominant side: Secondary | ICD-10-CM | POA: Diagnosis not present

## 2021-12-11 DIAGNOSIS — R4189 Other symptoms and signs involving cognitive functions and awareness: Secondary | ICD-10-CM | POA: Diagnosis not present

## 2021-12-11 DIAGNOSIS — R278 Other lack of coordination: Secondary | ICD-10-CM | POA: Diagnosis not present

## 2021-12-11 DIAGNOSIS — M24572 Contracture, left ankle: Secondary | ICD-10-CM | POA: Diagnosis not present

## 2021-12-12 DIAGNOSIS — I69352 Hemiplegia and hemiparesis following cerebral infarction affecting left dominant side: Secondary | ICD-10-CM | POA: Diagnosis not present

## 2021-12-12 DIAGNOSIS — R4189 Other symptoms and signs involving cognitive functions and awareness: Secondary | ICD-10-CM | POA: Diagnosis not present

## 2021-12-12 DIAGNOSIS — M24572 Contracture, left ankle: Secondary | ICD-10-CM | POA: Diagnosis not present

## 2021-12-12 DIAGNOSIS — Z741 Need for assistance with personal care: Secondary | ICD-10-CM | POA: Diagnosis not present

## 2021-12-12 DIAGNOSIS — R278 Other lack of coordination: Secondary | ICD-10-CM | POA: Diagnosis not present

## 2021-12-12 DIAGNOSIS — M6281 Muscle weakness (generalized): Secondary | ICD-10-CM | POA: Diagnosis not present

## 2021-12-14 DIAGNOSIS — I69352 Hemiplegia and hemiparesis following cerebral infarction affecting left dominant side: Secondary | ICD-10-CM | POA: Diagnosis not present

## 2021-12-14 DIAGNOSIS — M24572 Contracture, left ankle: Secondary | ICD-10-CM | POA: Diagnosis not present

## 2021-12-14 DIAGNOSIS — R4189 Other symptoms and signs involving cognitive functions and awareness: Secondary | ICD-10-CM | POA: Diagnosis not present

## 2021-12-14 DIAGNOSIS — R278 Other lack of coordination: Secondary | ICD-10-CM | POA: Diagnosis not present

## 2021-12-14 DIAGNOSIS — M6281 Muscle weakness (generalized): Secondary | ICD-10-CM | POA: Diagnosis not present

## 2021-12-14 DIAGNOSIS — Z741 Need for assistance with personal care: Secondary | ICD-10-CM | POA: Diagnosis not present

## 2021-12-18 DIAGNOSIS — R278 Other lack of coordination: Secondary | ICD-10-CM | POA: Diagnosis not present

## 2021-12-18 DIAGNOSIS — Z741 Need for assistance with personal care: Secondary | ICD-10-CM | POA: Diagnosis not present

## 2021-12-18 DIAGNOSIS — M6281 Muscle weakness (generalized): Secondary | ICD-10-CM | POA: Diagnosis not present

## 2021-12-18 DIAGNOSIS — M24572 Contracture, left ankle: Secondary | ICD-10-CM | POA: Diagnosis not present

## 2021-12-18 DIAGNOSIS — R4189 Other symptoms and signs involving cognitive functions and awareness: Secondary | ICD-10-CM | POA: Diagnosis not present

## 2021-12-18 DIAGNOSIS — I69352 Hemiplegia and hemiparesis following cerebral infarction affecting left dominant side: Secondary | ICD-10-CM | POA: Diagnosis not present

## 2021-12-19 DIAGNOSIS — R4189 Other symptoms and signs involving cognitive functions and awareness: Secondary | ICD-10-CM | POA: Diagnosis not present

## 2021-12-19 DIAGNOSIS — I69352 Hemiplegia and hemiparesis following cerebral infarction affecting left dominant side: Secondary | ICD-10-CM | POA: Diagnosis not present

## 2021-12-19 DIAGNOSIS — Z741 Need for assistance with personal care: Secondary | ICD-10-CM | POA: Diagnosis not present

## 2021-12-19 DIAGNOSIS — R278 Other lack of coordination: Secondary | ICD-10-CM | POA: Diagnosis not present

## 2021-12-19 DIAGNOSIS — M24572 Contracture, left ankle: Secondary | ICD-10-CM | POA: Diagnosis not present

## 2021-12-19 DIAGNOSIS — M6281 Muscle weakness (generalized): Secondary | ICD-10-CM | POA: Diagnosis not present

## 2021-12-21 DIAGNOSIS — M6281 Muscle weakness (generalized): Secondary | ICD-10-CM | POA: Diagnosis not present

## 2021-12-21 DIAGNOSIS — I69352 Hemiplegia and hemiparesis following cerebral infarction affecting left dominant side: Secondary | ICD-10-CM | POA: Diagnosis not present

## 2021-12-21 DIAGNOSIS — M24572 Contracture, left ankle: Secondary | ICD-10-CM | POA: Diagnosis not present

## 2021-12-21 DIAGNOSIS — R4189 Other symptoms and signs involving cognitive functions and awareness: Secondary | ICD-10-CM | POA: Diagnosis not present

## 2021-12-21 DIAGNOSIS — R278 Other lack of coordination: Secondary | ICD-10-CM | POA: Diagnosis not present

## 2021-12-21 DIAGNOSIS — Z741 Need for assistance with personal care: Secondary | ICD-10-CM | POA: Diagnosis not present

## 2021-12-25 DIAGNOSIS — M24572 Contracture, left ankle: Secondary | ICD-10-CM | POA: Diagnosis not present

## 2021-12-25 DIAGNOSIS — Z741 Need for assistance with personal care: Secondary | ICD-10-CM | POA: Diagnosis not present

## 2021-12-25 DIAGNOSIS — R278 Other lack of coordination: Secondary | ICD-10-CM | POA: Diagnosis not present

## 2021-12-25 DIAGNOSIS — M6281 Muscle weakness (generalized): Secondary | ICD-10-CM | POA: Diagnosis not present

## 2021-12-25 DIAGNOSIS — I69352 Hemiplegia and hemiparesis following cerebral infarction affecting left dominant side: Secondary | ICD-10-CM | POA: Diagnosis not present

## 2021-12-25 DIAGNOSIS — R4189 Other symptoms and signs involving cognitive functions and awareness: Secondary | ICD-10-CM | POA: Diagnosis not present

## 2021-12-26 DIAGNOSIS — L97519 Non-pressure chronic ulcer of other part of right foot with unspecified severity: Secondary | ICD-10-CM | POA: Diagnosis not present

## 2021-12-26 DIAGNOSIS — R4189 Other symptoms and signs involving cognitive functions and awareness: Secondary | ICD-10-CM | POA: Diagnosis not present

## 2021-12-26 DIAGNOSIS — Z741 Need for assistance with personal care: Secondary | ICD-10-CM | POA: Diagnosis not present

## 2021-12-26 DIAGNOSIS — I69352 Hemiplegia and hemiparesis following cerebral infarction affecting left dominant side: Secondary | ICD-10-CM | POA: Diagnosis not present

## 2021-12-26 DIAGNOSIS — R278 Other lack of coordination: Secondary | ICD-10-CM | POA: Diagnosis not present

## 2021-12-26 DIAGNOSIS — M6281 Muscle weakness (generalized): Secondary | ICD-10-CM | POA: Diagnosis not present

## 2021-12-26 DIAGNOSIS — M24572 Contracture, left ankle: Secondary | ICD-10-CM | POA: Diagnosis not present

## 2022-01-01 DIAGNOSIS — G40909 Epilepsy, unspecified, not intractable, without status epilepticus: Secondary | ICD-10-CM | POA: Diagnosis not present

## 2022-01-01 DIAGNOSIS — I69352 Hemiplegia and hemiparesis following cerebral infarction affecting left dominant side: Secondary | ICD-10-CM | POA: Diagnosis not present

## 2022-01-01 DIAGNOSIS — I69921 Dysphasia following unspecified cerebrovascular disease: Secondary | ICD-10-CM | POA: Diagnosis not present

## 2022-01-01 DIAGNOSIS — R278 Other lack of coordination: Secondary | ICD-10-CM | POA: Diagnosis not present

## 2022-01-01 DIAGNOSIS — R4189 Other symptoms and signs involving cognitive functions and awareness: Secondary | ICD-10-CM | POA: Diagnosis not present

## 2022-01-01 DIAGNOSIS — M24572 Contracture, left ankle: Secondary | ICD-10-CM | POA: Diagnosis not present

## 2022-01-01 DIAGNOSIS — J449 Chronic obstructive pulmonary disease, unspecified: Secondary | ICD-10-CM | POA: Diagnosis not present

## 2022-01-01 DIAGNOSIS — L97429 Non-pressure chronic ulcer of left heel and midfoot with unspecified severity: Secondary | ICD-10-CM | POA: Diagnosis not present

## 2022-01-01 DIAGNOSIS — Z741 Need for assistance with personal care: Secondary | ICD-10-CM | POA: Diagnosis not present

## 2022-01-01 DIAGNOSIS — F200 Paranoid schizophrenia: Secondary | ICD-10-CM | POA: Diagnosis not present

## 2022-01-01 DIAGNOSIS — M6281 Muscle weakness (generalized): Secondary | ICD-10-CM | POA: Diagnosis not present

## 2022-01-03 DIAGNOSIS — B372 Candidiasis of skin and nail: Secondary | ICD-10-CM

## 2022-01-03 DIAGNOSIS — M48061 Spinal stenosis, lumbar region without neurogenic claudication: Secondary | ICD-10-CM

## 2022-01-03 DIAGNOSIS — J449 Chronic obstructive pulmonary disease, unspecified: Secondary | ICD-10-CM

## 2022-01-03 DIAGNOSIS — F1120 Opioid dependence, uncomplicated: Secondary | ICD-10-CM

## 2022-01-03 DIAGNOSIS — F39 Unspecified mood [affective] disorder: Secondary | ICD-10-CM

## 2022-01-03 DIAGNOSIS — I69354 Hemiplegia and hemiparesis following cerebral infarction affecting left non-dominant side: Secondary | ICD-10-CM

## 2022-01-03 DIAGNOSIS — G40911 Epilepsy, unspecified, intractable, with status epilepticus: Secondary | ICD-10-CM

## 2022-01-03 DIAGNOSIS — L97529 Non-pressure chronic ulcer of other part of left foot with unspecified severity: Secondary | ICD-10-CM

## 2022-01-03 DIAGNOSIS — F200 Paranoid schizophrenia: Secondary | ICD-10-CM

## 2022-01-05 DIAGNOSIS — R278 Other lack of coordination: Secondary | ICD-10-CM | POA: Diagnosis not present

## 2022-01-05 DIAGNOSIS — M6281 Muscle weakness (generalized): Secondary | ICD-10-CM | POA: Diagnosis not present

## 2022-01-05 DIAGNOSIS — Z741 Need for assistance with personal care: Secondary | ICD-10-CM | POA: Diagnosis not present

## 2022-01-05 DIAGNOSIS — R4189 Other symptoms and signs involving cognitive functions and awareness: Secondary | ICD-10-CM | POA: Diagnosis not present

## 2022-01-05 DIAGNOSIS — I69352 Hemiplegia and hemiparesis following cerebral infarction affecting left dominant side: Secondary | ICD-10-CM | POA: Diagnosis not present

## 2022-01-05 DIAGNOSIS — M24572 Contracture, left ankle: Secondary | ICD-10-CM | POA: Diagnosis not present

## 2022-01-15 DIAGNOSIS — R278 Other lack of coordination: Secondary | ICD-10-CM | POA: Diagnosis not present

## 2022-01-15 DIAGNOSIS — R4189 Other symptoms and signs involving cognitive functions and awareness: Secondary | ICD-10-CM | POA: Diagnosis not present

## 2022-01-15 DIAGNOSIS — M6281 Muscle weakness (generalized): Secondary | ICD-10-CM | POA: Diagnosis not present

## 2022-01-15 DIAGNOSIS — Z741 Need for assistance with personal care: Secondary | ICD-10-CM | POA: Diagnosis not present

## 2022-01-15 DIAGNOSIS — M24572 Contracture, left ankle: Secondary | ICD-10-CM | POA: Diagnosis not present

## 2022-01-15 DIAGNOSIS — I69352 Hemiplegia and hemiparesis following cerebral infarction affecting left dominant side: Secondary | ICD-10-CM | POA: Diagnosis not present

## 2022-01-29 DIAGNOSIS — M24572 Contracture, left ankle: Secondary | ICD-10-CM | POA: Diagnosis not present

## 2022-01-29 DIAGNOSIS — R278 Other lack of coordination: Secondary | ICD-10-CM | POA: Diagnosis not present

## 2022-01-29 DIAGNOSIS — R4189 Other symptoms and signs involving cognitive functions and awareness: Secondary | ICD-10-CM | POA: Diagnosis not present

## 2022-01-29 DIAGNOSIS — M6281 Muscle weakness (generalized): Secondary | ICD-10-CM | POA: Diagnosis not present

## 2022-01-29 DIAGNOSIS — I69352 Hemiplegia and hemiparesis following cerebral infarction affecting left dominant side: Secondary | ICD-10-CM | POA: Diagnosis not present

## 2022-01-29 DIAGNOSIS — Z741 Need for assistance with personal care: Secondary | ICD-10-CM | POA: Diagnosis not present

## 2022-01-30 DIAGNOSIS — R4189 Other symptoms and signs involving cognitive functions and awareness: Secondary | ICD-10-CM | POA: Diagnosis not present

## 2022-01-30 DIAGNOSIS — F200 Paranoid schizophrenia: Secondary | ICD-10-CM | POA: Diagnosis not present

## 2022-01-30 DIAGNOSIS — M24572 Contracture, left ankle: Secondary | ICD-10-CM | POA: Diagnosis not present

## 2022-01-30 DIAGNOSIS — I69921 Dysphasia following unspecified cerebrovascular disease: Secondary | ICD-10-CM | POA: Diagnosis not present

## 2022-01-30 DIAGNOSIS — R278 Other lack of coordination: Secondary | ICD-10-CM | POA: Diagnosis not present

## 2022-01-30 DIAGNOSIS — J449 Chronic obstructive pulmonary disease, unspecified: Secondary | ICD-10-CM | POA: Diagnosis not present

## 2022-01-30 DIAGNOSIS — Z741 Need for assistance with personal care: Secondary | ICD-10-CM | POA: Diagnosis not present

## 2022-01-30 DIAGNOSIS — I69352 Hemiplegia and hemiparesis following cerebral infarction affecting left dominant side: Secondary | ICD-10-CM | POA: Diagnosis not present

## 2022-01-30 DIAGNOSIS — L97429 Non-pressure chronic ulcer of left heel and midfoot with unspecified severity: Secondary | ICD-10-CM | POA: Diagnosis not present

## 2022-01-30 DIAGNOSIS — G40909 Epilepsy, unspecified, not intractable, without status epilepticus: Secondary | ICD-10-CM | POA: Diagnosis not present

## 2022-01-30 DIAGNOSIS — M6281 Muscle weakness (generalized): Secondary | ICD-10-CM | POA: Diagnosis not present

## 2022-01-31 DIAGNOSIS — M24572 Contracture, left ankle: Secondary | ICD-10-CM | POA: Diagnosis not present

## 2022-01-31 DIAGNOSIS — M6281 Muscle weakness (generalized): Secondary | ICD-10-CM | POA: Diagnosis not present

## 2022-01-31 DIAGNOSIS — R278 Other lack of coordination: Secondary | ICD-10-CM | POA: Diagnosis not present

## 2022-01-31 DIAGNOSIS — R4189 Other symptoms and signs involving cognitive functions and awareness: Secondary | ICD-10-CM | POA: Diagnosis not present

## 2022-01-31 DIAGNOSIS — I69352 Hemiplegia and hemiparesis following cerebral infarction affecting left dominant side: Secondary | ICD-10-CM | POA: Diagnosis not present

## 2022-01-31 DIAGNOSIS — Z741 Need for assistance with personal care: Secondary | ICD-10-CM | POA: Diagnosis not present

## 2022-02-05 DIAGNOSIS — M24572 Contracture, left ankle: Secondary | ICD-10-CM | POA: Diagnosis not present

## 2022-02-05 DIAGNOSIS — Z741 Need for assistance with personal care: Secondary | ICD-10-CM | POA: Diagnosis not present

## 2022-02-05 DIAGNOSIS — M6281 Muscle weakness (generalized): Secondary | ICD-10-CM | POA: Diagnosis not present

## 2022-02-05 DIAGNOSIS — I69352 Hemiplegia and hemiparesis following cerebral infarction affecting left dominant side: Secondary | ICD-10-CM | POA: Diagnosis not present

## 2022-02-05 DIAGNOSIS — R278 Other lack of coordination: Secondary | ICD-10-CM | POA: Diagnosis not present

## 2022-02-05 DIAGNOSIS — R4189 Other symptoms and signs involving cognitive functions and awareness: Secondary | ICD-10-CM | POA: Diagnosis not present

## 2022-02-07 DIAGNOSIS — R278 Other lack of coordination: Secondary | ICD-10-CM | POA: Diagnosis not present

## 2022-02-07 DIAGNOSIS — R4189 Other symptoms and signs involving cognitive functions and awareness: Secondary | ICD-10-CM | POA: Diagnosis not present

## 2022-02-07 DIAGNOSIS — M24572 Contracture, left ankle: Secondary | ICD-10-CM | POA: Diagnosis not present

## 2022-02-07 DIAGNOSIS — M6281 Muscle weakness (generalized): Secondary | ICD-10-CM | POA: Diagnosis not present

## 2022-02-07 DIAGNOSIS — Z741 Need for assistance with personal care: Secondary | ICD-10-CM | POA: Diagnosis not present

## 2022-02-07 DIAGNOSIS — I69352 Hemiplegia and hemiparesis following cerebral infarction affecting left dominant side: Secondary | ICD-10-CM | POA: Diagnosis not present

## 2022-02-09 DIAGNOSIS — R4189 Other symptoms and signs involving cognitive functions and awareness: Secondary | ICD-10-CM | POA: Diagnosis not present

## 2022-02-09 DIAGNOSIS — M6281 Muscle weakness (generalized): Secondary | ICD-10-CM | POA: Diagnosis not present

## 2022-02-09 DIAGNOSIS — I69352 Hemiplegia and hemiparesis following cerebral infarction affecting left dominant side: Secondary | ICD-10-CM | POA: Diagnosis not present

## 2022-02-09 DIAGNOSIS — Z741 Need for assistance with personal care: Secondary | ICD-10-CM | POA: Diagnosis not present

## 2022-02-09 DIAGNOSIS — M24572 Contracture, left ankle: Secondary | ICD-10-CM | POA: Diagnosis not present

## 2022-02-09 DIAGNOSIS — R278 Other lack of coordination: Secondary | ICD-10-CM | POA: Diagnosis not present

## 2022-03-19 DIAGNOSIS — F39 Unspecified mood [affective] disorder: Secondary | ICD-10-CM | POA: Diagnosis not present

## 2022-03-19 DIAGNOSIS — G8194 Hemiplegia, unspecified affecting left nondominant side: Secondary | ICD-10-CM | POA: Diagnosis not present

## 2022-03-19 DIAGNOSIS — J449 Chronic obstructive pulmonary disease, unspecified: Secondary | ICD-10-CM | POA: Diagnosis not present

## 2022-03-19 DIAGNOSIS — L97529 Non-pressure chronic ulcer of other part of left foot with unspecified severity: Secondary | ICD-10-CM | POA: Diagnosis not present

## 2022-03-19 DIAGNOSIS — F200 Paranoid schizophrenia: Secondary | ICD-10-CM | POA: Diagnosis not present

## 2022-03-28 ENCOUNTER — Other Ambulatory Visit: Payer: Self-pay | Admitting: Student

## 2022-03-28 DIAGNOSIS — Z515 Encounter for palliative care: Secondary | ICD-10-CM

## 2022-03-28 DIAGNOSIS — I69398 Other sequelae of cerebral infarction: Secondary | ICD-10-CM

## 2022-03-28 DIAGNOSIS — R1310 Dysphagia, unspecified: Secondary | ICD-10-CM

## 2022-03-28 DIAGNOSIS — I69852 Hemiplegia and hemiparesis following other cerebrovascular disease affecting left dominant side: Secondary | ICD-10-CM

## 2022-03-28 MED ORDER — FENTANYL 25 MCG/HR TD PT72: 1.0000 | MEDICATED_PATCH | TRANSDERMAL | 0 refills | Status: AC

## 2022-03-28 MED ORDER — FENTANYL 12 MCG/HR TD PT72: 1.0000 | MEDICATED_PATCH | TRANSDERMAL | 0 refills | Status: AC

## 2022-03-28 NOTE — Progress Notes (Signed)
Patient due for refill of pain medication. Will refill Fentanyl 12 mcg/hr patches to be changed q72 hours. She also has fentanyl 25 mcg/hr patches q72 hours will refill both prescriptions per pharmacy request for chronic medication. Problem list and medications updated.   Diana Rand, MD, [facility] [phone]

## 2022-04-10 ENCOUNTER — Non-Acute Institutional Stay (SKILLED_NURSING_FACILITY): Payer: Medicare Other | Admitting: Nurse Practitioner

## 2022-04-10 ENCOUNTER — Encounter: Payer: Self-pay | Admitting: Nurse Practitioner

## 2022-04-10 DIAGNOSIS — F2089 Other schizophrenia: Secondary | ICD-10-CM | POA: Diagnosis not present

## 2022-04-10 DIAGNOSIS — H5713 Ocular pain, bilateral: Secondary | ICD-10-CM

## 2022-04-10 DIAGNOSIS — L97329 Non-pressure chronic ulcer of left ankle with unspecified severity: Secondary | ICD-10-CM

## 2022-04-10 DIAGNOSIS — I69852 Hemiplegia and hemiparesis following other cerebrovascular disease affecting left dominant side: Secondary | ICD-10-CM

## 2022-04-10 DIAGNOSIS — F419 Anxiety disorder, unspecified: Secondary | ICD-10-CM | POA: Diagnosis not present

## 2022-04-10 NOTE — Progress Notes (Signed)
Location:  Other [facility]) Nursing Home Room Number: 303-A Place of Service:  SNF [phone]) Provider:  Janene Harvey. Janyth Contes, NP    Patient Care Team: Earnestine Mealing, MD as PCP - General (Family Medicine) Lamar Blinks, MD as Consulting Physician (Cardiology) [facility]  Extended Emergency Contact Information Primary Emergency Contact: Tobe Sos Address: [address]          [place], [place] [address] Darden [place] of [place] Home Phone: [phone] Relation: Daughter  Code Status:  DNR Goals of care: Advanced Directive information    04/10/2022    2:24 PM  Advanced Directives  Does Patient Have a Medical Advance Directive? Yes  Type of Advance Directive Out of facility DNR (pink MOST or yellow form)  Does patient want to make changes to medical advance directive? No - Patient declined     Chief Complaint  Patient presents with   Medical Management of Chronic Issues    Routine visit and eye concerns. Discuss need for covid boosters, hep c screening, shingrix, pneumonia, td/tdap, and flu vaccines or post pone if patient refuses. Vitals and medications are a reflection of [facility] system, [employer] [facility]      HPI:  Pt is a 76 y.o. female seen today for medical management of chronic diseases.   She was previously on hospice but has been off for 2 years due to being stable. She now is followed by palliative care. Pt with hx of cva with left sided hemiplegia and expressive aphagia.  She has chronic pain managed with fentanyl patch. pain has been stable She has a chronic ankle wound which wound care is following and has been stable.  She stays in bed most of the day, does not like to get up per nursing.   Staff noted patient was complaining of eye discomfort and noted to have a red right eye. There was no drainage  Denies visual changes, or blurred vision. No headaches or dizziness noted.    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   Disorder of kidney 12/28/2014   Gait instability 04/18/2015   Osteoarthritis 03/28/2015   Presbyesophagus 04/16/2016   Per MBS 04/2016   Schizophrenia (HCC) 03/28/2015   Scoliosis 12/28/2014   Spinal stenosis 12/28/2014   Past Surgical History:  Procedure Laterality Date   HIP FRACTURE SURGERY Left     Allergies  Allergen Reactions   Ciprofloxacin Nausea And Vomiting   Penicillins     rash, hives   Sodium Thiosulfate    Sulfa Antibiotics     Outpatient Encounter Medications as of 04/10/2022  Medication Sig   acetaminophen (TYLENOL) 325 MG tablet Take 650 mg by mouth every 4 (four) hours as needed (for discomfort or elevated temp and notify MD if no relief).   aluminum hydroxide (DERMAGRAN) ointment Apply 1 Application topically as directed. Apply to coccyx, buttocks, and peri area topically every shift after each brief change   aspirin EC 81 MG EC tablet Take 1 tablet (81 mg total) by mouth daily.   Cholecalciferol (D3-1000) 25 MCG (1000 UT) tablet Take 1,000 Units by mouth daily.   fentaNYL (DURAGESIC) 12 MCG/HR Place 1 patch onto the skin every  3 (three) days.   fentaNYL (DURAGESIC) 25 MCG/HR Place 1 patch onto the skin every 3 (three) days.   Loratadine (CLARITIN) 10 MG CAPS Take 1 capsule by mouth at bedtime.   Multiple Vitamin (MULTIVITAMIN) tablet Take 1 tablet by mouth at bedtime.   NYAMYC powder Apply 1 Application topically every 12 (twelve) hours as needed (to groin for moisture rash).   sertraline (ZOLOFT) 25 MG tablet Take 25 mg by mouth daily.   Skin Protectants, Misc. (MINERIN CREME EX) Apply 1 Application topically as directed. Bilaterally to legs every day shift for dry  skin   UNABLE TO FIND Med Name: Med Pass 2.0, give 4 oz three times daily between meals for wound healing   vitamin B-12 (CYANOCOBALAMIN) 500 MCG tablet Take 1,000 mcg by mouth daily.   [DISCONTINUED] morphine 20 MG/5ML solution Take 5-10 mg by mouth every hour as needed for pain.   [DISCONTINUED] nystatin ointment (MYCOSTATIN)    No facility-administered encounter medications on file as of 04/10/2022.    Review of Systems  Constitutional:  Negative for activity change and appetite change.  Eyes:  Positive for pain. Negative for photophobia, discharge, redness, itching and visual disturbance.  Respiratory:  Negative for shortness of breath.   Cardiovascular:  Negative for chest pain.  Genitourinary: Negative.   Neurological:  Negative for dizziness and headaches.  Psychiatric/Behavioral:  Negative for agitation and hallucinations.     Immunization History  Administered Date(s) Administered   Influenza Split 04/18/2011   Influenza,inj,Quad PF,6+ Mos 07/22/2015   Pneumococcal Conjugate-13 07/22/2015   Tdap 04/18/2011   Pertinent  Health Maintenance Due  Topic Date Due   INFLUENZA VACCINE  01/30/2022   DEXA SCAN  Completed   COLONOSCOPY (Pts 45-59yrs Insurance coverage will need to be confirmed)  Discontinued      10/23/2016    3:11 PM 01/29/2019   12:23 PM  [place] in the past year? Yes   Number of falls in past year - Comments  Emmi Telephone Survey Actual Response =   Patient at Risk for Falls Due to Impaired mobility   Fall risk Follow up Falls prevention discussed    Functional Status Survey:    Vitals:   04/10/22 1405  BP: (!) 160/74  Pulse: 86  Resp: 20  Temp: 97.9 F (36.6 C)  SpO2: 96%  Weight: 145 lb (65.8 kg)  Height: 5\' 5"  (1.651 m)   Body mass index is 24.13 kg/m. Physical Exam Constitutional:      General: She is not in acute distress.    Appearance: She is well-developed. She is not diaphoretic.  HENT:     Head: Normocephalic and  atraumatic.     Mouth/Throat:     Pharynx: No oropharyngeal exudate.  Eyes:     General: Lids are normal.     Conjunctiva/sclera: Conjunctivae normal.     Right eye: Right conjunctiva is not injected.     Left eye: Left conjunctiva is not injected.     Pupils: Pupils are equal, round, and reactive to light.     Comments: No redness or drainage noted to bilateral eyes   Cardiovascular:     Rate and Rhythm: Normal rate and regular rhythm.     Heart sounds: Normal heart sounds.  Pulmonary:     Effort: Pulmonary effort is normal.     Breath sounds: Normal breath sounds.  Abdominal:     General: Bowel sounds are normal.     Palpations: Abdomen is  soft.  Musculoskeletal:     Cervical back: Normal range of motion and neck supple.     Right lower leg: No edema.     Left lower leg: No edema.  Skin:    General: Skin is warm and dry.  Neurological:     Mental Status: She is alert. Mental status is at baseline.  Psychiatric:        Mood and Affect: Mood normal.     Labs reviewed: No results for input(s): "NA", "K", "CL", "CO2", "GLUCOSE", "BUN", "CREATININE", "CALCIUM", "MG", "PHOS" in the last 8760 hours. No results for input(s): "AST", "ALT", "ALKPHOS", "BILITOT", "PROT", "ALBUMIN" in the last 8760 hours. No results for input(s): "WBC", "NEUTROABS", "HGB", "HCT", "MCV", "PLT" in the last 8760 hours. Lab Results  Component Value Date   TSH 1.191 03/06/2017   Lab Results  Component Value Date   HGBA1C 5.7 (H) 03/06/2017   No results found for: "CHOL", "HDL", "LDLCALC", "LDLDIRECT", "TRIG", "CHOLHDL"  Significant Diagnostic Results in last 30 days:  No results found.  Assessment/Plan 1. Hemiplegia of left dominant side as late effect of other cerebrovascular disease, unspecified hemiplegia type (HCC) -stable, no acute changes, continues on ASA  2. Chronic ulcer of left ankle, unspecified ulcer stage (HCC) Stable, cont wound care and monitoring with pressure reduction  3.  Other schizophrenia (HCC) -stable, no currently on any medication for this.   4. Eye discomfort, bilateral -there is no signs of trama or foreign body, no redness, drainage at this time. Limited hx due to patients cognitive status. Will have staff use artifical tears q 1 hour PRN.  5. Anxiety Controlled on zoloft 25 mg daily    Zyanya Glaza K. Biagio Borg [facility] [phone]

## 2022-04-20 ENCOUNTER — Non-Acute Institutional Stay: Payer: Medicare Other | Admitting: Student

## 2022-04-20 DIAGNOSIS — Z515 Encounter for palliative care: Secondary | ICD-10-CM | POA: Diagnosis not present

## 2022-04-20 DIAGNOSIS — R4701 Aphasia: Secondary | ICD-10-CM | POA: Diagnosis not present

## 2022-04-20 DIAGNOSIS — R52 Pain, unspecified: Secondary | ICD-10-CM | POA: Diagnosis not present

## 2022-04-20 DIAGNOSIS — I69354 Hemiplegia and hemiparesis following cerebral infarction affecting left non-dominant side: Secondary | ICD-10-CM | POA: Diagnosis not present

## 2022-04-21 NOTE — Progress Notes (Signed)
[facility] Consult Note Telephone: [phone]  Fax: [phone]    Date of encounter: 04/20/22 3:16 PM PATIENT NAME: Diana Mccoy [place] [id number]   [phone] (home) [phone] (work) DOB: 1946-03-08 MRN: [id number] PRIMARY CARE PROVIDER:    Dewayne Shorter, MD,  [place] [address] [phone]  REFERRING PROVIDER:   Dewayne Mccoy, [place],  [place] [address] [phone]  RESPONSIBLE PARTY:    Contact Information     Name Relation Home Work Mobile   Diana Mccoy Daughter [phone]          I met face to face with patient in the facility. Palliative Care was asked to follow this patient by consultation request of  Diana Shorter, MD to address advance care planning and complex medical decision making. This is a follow up visit.                                   ASSESSMENT AND PLAN / RECOMMENDATIONS:   Advance Care Planning/Goals of Care: Goals include to maximize quality of life and symptom management. Patient/health care surrogate gave his/her permission to discuss.  CODE STATUS: DNR  Symptom Management/Plan:  Late effect CVA, left sided hemiplegia, expressive aphasia- no changes or declines reported. She is dependent for all adl's. Staff anticipates her care needs. staff to continue to assist with daily care needs. Patient occasionally speaks few words. Patient with dysphagia; continue pureed diet. Monitor for aspiration, monitor for functional and cognitive declines, skin breakdown.  Pain-patient with generalized pain secondary to OA, spinal stenosis. Continue current regimen of duragesic patches every 3 days; acetaminophen, ultram and morphine as needed. Monitor for worsening pain/discomfort.  Follow up Palliative Care Visit: Palliative care will continue to follow for complex medical decision making, advance care planning, and clarification of  goals. Return in 8 weeks or prn.   This visit was coded based on medical decision making (MDM).  PPS: 30%  HOSPICE ELIGIBILITY/DIAGNOSIS: TBD  Chief Complaint: Palliative Medicine follow up visit.   HISTORY OF PRESENT ILLNESS:  Diana Mccoy is a 76 y.o. year old female  with Late effect CVA, left sided hemiplegia, expressive aphasia, presbyesophagus, bipolar disorder, schizophrenia, COPD, OA, spinal stenosis, scoliosis, anxiety, hypertension.  Patient resides at [facility]. Patient has been stable per staff. She is dependent for all adl's. She is receiving pureed diet; appetite has been good. She has chronic wound to dorsal surface of left foot. Bilateral foot drop; left > right. No worsening pain. Patient received resting in bed; she is watching television. She shakes her head yes/no to few questions. ROS and HPI mostly obtained per staff. Patient unable to substantially contribute due to her aphasia.  History obtained from review of EMR, discussion with primary team, and interview with family, facility staff/caregiver and/or [name].  I reviewed available labs, medications, imaging, studies and related documents from the EMR.  Records reviewed and summarized above.   ROS  Unable to contribute d/t aphasia.  Physical Exam: Weight: 145.8 pounds Pulse 100, resp 20 Constitutional: NAD General: frail appearing  EYES: anicteric sclera, lids intact, no discharge  ENMT: intact hearing, oral mucous membranes moist CV: S1S2, RRR, no LE edema Pulmonary: LCTA, no increased work of breathing, no cough, room air Abdomen: normo-active BS + 4 quadrants, soft and non tender GU: deferred MSK: non-ambulatory; left  hand contracture, left sided hemiplegia Skin: warm and dry, no rashes, dressing CDI to left foot Neuro:  +generalized weakness,  +cognitive impairment Psych: non-anxious affect, A and O to person Hem/lymph/immuno: no widespread bruising   Thank you for the opportunity to  participate in the care of [name].  The palliative care team will continue to follow. Please call our office at [phone] if we can be of additional assistance.   Diana Slocumb, NP   COVID-19 PATIENT SCREENING TOOL Asked and negative response unless otherwise noted:   Have you had symptoms of covid, tested positive or been in contact with someone with symptoms/positive test in the past 5-10 days? No

## 2022-04-28 ENCOUNTER — Other Ambulatory Visit: Payer: Self-pay | Admitting: Student

## 2022-04-28 DIAGNOSIS — Z515 Encounter for palliative care: Secondary | ICD-10-CM | POA: Insufficient documentation

## 2022-04-28 MED ORDER — FENTANYL 25 MCG/HR TD PT72: 1.0000 | MEDICATED_PATCH | TRANSDERMAL | 0 refills | Status: DC

## 2022-04-28 MED ORDER — FENTANYL 12 MCG/HR TD PT72: 1.0000 | MEDICATED_PATCH | TRANSDERMAL | 0 refills | Status: DC

## 2022-04-28 NOTE — Progress Notes (Signed)
Patient needs refill of fentanyl patches.

## 2022-04-30 ENCOUNTER — Encounter: Payer: Self-pay | Admitting: Student

## 2022-04-30 ENCOUNTER — Non-Acute Institutional Stay (SKILLED_NURSING_FACILITY): Payer: Medicare Other | Admitting: Student

## 2022-04-30 DIAGNOSIS — H1013 Acute atopic conjunctivitis, bilateral: Secondary | ICD-10-CM | POA: Diagnosis not present

## 2022-04-30 NOTE — Progress Notes (Unsigned)
Location:  Other [facility]) [facility] Home Room Number: 712-W Place of Service:  SNF [phone]) Provider:  Dewayne Shorter, MD  Patient Care Team: Dewayne Shorter, MD as PCP - General (Family Medicine) Corey Skains, MD as Consulting Physician (Cardiology) [facility]  Extended Emergency Contact Information Primary Emergency Contact: [name] Address: [address]          [place], [place] [address] Johnnette Litter of [place] Phone: [phone] Relation: Daughter  Code Status:  DNR Goals of care: Advanced Directive information    04/30/2022    4:12 PM  Advanced Directives  Does Patient Have a Medical Advance Directive? Yes  Type of Advance Directive Out of facility DNR (pink MOST or yellow form)  Does patient want to make changes to medical advance directive? No - Patient declined     Chief Complaint  Patient presents with   Acute Visit    Eye redness. Vitals and medications are a reflection of [facility] system, [employer] [facility]      HPI:  Pt is a 76 y.o. female seen today for an acute visit for nurse report of patient with red eyes. Patient is non-verbal after a stroke. She answers inconsistently to yes or no questions for pain stating that her back and bottom hurt. She nods yes to some irritation in her eyes.   Her family friend is at bedside.    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   Disorder of kidney 12/28/2014   Gait instability 04/18/2015   Osteoarthritis 03/28/2015   Presbyesophagus 04/16/2016   Per MBS 04/2016   Schizophrenia ([place]) 03/28/2015   Scoliosis 12/28/2014   Spinal stenosis 12/28/2014   Past Surgical History:   Procedure Laterality Date   HIP FRACTURE SURGERY Left     Allergies  Allergen Reactions   Ciprofloxacin Nausea And Vomiting   Penicillins     rash, hives   Sodium Thiosulfate    Sulfa Antibiotics     Outpatient Encounter Medications as of 04/30/2022  Medication Sig   acetaminophen (TYLENOL) 325 MG tablet Take 650 mg by mouth every 4 (four) hours as needed (for discomfort or elevated temp and notify MD if no relief).   aluminum hydroxide (DERMAGRAN) ointment Apply 1 Application topically as directed. Apply to coccyx, buttocks, and peri area topically every shift after each brief change   aspirin EC 81 MG EC tablet Take 1 tablet (81 mg total) by mouth daily.   Carboxymethylcellulose Sodium (ARTIFICIAL TEARS OP) Apply 1 drop to eye as directed. Both eyes, every hour as needed for redness   Cholecalciferol (D3-1000) 25 MCG (1000 UT) tablet Take 1,000 Units by mouth daily.   fentaNYL (DURAGESIC) 12 MCG/HR Place 1 patch onto the skin every 3 (three) days.   fentaNYL (DURAGESIC) 25 MCG/HR Place 1 patch onto the skin every 3 (three) days.   Loratadine (CLARITIN) 10 MG CAPS Take 1 capsule by mouth at bedtime.   Multiple Vitamin (MULTIVITAMIN) tablet Take 1 tablet by mouth at bedtime.   NYAMYC powder Apply 1 Application topically 2 (two) times daily as needed (to groin for moisture rash).   olopatadine (PATADAY) 0.1 % ophthalmic solution Place 1 drop into  both eyes every evening.   sertraline (ZOLOFT) 25 MG tablet Take 25 mg by mouth daily.   Skin Protectants, Misc. (MINERIN CREME EX) Apply 1 Application topically as directed. Bilaterally to legs every day shift for dry skin   UNABLE TO FIND Med Name: Med Pass 2.0, give 4 oz three times daily between meals for wound healing   vitamin B-12 (CYANOCOBALAMIN) 500 MCG tablet Take 1,000 mcg by mouth daily.   No facility-administered encounter medications on file as of 04/30/2022.    Review of Systems  Immunization History  Administered Date(s)  Administered   Influenza Split 04/18/2011   Influenza,inj,Quad PF,6+ Mos 07/22/2015   Pneumococcal Conjugate-13 07/22/2015   Tdap 04/18/2011   Pertinent  Health Maintenance Due  Topic Date Due   INFLUENZA VACCINE  09/30/2022 (Originally 01/30/2022)   DEXA SCAN  Completed   COLONOSCOPY (Pts 45-20yrs Insurance coverage will need to be confirmed)  Discontinued      10/23/2016    3:11 PM 01/29/2019   12:23 PM  Fall Risk  Falls in the past year? Yes   Number of falls in past year - Comments  Emmi Telephone Survey Actual Response =   Patient at Risk for Falls Due to Impaired mobility   Fall risk Follow up Falls prevention discussed    Functional Status Survey:    Vitals:   04/30/22 1611  BP: (!) 153/73  Pulse: 90  Resp: 20  Temp: (!) 97.1 F (36.2 C)  SpO2: 97%  Weight: 145 lb 12.8 oz (66.1 kg)  Height: 5\' 5"  (1.651 m)   Body mass index is 24.26 kg/m. Physical Exam Eyes:     Comments: Bilateral conjunctivae mildly erythematous  Musculoskeletal:     Comments: Contractures of left lower extremity. Patient pulls right hand from under the cover covered in stool -- nursing promptly came to clean.   Neurological:     Mental Status: She is alert. Mental status is at baseline.     Labs reviewed: No results for input(s): "NA", "K", "CL", "CO2", "GLUCOSE", "BUN", "CREATININE", "CALCIUM", "MG", "PHOS" in the last 8760 hours. No results for input(s): "AST", "ALT", "ALKPHOS", "BILITOT", "PROT", "ALBUMIN" in the last 8760 hours. No results for input(s): "WBC", "NEUTROABS", "HGB", "HCT", "MCV", "PLT" in the last 8760 hours. Lab Results  Component Value Date   TSH 1.191 03/06/2017   Lab Results  Component Value Date   HGBA1C 5.7 (H) 03/06/2017   No results found for: "CHOL", "HDL", "LDLCALC", "LDLDIRECT", "TRIG", "CHOLHDL"  Significant Diagnostic Results in last 30 days:  No results found.  Assessment/Plan 1. Allergic conjunctivitis of both eyes Hx of allergies. No recent  sick contact. VSS. Start pataday daily for 7 days.    Family/ staff Communication: nursing   Labs/tests ordered:  none.  05/06/2017, MD, [facility] [phone]

## 2022-05-15 ENCOUNTER — Other Ambulatory Visit: Payer: Self-pay | Admitting: Nurse Practitioner

## 2022-05-15 DIAGNOSIS — Z515 Encounter for palliative care: Secondary | ICD-10-CM

## 2022-05-15 MED ORDER — FENTANYL 12 MCG/HR TD PT72: 1.0000 | MEDICATED_PATCH | TRANSDERMAL | 0 refills | Status: DC

## 2022-05-15 MED ORDER — FENTANYL 25 MCG/HR TD PT72: 1.0000 | MEDICATED_PATCH | TRANSDERMAL | 0 refills | Status: DC

## 2022-06-12 ENCOUNTER — Other Ambulatory Visit: Payer: Self-pay | Admitting: Nurse Practitioner

## 2022-06-12 DIAGNOSIS — Z515 Encounter for palliative care: Secondary | ICD-10-CM

## 2022-06-12 MED ORDER — FENTANYL 25 MCG/HR TD PT72: 1.0000 | MEDICATED_PATCH | TRANSDERMAL | 0 refills | Status: DC

## 2022-06-12 MED ORDER — FENTANYL 12 MCG/HR TD PT72: 1.0000 | MEDICATED_PATCH | TRANSDERMAL | 0 refills | Status: DC

## 2022-06-14 ENCOUNTER — Non-Acute Institutional Stay (SKILLED_NURSING_FACILITY): Payer: Medicare Other | Admitting: Nurse Practitioner

## 2022-06-14 ENCOUNTER — Encounter: Payer: Self-pay | Admitting: Home Modifications

## 2022-06-14 ENCOUNTER — Encounter: Payer: Self-pay | Admitting: Nurse Practitioner

## 2022-06-14 DIAGNOSIS — L03116 Cellulitis of left lower limb: Secondary | ICD-10-CM

## 2022-06-14 NOTE — Progress Notes (Signed)
Location:  Other [facility]) Nursing Home Room Number: 303-A Place of Service:  SNF [phone]) Provider:  Janene Harvey. Janyth Contes, NP   Patient Care Team: Earnestine Mealing, MD as PCP - General (Family Medicine) Lamar Blinks, MD as Consulting Physician (Cardiology) [facility]  Extended Emergency Contact Information Primary Emergency Contact: Tobe Sos Address: [address]          [place], [place] [address] Darden Amber of [place] Home Phone: [phone] Relation: Daughter  Code Status:  DNR Goals of care: Advanced Directive information    06/14/2022    1:48 PM  Advanced Directives  Does Patient Have a Medical Advance Directive? Yes  Type of Advance Directive Out of facility DNR (pink MOST or yellow form)  Does patient want to make changes to medical advance directive? No - Patient declined  Pre-existing out of facility DNR order (yellow form or pink MOST form) Yellow form placed in chart (order not valid for inpatient use)     Chief Complaint  Patient presents with   Acute Visit    Redness around wound. Vitals and medications are a reflection of [facility]MR system, [employer].     HPI:  Pt is a 76 y.o. female seen today for an acute visit at the request of nursing due to increase redness to left foot.  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 Using calcium alginate. Pt crying out in pain when foot is moved or touched. Area around wound has become increasingly red as well.     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   Disorder of kidney 12/28/2014   Gait instability 04/18/2015   Osteoarthritis 03/28/2015   Presbyesophagus  04/16/2016   Per MBS 04/2016   Schizophrenia (HCC) 03/28/2015   Scoliosis 12/28/2014   Spinal stenosis 12/28/2014   Past Surgical History:  Procedure Laterality Date   HIP FRACTURE SURGERY Left     Allergies  Allergen Reactions   Ciprofloxacin Nausea And Vomiting   Penicillins     rash, hives   Sodium Thiosulfate    Sulfa Antibiotics     Outpatient Encounter Medications as of 06/14/2022  Medication Sig   acetaminophen (TYLENOL) 325 MG tablet Take 650 mg by mouth every 4 (four) hours as needed (for discomfort or elevated temp and notify MD if no relief).   aluminum hydroxide (DERMAGRAN) ointment Apply 1 Application topically as directed. Apply to coccyx, buttocks, and peri area topically every shift after each brief change   aspirin EC 81 MG EC tablet Take 1 tablet (81 mg total) by mouth daily.   Carboxymethylcellulose Sodium (ARTIFICIAL TEARS OP) Apply 1 drop to eye as needed. Every hour in both eyes for redness   Cholecalciferol (D3-1000) 25 MCG (1000 UT) tablet Take 1,000 Units by mouth daily.   doxycycline (ADOXA) 100 MG tablet Take 100 mg by mouth 2 (two) times daily.   fentaNYL (DURAGESIC) 12 MCG/HR Place 1 patch onto the skin every 3 (three) days.   fentaNYL (DURAGESIC) 25 MCG/HR Place 1 patch onto the skin every 3 (three) days.   Multiple Vitamin (MULTIVITAMIN) tablet Take 1 tablet by mouth at bedtime.   NYAMYC  powder Apply 1 Application topically 2 (two) times daily as needed (to groin for moisture rash).   UNABLE TO FIND Med Name: Med Pass 2.0, give 4 oz three times daily between meals for wound healing   vitamin B-12 (CYANOCOBALAMIN) 500 MCG tablet Take 1,000 mcg by mouth daily.   [DISCONTINUED] Loratadine (CLARITIN) 10 MG CAPS Take 1 capsule by mouth at bedtime.   [DISCONTINUED] olopatadine (PATADAY) 0.1 % ophthalmic solution Place 1 drop into both eyes every evening.   [DISCONTINUED] sertraline (ZOLOFT) 25 MG tablet Take 25 mg by mouth daily.   [DISCONTINUED] Skin  Protectants, Misc. (MINERIN CREME EX) Apply 1 Application topically as directed. Bilaterally to legs every day shift for dry skin   No facility-administered encounter medications on file as of 06/14/2022.    Review of Systems  Unable to perform ROS: Dementia    Immunization History  Administered Date(s) Administered   Influenza Split 04/18/2011   Influenza,inj,Quad PF,6+ Mos 07/22/2015   Pneumococcal Conjugate-13 07/22/2015   Tdap 04/18/2011   Pertinent  Health Maintenance Due  Topic Date Due   INFLUENZA VACCINE  09/30/2022 (Originally 01/30/2022)   DEXA SCAN  Completed   COLONOSCOPY (Pts 45-62yrs Insurance coverage will need to be confirmed)  Discontinued      10/23/2016    3:11 PM 01/29/2019   12:23 PM  Fall Risk  Falls in the past year? Yes   Number of falls in past year - Comments  Emmi Telephone Survey Actual Response =   Patient at Risk for Falls Due to Impaired mobility   Fall risk Follow up Falls prevention discussed    Functional Status Survey:    Vitals:   06/14/22 1347  BP: 126/68  Pulse: 87  Weight: 141 lb (64 kg)  Height: 5\' 5"  (1.651 m)   Body mass index is 23.46 kg/m. Physical Exam Constitutional:      General: She is not in acute distress.    Appearance: She is not diaphoretic.     Comments: Thin female.   HENT:     Head: Normocephalic and atraumatic.     Mouth/Throat:     Pharynx: No oropharyngeal exudate.  Eyes:     Conjunctiva/sclera: Conjunctivae normal.     Pupils: Pupils are equal, round, and reactive to light.  Cardiovascular:     Rate and Rhythm: Normal rate and regular rhythm.     Heart sounds: Normal heart sounds.  Pulmonary:     Effort: Pulmonary effort is normal.     Breath sounds: Normal breath sounds.  Abdominal:     General: Bowel sounds are normal.     Palpations: Abdomen is soft.  Musculoskeletal:     Cervical back: Normal range of motion and neck supple.     Right lower leg: No edema.     Left lower leg: No edema.   Skin:    General: Skin is warm and dry.     Comments: Left foot with open area, dark, with redness to surrounding tissue, area warm. No drainage.   Neurological:     Mental Status: She is alert.  Psychiatric:        Mood and Affect: Mood normal.     Labs reviewed: No results for input(s): "NA", "K", "CL", "CO2", "GLUCOSE", "BUN", "CREATININE", "CALCIUM", "MG", "PHOS" in the last 8760 hours. No results for input(s): "AST", "ALT", "ALKPHOS", "BILITOT", "PROT", "ALBUMIN" in the last 8760 hours. No results for input(s): "WBC", "NEUTROABS", "HGB", "HCT", "MCV", "PLT" in the last 8760 hours.  Lab Results  Component Value Date   TSH 1.191 03/06/2017   Lab Results  Component Value Date   HGBA1C 5.7 (H) 03/06/2017   No results found for: "CHOL", "HDL", "LDLCALC", "LDLDIRECT", "TRIG", "CHOLHDL"  Significant Diagnostic Results in last 30 days:  No results found.  Assessment/Plan 1. Cellulitis of left lower extremity Will start doxycycline 100 mg BID for 7 days To dc calcium allgiante dressing and use xeroform as area is dry and has no drainage.  -nursing to continue to monitor and change dressing daily.   Janene Harvey. Biagio Borg [facility] [phone]

## 2022-06-14 NOTE — Progress Notes (Signed)
This encounter was created in error - please disregard.

## 2022-06-20 ENCOUNTER — Other Ambulatory Visit: Payer: Self-pay | Admitting: Student

## 2022-06-20 DIAGNOSIS — Z515 Encounter for palliative care: Secondary | ICD-10-CM

## 2022-06-20 MED ORDER — FENTANYL 12 MCG/HR TD PT72: 1.0000 | MEDICATED_PATCH | TRANSDERMAL | 0 refills | Status: DC

## 2022-06-20 MED ORDER — FENTANYL 25 MCG/HR TD PT72: 1.0000 | MEDICATED_PATCH | TRANSDERMAL | 0 refills | Status: DC

## 2022-06-20 NOTE — Progress Notes (Signed)
Refill duragesic

## 2022-07-03 DIAGNOSIS — I69352 Hemiplegia and hemiparesis following cerebral infarction affecting left dominant side: Secondary | ICD-10-CM | POA: Diagnosis not present

## 2022-07-03 DIAGNOSIS — R1312 Dysphagia, oropharyngeal phase: Secondary | ICD-10-CM | POA: Diagnosis not present

## 2022-07-12 ENCOUNTER — Non-Acute Institutional Stay: Payer: Medicare Other | Admitting: Hospice

## 2022-07-12 DIAGNOSIS — Z515 Encounter for palliative care: Secondary | ICD-10-CM

## 2022-07-12 DIAGNOSIS — I69354 Hemiplegia and hemiparesis following cerebral infarction affecting left non-dominant side: Secondary | ICD-10-CM | POA: Diagnosis not present

## 2022-07-12 DIAGNOSIS — R1312 Dysphagia, oropharyngeal phase: Secondary | ICD-10-CM | POA: Diagnosis not present

## 2022-07-12 DIAGNOSIS — G8929 Other chronic pain: Secondary | ICD-10-CM

## 2022-07-12 DIAGNOSIS — I69352 Hemiplegia and hemiparesis following cerebral infarction affecting left dominant side: Secondary | ICD-10-CM | POA: Diagnosis not present

## 2022-07-12 DIAGNOSIS — R52 Pain, unspecified: Secondary | ICD-10-CM | POA: Diagnosis not present

## 2022-07-12 NOTE — Progress Notes (Signed)
    [facility] Consult Note Telephone: [phone]  Fax: [phone]    Date of encounter: 04/20/22 12:10 PM PATIENT NAME: Diana Mccoy [address] [place] [address]   [phone] (home) [phone] (work) DOB: 1945-08-31 MRN: [id number] PRIMARY CARE PROVIDER:    Dewayne Shorter, MD,  [place] [address] [phone]  REFERRING PROVIDER:   Dewayne Shorter, [place],  [place] [address] [phone]  RESPONSIBLE PARTY:    Contact Information     Name Relation Home Work Mobile   [name] Daughter [phone]          I met face to face with patient in the facility. Palliative Care was asked to follow this patient by consultation request of  Dewayne Shorter, MD to address advance care planning and complex medical decision making. This is a follow up visit.  NP called Diana Mccoy and left her voicemail with callback number.                                   ASSESSMENT AND PLAN / RECOMMENDATIONS:   Advance Care Planning/Goals of Care: Goals include to maximize quality of life and symptom management. Patient/health care surrogate gave his/her permission to discuss.  CODE STATUS: DNR  Symptom Management/Plan: Left sided hemiplegia : related to CVA,  with expressive aphasia. She is dependent for all adl's.  Max assist with daily care needs; monitor for functional and cognitive declines, skin breakdown.  Reposition fall consults and to help prevent skin breakdown.  Dysphagia: Continue Pureed diet. Aspiration precaution.   Pain-patient with generalized pain secondary to OA, spinal stenosis. Continue current regimen of duragesic transdermal patches.  Monitor for worsening pain/discomfort.   Follow up Palliative Care Visit: Palliative care will continue to follow for complex medical decision making, advance care planning, and clarification of goals. Return in 8 weeks or prn.  PPS:  30%  HOSPICE ELIGIBILITY/DIAGNOSIS: TBD  Chief Complaint: Palliative Medicine follow up visit.   HISTORY OF PRESENT ILLNESS:  Diana Mccoy is a 77 y.o. year old female  with Late effect CVA, left sided hemiplegia, expressive aphasia, presbyesophagus, bipolar disorder, schizophrenia, COPD, OA, spinal stenosis, scoliosis, anxiety, hypertension. Patient  resting in bed; she is watching television. She shakes her head yes/no to questions; expressive aphasia secondary to CVA.   History obtained from review of EMR, discussion with primary team, and interview with family, facility staff/caregiver and/or Diana Mccoy.  I reviewed available labs, medications, imaging, studies and related documents from the EMR.  Records reviewed and summarized above.    I spent 35 minutes providing this consultation; this includes time spent with patient/family, chart review and documentation. More than 50% of the time in this consultation was spent on counseling and coordinating communication.   Thank you for the opportunity to participate in the care of Diana Mccoy.  The palliative care team will continue to follow. Please call our office at [phone] if we can be of additional assistance.   Teodoro Spray, NP

## 2022-07-13 ENCOUNTER — Non-Acute Institutional Stay (SKILLED_NURSING_FACILITY): Payer: Medicare Other | Admitting: Student

## 2022-07-13 ENCOUNTER — Encounter: Payer: Self-pay | Admitting: Student

## 2022-07-13 DIAGNOSIS — I69320 Aphasia following cerebral infarction: Secondary | ICD-10-CM | POA: Diagnosis not present

## 2022-07-13 DIAGNOSIS — L97329 Non-pressure chronic ulcer of left ankle with unspecified severity: Secondary | ICD-10-CM | POA: Diagnosis not present

## 2022-07-13 DIAGNOSIS — I69354 Hemiplegia and hemiparesis following cerebral infarction affecting left non-dominant side: Secondary | ICD-10-CM | POA: Diagnosis not present

## 2022-07-13 DIAGNOSIS — F2089 Other schizophrenia: Secondary | ICD-10-CM

## 2022-07-13 DIAGNOSIS — J449 Chronic obstructive pulmonary disease, unspecified: Secondary | ICD-10-CM | POA: Diagnosis not present

## 2022-07-13 DIAGNOSIS — F3175 Bipolar disorder, in partial remission, most recent episode depressed: Secondary | ICD-10-CM

## 2022-07-13 DIAGNOSIS — Z515 Encounter for palliative care: Secondary | ICD-10-CM | POA: Diagnosis not present

## 2022-07-13 NOTE — Progress Notes (Signed)
Location:  Other [place].  Nursing Home Room Number: [place] of Service:  SNF [phone]) Provider:  Dr. Amada Kingfisher, MD  Patient Care Team: Dewayne Shorter, MD as PCP - General (Family Medicine) Corey Skains, MD as Consulting Physician (Cardiology) [facility]  Extended Emergency Contact Information Primary Emergency Contact: [name] Address: [address]          [place], [place] [address] Johnnette Litter of [place] Phone: [phone] Relation: Daughter  Code Status:  DNR Goals of care: Advanced Directive information    07/13/2022    9:26 AM  Advanced Directives  Does Patient Have a Medical Advance Directive? Yes  Type of Advance Directive Out of facility DNR (pink MOST or yellow form)  Does patient want to make changes to medical advance directive? No - Patient declined     Chief Complaint  Patient presents with   Medical Management of Chronic Issues    Medical Management of Chronic Issues.     HPI:  Pt is a 77 y.o. female seen today for medical management of chronic diseases.   Her family friend Webb Silversmith is at bedside. She has been a friend for 25 years. She is laughing when I mentioned she looked great this morning.   Briany points to [facility] and touches her heart. I ask, "do you love Webb Silversmith?" And she nods her head yes. She is otherwise nonverbal and bedbound.   She says she has good bowel movements and urinating fine. Good appetite  Leg wound is improving per nursing.    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   Disorder of kidney 12/28/2014   Gait instability 04/18/2015   Osteoarthritis 03/28/2015    Presbyesophagus 04/16/2016   Per MBS 04/2016   Schizophrenia ([place]) 03/28/2015   Scoliosis 12/28/2014   Spinal stenosis 12/28/2014   Past Surgical History:  Procedure Laterality Date   HIP FRACTURE SURGERY Left     Allergies  Allergen Reactions   Ciprofloxacin Nausea And Vomiting   Penicillins     rash, hives   Sodium Thiosulfate    Sulfa Antibiotics     Outpatient Encounter Medications as of 07/13/2022  Medication Sig   acetaminophen (TYLENOL) 325 MG tablet Take 650 mg by mouth every 4 (four) hours as needed (for discomfort or elevated temp and notify MD if no relief).   aspirin EC 81 MG EC tablet Take 1 tablet (81 mg total) by mouth daily.   Carboxymethylcellulose Sodium (ARTIFICIAL TEARS OP) Apply 1 drop to eye as needed. Every hour in both eyes for redness   Cholecalciferol (D3-1000) 25 MCG (1000 UT) tablet Take 1,000 Units by mouth daily.   fentaNYL (DURAGESIC) 12 MCG/HR Place 1 patch onto the skin every 3 (three) days.   fentaNYL (DURAGESIC) 25 MCG/HR Place 1 patch onto the skin every 3 (three) days.   Infant Care Products [facility]) OINT Apply to sacrum, Coccyx,buttocks topically as needed   Multiple Vitamin (MULTIVITAMIN) tablet Take 1 tablet by mouth at bedtime.   nystatin (MYCOSTATIN/NYSTOP) powder Apply to bilateral breast topically as needed for rash twice daily.   UNABLE TO FIND Med Name: Med Pass 2.0, give 4 oz  three times daily between meals for wound healing   vitamin B-12 (CYANOCOBALAMIN) 500 MCG tablet Take 1,000 mcg by mouth daily.   [DISCONTINUED] aluminum hydroxide (DERMAGRAN) ointment Apply 1 Application topically as directed. Apply to coccyx, buttocks, and peri area topically every shift after each brief change   [DISCONTINUED] doxycycline (ADOXA) 100 MG tablet Take 100 mg by mouth 2 (two) times daily.   [DISCONTINUED] [facility] powder Apply 1 Application topically 2 (two) times daily as needed (to groin for moisture rash).   No facility-administered encounter  medications on file as of 07/13/2022.    Review of Systems  Immunization History  Administered Date(s) Administered   Influenza Split 04/18/2011   Influenza,inj,Quad PF,6+ Mos 07/22/2015   Pneumococcal Conjugate-13 07/22/2015   Tdap 04/18/2011   Pertinent  Health Maintenance Due  Topic Date Due   INFLUENZA VACCINE  09/30/2022 (Originally 01/30/2022)   DEXA SCAN  Completed   COLONOSCOPY (Pts 45-39yrs Insurance coverage will need to be confirmed)  Discontinued      10/23/2016    3:11 PM 01/29/2019   12:23 PM  [place] in the past year? Yes   Number of falls in past year - Comments  Emmi Telephone Survey Actual Response =   Patient at Risk for Falls Due to Impaired mobility   Fall risk Follow up Falls prevention discussed    Functional Status Survey:    Vitals:   07/13/22 0917  BP: 134/61  Pulse: 77  Resp: 18  Temp: (!) 97.3 F (36.3 C)  SpO2: 94%  Weight: 143 lb (64.9 kg)  Height: 5\' 5"  (1.651 m)   Body mass index is 23.8 kg/m. Physical Exam Constitutional:      Appearance: Normal appearance.  Cardiovascular:     Rate and Rhythm: Normal rate.     Pulses: Normal pulses.  Pulmonary:     Effort: Pulmonary effort is normal.  Abdominal:     General: Bowel sounds are normal.     Palpations: Abdomen is soft.  Skin:    Comments: Photo below  Neurological:     Mental Status: She is alert.     Labs reviewed: No results for input(s): "NA", "K", "CL", "CO2", "GLUCOSE", "BUN", "CREATININE", "CALCIUM", "MG", "PHOS" in the last 8760 hours. No results for input(s): "AST", "ALT", "ALKPHOS", "BILITOT", "PROT", "ALBUMIN" in the last 8760 hours. No results for input(s): "WBC", "NEUTROABS", "HGB", "HCT", "MCV", "PLT" in the last 8760 hours. Lab Results  Component Value Date   TSH 1.191 03/06/2017   Lab Results  Component Value Date   HGBA1C 5.7 (H) 03/06/2017   No results found for: "CHOL", "HDL", "LDLCALC", "LDLDIRECT", "TRIG", "CHOLHDL"  Significant  Diagnostic Results in last 30 days:  No results found.  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 Nonverbal, but pleasant on exam. Nursing state mood and wound are stable. Continue comfort measure medications with fentanyl patches as ordered. Continue ASA 81 m. Continue Vitamin D3 supplement. PRN Dermacloud and nystatin powder.    Family/ staff Communication: Nursing  Labs/tests ordered:  none

## 2022-07-17 DIAGNOSIS — R1312 Dysphagia, oropharyngeal phase: Secondary | ICD-10-CM | POA: Diagnosis not present

## 2022-07-17 DIAGNOSIS — I69352 Hemiplegia and hemiparesis following cerebral infarction affecting left dominant side: Secondary | ICD-10-CM | POA: Diagnosis not present

## 2022-07-19 ENCOUNTER — Other Ambulatory Visit: Payer: Self-pay | Admitting: Nurse Practitioner

## 2022-07-19 DIAGNOSIS — Z515 Encounter for palliative care: Secondary | ICD-10-CM

## 2022-07-19 MED ORDER — FENTANYL 25 MCG/HR TD PT72: 1.0000 | MEDICATED_PATCH | TRANSDERMAL | 0 refills | Status: DC

## 2022-07-19 MED ORDER — FENTANYL 12 MCG/HR TD PT72: 1.0000 | MEDICATED_PATCH | TRANSDERMAL | 0 refills | Status: DC

## 2022-07-20 DIAGNOSIS — I69352 Hemiplegia and hemiparesis following cerebral infarction affecting left dominant side: Secondary | ICD-10-CM | POA: Diagnosis not present

## 2022-07-20 DIAGNOSIS — R1312 Dysphagia, oropharyngeal phase: Secondary | ICD-10-CM | POA: Diagnosis not present

## 2022-07-23 DIAGNOSIS — I69352 Hemiplegia and hemiparesis following cerebral infarction affecting left dominant side: Secondary | ICD-10-CM | POA: Diagnosis not present

## 2022-07-23 DIAGNOSIS — R1312 Dysphagia, oropharyngeal phase: Secondary | ICD-10-CM | POA: Diagnosis not present

## 2022-07-24 DIAGNOSIS — R1312 Dysphagia, oropharyngeal phase: Secondary | ICD-10-CM | POA: Diagnosis not present

## 2022-07-24 DIAGNOSIS — I69352 Hemiplegia and hemiparesis following cerebral infarction affecting left dominant side: Secondary | ICD-10-CM | POA: Diagnosis not present

## 2022-07-31 DIAGNOSIS — I69352 Hemiplegia and hemiparesis following cerebral infarction affecting left dominant side: Secondary | ICD-10-CM | POA: Diagnosis not present

## 2022-07-31 DIAGNOSIS — R1312 Dysphagia, oropharyngeal phase: Secondary | ICD-10-CM | POA: Diagnosis not present

## 2022-08-02 DIAGNOSIS — I69352 Hemiplegia and hemiparesis following cerebral infarction affecting left dominant side: Secondary | ICD-10-CM | POA: Diagnosis not present

## 2022-08-02 DIAGNOSIS — R1312 Dysphagia, oropharyngeal phase: Secondary | ICD-10-CM | POA: Diagnosis not present

## 2022-08-07 DIAGNOSIS — R1312 Dysphagia, oropharyngeal phase: Secondary | ICD-10-CM | POA: Diagnosis not present

## 2022-08-07 DIAGNOSIS — I69352 Hemiplegia and hemiparesis following cerebral infarction affecting left dominant side: Secondary | ICD-10-CM | POA: Diagnosis not present

## 2022-08-08 DIAGNOSIS — R1312 Dysphagia, oropharyngeal phase: Secondary | ICD-10-CM | POA: Diagnosis not present

## 2022-08-08 DIAGNOSIS — I69352 Hemiplegia and hemiparesis following cerebral infarction affecting left dominant side: Secondary | ICD-10-CM | POA: Diagnosis not present

## 2022-08-13 ENCOUNTER — Other Ambulatory Visit: Payer: Self-pay | Admitting: Student

## 2022-08-13 DIAGNOSIS — Z515 Encounter for palliative care: Secondary | ICD-10-CM

## 2022-08-13 MED ORDER — FENTANYL 25 MCG/HR TD PT72: 1.0000 | MEDICATED_PATCH | TRANSDERMAL | 0 refills | Status: AC

## 2022-08-13 MED ORDER — FENTANYL 12 MCG/HR TD PT72: 1.0000 | MEDICATED_PATCH | TRANSDERMAL | 0 refills | Status: AC

## 2022-08-13 NOTE — Progress Notes (Signed)
Refill of chronic narcotic medication

## 2022-08-15 ENCOUNTER — Non-Acute Institutional Stay: Payer: Medicare Other | Admitting: Hospice

## 2022-08-15 DIAGNOSIS — R1312 Dysphagia, oropharyngeal phase: Secondary | ICD-10-CM

## 2022-08-15 DIAGNOSIS — Z515 Encounter for palliative care: Secondary | ICD-10-CM

## 2022-08-15 DIAGNOSIS — G8929 Other chronic pain: Secondary | ICD-10-CM | POA: Diagnosis not present

## 2022-08-15 DIAGNOSIS — R52 Pain, unspecified: Secondary | ICD-10-CM | POA: Diagnosis not present

## 2022-08-15 DIAGNOSIS — I69354 Hemiplegia and hemiparesis following cerebral infarction affecting left non-dominant side: Secondary | ICD-10-CM

## 2022-08-15 NOTE — Progress Notes (Signed)
    [facility] Consult Note Telephone: [phone]  Fax: [phone]   PATIENT NAME: Diana Mccoy [address] [place] [address]   [phone] (home) [phone] (work) DOB: 01/22/1946 MRN: [id number] PRIMARY CARE PROVIDER:    Dewayne Shorter, [place],  [place] [address] [phone]  REFERRING PROVIDER:   Dewayne Shorter, [place],   [address] [phone]  RESPONSIBLE PARTY:    Contact Information     Name Relation Home Work Mobile   [name] Daughter [phone]          I met face to face with patient in the facility. Palliative Care was asked to follow this patient by consultation request of  Dewayne Shorter, MD to address advance care planning and complex medical decision making. This is a follow up visit.                                    ASSESSMENT AND PLAN / RECOMMENDATIONS:   Advance Care Planning/Goals of Care: Goals include to maximize quality of life and symptom management. Patient/health care surrogate gave his/her permission to discuss.  CODE STATUS: DNR  Symptom Management/Plan: Left sided hemiplegia : chronic, elated to CVA,  with expressive aphasia. She is dependent for all adl's.  Max assist with daily care needs; monitor for functional and cognitive declines, skin breakdown.  Passive and active range of motion as tolerated.  Use gerofoam to distribute weight; reposition to help prevent skin breakdown.  Dysphagia: Continue Pureed diet. Aspiration precaution.  ST consult as needed.  Pain-patient with generalized pain secondary to OA, spinal stenosis. Continue current regimen of duragesic transdermal patches.  Monitor for worsening pain/discomfort.   Follow up Palliative Care Visit: Palliative care will continue to follow for complex medical decision making, advance care planning, and clarification of goals. Return in 8 weeks or prn.  PPS: 30%  HOSPICE  ELIGIBILITY/DIAGNOSIS: TBD  Chief Complaint: Palliative Medicine follow up visit.   HISTORY OF PRESENT ILLNESS:  Diana Mccoy is a 77 y.o. year old female  with multiple morbidities requiring close monitoring/management, with high risk of complications and mortality: Late effect CVA, left sided hemiplegia, expressive aphasia, presbyesophagus, bipolar disorder, schizophrenia, COPD, OA, spinal stenosis, scoliosis, anxiety, pain, hypertension. Patient  resting in bed; she is watching television. She shakes her head yes/no to questions; expressive aphasia secondary to CVA.  FLACC 0.  History obtained from review of EMR, discussion with primary team, and interview with family, facility staff/caregiver and/or Diana Mccoy.  I reviewed available labs, medications, imaging, studies and related documents from the EMR.  Records reviewed and summarized above.    I spent 35 minutes providing this consultation; this includes time spent with patient/family, chart review and documentation. More than 50% of the time in this consultation was spent on counseling and coordinating communication.   Thank you for the opportunity to participate in the care of Diana Mccoy.  The palliative care team will continue to follow. Please call our office at [phone] if we can be of additional assistance.   Teodoro Spray, NP

## 2022-09-06 ENCOUNTER — Encounter: Payer: Self-pay | Admitting: Nurse Practitioner

## 2022-09-06 ENCOUNTER — Non-Acute Institutional Stay: Payer: Medicare Other | Admitting: Hospice

## 2022-09-06 ENCOUNTER — Non-Acute Institutional Stay (SKILLED_NURSING_FACILITY): Payer: Medicare Other | Admitting: Nurse Practitioner

## 2022-09-06 DIAGNOSIS — Z66 Do not resuscitate: Secondary | ICD-10-CM

## 2022-09-06 DIAGNOSIS — G8929 Other chronic pain: Secondary | ICD-10-CM

## 2022-09-06 DIAGNOSIS — Z515 Encounter for palliative care: Secondary | ICD-10-CM

## 2022-09-06 DIAGNOSIS — I69354 Hemiplegia and hemiparesis following cerebral infarction affecting left non-dominant side: Secondary | ICD-10-CM | POA: Diagnosis not present

## 2022-09-06 DIAGNOSIS — F03C3 Unspecified dementia, severe, with mood disturbance: Secondary | ICD-10-CM | POA: Diagnosis not present

## 2022-09-06 DIAGNOSIS — R1312 Dysphagia, oropharyngeal phase: Secondary | ICD-10-CM | POA: Diagnosis not present

## 2022-09-06 DIAGNOSIS — M159 Polyosteoarthritis, unspecified: Secondary | ICD-10-CM

## 2022-09-06 DIAGNOSIS — I69852 Hemiplegia and hemiparesis following other cerebrovascular disease affecting left dominant side: Secondary | ICD-10-CM

## 2022-09-06 DIAGNOSIS — F3175 Bipolar disorder, in partial remission, most recent episode depressed: Secondary | ICD-10-CM

## 2022-09-06 DIAGNOSIS — R52 Pain, unspecified: Secondary | ICD-10-CM | POA: Diagnosis not present

## 2022-09-06 NOTE — Telephone Encounter (Signed)
Opened in Error.

## 2022-09-06 NOTE — Progress Notes (Signed)
Location:  Other [facility]) Nursing Home Room Number: [place] of Service:  SNF (31)  Diana Mccoy K. Dewaine Oats, NP   Patient Care Team: Dewayne Shorter, MD as PCP - General (Family Medicine) Corey Skains, MD as Consulting Physician (Cardiology) [facility]  Extended Emergency Contact Information Primary Emergency Contact: [name] Address: [address]          [place], [place] [address] Johnnette Litter of [place] Phone: [phone] Relation: Daughter  Goals of care: Advanced Directive information    09/06/2022    3:46 PM  Advanced Directives  Does Patient Have a Medical Advance Directive? Yes  Type of Advance Directive Out of facility DNR (pink MOST or yellow form)  Does patient want to make changes to medical advance directive? No - Patient declined  Pre-existing out of facility DNR order (yellow form or pink MOST form) Yellow form placed in chart (order not valid for inpatient use)     Chief Complaint  Patient presents with   Medical Management of Chronic Issues    Routine visit. Discuss need for hep c screening, shingrix, pneumonia vaccine, and td/tdap.     HPI:  Pt is a 77 y.o. female seen today for medical management of chronic disease. Pt with hx of CVA, dementia, bipolar, copd.  She is currently under palliative care, previously under hospice  She prefers to stay in the bed.     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   Disorder of kidney 12/28/2014   Gait instability 04/18/2015   Osteoarthritis 03/28/2015   Presbyesophagus 04/16/2016   Per MBS 04/2016   Schizophrenia ([place]) 03/28/2015   Scoliosis 12/28/2014   Spinal stenosis  12/28/2014   Past Surgical History:  Procedure Laterality Date   HIP FRACTURE SURGERY Left     Allergies  Allergen Reactions   Ciprofloxacin Nausea And Vomiting   Penicillins     rash, hives   Sodium Thiosulfate    Sulfa Antibiotics     Outpatient Encounter Medications as of 09/06/2022  Medication Sig   acetaminophen (TYLENOL) 325 MG tablet Take 650 mg by mouth every 4 (four) hours as needed (for discomfort or elevated temp and notify MD if no relief).   aspirin EC 81 MG EC tablet Take 1 tablet (81 mg total) by mouth daily.   Carboxymethylcellulose Sodium (ARTIFICIAL TEARS OP) Apply 1 drop to eye as needed. Every hour in both eyes for redness   Cholecalciferol (D3-1000) 25 MCG (1000 UT) tablet Take 1,000 Units by mouth daily.   fentaNYL (DURAGESIC) 12 MCG/HR Place 1 patch onto the skin every 3 (three) days.   fentaNYL (DURAGESIC) 25 MCG/HR Place 1 patch onto the skin every 3 (three) days.   Infant Care Products [facility]) OINT Apply to sacrum, Coccyx,buttocks topically as needed   Multiple Vitamin (MULTIVITAMIN) tablet Take 1 tablet by mouth at bedtime.   nystatin (MYCOSTATIN/NYSTOP) powder Apply to bilateral breast topically as needed for rash twice daily.   UNABLE TO FIND Med Name: Med Pass 2.0, give 4 oz three times daily between meals for wound healing   vitamin B-12 (CYANOCOBALAMIN) 500 MCG tablet Take 1,000 mcg by mouth daily.   No facility-administered encounter  medications on file as of 09/06/2022.    Review of Systems  Unable to perform ROS: Dementia     Immunization History  Administered Date(s) Administered   Influenza Split 04/18/2011   Influenza,inj,Quad PF,6+ Mos 07/22/2015   Moderna SARS-COV2 Booster Vaccination 11/18/2020   Moderna Sars-Covid-2 Vaccination 07/13/2019, 08/10/2019   Pneumococcal Conjugate-13 07/22/2015   Tdap 04/18/2011   Pertinent  Health Maintenance Due  Topic Date Due   INFLUENZA VACCINE  09/30/2022 (Originally 01/30/2022)   DEXA SCAN   Completed   COLONOSCOPY (Pts 45-33yrs Insurance coverage will need to be confirmed)  Discontinued      10/23/2016    3:11 PM 01/29/2019   12:23 PM  Chicken in the past year? Yes   Number of falls in past year - Comments  Emmi Telephone Survey Actual Response =   Patient at Risk for Falls Due to Impaired mobility   Fall risk Follow up Falls prevention discussed    Functional Status Survey:    Vitals:   09/06/22 1546  BP: 128/69  Pulse: 80  Weight: 143 lb (64.9 kg)  Height: 5\' 5"  (1.651 m)   Body mass index is 23.8 kg/m. Physical Exam Constitutional:      General: She is not in acute distress.    Appearance: She is well-developed. She is not diaphoretic.     Comments: Thin   HENT:     Head: Normocephalic and atraumatic.     Mouth/Throat:     Pharynx: No oropharyngeal exudate.  Eyes:     Conjunctiva/sclera: Conjunctivae normal.     Pupils: Pupils are equal, round, and reactive to light.  Cardiovascular:     Rate and Rhythm: Normal rate and regular rhythm.     Heart sounds: Normal heart sounds.  Pulmonary:     Effort: Pulmonary effort is normal.     Breath sounds: Normal breath sounds.  Abdominal:     General: Bowel sounds are normal.     Palpations: Abdomen is soft.  Musculoskeletal:     Cervical back: Normal range of motion and neck supple.     Right lower leg: No edema.     Left lower leg: No edema.  Skin:    General: Skin is warm and dry.  Neurological:     Mental Status: She is alert.     Motor: Weakness present.     Gait: Gait abnormal.     Labs reviewed: No results for input(s): "NA", "K", "CL", "CO2", "GLUCOSE", "BUN", "CREATININE", "CALCIUM", "MG", "PHOS" in the last 8760 hours. No results for input(s): "AST", "ALT", "ALKPHOS", "BILITOT", "PROT", "ALBUMIN" in the last 8760 hours. No results for input(s): "WBC", "NEUTROABS", "HGB", "HCT", "MCV", "PLT" in the last 8760 hours. Lab Results  Component Value Date   TSH 1.191 03/06/2017   Lab  Results  Component Value Date   HGBA1C 5.7 (H) 03/06/2017   No results found for: "CHOL", "HDL", "LDLCALC", "LDLDIRECT", "TRIG", "CHOLHDL"  Significant Diagnostic Results in last 30 days:  No results found.  Assessment/Plan 1. 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 Palliative care patient -continues supportive and comfort care.   3. Bipolar disorder, in partial remission, most recent episode depressed ([place]) Stale at this time, no currently on any routine medications.   4. Severe dementia with mood disturbance, unspecified dementia type ([place]) -Stable, no acute changes in cognitive or functional status, continue supportive care.  5. DNR (do not resuscitate) - Do not attempt resuscitation (DNR)  6. Osteoarthritis of multiple joints, unspecified osteoarthritis type -currently on fentanyl patches, discussed attempting dose reduction with nurse at next refill.    Carlos American. [facility] [phone]

## 2022-09-06 NOTE — Progress Notes (Signed)
[facility] Pall[facility] Consult Note Telephone: [phone]  Fax: [phone]   PATIENT NAME: Diana Mccoy [address] [place] [address]   [phone] (home) [phone] (work) DOB: 09-17-1945 MRN: [id number] PRIMARY CARE PROVIDER:    Dewayne Shorter, MD,  [place] [address] [phone]  REFERRING PROVIDER:   Dewayne Shorter, [place],  [place] [address] [phone]  RESPONSIBLE PARTY:    Contact Information     Name Relation Home Work Mobile   [name] Daughter [phone]          I met face to face with patient in the facility. Palliative Care was asked to follow this patient by consultation request of  Dewayne Shorter, MD to address advance care planning and complex medical decision making. This is a follow up visit.   NP called [name] and updated on visit. She shared that patient was in hospice service seven years ago.                                 ASSESSMENT AND PLAN / RECOMMENDATIONS:   Advance Care Planning/Goals of Care: Goals include to maximize quality of life and symptom management.  [name] further clarified goals of care is comfort.  Family will be interested in hospice service when patient can no longer eat or drink. CODE STATUS: DNR  Symptom Management/Plan: Comfort care, per discussion with [name] today.  Left sided hemiplegia : Out of bed daily. Nursing reports she refuses to get out of bed; chronic left-sided hemiplegia related to CVA,  with expressive aphasia. She is dependent for all adl's.  Max assist with daily care needs; monitor for functional and cognitive declines, skin breakdown.  Passive and active range of motion as tolerated.  Continue gerofoam to distribute weight; reposition to help prevent skin breakdown.  Dysphagia: Continue Pureed diet. Aspiration precaution.  ST consult as needed. Pain-patient with generalized pain secondary to OA, spinal  stenosis. Continue current regimen of duragesic transdermal patches.  Monitor for worsening pain/discomfort and adjust plan of care accordingly  Follow up Palliative Care Visit: Palliative care will continue to follow for complex medical decision making, advance care planning, and clarification of goals. Return in 8 weeks or prn.  PPS: 30%  HOSPICE ELIGIBILITY/DIAGNOSIS: TBD  Chief Complaint: Palliative Medicine follow up visit.   HISTORY OF PRESENT ILLNESS:  Diana Mccoy is a 77 y.o. year old female  with multiple morbidities requiring close monitoring/management, with high risk of complications and mortality: Late effect CVA, left sided hemiplegia, expressive aphasia, presbyesophagus, bipolar disorder, schizophrenia, COPD, OA, spinal stenosis, scoliosis, anxiety, pain, hypertension. Patient  resting in bed. She shakes her head yes/no to questions; expressive aphasia secondary to CVA.  FLACC 0.  History obtained from review of EMR, discussion with primary team, and interview with family, facility staff/caregiver and/or Diana Mccoy.  I reviewed available labs, medications, imaging, studies and related documents from the EMR.  Records reviewed and summarized above.    I spent 35 minutes providing this consultation; this includes time spent with patient/family, chart review and documentation. More than 50% of the time in this consultation was spent on counseling and coordinating communication.   Thank you for the opportunity to participate in the care of Diana Mccoy.  The palliative care team will continue to follow. Please call our office at [phone] if we can be of additional assistance.  Teodoro Spray, NP

## 2022-09-14 DIAGNOSIS — B950 Streptococcus, group A, as the cause of diseases classified elsewhere: Secondary | ICD-10-CM | POA: Diagnosis not present

## 2022-09-17 ENCOUNTER — Other Ambulatory Visit: Payer: Self-pay | Admitting: Student

## 2022-09-17 DIAGNOSIS — F1120 Opioid dependence, uncomplicated: Secondary | ICD-10-CM | POA: Insufficient documentation

## 2022-09-17 MED ORDER — FENTANYL 12 MCG/HR TD PT72: 1.0000 | MEDICATED_PATCH | TRANSDERMAL | 0 refills | Status: AC

## 2022-09-17 MED ORDER — FENTANYL 25 MCG/HR TD PT72: 1.0000 | MEDICATED_PATCH | TRANSDERMAL | 0 refills | Status: AC

## 2022-09-17 NOTE — Progress Notes (Signed)
Refill fentanyl rx

## 2022-09-25 ENCOUNTER — Non-Acute Institutional Stay (INDEPENDENT_AMBULATORY_CARE_PROVIDER_SITE_OTHER): Payer: Medicare Other | Admitting: Nurse Practitioner

## 2022-09-25 ENCOUNTER — Encounter: Payer: Self-pay | Admitting: Nurse Practitioner

## 2022-09-25 DIAGNOSIS — Z0000 Encounter for general adult medical examination without abnormal findings: Secondary | ICD-10-CM | POA: Diagnosis not present

## 2022-09-25 NOTE — Patient Instructions (Signed)
[name] , Thank you for taking time to come for your Medicare Wellness Visit. I appreciate your ongoing commitment to your health goals. Please review the following plan we discussed and let me know if I can assist you in the future.   Screening recommendations/referrals: Colonoscopy aged out Mammogram aged out Bone Density declined due to immobility Recommended yearly ophthalmology/optometry visit for glaucoma screening and checkup Recommended yearly dental visit for hygiene and checkup  Vaccinations: Influenza vaccine- due annually in September/October Pneumococcal vaccine will get at [facility] Tdap vaccine declined Shingles vaccine will get at [facility]- has been ordered for 6 months per daughter request.     Advanced directives: on file.   Conditions/risks identified: advanced age. Memory decline.   Next appointment: yearly    Preventive Care 17 Years and Older, Female Preventive care refers to lifestyle choices and visits with your health care provider that can promote health and wellness. What does preventive care include? A yearly physical exam. This is also called an annual well check. Dental exams once or twice a year. Routine eye exams. Ask your health care provider how often you should have your eyes checked. Personal lifestyle choices, including: Daily care of your teeth and gums. Regular physical activity. Eating a healthy diet. Avoiding tobacco and drug use. Limiting alcohol use. Practicing safe sex. Taking low-dose aspirin every day. Taking vitamin and mineral supplements as recommended by your health care provider. What happens during an annual well check? The services and screenings done by your health care provider during your annual well check will depend on your age, overall health, lifestyle risk factors, and family history of disease. Counseling  Your health care provider may ask you questions about your: Alcohol use. Tobacco use. Drug  use. Emotional well-being. Home and relationship well-being. Sexual activity. Eating habits. History of falls. Memory and ability to understand (cognition). Work and work Statistician. Reproductive health. Screening  You may have the following tests or measurements: Height, weight, and BMI. Blood pressure. Lipid and cholesterol levels. These may be checked every 5 years, or more frequently if you are over 43 years old. Skin check. Lung cancer screening. You may have this screening every year starting at age 46 if you have a 30-pack-year history of smoking and currently smoke or have quit within the past 15 years. Fecal occult blood test (FOBT) of the stool. You may have this test every year starting at age 11. Flexible sigmoidoscopy or colonoscopy. You may have a sigmoidoscopy every 5 years or a colonoscopy every 10 years starting at age 62. Hepatitis C blood test. Hepatitis B blood test. Sexually transmitted disease (STD) testing. Diabetes screening. This is done by checking your blood sugar (glucose) after you have not eaten for a while (fasting). You may have this done every 1-3 years. Bone density scan. This is done to screen for osteoporosis. You may have this done starting at age 33. Mammogram. This may be done every 1-2 years. Talk to your health care provider about how often you should have regular mammograms. Talk with your health care provider about your test results, treatment options, and if necessary, the need for more tests. Vaccines  Your health care provider may recommend certain vaccines, such as: Influenza vaccine. This is recommended every year. Tetanus, diphtheria, and acellular pertussis (Tdap, Td) vaccine. You may need a Td booster every 10 years. Zoster vaccine. You may need this after age 66. Pneumococcal 13-valent conjugate (PCV13) vaccine. One dose is recommended after age 69. Pneumococcal  polysaccharide (PPSV23) vaccine. One dose is recommended after age  24. Talk to your health care provider about which screenings and vaccines you need and how often you need them. This information is not intended to replace advice given to you by your health care provider. Make sure you discuss any questions you have with your health care provider. Document Released: 07/15/2015 Document Revised: 03/07/2016 Document Reviewed: 04/19/2015 Elsevier Interactive Patient Education  2017 [employer] Prevention in the Home Falls can cause injuries. They can happen to people of all ages. There are many things you can do to make your home safe and to help prevent falls. What can I do on the outside of my home? Regularly fix the edges of walkways and driveways and fix any cracks. Remove anything that might make you trip as you walk through a door, such as a raised step or threshold. Trim any bushes or trees on the path to your home. Use bright outdoor lighting. Clear any walking paths of anything that might make someone trip, such as rocks or tools. Regularly check to see if handrails are loose or broken. Make sure that both sides of any steps have handrails. Any raised decks and porches should have guardrails on the edges. Have any leaves, snow, or ice cleared regularly. Use sand or salt on walking paths during winter. Clean up any spills in your garage right away. This includes oil or grease spills. What can I do in the bathroom? Use night lights. Install grab bars by the toilet and in the tub and shower. Do not use towel bars as grab bars. Use non-skid mats or decals in the tub or shower. If you need to sit down in the shower, use a plastic, non-slip stool. Keep the floor dry. Clean up any water that spills on the floor as soon as it happens. Remove soap buildup in the tub or shower regularly. Attach bath mats securely with double-sided non-slip rug tape. Do not have throw rugs and other things on the floor that can make you trip. What can I do in the  bedroom? Use night lights. Make sure that you have a light by your bed that is easy to reach. Do not use any sheets or blankets that are too big for your bed. They should not hang down onto the floor. Have a firm chair that has side arms. You can use this for support while you get dressed. Do not have throw rugs and other things on the floor that can make you trip. What can I do in the kitchen? Clean up any spills right away. Avoid walking on wet floors. Keep items that you use a lot in easy-to-reach places. If you need to reach something above you, use a strong step stool that has a grab bar. Keep electrical cords out of the way. Do not use floor polish or wax that makes floors slippery. If you must use wax, use non-skid floor wax. Do not have throw rugs and other things on the floor that can make you trip. What can I do with my stairs? Do not leave any items on the stairs. Make sure that there are handrails on both sides of the stairs and use them. Fix handrails that are broken or loose. Make sure that handrails are as long as the stairways. Check any carpeting to make sure that it is firmly attached to the stairs. Fix any carpet that is loose or worn. Avoid having throw rugs at the top  or bottom of the stairs. If you do have throw rugs, attach them to the floor with carpet tape. Make sure that you have a light switch at the top of the stairs and the bottom of the stairs. If you do not have them, ask someone to add them for you. What else can I do to help prevent falls? Wear shoes that: Do not have high heels. Have rubber bottoms. Are comfortable and fit you well. Are closed at the toe. Do not wear sandals. If you use a stepladder: Make sure that it is fully opened. Do not climb a closed stepladder. Make sure that both sides of the stepladder are locked into place. Ask someone to hold it for you, if possible. Clearly mark and make sure that you can see: Any grab bars or  handrails. First and last steps. Where the edge of each step is. Use tools that help you move around (mobility aids) if they are needed. These include: Canes. Walkers. Scooters. Crutches. Turn on the lights when you go into a dark area. Replace any light bulbs as soon as they burn out. Set up your furniture so you have a clear path. Avoid moving your furniture around. If any of your floors are uneven, fix them. If there are any pets around you, be aware of where they are. Review your medicines with your doctor. Some medicines can make you feel dizzy. This can increase your chance of falling. Ask your doctor what other things that you can do to help prevent falls. This information is not intended to replace advice given to you by your health care provider. Make sure you discuss any questions you have with your health care provider. Document Released: 04/14/2009 Document Revised: 11/24/2015 Document Reviewed: 07/23/2014 Elsevier Interactive Patient Education  2017 [employer].

## 2022-09-25 NOTE — Progress Notes (Signed)
Subjective:   Diana Mccoy is a 77 y.o. female who presents for Medicare Annual (Subsequent) preventive examination.  Review of Systems     Cardiac Risk Factors include: advanced age (>33men, >62 women);hypertension;sedentary lifestyle     Objective:    Today's Vitals   09/25/22 1429  BP: 128/69  Pulse: 80  Temp: 98.1 F (36.7 C)  Weight: 143 lb 3.2 oz (65 kg)  Height: 5\' 5"  (1.651 m)  PainSc: 0-No pain   Body mass index is 23.83 kg/m.     09/25/2022    2:46 PM 09/06/2022    3:46 PM 07/13/2022    9:26 AM 06/14/2022    1:48 PM 04/30/2022    4:12 PM 04/10/2022    2:24 PM 03/11/2017   11:00 AM  Advanced Directives  Does Patient Have a Medical Advance Directive? Yes Yes Yes Yes Yes Yes No  Type of Advance Directive Out of facility DNR (pink MOST or yellow form) Out of facility DNR (pink MOST or yellow form) Out of facility DNR (pink MOST or yellow form) Out of facility DNR (pink MOST or yellow form) Out of facility DNR (pink MOST or yellow form) Out of facility DNR (pink MOST or yellow form)   Does patient want to make changes to medical advance directive? No - Patient declined No - Patient declined No - Patient declined No - Patient declined No - Patient declined No - Patient declined   Would patient like information on creating a medical advance directive?       No - Patient declined  Pre-existing out of facility DNR order (yellow form or pink MOST form) Yellow form placed in chart (order not valid for inpatient use) Yellow form placed in chart (order not valid for inpatient use)  Yellow form placed in chart (order not valid for inpatient use)       Current Medications (verified) Outpatient Encounter Medications as of 09/25/2022  Medication Sig   acetaminophen (TYLENOL) 325 MG tablet Take 650 mg by mouth every 4 (four) hours as needed (for discomfort or elevated temp and notify MD if no relief).   aspirin EC 81 MG EC tablet Take 1 tablet (81 mg total) by mouth daily.    Carboxymethylcellulose Sodium (ARTIFICIAL TEARS OP) Apply 1 drop to eye as needed. Every hour in both eyes for redness   Cholecalciferol (D3-1000) 25 MCG (1000 UT) tablet Take 1,000 Units by mouth daily.   fentaNYL (DURAGESIC) 12 MCG/HR Place 1 patch onto the skin every 3 (three) days.   fentaNYL (DURAGESIC) 25 MCG/HR Place 1 patch onto the skin every 3 (three) days for 15 days.   Infant Care Products [facility]) OINT Apply to sacrum, Coccyx,buttocks topically as needed   Multiple Vitamin (MULTIVITAMIN) tablet Take 1 tablet by mouth at bedtime.   nystatin (MYCOSTATIN/NYSTOP) powder Apply to bilateral breast topically as needed for rash twice daily.   Petrolatum (PETROLEUM JELLY) OINT Apply 1 application  topically daily as needed (to left foot).   vitamin B-12 (CYANOCOBALAMIN) 500 MCG tablet Take 1,000 mcg by mouth daily.   [DISCONTINUED] UNABLE TO FIND Med Name: Med Pass 2.0, give 4 oz three times daily between meals for wound healing   No facility-administered encounter medications on file as of 09/25/2022.    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   Disorder of kidney 12/28/2014   Gait instability 04/18/2015   Osteoarthritis 03/28/2015   Presbyesophagus 04/16/2016   Per MBS 04/2016   Schizophrenia ([place]) 03/28/2015   Scoliosis 12/28/2014   Spinal stenosis 12/28/2014   Past Surgical History:  Procedure Laterality Date   HIP FRACTURE SURGERY Left    Family History  Problem Relation Age of Onset   Gout Father    Social History   Socioeconomic History   Marital status: Divorced     Spouse name: Not on file   Number of children: Not on file   Years of education: Not on file   Highest education level: Not on file  Occupational History   Not on file  Tobacco Use   Smoking status: Every Day    Packs/day: 1.00    Years: 30.00    Additional pack years: 0.00    Total pack years: 30.00    Types: Cigarettes   Smokeless tobacco: Never  Vaping Use   Vaping Use: Never used  Substance and Sexual Activity   Alcohol use: No   Drug use: No   Sexual activity: Not on file  Other Topics Concern   Not on file  Social History Narrative   Not on file   Social Determinants of Health   Financial Resource Strain: Not on file  Food Insecurity: Not on file  Transportation Needs: Not on file  Physical Activity: Not on file  Stress: Not on file  Social Connections: Not on file    Tobacco Counseling Ready to quit: Not Answered Counseling given: Not Answered   Clinical Intake:  Pre-visit preparation completed: Yes  Pain Score: 0-No pain     BMI - recorded: 23 Nutritional Status: BMI of 19-24  Normal Nutritional Risks: None Diabetes: No  How often do you need to have someone help you when you read instructions, pamphlets, or other written materials from your doctor or pharmacy?: 5 - [place] of Daily Living    09/25/2022    2:13 PM  In your present state of health, do you have any difficulty performing the following activities:  Hearing? 1  Vision? 0  Difficulty concentrating or making decisions? 1  Walking or climbing stairs? 1  Dressing or bathing? 1  Doing errands, shopping? 1  Preparing Food and eating ? Y  Using the Toilet? Y  In the past six months, have you accidently leaked urine? Y  Do you have problems with loss of bowel control? Y  Managing your Medications? Y  Managing your Finances? Y  Housekeeping or managing your Housekeeping? Y    Patient Care Team: Dewayne Shorter, MD as PCP - General (Family  Medicine) Corey Skains, MD as Consulting Physician (Cardiology) Ray any recent Medical Services you may have received from other than [facility] providers in the past year (date may be approximate).     Assessment:   This is a routine wellness examination for Diana Mccoy.  Hearing/Vision screen No results found.  Dietary issues and exercise activities discussed: Current Exercise Habits: The patient does not participate in regular exercise at present   Goals Addressed   None    Depression Screen    10/23/2016    3:11 PM  PHQ 2/9  Scores  PHQ - 2 Score 1    Fall Risk    01/29/2019   12:23 PM 10/23/2016    3:11 PM  Fall Risk   Falls in the past year?  Yes  Comment Emmi Telephone Survey: data to providers prior to load   Number falls in past yr:  2 or more  Comment Emmi Telephone Survey Actual Response =    Risk Factor Category   High Fall Risk  Risk for fall due to :  Impaired mobility  Follow up  Falls prevention discussed    FALL RISK PREVENTION PERTAINING TO THE HOME:  Any stairs in or around the home? No  If so, are there any without handrails? No  Home free of loose throw rugs in walkways, pet beds, electrical cords, etc? Yes  Adequate lighting in your home to reduce risk of falls? Yes   ASSISTIVE DEVICES UTILIZED TO PREVENT FALLS:  Life alert? No  Use of a cane, walker or w/c? Yes  Grab bars in the bathroom? Yes  Shower chair or bench in shower? Yes  Elevated toilet seat or a handicapped toilet? Yes   TIMED UP AND GO:  Was the test performed? No .    Cognitive Function:        10/23/2016    3:17 PM  6CIT Screen  What Year? 0 points  What month? 0 points  What time? 0 points  Count back from 20 0 points  Months in reverse 0 points  Repeat phrase 4 points  Total Score 4 points    Immunizations Immunization History  Administered Date(s) Administered   Influenza Split 04/18/2011   Influenza,inj,Quad PF,6+ Mos  07/22/2015   Moderna SARS-COV2 Booster Vaccination 11/18/2020   Moderna Sars-Covid-2 Vaccination 07/13/2019, 08/10/2019   Pneumococcal Conjugate-13 07/22/2015   Tdap 04/18/2011    TDAP status: Due, Education has been provided regarding the importance of this vaccine. Advised may receive this vaccine at local pharmacy or Health Dept. Aware to provide a copy of the vaccination record if obtained from local pharmacy or Health Dept. Verbalized acceptance and understanding.  Flu Vaccine status: Declined, Education has been provided regarding the importance of this vaccine but patient still declined. Advised may receive this vaccine at local pharmacy or Health Dept. Aware to provide a copy of the vaccination record if obtained from local pharmacy or Health Dept. Verbalized acceptance and understanding.  Pneumococcal vaccine status: Due, Education has been provided regarding the importance of this vaccine. Advised may receive this vaccine at local pharmacy or Health Dept. Aware to provide a copy of the vaccination record if obtained from local pharmacy or Health Dept. Verbalized acceptance and understanding.  Covid-19 vaccine status: Information provided on how to obtain vaccines.   Qualifies for Shingles Vaccine? Yes   Zostavax completed No   Shingrix Completed?: No.    Education has been provided regarding the importance of this vaccine. Patient has been advised to call insurance company to determine out of pocket expense if they have not yet received this vaccine. Advised may also receive vaccine at local pharmacy or Health Dept. Verbalized acceptance and understanding.  Screening Tests Health Maintenance  Topic Date Due   Hepatitis C Screening  Never done   Zoster Vaccines- Shingrix (1 of 2) Never done   Pneumonia Vaccine 81+ Years old (2 of 2 - PPSV23 or PCV20) 09/16/2015   DTaP/Tdap/Td (2 - Td or Tdap) 04/17/2021   INFLUENZA VACCINE  09/30/2022 (Originally 01/30/2022)   DEXA SCAN  Completed    HPV VACCINES  Aged Out   COLONOSCOPY (Pts 45-57yrs Insurance coverage will need to be confirmed)  Discontinued   COVID-19 Vaccine  Discontinued    Health Maintenance  Health Maintenance Due  Topic Date Due   Hepatitis C Screening  Never done   Zoster Vaccines- Shingrix (1 of 2) Never done   Pneumonia Vaccine 41+ Years old (2 of 2 - PPSV23 or PCV20) 09/16/2015   DTaP/Tdap/Td (2 - Td or Tdap) 04/17/2021    Colorectal cancer screening: No longer required.   Mammogram status: No longer required due to age.  Bedbound, unable to complete bone density   Lung Cancer Screening: (Low Dose CT Chest recommended if Age 13-80 years, 30 pack-year currently smoking OR have quit w/in 15years.) does qualify however bedbound.   Lung Cancer Screening Referral: na  Additional Screening:  Hepatitis C Screening: does qualify.   Vision Screening: Recommended annual ophthalmology exams for early detection of glaucoma and other disorders of the eye. Is the patient up to date with their annual eye exam?  No  Who is the provider or what is the name of the office in which the patient attends annual eye exams? Unable to complete If pt is not established with a provider, would they like to be referred to a provider to establish care? No .   Dental Screening: Recommended annual dental exams for proper oral hygiene  Community Resource Referral / Chronic Care Management: CRR required this visit?  No   CCM required this visit?  No      Plan:     I have personally reviewed and noted the following in the patient's chart:   Medical and social history Use of alcohol, tobacco or illicit drugs  Current medications and supplements including opioid prescriptions. Patient is currently taking opioid prescriptions. Information provided to patient regarding non-opioid alternatives. Patient advised to discuss non-opioid treatment plan with their provider. Functional ability and status Nutritional  status Physical activity Advanced directives List of other physicians Hospitalizations, surgeries, and ER visits in previous 12 months Vitals Screenings to include cognitive, depression, and falls Referrals and appointments  In addition, I have reviewed and discussed with patient certain preventive protocols, quality metrics, and best practice recommendations. A written personalized care plan for preventive services as well as general preventive health recommendations were provided to patient.     Lauree Chandler, NP   09/25/2022   Place of service: [facility]

## 2022-09-26 DIAGNOSIS — Z23 Encounter for immunization: Secondary | ICD-10-CM | POA: Diagnosis not present

## 2022-10-03 ENCOUNTER — Non-Acute Institutional Stay: Payer: Medicare Other | Admitting: Hospice

## 2022-10-03 ENCOUNTER — Other Ambulatory Visit: Payer: Self-pay | Admitting: Student

## 2022-10-03 DIAGNOSIS — R52 Pain, unspecified: Secondary | ICD-10-CM | POA: Diagnosis not present

## 2022-10-03 DIAGNOSIS — R1312 Dysphagia, oropharyngeal phase: Secondary | ICD-10-CM | POA: Diagnosis not present

## 2022-10-03 DIAGNOSIS — I69354 Hemiplegia and hemiparesis following cerebral infarction affecting left non-dominant side: Secondary | ICD-10-CM | POA: Diagnosis not present

## 2022-10-03 DIAGNOSIS — Z515 Encounter for palliative care: Secondary | ICD-10-CM | POA: Diagnosis not present

## 2022-10-03 DIAGNOSIS — F1120 Opioid dependence, uncomplicated: Secondary | ICD-10-CM

## 2022-10-03 MED ORDER — FENTANYL 25 MCG/HR TD PT72: 1.0000 | MEDICATED_PATCH | TRANSDERMAL | 0 refills | Status: DC

## 2022-10-03 NOTE — Progress Notes (Signed)
Refill of chronic pain medication.  

## 2022-10-03 NOTE — Progress Notes (Signed)
    [facility] Pall[facility] Care Consult Note Telephone: [phone]  Fax: [phone]   PATIENT NAME: Diana Mccoy [address] [place] [address]   [phone] (home) [phone] (work) DOB: Aug 23, 1945 MRN: [id number] PRIMARY CARE PROVIDER:    Dewayne Shorter, [place],  [place] [address] [phone]  REFERRING PROVIDER:   Dewayne Shorter, [place],  [place] [address] [phone]  RESPONSIBLE PARTY:    Contact Information     Name Relation Home Work Mobile   [name] Daughter [phone]          I met face to face with patient in the facility. Palliative Care was asked to follow this patient by consultation request of  Dewayne Shorter, MD to address advance care planning and complex medical decision making. This is a follow up visit.    Patient was in hospice service seven years        ASSESSMENT AND PLAN / RECOMMENDATIONS:   Advance Care Planning/Goals of Care: Goals include to maximize quality of life and symptom management.  Comfort care onlyFamily will be interested in hospice service when patient can no longer eat or drink. CODE STATUS: DNR  Symptom Management/Plan: Comfort care only.   Left sided hemiplegia :  Max assist with daily care needs; monitor for functional and cognitive declines, skin breakdown.  Passive and active range of motion as tolerated.  Continue gerofoam to distribute weight; reposition to help prevent skin breakdown. Fall precautions.   Dysphagia: Continue Pureed diet, thickened liquid. Aspiration precaution.  ST consult as needed.  Pain- patient with generalized pain secondary to OA, spinal stenosis.   Continue current regimen of duragesic transdermal patches.  Monitor for worsening pain/discomfort and adjust plan of care accordingly  Follow up Palliative Care Visit: Palliative care will continue to follow for complex medical decision making, advance care planning,  and clarification of goals. Return in 8 weeks or prn.  PPS: 30%  HOSPICE ELIGIBILITY/DIAGNOSIS: TBD  Chief Complaint: Palliative Medicine follow up visit.   HISTORY OF PRESENT ILLNESS:  Diana Mccoy is a 77 y.o. year old female  with multiple morbidities requiring close monitoring/management, with high risk of complications and mortality: Late effect CVA, left sided hemiplegia, expressive aphasia, presbyesophagus, bipolar disorder, schizophrenia, COPD, OA, spinal stenosis, scoliosis, anxiety, pain, hypertension.Patient  resting in bed, in no acute distress. She shakes her head yes/no to questions; expressive aphasia secondary to CVA.  Nursing reports patient is eating well, tolerating thickened liquids well, no skin breakdown, and continuing to watch golf and [employer] movies on TV FLACC 0.  History obtained from review of EMR, discussion with primary team, and interview with family, facility staff/caregiver and/or [name].  I reviewed available labs, medications, imaging, studies and related documents from the EMR.  Records reviewed and summarized above.    I spent 35 minutes providing this consultation; this includes time spent with patient/family, chart review and documentation. More than 50% of the time in this consultation was spent on counseling and coordinating communication.   Thank you for the opportunity to participate in the care of [name].  The palliative care team will continue to follow. Please call our office at [phone] if we can be of additional assistance.   Teodoro Spray, NP

## 2022-10-03 NOTE — Telephone Encounter (Signed)
    important  Maximum MME cannot be calculated for this prescription. Enter discrete sig details to calculate maximum MME.    

## 2022-10-29 ENCOUNTER — Other Ambulatory Visit: Payer: Self-pay | Admitting: Student

## 2022-10-29 DIAGNOSIS — F1120 Opioid dependence, uncomplicated: Secondary | ICD-10-CM

## 2022-10-29 MED ORDER — FENTANYL 25 MCG/HR TD PT72: 1.0000 | MEDICATED_PATCH | TRANSDERMAL | 0 refills | Status: DC

## 2022-10-29 NOTE — Progress Notes (Signed)
Refill chronic pain medication opioid dependence.

## 2022-11-14 ENCOUNTER — Non-Acute Institutional Stay (SKILLED_NURSING_FACILITY): Payer: Medicare Other | Admitting: Student

## 2022-11-14 ENCOUNTER — Encounter: Payer: Self-pay | Admitting: Student

## 2022-11-14 DIAGNOSIS — I69354 Hemiplegia and hemiparesis following cerebral infarction affecting left non-dominant side: Secondary | ICD-10-CM

## 2022-11-14 DIAGNOSIS — J449 Chronic obstructive pulmonary disease, unspecified: Secondary | ICD-10-CM

## 2022-11-14 DIAGNOSIS — F1120 Opioid dependence, uncomplicated: Secondary | ICD-10-CM

## 2022-11-14 DIAGNOSIS — F3175 Bipolar disorder, in partial remission, most recent episode depressed: Secondary | ICD-10-CM | POA: Diagnosis not present

## 2022-11-14 DIAGNOSIS — F2089 Other schizophrenia: Secondary | ICD-10-CM

## 2022-11-14 NOTE — Progress Notes (Signed)
Location:  Other [facility].  Nursing Home Room Number: [facility] [address] Place of Service:  SNF [id number]) Provider:  Earnestine Mealing, MD  Patient Care Team: Earnestine Mealing, MD as PCP - General (Family Medicine) Lamar Blinks, MD as Consulting Physician (Cardiology) [facility] [place]  Extended Emergency Contact Information Primary Emergency Contact: Tobe Sos Address: [address]          [place], [place] [address] [place] of [place] Home Phone: [phone] Relation: Daughter  Code Status:  DNR Goals of care: Advanced Directive information    11/14/2022   10:18 AM  Advanced Directives  Does Patient Have a Medical Advance Directive? Yes  Type of Advance Directive Out of facility DNR (pink MOST or yellow form)  Does patient want to make changes to medical advance directive? No - Patient declined     Chief Complaint  Patient presents with   Medical Management of Chronic Issues    Medical Management of Chronic Issues.     HPI:  Pt is a 77 y.o. female seen today for medical management of chronic diseases.    Patient is minimally verbal. She is able to nod yes and no to answer questions. She often moans without being touched.   She nods "yes" that she likes Golf and that golf is her favorite sport. She denies pain at this time.   Nursing is without concerns at this time.   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   Disorder of kidney 12/28/2014   Gait instability 04/18/2015   Osteoarthritis 03/28/2015   Presbyesophagus 04/16/2016   Per MBS 04/2016   Schizophrenia (HCC) 03/28/2015   Scoliosis 12/28/2014   Spinal stenosis 12/28/2014   Past Surgical  History:  Procedure Laterality Date   HIP FRACTURE SURGERY Left     Allergies  Allergen Reactions   Ciprofloxacin Nausea And Vomiting   Penicillins     rash, hives   Sodium Thiosulfate    Sulfa Antibiotics     Outpatient Encounter Medications as of 11/14/2022  Medication Sig   acetaminophen (TYLENOL) 325 MG tablet Take 650 mg by mouth every 4 (four) hours as needed (for discomfort or elevated temp and notify MD if no relief).   aspirin EC 81 MG EC tablet Take 1 tablet (81 mg total) by mouth daily.   Carboxymethylcellulose Sodium (ARTIFICIAL TEARS OP) Apply 1 drop to eye as needed. Every hour in both eyes for redness   Cholecalciferol (D3-1000) 25 MCG (1000 UT) tablet Take 1,000 Units by mouth daily.   fentaNYL (DURAGESIC) 25 MCG/HR Place 1 patch onto the skin every 3 (three) days.   Infant Care Products [facility]) OINT Apply to sacrum, Coccyx,buttocks topically as needed   Multiple Vitamin (MULTIVITAMIN) tablet Take 1 tablet by mouth at bedtime.   nystatin (MYCOSTATIN/NYSTOP) powder Apply to bilateral breast topically as needed for rash twice daily.   Petrolatum (PETROLEUM JELLY) OINT Apply 1 application  topically daily as needed (to left foot).   vitamin B-12 (CYANOCOBALAMIN) 500 MCG tablet Take 1,000 mcg by mouth daily.   No facility-administered encounter medications on file as of 11/14/2022.    Review of Systems  Immunization History  Administered Date(s) Administered  Influenza Split 04/18/2011   Influenza,inj,Quad PF,6+ Mos 07/22/2015   Moderna SARS-COV2 Booster Vaccination 11/18/2020   Moderna Sars-Covid-2 Vaccination 07/13/2019, 08/10/2019, 05/13/2020   Pneumococcal Conjugate-13 07/22/2015   Tdap 04/18/2011   Pertinent  Health Maintenance Due  Topic Date Due   INFLUENZA VACCINE  01/31/2023   DEXA SCAN  Completed   COLONOSCOPY (Pts 45-55yrs Insurance coverage will need to be confirmed)  Discontinued      10/23/2016    3:11 PM 01/29/2019   12:23 PM  Fall Risk   Falls in the past year? Yes   Number of falls in past year - Comments  Emmi Telephone Survey Actual Response =   Patient at Risk for Falls Due to Impaired mobility   Fall risk Follow up Falls prevention discussed    Functional Status Survey:    Vitals:   11/14/22 1012  BP: (!) 101/56  Pulse: 84  Resp: 18  Temp: 98 F (36.7 C)  SpO2: 94%  Weight: 145 lb 9.6 oz (66 kg)  Height: 5\' 5"  (1.651 m)   Body mass index is 24.23 kg/m. Physical Exam Cardiovascular:     Pulses: Normal pulses.  Pulmonary:     Effort: Pulmonary effort is normal.  Abdominal:     General: Abdomen is flat.     Palpations: Abdomen is soft.  Skin:    Comments: Long, thick, curved toenails  Neurological:     Mental Status: She is alert.     Labs reviewed: No results for input(s): "NA", "K", "CL", "CO2", "GLUCOSE", "BUN", "CREATININE", "CALCIUM", "MG", "PHOS" in the last 8760 hours. No results for input(s): "AST", "ALT", "ALKPHOS", "BILITOT", "PROT", "ALBUMIN" in the last 8760 hours. No results for input(s): "WBC", "NEUTROABS", "HGB", "HCT", "MCV", "PLT" in the last 8760 hours. Lab Results  Component Value Date   TSH 1.191 03/06/2017   Lab Results  Component Value Date   HGBA1C 5.7 (H) 03/06/2017   No results found for: "CHOL", "HDL", "LDLCALC", "LDLDIRECT", "TRIG", "CHOLHDL"  Significant Diagnostic Results in last 30 days:  No results found.  Assessment/Plan 1. Narcotic dependence (HCC) Patient is on palliative care continue current dosing of fentanyl patch.   2. Hemiparesis affecting left side as late effect of cerebrovascular accident (CVA) (HCC) Continue ASA.   3. Chronic obstructive pulmonary disease, unspecified COPD type (HCC) NO medications at this time, stable respiratory status  4. Bipolar disorder, in partial remission, most recent episode depressed (HCC) Stable currently, no medications.   5. Other schizophrenia (HCC) Stable without medications at this time.     Family/  staff Communication: nursing  Labs/tests ordered:  routine labs

## 2022-11-27 ENCOUNTER — Other Ambulatory Visit: Payer: Self-pay | Admitting: Nurse Practitioner

## 2022-11-27 DIAGNOSIS — F1120 Opioid dependence, uncomplicated: Secondary | ICD-10-CM

## 2022-11-27 MED ORDER — FENTANYL 12 MCG/HR TD PT72: 1.0000 | MEDICATED_PATCH | TRANSDERMAL | 0 refills | Status: DC

## 2022-12-05 ENCOUNTER — Non-Acute Institutional Stay: Payer: Medicare Other | Admitting: Hospice

## 2022-12-05 DIAGNOSIS — I69354 Hemiplegia and hemiparesis following cerebral infarction affecting left non-dominant side: Secondary | ICD-10-CM | POA: Diagnosis not present

## 2022-12-05 DIAGNOSIS — Z515 Encounter for palliative care: Secondary | ICD-10-CM

## 2022-12-05 DIAGNOSIS — R1312 Dysphagia, oropharyngeal phase: Secondary | ICD-10-CM | POA: Diagnosis not present

## 2022-12-05 DIAGNOSIS — R52 Pain, unspecified: Secondary | ICD-10-CM

## 2022-12-05 NOTE — Progress Notes (Signed)
    Therapist, nutritional Palliative Care Consult Note Telephone: [phone]  Fax: [phone]   PATIENT NAME: Diana Mccoy [address] [place] [address]   [phone] (home) [phone] (work) DOB: May 05, 1946 MRN: [id number] PRIMARY CARE PROVIDER:    Earnestine Mealing, MD,  [address] [place] [address] [phone]  REFERRING PROVIDER:   Earnestine Mealing, MD [address] [place],  [place] [address] [phone]  RESPONSIBLE PARTY:    Contact Information     Name Relation Home Work Mobile   Tobe Sos Daughter [phone]          I met face to face with patient in the facility. Palliative Care was asked to follow this patient by consultation request of  Earnestine Mealing, MD to address advance care planning and complex medical decision making. This is a follow up visit.    Patient was in hospice service seven years.  ASSESSMENT AND PLAN / RECOMMENDATIONS:   Advance Care Planning/Goals of Care: Goals include to maximize quality of life and symptom management.  Comfort care onlyFamily will be interested in hospice service when patient can no longer eat or drink. CODE STATUS: DNR  Symptom Management/Plan: Comfort care only.  Patient fairly stable since last visit, no acuities, no hospitalization since last visit.  No changes to plan of care Left sided hemiplegia: Max assist with daily care needs; monitor for functional and cognitive declines, skin breakdown.  Passive and active range of motion as tolerated.  Continue gerofoam to distribute weight; reposition to help prevent skin breakdown. Fall precautions.   Dysphagia: Continue Pureed diet, thickened liquid. Aspiration precaution.  ST consult as needed.  Pain- patient with generalized pain secondary to OA, spinal stenosis.   Continue current regimen of duragesic transdermal patches.  Monitor for worsening pain/discomfort and adjust plan of care accordingly  Follow up Palliative  Care Visit: Palliative care will continue to follow for chronic disease progression, complex medical decision making, advance care planning, and clarification of goals. Return in 8 weeks or prn.  PPS: 30%  HOSPICE ELIGIBILITY/DIAGNOSIS: TBD  Chief Complaint: Palliative Medicine follow up visit.   HISTORY OF PRESENT ILLNESS:  Diana Mccoy is a 77 y.o. year old female  with multiple morbidities requiring close monitoring/management, with high risk of complications and mortality: Late effect CVA, left sided hemiplegia, expressive aphasia, presbyesophagus, bipolar disorder, schizophrenia, COPD, OA, spinal stenosis, scoliosis, anxiety, pain, hypertension.Patient  resting in bed, in no acute distress, later seen feeding herself after set up.  She shakes her head yes/no to questions; expressive aphasia secondary to CVA.  Nursing reports patient is eating well, tolerating thickened liquids well, no skin breakdown, and continuing to watch golf and [employer] movies on TV FLACC 0.  History obtained from review of EMR, discussion with primary team, and interview with family, facility staff/caregiver and/or [name].  I reviewed available labs, medications, imaging, studies and related documents from the EMR.  Records reviewed and summarized above.    I spent 35 minutes providing this consultation; this includes time spent with patient/family, chart review and documentation. More than 50% of the time in this consultation was spent on counseling and coordinating communication.   Thank you for the opportunity to participate in the care of [name].  The palliative care team will continue to follow. Please call our office at [phone] if we can be of additional assistance.   Rosaura Carpenter, NP

## 2023-01-01 ENCOUNTER — Encounter: Payer: Self-pay | Admitting: Nurse Practitioner

## 2023-01-01 ENCOUNTER — Non-Acute Institutional Stay (SKILLED_NURSING_FACILITY): Payer: Medicare Other | Admitting: Nurse Practitioner

## 2023-01-01 DIAGNOSIS — I69354 Hemiplegia and hemiparesis following cerebral infarction affecting left non-dominant side: Secondary | ICD-10-CM

## 2023-01-01 DIAGNOSIS — L602 Onychogryphosis: Secondary | ICD-10-CM

## 2023-01-01 DIAGNOSIS — F03C3 Unspecified dementia, severe, with mood disturbance: Secondary | ICD-10-CM

## 2023-01-01 DIAGNOSIS — F3175 Bipolar disorder, in partial remission, most recent episode depressed: Secondary | ICD-10-CM | POA: Diagnosis not present

## 2023-01-01 DIAGNOSIS — H1031 Unspecified acute conjunctivitis, right eye: Secondary | ICD-10-CM | POA: Diagnosis not present

## 2023-01-01 DIAGNOSIS — F1120 Opioid dependence, uncomplicated: Secondary | ICD-10-CM | POA: Diagnosis not present

## 2023-01-01 NOTE — Progress Notes (Signed)
Location:  Other [facility].  Nursing Home Room Number: [facility] 303A Place of Service:  SNF (31) Abbey Chatters, NP  PCP: Earnestine Mealing, MD  Patient Care Team: Earnestine Mealing, MD as PCP - General (Family Medicine) Lamar Blinks, MD as Consulting Physician (Cardiology) [facility]  Extended Emergency Contact Information Primary Emergency Contact: Tobe Sos Address: [address]          [place], [place] [address] Darden [place] of [place] Home Phone: [phone] Relation: Daughter  Goals of care: Advanced Directive information    01/01/2023   12:02 PM  Advanced Directives  Does Patient Have a Medical Advance Directive? Yes  Type of Advance Directive Out of facility DNR (pink MOST or yellow form)  Does patient want to make changes to medical advance directive? No - Patient declined     Chief Complaint  Patient presents with   Medical Management of Chronic Issues    Medical Management of Chronic Issues.    Acute Visit    Red, Puffy Eyes.     HPI:  Pt is a 77 y.o. female seen today for medical management of chronic disease. Nursing concern of Red, Puffy Eyes started yesterday but worse today.  Pt with dementia and unable to provide hx.   She has been weaned off fentanyl patches without any increase in pain.   She has over grown toenails but family hasn't signed podiatry consult.    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   Disorder of kidney 12/28/2014   Gait instability 04/18/2015   Osteoarthritis 03/28/2015   Presbyesophagus 04/16/2016   Per MBS 04/2016   Schizophrenia (HCC) 03/28/2015   Scoliosis 12/28/2014   Spinal stenosis  12/28/2014   Past Surgical History:  Procedure Laterality Date   HIP FRACTURE SURGERY Left     Allergies  Allergen Reactions   Ciprofloxacin Nausea And Vomiting   Penicillins     rash, hives   Sodium Thiosulfate    Sulfa Antibiotics     Outpatient Encounter Medications as of 01/01/2023  Medication Sig   acetaminophen (TYLENOL) 325 MG tablet Take 650 mg by mouth every 4 (four) hours as needed (for discomfort or elevated temp and notify MD if no relief).   aspirin EC 81 MG EC tablet Take 1 tablet (81 mg total) by mouth daily.   Carboxymethylcellulose Sodium (ARTIFICIAL TEARS OP) Apply 1 drop to eye as needed. Every hour in both eyes for redness   Cholecalciferol (D3-1000) 25 MCG (1000 UT) tablet Take 1,000 Units by mouth daily.   Infant Care Products [facility]) OINT Apply to sacrum, Coccyx,buttocks topically as needed   Multiple Vitamin (MULTIVITAMIN) tablet Take 1 tablet by mouth at bedtime.   nystatin (MYCOSTATIN/NYSTOP) powder Apply to bilateral breast topically as needed for rash twice daily.   Petrolatum (PETROLEUM JELLY) OINT Apply 1 application  topically daily as needed (to left foot).   vitamin B-12 (CYANOCOBALAMIN) 500 MCG tablet Take 1,000 mcg by mouth daily.   [DISCONTINUED] fentaNYL (DURAGESIC) 12 MCG/HR Place 1 patch onto the skin every 3 (three) days.   No facility-administered encounter medications on file as of 01/01/2023.    Review of Systems  Unable to perform ROS:  Dementia     Immunization History  Administered Date(s) Administered   Influenza Split 04/18/2011   Influenza,inj,Quad PF,6+ Mos 07/22/2015   Moderna SARS-COV2 Booster Vaccination 11/18/2020   Moderna Sars-Covid-2 Vaccination 07/13/2019, 08/10/2019, 05/13/2020   Pneumococcal Conjugate-13 07/22/2015   Tdap 04/18/2011   Pertinent  Health Maintenance Due  Topic Date Due   INFLUENZA VACCINE  01/31/2023   DEXA SCAN  Completed   Colonoscopy  Discontinued      10/23/2016    3:11 PM 01/29/2019    12:23 PM  Fall Risk  Falls in the past year? Yes   Number of falls in past year - Comments  Emmi Telephone Survey Actual Response =   Patient at Risk for Falls Due to Impaired mobility   Fall risk Follow up Falls prevention discussed    Functional Status Survey:    Vitals:   01/01/23 1157 01/01/23 1440  BP: (!) 151/79 127/76  Pulse: (!) 112   Resp: 20   Temp: (!) 97.4 F (36.3 C)   SpO2: 97%   Weight: 140 lb 3.2 oz (63.6 kg)   Height: 5\' 5"  (1.651 m)    Body mass index is 23.33 kg/m. Physical Exam Constitutional:      General: She is not in acute distress.    Appearance: She is well-developed. She is not diaphoretic.  HENT:     Head: Normocephalic and atraumatic.     Mouth/Throat:     Pharynx: No oropharyngeal exudate.  Eyes:     General:        Left eye: Discharge present.    Conjunctiva/sclera: Conjunctivae normal.     Pupils: Pupils are equal, round, and reactive to light.  Cardiovascular:     Rate and Rhythm: Normal rate and regular rhythm.     Heart sounds: Normal heart sounds.  Pulmonary:     Effort: Pulmonary effort is normal.     Breath sounds: Normal breath sounds.  Abdominal:     General: Bowel sounds are normal.     Palpations: Abdomen is soft.  Musculoskeletal:     Cervical back: Normal range of motion and neck supple.     Right lower leg: No edema.     Left lower leg: No edema.  Skin:    General: Skin is warm and dry.  Neurological:     Mental Status: She is alert. She is disoriented.     Motor: Weakness present.     Gait: Gait abnormal.  Psychiatric:        Mood and Affect: Mood normal.     Labs reviewed: No results for input(s): "NA", "K", "CL", "CO2", "GLUCOSE", "BUN", "CREATININE", "CALCIUM", "MG", "PHOS" in the last 8760 hours. No results for input(s): "AST", "ALT", "ALKPHOS", "BILITOT", "PROT", "ALBUMIN" in the last 8760 hours. No results for input(s): "WBC", "NEUTROABS", "HGB", "HCT", "MCV", "PLT" in the last 8760 hours. Lab Results   Component Value Date   TSH 1.191 03/06/2017   Lab Results  Component Value Date   HGBA1C 5.7 (H) 03/06/2017   No results found for: "CHOL", "HDL", "LDLCALC", "LDLDIRECT", "TRIG", "CHOLHDL"  Significant Diagnostic Results in last 30 days:  No results found.  Assessment/Plan 1. Acute bacterial conjunctivitis of right eye -will start bacitracin/neomycin/polymxin ophthalmic 0.5 inch strip q 4 hours while awake.    2. Bipolar disorder, in partial remission, most recent episode depressed (HCC) Stable, no signs of mood disturbances.   3. Narcotic dependence (HCC) -has been weaned off fentanyl patches, without signes of pain.  4. Hemiparesis affecting left side as late effect of cerebrovascular accident (CVA) (HCC) -stable, continues with nursing support  5. Severe dementia with mood disturbance, unspecified dementia type (HCC) Advance disease, total care provided by staff.   6. Overgrown nail Nails debrided  10 without complication/bleeding    Patsye Sullivant K. Biagio Borg [facility] [phone]

## 2023-01-25 DIAGNOSIS — Z515 Encounter for palliative care: Secondary | ICD-10-CM | POA: Diagnosis not present

## 2023-02-14 DIAGNOSIS — Z515 Encounter for palliative care: Secondary | ICD-10-CM | POA: Diagnosis not present

## 2023-02-27 DIAGNOSIS — Z515 Encounter for palliative care: Secondary | ICD-10-CM | POA: Diagnosis not present

## 2023-03-11 ENCOUNTER — Non-Acute Institutional Stay (SKILLED_NURSING_FACILITY): Payer: Medicare Other | Admitting: Student

## 2023-03-11 ENCOUNTER — Encounter: Payer: Self-pay | Admitting: Student

## 2023-03-11 DIAGNOSIS — F03C3 Unspecified dementia, severe, with mood disturbance: Secondary | ICD-10-CM | POA: Diagnosis not present

## 2023-03-11 DIAGNOSIS — I69320 Aphasia following cerebral infarction: Secondary | ICD-10-CM

## 2023-03-11 DIAGNOSIS — F1120 Opioid dependence, uncomplicated: Secondary | ICD-10-CM

## 2023-03-11 DIAGNOSIS — F3175 Bipolar disorder, in partial remission, most recent episode depressed: Secondary | ICD-10-CM

## 2023-03-11 DIAGNOSIS — F2089 Other schizophrenia: Secondary | ICD-10-CM

## 2023-03-11 DIAGNOSIS — L97329 Non-pressure chronic ulcer of left ankle with unspecified severity: Secondary | ICD-10-CM

## 2023-03-11 DIAGNOSIS — G40909 Epilepsy, unspecified, not intractable, without status epilepticus: Secondary | ICD-10-CM

## 2023-03-11 DIAGNOSIS — I69354 Hemiplegia and hemiparesis following cerebral infarction affecting left non-dominant side: Secondary | ICD-10-CM | POA: Diagnosis not present

## 2023-03-11 DIAGNOSIS — I69398 Other sequelae of cerebral infarction: Secondary | ICD-10-CM

## 2023-03-11 NOTE — Progress Notes (Signed)
Location:  Other [facility]) Nursing Home Room Number: [facility]-A Place of Service:  SNF [phone]) Provider:  Earnestine Mealing, MD  Patient Care Team: Earnestine Mealing, MD as PCP - General (Family Medicine) Lamar Blinks, MD as Consulting Physician (Cardiology) [facility]  Extended Emergency Contact Information Primary Emergency Contact: Tobe Sos Address: [address]          [place], [place] [address] [place] of [place] Home Phone: [phone] Relation: Daughter  Code Status:  DNR Goals of care: Advanced Directive information    03/11/2023   10:28 AM  Advanced Directives  Does Patient Have a Medical Advance Directive? Yes  Type of Advance Directive Out of facility DNR (pink MOST or yellow form)  Does patient want to make changes to medical advance directive? No - Patient declined  Pre-existing out of facility DNR order (yellow form or pink MOST form) Yellow form placed in chart (order not valid for inpatient use)     Chief Complaint  Patient presents with   Medical Management of Chronic Issues    Routine Visit   Immunizations    Shingrix, Pneumonia, DTAP, and Influenza   Quality Metric Gaps    Hepatitis C Screening    HPI:  Pt is a 77 y.o. female seen today for medical management of chronic diseases.    Patient is non-verbal, nonambulatory. Incontinent of stool and urine. She feeds herself and often drinks numerous protein shakes. She often endorses pain all over her body.   Nursing without concerns at this time.    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   Disorder of kidney 12/28/2014   Gait instability 04/18/2015    Osteoarthritis 03/28/2015   Presbyesophagus 04/16/2016   Per MBS 04/2016   Schizophrenia (HCC) 03/28/2015   Scoliosis 12/28/2014   Spinal stenosis 12/28/2014   Past Surgical History:  Procedure Laterality Date   HIP FRACTURE SURGERY Left     Allergies  Allergen Reactions   Ciprofloxacin Nausea And Vomiting   Penicillins     rash, hives   Sodium Thiosulfate    Sulfa Antibiotics     Outpatient Encounter Medications as of 03/11/2023  Medication Sig   acetaminophen (TYLENOL) 325 MG tablet Take 650 mg by mouth every 4 (four) hours as needed (for discomfort or elevated temp and notify MD if no relief).   aspirin EC 81 MG EC tablet Take 1 tablet (81 mg total) by mouth daily.   Carboxymethylcellulose Sodium (ARTIFICIAL TEARS OP) Apply 1 drop to eye as needed. Every hour in both eyes for redness   Cholecalciferol (D3-1000) 25 MCG (1000 UT) tablet Take 1,000 Units by mouth daily.   Infant Care Products [facility]) OINT Apply to sacrum, Coccyx,buttocks topically as needed   Multiple Vitamin (MULTIVITAMIN) tablet Take 1 tablet by mouth at bedtime.   nystatin (MYCOSTATIN/NYSTOP) powder Apply to bilateral breast topically as needed for rash twice daily.   Petrolatum (PETROLEUM JELLY) OINT Apply 1 application  topically daily as needed (to left foot).   vitamin B-12 (CYANOCOBALAMIN) 500 MCG tablet Take 1,000 mcg by mouth daily.   No facility-administered encounter medications on file as of 03/11/2023.    Review of Systems  Immunization History  Administered Date(s) Administered   Influenza Split 04/18/2011   Influenza,inj,Quad PF,6+ Mos 07/22/2015   Moderna SARS-COV2 Booster Vaccination 11/18/2020   Moderna Sars-Covid-2 Vaccination 07/13/2019, 08/10/2019, 05/13/2020   Pneumococcal Conjugate-13 07/22/2015   Tdap 04/18/2011   Pertinent  Health Maintenance Due  Topic Date Due   INFLUENZA VACCINE  01/31/2023   DEXA SCAN  Completed   Colonoscopy  Discontinued      10/23/2016    3:11 PM  01/29/2019   12:23 PM  Fall Risk  Falls in the past year? Yes --  Number of falls in past year - Comments  Emmi Telephone Survey Actual Response =   Patient at Risk for Falls Due to Impaired mobility   Fall risk Follow up Falls prevention discussed    Functional Status Survey:    Vitals:   03/11/23 1022  BP: (!) 140/58  Pulse: 82  Resp: 20  Temp: (!) 97.5 F (36.4 C)  SpO2: 95%  Weight: 141 lb 9.6 oz (64.2 kg)  Height: 5\' 5"  (1.651 m)   Body mass index is 23.56 kg/m. Physical Exam  Labs reviewed: No results for input(s): "NA", "K", "CL", "CO2", "GLUCOSE", "BUN", "CREATININE", "CALCIUM", "MG", "PHOS" in the last 8760 hours. No results for input(s): "AST", "ALT", "ALKPHOS", "BILITOT", "PROT", "ALBUMIN" in the last 8760 hours. No results for input(s): "WBC", "NEUTROABS", "HGB", "HCT", "MCV", "PLT" in the last 8760 hours. Lab Results  Component Value Date   TSH 1.191 03/06/2017   Lab Results  Component Value Date   HGBA1C 5.7 (H) 03/06/2017   No results found for: "CHOL", "HDL", "LDLCALC", "LDLDIRECT", "TRIG", "CHOLHDL"  Significant Diagnostic Results in last 30 days:  No results found.  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 No recent concerns for outbursts. Remains resistant to certain aspects of care I.e. nail filing. No seizure-like activity. Chronic pain, however, no clear signs of pain at this time. Continue  supportive care.   Family/ staff Communication: nursing  Labs/tests ordered:  none

## 2023-03-13 DIAGNOSIS — M1990 Unspecified osteoarthritis, unspecified site: Secondary | ICD-10-CM | POA: Diagnosis not present

## 2023-03-13 DIAGNOSIS — I69921 Dysphasia following unspecified cerebrovascular disease: Secondary | ICD-10-CM | POA: Diagnosis not present

## 2023-03-13 DIAGNOSIS — I69352 Hemiplegia and hemiparesis following cerebral infarction affecting left dominant side: Secondary | ICD-10-CM | POA: Diagnosis not present

## 2023-03-13 DIAGNOSIS — R54 Age-related physical debility: Secondary | ICD-10-CM | POA: Diagnosis not present

## 2023-03-13 DIAGNOSIS — Z515 Encounter for palliative care: Secondary | ICD-10-CM | POA: Diagnosis not present

## 2023-03-13 DIAGNOSIS — M6281 Muscle weakness (generalized): Secondary | ICD-10-CM | POA: Diagnosis not present

## 2023-04-18 DIAGNOSIS — M6281 Muscle weakness (generalized): Secondary | ICD-10-CM | POA: Diagnosis not present

## 2023-04-18 DIAGNOSIS — Z515 Encounter for palliative care: Secondary | ICD-10-CM | POA: Diagnosis not present

## 2023-04-18 DIAGNOSIS — I69352 Hemiplegia and hemiparesis following cerebral infarction affecting left dominant side: Secondary | ICD-10-CM | POA: Diagnosis not present

## 2023-04-18 DIAGNOSIS — R54 Age-related physical debility: Secondary | ICD-10-CM | POA: Diagnosis not present

## 2023-04-18 DIAGNOSIS — M1990 Unspecified osteoarthritis, unspecified site: Secondary | ICD-10-CM | POA: Diagnosis not present

## 2023-04-18 DIAGNOSIS — I69921 Dysphasia following unspecified cerebrovascular disease: Secondary | ICD-10-CM | POA: Diagnosis not present

## 2023-04-18 DIAGNOSIS — L97421 Non-pressure chronic ulcer of left heel and midfoot limited to breakdown of skin: Secondary | ICD-10-CM | POA: Diagnosis not present

## 2023-05-02 ENCOUNTER — Encounter: Payer: Self-pay | Admitting: Nurse Practitioner

## 2023-05-02 ENCOUNTER — Non-Acute Institutional Stay (SKILLED_NURSING_FACILITY): Payer: Medicare Other | Admitting: Nurse Practitioner

## 2023-05-02 DIAGNOSIS — I69398 Other sequelae of cerebral infarction: Secondary | ICD-10-CM

## 2023-05-02 DIAGNOSIS — G40909 Epilepsy, unspecified, not intractable, without status epilepticus: Secondary | ICD-10-CM | POA: Diagnosis not present

## 2023-05-02 DIAGNOSIS — F2089 Other schizophrenia: Secondary | ICD-10-CM

## 2023-05-02 DIAGNOSIS — I69354 Hemiplegia and hemiparesis following cerebral infarction affecting left non-dominant side: Secondary | ICD-10-CM

## 2023-05-02 DIAGNOSIS — F03C3 Unspecified dementia, severe, with mood disturbance: Secondary | ICD-10-CM

## 2023-05-02 NOTE — Progress Notes (Signed)
Location:  Other [facility].  Nursing Home Room Number: [facility]A Place of Service:  SNF (31) Abbey Chatters, NP  PCP: Earnestine Mealing, MD  Patient Care Team: Earnestine Mealing, MD as PCP - General (Family Medicine) Lamar Blinks, MD as Consulting Physician (Cardiology) [facility]  Extended Emergency Contact Information Primary Emergency Contact: Tobe Sos Address: [address]          [place], [place] [address] [place] of [place] Home Phone: [phone] Relation: Daughter  Goals of care: Advanced Directive information    05/02/2023   11:18 AM  Advanced Directives  Does Patient Have a Medical Advance Directive? Yes  Type of Advance Directive Out of facility DNR (pink MOST or yellow form)  Does patient want to make changes to medical advance directive? No - Patient declined     Chief Complaint  Patient presents with   Medical Management of Chronic Issues    Medical Management of Chronic Issues.     HPI:  Pt is a 77 y.o. female seen today for medical management of chronic disease. Pt with his of dementia, GERD, anxiety, depression, chronic pain.  She is up in the chair today and doing well.  Friend with her today. No concerns from nursing noted. Continues to eat well.  No skin issues noted. No behavior or mood changes.    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   Disorder of kidney 12/28/2014   Gait instability 04/18/2015   Osteoarthritis 03/28/2015   Presbyesophagus 04/16/2016   Per MBS 04/2016   Schizophrenia (HCC) 03/28/2015   Scoliosis 12/28/2014   Spinal stenosis 12/28/2014   Past Surgical History:  Procedure  Laterality Date   HIP FRACTURE SURGERY Left     Allergies  Allergen Reactions   Ciprofloxacin Nausea And Vomiting   Penicillins     rash, hives   Sodium Thiosulfate    Sulfa Antibiotics     Outpatient Encounter Medications as of 05/02/2023  Medication Sig   acetaminophen (TYLENOL) 325 MG tablet Take 650 mg by mouth every 4 (four) hours as needed (for discomfort or elevated temp and notify MD if no relief).   aspirin EC 81 MG EC tablet Take 1 tablet (81 mg total) by mouth daily.   Carboxymethylcellulose Sodium (ARTIFICIAL TEARS OP) Apply 1 drop to eye as needed. Every hour in both eyes for redness   Cholecalciferol (D3-1000) 25 MCG (1000 UT) tablet Take 1,000 Units by mouth daily.   Infant Care Products [facility]) OINT Apply to sacrum, Coccyx,buttocks topically as needed   Multiple Vitamin (MULTIVITAMIN) tablet Take 1 tablet by mouth at bedtime.   nystatin (MYCOSTATIN/NYSTOP) powder Apply to bilateral breast topically as needed for rash twice daily.   Petrolatum (PETROLEUM JELLY) OINT Apply 1 application  topically daily as needed (to left foot).   triamcinolone cream (KENALOG) 0.5 % Apply 1 Application topically. To top of left foot every day shift   vitamin B-12 (CYANOCOBALAMIN) 500 MCG tablet Take 1,000 mcg by mouth daily.   No facility-administered encounter medications on file as of 05/02/2023.    Review of Systems  Unable to perform ROS: Dementia     Immunization History  Administered Date(s) Administered  Influenza Split 04/18/2011   Influenza,inj,Quad PF,6+ Mos 07/22/2015   Moderna SARS-COV2 Booster Vaccination 11/18/2020   Moderna Sars-Covid-2 Vaccination 07/13/2019, 08/10/2019, 05/13/2020   Pneumococcal Conjugate-13 07/22/2015   Tdap 04/18/2011   Pertinent  Health Maintenance Due  Topic Date Due   INFLUENZA VACCINE  01/31/2023   DEXA SCAN  Completed   Colonoscopy  Discontinued      10/23/2016    3:11 PM 01/29/2019   12:23 PM  Fall Risk  Falls in the past  year? Yes --  Number of falls in past year - Comments  Emmi Telephone Survey Actual Response =   Patient at Risk for Falls Due to Impaired mobility   Fall risk Follow up Falls prevention discussed    Functional Status Survey:    Vitals:   05/02/23 0953 05/02/23 1114  BP: (!) 147/60 (!) 140/58  Pulse: 87   Resp: 18   Temp: (!) 97 F (36.1 C)   SpO2: 97%   Weight: 145 lb (65.8 kg)   Height: 5\' 5"  (1.651 m)    Body mass index is 24.13 kg/m. Physical Exam Constitutional:      General: She is not in acute distress.    Appearance: She is well-developed. She is not diaphoretic.  HENT:     Head: Normocephalic and atraumatic.     Mouth/Throat:     Pharynx: No oropharyngeal exudate.  Eyes:     Conjunctiva/sclera: Conjunctivae normal.     Pupils: Pupils are equal, round, and reactive to light.  Cardiovascular:     Rate and Rhythm: Normal rate and regular rhythm.     Heart sounds: Normal heart sounds.  Pulmonary:     Effort: Pulmonary effort is normal.     Breath sounds: Normal breath sounds.  Abdominal:     General: Bowel sounds are normal.     Palpations: Abdomen is soft.  Musculoskeletal:     Cervical back: Normal range of motion and neck supple.     Right lower leg: No edema.     Left lower leg: No edema.  Skin:    General: Skin is warm and dry.  Neurological:     Mental Status: She is alert. She is disoriented.     Motor: Weakness present.     Gait: Gait abnormal.  Psychiatric:        Mood and Affect: Mood normal.     Labs reviewed: No results for input(s): "NA", "K", "CL", "CO2", "GLUCOSE", "BUN", "CREATININE", "CALCIUM", "MG", "PHOS" in the last 8760 hours. No results for input(s): "AST", "ALT", "ALKPHOS", "BILITOT", "PROT", "ALBUMIN" in the last 8760 hours. No results for input(s): "WBC", "NEUTROABS", "HGB", "HCT", "MCV", "PLT" in the last 8760 hours. Lab Results  Component Value Date   TSH 1.191 03/06/2017   Lab Results  Component Value Date   HGBA1C 5.7  (H) 03/06/2017   No results found for: "CHOL", "HDL", "LDLCALC", "LDLDIRECT", "TRIG", "CHOLHDL"  Significant Diagnostic Results in last 30 days:  No results found.  Assessment/Plan 1. Hemiparesis affecting left side as late effect of cerebrovascular accident (CVA) (HCC) -stable, continue with full nursing care for ADLs -continues on asa  2. Other schizophrenia (HCC) Mood has been stable.   3. Severe dementia with mood disturbance, unspecified dementia type (HCC) -advanced dementia. Continue supportive care from nursing.   4. 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   Janene Harvey. Biagio Borg [facility] [phone]

## 2023-05-16 DIAGNOSIS — R54 Age-related physical debility: Secondary | ICD-10-CM | POA: Diagnosis not present

## 2023-05-16 DIAGNOSIS — M6281 Muscle weakness (generalized): Secondary | ICD-10-CM | POA: Diagnosis not present

## 2023-05-16 DIAGNOSIS — I69921 Dysphasia following unspecified cerebrovascular disease: Secondary | ICD-10-CM | POA: Diagnosis not present

## 2023-05-16 DIAGNOSIS — I69352 Hemiplegia and hemiparesis following cerebral infarction affecting left dominant side: Secondary | ICD-10-CM | POA: Diagnosis not present

## 2023-05-16 DIAGNOSIS — Z515 Encounter for palliative care: Secondary | ICD-10-CM | POA: Diagnosis not present

## 2023-05-16 DIAGNOSIS — M1990 Unspecified osteoarthritis, unspecified site: Secondary | ICD-10-CM | POA: Diagnosis not present

## 2023-05-16 DIAGNOSIS — L97421 Non-pressure chronic ulcer of left heel and midfoot limited to breakdown of skin: Secondary | ICD-10-CM | POA: Diagnosis not present

## 2023-06-12 ENCOUNTER — Encounter: Payer: Self-pay | Admitting: Student

## 2023-06-12 ENCOUNTER — Non-Acute Institutional Stay: Payer: Self-pay | Admitting: Student

## 2023-06-12 DIAGNOSIS — L97329 Non-pressure chronic ulcer of left ankle with unspecified severity: Secondary | ICD-10-CM

## 2023-06-12 DIAGNOSIS — J449 Chronic obstructive pulmonary disease, unspecified: Secondary | ICD-10-CM

## 2023-06-12 DIAGNOSIS — I69352 Hemiplegia and hemiparesis following cerebral infarction affecting left dominant side: Secondary | ICD-10-CM | POA: Diagnosis not present

## 2023-06-12 DIAGNOSIS — G40909 Epilepsy, unspecified, not intractable, without status epilepticus: Secondary | ICD-10-CM

## 2023-06-12 DIAGNOSIS — M1990 Unspecified osteoarthritis, unspecified site: Secondary | ICD-10-CM | POA: Diagnosis not present

## 2023-06-12 DIAGNOSIS — F3175 Bipolar disorder, in partial remission, most recent episode depressed: Secondary | ICD-10-CM

## 2023-06-12 DIAGNOSIS — I69921 Dysphasia following unspecified cerebrovascular disease: Secondary | ICD-10-CM | POA: Diagnosis not present

## 2023-06-12 DIAGNOSIS — F2089 Other schizophrenia: Secondary | ICD-10-CM

## 2023-06-12 DIAGNOSIS — Z515 Encounter for palliative care: Secondary | ICD-10-CM | POA: Diagnosis not present

## 2023-06-12 DIAGNOSIS — R54 Age-related physical debility: Secondary | ICD-10-CM | POA: Diagnosis not present

## 2023-06-12 DIAGNOSIS — M6281 Muscle weakness (generalized): Secondary | ICD-10-CM | POA: Diagnosis not present

## 2023-06-12 DIAGNOSIS — I69320 Aphasia following cerebral infarction: Secondary | ICD-10-CM

## 2023-06-12 DIAGNOSIS — I69398 Other sequelae of cerebral infarction: Secondary | ICD-10-CM

## 2023-06-12 DIAGNOSIS — R634 Abnormal weight loss: Secondary | ICD-10-CM | POA: Diagnosis not present

## 2023-06-12 DIAGNOSIS — F1120 Opioid dependence, uncomplicated: Secondary | ICD-10-CM

## 2023-06-12 DIAGNOSIS — I69354 Hemiplegia and hemiparesis following cerebral infarction affecting left non-dominant side: Secondary | ICD-10-CM | POA: Diagnosis not present

## 2023-06-12 DIAGNOSIS — L97421 Non-pressure chronic ulcer of left heel and midfoot limited to breakdown of skin: Secondary | ICD-10-CM | POA: Diagnosis not present

## 2023-06-12 DIAGNOSIS — F03C3 Unspecified dementia, severe, with mood disturbance: Secondary | ICD-10-CM

## 2023-06-12 NOTE — Progress Notes (Signed)
Location:  Other [facility].  Nursing Home Room Number: [facility] [address] Place of Service:  SNF [phone]) Provider:  Earnestine Mealing, MD  Patient Care Team: Earnestine Mealing, MD as PCP - General (Family Medicine) Lamar Blinks, MD as Consulting Physician (Cardiology) [facility]  Extended Emergency Contact Information Primary Emergency Contact: Tobe Sos Address: [address]          [place], [place] [address] D[place] of [place] Home Phone: [phone] Relation: Daughter  Code Status:  DNR Goals of care: Advanced Directive information    06/12/2023   10:14 AM  Advanced Directives  Does Patient Have a Medical Advance Directive? Yes  Type of Advance Directive Out of facility DNR (pink MOST or yellow form)  Does patient want to make changes to medical advance directive? No - Patient declined     Chief Complaint  Patient presents with   Medical Management of Chronic Issues    Medical Management of Chronic Issues.     HPI:  Pt is a 77 y.o. female seen today for medical management of chronic diseases.  Patient is nonverbal but smiles when asked how she is doing.  She nods her head yes and no to questions.  She nods her head yes to being in pain being controlled with current pain medications. yes to enjoying protein supplementation.  Nursing without acute concerns at this time.  Of note patient has lost 6 pounds in the last 2 months, roughly 5% weight loss.   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   Cholecalciferol (D3-1000) 25 MCG (1000 UT) tablet Take 1,000 Units by mouth daily.   Infant Care Products [facility]) OINT Apply to sacrum, Coccyx,buttocks topically as needed   Multiple Vitamin (MULTIVITAMIN) tablet Take 1 tablet by mouth at bedtime.   Petrolatum (PETROLEUM JELLY) OINT Apply 1 application  topically daily as needed (to left foot).   vitamin B-12 (CYANOCOBALAMIN) 500 MCG tablet Take 1,000 mcg by mouth daily.   [DISCONTINUED] acetaminophen (TYLENOL) 325 MG tablet Take 650 mg by mouth every 4 (four) hours as needed (for discomfort or elevated temp and notify MD if no relief).   [DISCONTINUED] Carboxymethylcellulose Sodium (ARTIFICIAL TEARS OP) Apply 1 drop to eye as needed. Every hour in both eyes for redness   [DISCONTINUED] nystatin (MYCOSTATIN/NYSTOP) powder Apply to bilateral breast topically as needed for rash twice daily.   [DISCONTINUED] triamcinolone cream (KENALOG) 0.5 % Apply 1 Application topically. To top of left foot every day shift   No facility-administered encounter medications  on file as of 06/12/2023.    Review of Systems  Immunization History  Administered Date(s) Administered   Influenza Split 04/18/2011   Influenza,inj,Quad PF,6+ Mos 07/22/2015   Moderna SARS-COV2 Booster Vaccination 11/18/2020   Moderna Sars-Covid-2 Vaccination 07/13/2019, 08/10/2019, 05/13/2020   PNEUMOCOCCAL CONJUGATE-20 09/26/2022   Pneumococcal Conjugate-13 07/22/2015   Tdap 04/18/2011   Pertinent  Health Maintenance Due   Topic Date Due   INFLUENZA VACCINE  01/31/2023   DEXA SCAN  Completed   Colonoscopy  Discontinued      10/23/2016    3:11 PM 01/29/2019   12:23 PM  Fall Risk  Falls in the past year? Yes --  Number of falls in past year - Comments  Emmi Telephone Survey Actual Response =   Patient at Risk for Falls Due to Impaired mobility   Fall risk Follow up Falls prevention discussed    Functional Status Survey:    Vitals:   06/12/23 0958  BP: 113/65  Pulse: 83  Resp: 20  Temp: (!) 97 F (36.1 C)  SpO2: 95%  Weight: 139 lb 6.4 oz (63.2 kg)  Height: 5\' 5"  (1.651 m)   Body mass index is 23.2 kg/m. Physical Exam Cardiovascular:     Rate and Rhythm: Normal rate and regular rhythm.     Pulses: Normal pulses.     Heart sounds: Normal heart sounds.  Pulmonary:     Effort: Pulmonary effort is normal.  Abdominal:     General: Abdomen is flat. Bowel sounds are normal.     Palpations: Abdomen is soft.  Musculoskeletal:        General: No swelling or tenderness.  Skin:    General: Skin is warm and dry.  Neurological:     Mental Status: She is alert.     Comments: Nonverbal  Psychiatric:        Mood and Affect: Mood normal.     Labs reviewed: No results for input(s): "NA", "K", "CL", "CO2", "GLUCOSE", "BUN", "CREATININE", "CALCIUM", "MG", "PHOS" in the last 8760 hours. No results for input(s): "AST", "ALT", "ALKPHOS", "BILITOT", "PROT", "ALBUMIN" in the last 8760 hours. No results for input(s): "WBC", "NEUTROABS", "HGB", "HCT", "MCV", "PLT" in the last 8760 hours. Lab Results  Component Value Date   TSH 1.191 03/06/2017   Lab Results  Component Value Date   HGBA1C 5.7 (H) 03/06/2017   No results found for: "CHOL", "HDL", "LDLCALC", "LDLDIRECT", "TRIG", "CHOLHDL"  Significant Diagnostic Results in last 30 days:  No results found.  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 No recent seizure-like activity. Continued chronic pain, however, managed well with tylenol only. Mood appears to be stable. Left ankle wound currently closed without need for interventions at this time. Patient requires total care. Nonverbal at this time. Followed by palliative care.    Family/ staff Communication: nursing  Labs/tests ordered:  none

## 2023-07-18 DIAGNOSIS — Z515 Encounter for palliative care: Secondary | ICD-10-CM | POA: Diagnosis not present

## 2023-07-18 DIAGNOSIS — M6281 Muscle weakness (generalized): Secondary | ICD-10-CM | POA: Diagnosis not present

## 2023-07-18 DIAGNOSIS — M1990 Unspecified osteoarthritis, unspecified site: Secondary | ICD-10-CM | POA: Diagnosis not present

## 2023-07-18 DIAGNOSIS — L97421 Non-pressure chronic ulcer of left heel and midfoot limited to breakdown of skin: Secondary | ICD-10-CM | POA: Diagnosis not present

## 2023-07-18 DIAGNOSIS — I69921 Dysphasia following unspecified cerebrovascular disease: Secondary | ICD-10-CM | POA: Diagnosis not present

## 2023-07-18 DIAGNOSIS — I69352 Hemiplegia and hemiparesis following cerebral infarction affecting left dominant side: Secondary | ICD-10-CM | POA: Diagnosis not present

## 2023-07-18 DIAGNOSIS — R54 Age-related physical debility: Secondary | ICD-10-CM | POA: Diagnosis not present

## 2023-07-18 DIAGNOSIS — R634 Abnormal weight loss: Secondary | ICD-10-CM | POA: Diagnosis not present

## 2023-08-08 DIAGNOSIS — E119 Type 2 diabetes mellitus without complications: Secondary | ICD-10-CM | POA: Diagnosis not present

## 2023-08-08 DIAGNOSIS — I69352 Hemiplegia and hemiparesis following cerebral infarction affecting left dominant side: Secondary | ICD-10-CM | POA: Diagnosis not present

## 2023-08-08 DIAGNOSIS — J449 Chronic obstructive pulmonary disease, unspecified: Secondary | ICD-10-CM | POA: Diagnosis not present

## 2023-08-08 LAB — HEPATIC FUNCTION PANEL
ALT: 10 U/L (ref 7–35)
AST: 16 (ref 13–35)
Alkaline Phosphatase: 104 (ref 25–125)

## 2023-08-08 LAB — BASIC METABOLIC PANEL
BUN: 19 (ref 4–21)
CO2: 34 — AB (ref 13–22)
Chloride: 102 (ref 99–108)
Creatinine: 0.5 (ref 0.5–1.1)
Glucose: 78
Potassium: 4.2 meq/L (ref 3.5–5.1)
Sodium: 144 (ref 137–147)

## 2023-08-08 LAB — CBC AND DIFFERENTIAL
HCT: 44 (ref 36–46)
Hemoglobin: 14.3 (ref 12.0–16.0)
Neutrophils Absolute: 6960
Platelets: 321 10*3/uL (ref 150–400)
WBC: 11.6

## 2023-08-08 LAB — COMPREHENSIVE METABOLIC PANEL
Albumin: 3.6 (ref 3.5–5.0)
Calcium: 8.9 (ref 8.7–10.7)
Globulin: 2.8
eGFR: 96

## 2023-08-08 LAB — TSH: TSH: 2.31 (ref 0.41–5.90)

## 2023-08-08 LAB — CBC: RBC: 4.44 (ref 3.87–5.11)

## 2023-08-14 DIAGNOSIS — R1312 Dysphagia, oropharyngeal phase: Secondary | ICD-10-CM | POA: Diagnosis not present

## 2023-08-14 DIAGNOSIS — I69352 Hemiplegia and hemiparesis following cerebral infarction affecting left dominant side: Secondary | ICD-10-CM | POA: Diagnosis not present

## 2023-08-14 DIAGNOSIS — M48061 Spinal stenosis, lumbar region without neurogenic claudication: Secondary | ICD-10-CM | POA: Diagnosis not present

## 2023-08-14 DIAGNOSIS — J9691 Respiratory failure, unspecified with hypoxia: Secondary | ICD-10-CM | POA: Diagnosis not present

## 2023-08-14 DIAGNOSIS — J449 Chronic obstructive pulmonary disease, unspecified: Secondary | ICD-10-CM | POA: Diagnosis not present

## 2023-08-14 DIAGNOSIS — G40909 Epilepsy, unspecified, not intractable, without status epilepticus: Secondary | ICD-10-CM | POA: Diagnosis not present

## 2023-08-14 DIAGNOSIS — Z741 Need for assistance with personal care: Secondary | ICD-10-CM | POA: Diagnosis not present

## 2023-08-14 DIAGNOSIS — M6281 Muscle weakness (generalized): Secondary | ICD-10-CM | POA: Diagnosis not present

## 2023-08-17 DIAGNOSIS — M48061 Spinal stenosis, lumbar region without neurogenic claudication: Secondary | ICD-10-CM | POA: Diagnosis not present

## 2023-08-17 DIAGNOSIS — I69352 Hemiplegia and hemiparesis following cerebral infarction affecting left dominant side: Secondary | ICD-10-CM | POA: Diagnosis not present

## 2023-08-17 DIAGNOSIS — G40909 Epilepsy, unspecified, not intractable, without status epilepticus: Secondary | ICD-10-CM | POA: Diagnosis not present

## 2023-08-17 DIAGNOSIS — J9691 Respiratory failure, unspecified with hypoxia: Secondary | ICD-10-CM | POA: Diagnosis not present

## 2023-08-17 DIAGNOSIS — J449 Chronic obstructive pulmonary disease, unspecified: Secondary | ICD-10-CM | POA: Diagnosis not present

## 2023-08-17 DIAGNOSIS — R1312 Dysphagia, oropharyngeal phase: Secondary | ICD-10-CM | POA: Diagnosis not present

## 2023-08-19 DIAGNOSIS — J9691 Respiratory failure, unspecified with hypoxia: Secondary | ICD-10-CM | POA: Diagnosis not present

## 2023-08-19 DIAGNOSIS — R1312 Dysphagia, oropharyngeal phase: Secondary | ICD-10-CM | POA: Diagnosis not present

## 2023-08-19 DIAGNOSIS — I69352 Hemiplegia and hemiparesis following cerebral infarction affecting left dominant side: Secondary | ICD-10-CM | POA: Diagnosis not present

## 2023-08-19 DIAGNOSIS — J449 Chronic obstructive pulmonary disease, unspecified: Secondary | ICD-10-CM | POA: Diagnosis not present

## 2023-08-19 DIAGNOSIS — G40909 Epilepsy, unspecified, not intractable, without status epilepticus: Secondary | ICD-10-CM | POA: Diagnosis not present

## 2023-08-19 DIAGNOSIS — M48061 Spinal stenosis, lumbar region without neurogenic claudication: Secondary | ICD-10-CM | POA: Diagnosis not present

## 2023-08-20 DIAGNOSIS — J449 Chronic obstructive pulmonary disease, unspecified: Secondary | ICD-10-CM | POA: Diagnosis not present

## 2023-08-20 DIAGNOSIS — R1312 Dysphagia, oropharyngeal phase: Secondary | ICD-10-CM | POA: Diagnosis not present

## 2023-08-20 DIAGNOSIS — I69352 Hemiplegia and hemiparesis following cerebral infarction affecting left dominant side: Secondary | ICD-10-CM | POA: Diagnosis not present

## 2023-08-20 DIAGNOSIS — J9691 Respiratory failure, unspecified with hypoxia: Secondary | ICD-10-CM | POA: Diagnosis not present

## 2023-08-20 DIAGNOSIS — M48061 Spinal stenosis, lumbar region without neurogenic claudication: Secondary | ICD-10-CM | POA: Diagnosis not present

## 2023-08-20 DIAGNOSIS — G40909 Epilepsy, unspecified, not intractable, without status epilepticus: Secondary | ICD-10-CM | POA: Diagnosis not present

## 2023-08-21 DIAGNOSIS — G40909 Epilepsy, unspecified, not intractable, without status epilepticus: Secondary | ICD-10-CM | POA: Diagnosis not present

## 2023-08-21 DIAGNOSIS — R1312 Dysphagia, oropharyngeal phase: Secondary | ICD-10-CM | POA: Diagnosis not present

## 2023-08-21 DIAGNOSIS — J9691 Respiratory failure, unspecified with hypoxia: Secondary | ICD-10-CM | POA: Diagnosis not present

## 2023-08-21 DIAGNOSIS — J449 Chronic obstructive pulmonary disease, unspecified: Secondary | ICD-10-CM | POA: Diagnosis not present

## 2023-08-21 DIAGNOSIS — M48061 Spinal stenosis, lumbar region without neurogenic claudication: Secondary | ICD-10-CM | POA: Diagnosis not present

## 2023-08-21 DIAGNOSIS — I69352 Hemiplegia and hemiparesis following cerebral infarction affecting left dominant side: Secondary | ICD-10-CM | POA: Diagnosis not present

## 2023-08-29 ENCOUNTER — Encounter: Payer: Self-pay | Admitting: Nurse Practitioner

## 2023-08-29 ENCOUNTER — Non-Acute Institutional Stay (SKILLED_NURSING_FACILITY): Payer: Self-pay | Admitting: Nurse Practitioner

## 2023-08-29 DIAGNOSIS — M159 Polyosteoarthritis, unspecified: Secondary | ICD-10-CM | POA: Diagnosis not present

## 2023-08-29 DIAGNOSIS — G40909 Epilepsy, unspecified, not intractable, without status epilepticus: Secondary | ICD-10-CM | POA: Diagnosis not present

## 2023-08-29 DIAGNOSIS — F03C3 Unspecified dementia, severe, with mood disturbance: Secondary | ICD-10-CM | POA: Diagnosis not present

## 2023-08-29 DIAGNOSIS — I69398 Other sequelae of cerebral infarction: Secondary | ICD-10-CM | POA: Diagnosis not present

## 2023-08-29 DIAGNOSIS — I69354 Hemiplegia and hemiparesis following cerebral infarction affecting left non-dominant side: Secondary | ICD-10-CM

## 2023-08-29 DIAGNOSIS — F419 Anxiety disorder, unspecified: Secondary | ICD-10-CM

## 2023-08-29 DIAGNOSIS — F2089 Other schizophrenia: Secondary | ICD-10-CM

## 2023-08-29 DIAGNOSIS — J449 Chronic obstructive pulmonary disease, unspecified: Secondary | ICD-10-CM | POA: Diagnosis not present

## 2023-08-29 DIAGNOSIS — L97329 Non-pressure chronic ulcer of left ankle with unspecified severity: Secondary | ICD-10-CM | POA: Diagnosis not present

## 2023-08-29 HISTORY — DX: Epilepsy, unspecified, not intractable, without status epilepticus: G40.909

## 2023-08-29 NOTE — Assessment & Plan Note (Addendum)
 Stable, has healed at this time but nursing continues to monitor and use petroleum jelly ointment as a protectant.

## 2023-08-29 NOTE — Progress Notes (Signed)
 Location:  Other [facility].  Nursing Home Room Number: [facility] [address] Place of Service:  SNF (31) Abbey Chatters, NP  PCP: Earnestine Mealing, MD  Patient Care Team: Earnestine Mealing, MD as PCP - General (Family Medicine) Lamar Blinks, MD as Consulting Physician (Cardiology) [facility]  Extended Emergency Contact Information Primary Emergency Contact: Tobe Sos Address: [address]          [place], [place] [address] [place] of [place] Home Phone: [phone] Relation: Daughter  Goals of care: Advanced Directive information    06/12/2023   10:14 AM  Advanced Directives  Does Patient Have a Medical Advance Directive? Yes  Type of Advance Directive Out of facility DNR (pink MOST or yellow form)  Does patient want to make changes to medical advance directive? No - Patient declined     Chief Complaint  Patient presents with   Medical Management of Chronic Issues    Medical Management of Chronic Issues.     HPI:  Pt is a 78 y.o. female seen today for medical management of chronic disease.  She has advanced dementia and requires care wit ADLs from staff.  She is able to feed herself.  She prefers to stay in the bed.  She had weight loss in the last months.  No signs of worsening pain or discomfort.  Nursing does not have any concerns.    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   Cholecalciferol (D3-1000) 25 MCG (1000 UT) tablet Take 1,000 Units by mouth daily.   Infant Care Products [facility]) OINT Apply to sacrum, Coccyx,buttocks topically as needed   Multiple Vitamin (MULTIVITAMIN) tablet Take 1 tablet by mouth at bedtime.   nystatin cream (MYCOSTATIN) Apply 1 Application topically daily as needed for dry skin.   Petrolatum (PETROLEUM JELLY) OINT Apply 1 application  topically daily as needed (to left foot).   vitamin B-12 (CYANOCOBALAMIN) 500 MCG tablet Take 1,000 mcg by mouth daily.   No facility-administered encounter medications on file as of 08/29/2023.    Review of Systems  Unable to perform ROS: Dementia     Immunization History  Administered Date(s) Administered   Influenza Split 04/18/2011   Influenza,inj,Quad PF,6+ Mos 07/22/2015   Moderna SARS-COV2 Booster Vaccination 11/18/2020   Moderna Sars-Covid-2 Vaccination 07/13/2019, 08/10/2019, 05/13/2020   PNEUMOCOCCAL CONJUGATE-20 09/26/2022   Pneumococcal Conjugate-13 07/22/2015   Tdap 04/18/2011   Pertinent  Health Maintenance Due  Topic Date Due   INFLUENZA VACCINE  01/31/2023   DEXA SCAN  Completed  Colonoscopy  Discontinued      10/23/2016    3:11 PM 01/29/2019   12:23 PM  Fall Risk  Falls in the past year? Yes --  Number of falls in past year - Comments  Emmi Telephone Survey Actual Response =   Patient at Risk for Falls Due to Impaired mobility   Fall risk Follow up Falls prevention discussed    Functional Status Survey:    Vitals:   08/29/23 0818  BP: 124/67  Pulse: 91  Resp: 20  Temp: (!) 96.9 F (36.1 C)  SpO2: 100%  Weight: 135 lb  (61.2 kg)  Height: 5\' 5"  (1.651 m)   Body mass index is 22.47 kg/m. Physical Exam Constitutional:      General: She is not in acute distress.    Appearance: She is well-developed. She is not diaphoretic.  HENT:     Head: Normocephalic and atraumatic.     Mouth/Throat:     Pharynx: No oropharyngeal exudate.  Eyes:     Conjunctiva/sclera: Conjunctivae normal.     Pupils: Pupils are equal, round, and reactive to light.  Cardiovascular:     Rate and Rhythm: Normal rate and regular rhythm.     Heart sounds: Normal heart sounds.  Pulmonary:     Effort: Pulmonary effort is normal.     Breath sounds: Normal breath sounds.  Abdominal:     General: Bowel sounds are normal.     Palpations: Abdomen is soft.  Musculoskeletal:     Cervical back: Normal range of motion and neck supple.     Right lower leg: No edema.     Left lower leg: No edema.  Skin:    General: Skin is warm and dry.  Neurological:     Mental Status: She is alert. She is disoriented.     Motor: Weakness present.     Gait: Gait abnormal.  Psychiatric:        Cognition and Memory: Cognition is impaired. Memory is impaired. She exhibits impaired recent memory and impaired remote memory.     Labs reviewed: Recent Labs    08/08/23 0000  NA 144  K 4.2  CL 102  CO2 34*  BUN 19  CREATININE 0.5  CALCIUM 8.9   Recent Labs    08/08/23 0000  AST 16  ALT 10  ALKPHOS 104  ALBUMIN 3.6   Recent Labs    08/08/23 0000  WBC 11.6  NEUTROABS 6,960.00  HGB 14.3  HCT 44  PLT 321   Lab Results  Component Value Date   TSH 2.31 08/08/2023   Lab Results  Component Value Date   HGBA1C 5.7 (H) 03/06/2017   No results found for: "CHOL", "HDL", "LDLCALC", "LDLDIRECT", "TRIG", "CHOLHDL"  Significant Diagnostic Results in last 30 days:  No results found.  Assessment/Plan Severe dementia with mood disturbance, unspecified dementia type (HCC) Stable, no acute changes in cognitive or functional status, continue  supportive care.   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 Will continue to monitor.   COPD (chronic obstructive pulmonary disease) (HCC) Stable, no signs of shortness of breath, no cough or congestion  Hemiparesis affecting left side as late effect of cerebrovascular accident (CVA) (HCC) Stable, dependant on staff for all ADLs. Continue supportive care.   Chronic ulcer of left ankle, unspecified ulcer stage (HCC) Stable, has healed at this time but nursing continues to monitor and use petroleum  jelly ointment as a protectant.   Anxiety Controlled at this time.   Osteoarthritis Stable, without signs of worsening pain.      Janene Harvey. Biagio Borg [facility] [phone]

## 2023-08-29 NOTE — Assessment & Plan Note (Signed)
 Stable, without signs of worsening pain.

## 2023-08-29 NOTE — Assessment & Plan Note (Signed)
 Stable, no recent seizures, continues ASA

## 2023-08-29 NOTE — Assessment & Plan Note (Signed)
 Stable, no acute changes in cognitive or functional status, continue supportive care.

## 2023-08-29 NOTE — Assessment & Plan Note (Signed)
 Stable, no signs of shortness of breath, no cough or congestion

## 2023-08-29 NOTE — Assessment & Plan Note (Signed)
 Mood and behaviors stable at this time. Will continue to monitor.

## 2023-08-29 NOTE — Assessment & Plan Note (Signed)
 Controlled at this time

## 2023-08-29 NOTE — Assessment & Plan Note (Signed)
 Stable, dependant on staff for all ADLs. Continue supportive care.

## 2023-09-03 DIAGNOSIS — M6281 Muscle weakness (generalized): Secondary | ICD-10-CM | POA: Diagnosis not present

## 2023-09-03 DIAGNOSIS — R1312 Dysphagia, oropharyngeal phase: Secondary | ICD-10-CM | POA: Diagnosis not present

## 2023-09-03 DIAGNOSIS — J9691 Respiratory failure, unspecified with hypoxia: Secondary | ICD-10-CM | POA: Diagnosis not present

## 2023-09-03 DIAGNOSIS — J449 Chronic obstructive pulmonary disease, unspecified: Secondary | ICD-10-CM | POA: Diagnosis not present

## 2023-09-03 DIAGNOSIS — G40909 Epilepsy, unspecified, not intractable, without status epilepticus: Secondary | ICD-10-CM | POA: Diagnosis not present

## 2023-09-03 DIAGNOSIS — I69352 Hemiplegia and hemiparesis following cerebral infarction affecting left dominant side: Secondary | ICD-10-CM | POA: Diagnosis not present

## 2023-09-03 DIAGNOSIS — M48061 Spinal stenosis, lumbar region without neurogenic claudication: Secondary | ICD-10-CM | POA: Diagnosis not present

## 2023-09-03 DIAGNOSIS — Z741 Need for assistance with personal care: Secondary | ICD-10-CM | POA: Diagnosis not present

## 2023-09-04 DIAGNOSIS — J449 Chronic obstructive pulmonary disease, unspecified: Secondary | ICD-10-CM | POA: Diagnosis not present

## 2023-09-04 DIAGNOSIS — G40909 Epilepsy, unspecified, not intractable, without status epilepticus: Secondary | ICD-10-CM | POA: Diagnosis not present

## 2023-09-04 DIAGNOSIS — I69352 Hemiplegia and hemiparesis following cerebral infarction affecting left dominant side: Secondary | ICD-10-CM | POA: Diagnosis not present

## 2023-09-04 DIAGNOSIS — R1312 Dysphagia, oropharyngeal phase: Secondary | ICD-10-CM | POA: Diagnosis not present

## 2023-09-04 DIAGNOSIS — M48061 Spinal stenosis, lumbar region without neurogenic claudication: Secondary | ICD-10-CM | POA: Diagnosis not present

## 2023-09-04 DIAGNOSIS — J9691 Respiratory failure, unspecified with hypoxia: Secondary | ICD-10-CM | POA: Diagnosis not present

## 2023-09-05 DIAGNOSIS — G40909 Epilepsy, unspecified, not intractable, without status epilepticus: Secondary | ICD-10-CM | POA: Diagnosis not present

## 2023-09-05 DIAGNOSIS — R1312 Dysphagia, oropharyngeal phase: Secondary | ICD-10-CM | POA: Diagnosis not present

## 2023-09-05 DIAGNOSIS — I69352 Hemiplegia and hemiparesis following cerebral infarction affecting left dominant side: Secondary | ICD-10-CM | POA: Diagnosis not present

## 2023-09-05 DIAGNOSIS — J449 Chronic obstructive pulmonary disease, unspecified: Secondary | ICD-10-CM | POA: Diagnosis not present

## 2023-09-05 DIAGNOSIS — M48061 Spinal stenosis, lumbar region without neurogenic claudication: Secondary | ICD-10-CM | POA: Diagnosis not present

## 2023-09-05 DIAGNOSIS — J9691 Respiratory failure, unspecified with hypoxia: Secondary | ICD-10-CM | POA: Diagnosis not present

## 2023-09-12 DIAGNOSIS — M48061 Spinal stenosis, lumbar region without neurogenic claudication: Secondary | ICD-10-CM | POA: Diagnosis not present

## 2023-09-12 DIAGNOSIS — G40909 Epilepsy, unspecified, not intractable, without status epilepticus: Secondary | ICD-10-CM | POA: Diagnosis not present

## 2023-09-12 DIAGNOSIS — I69352 Hemiplegia and hemiparesis following cerebral infarction affecting left dominant side: Secondary | ICD-10-CM | POA: Diagnosis not present

## 2023-09-12 DIAGNOSIS — J449 Chronic obstructive pulmonary disease, unspecified: Secondary | ICD-10-CM | POA: Diagnosis not present

## 2023-09-12 DIAGNOSIS — J9691 Respiratory failure, unspecified with hypoxia: Secondary | ICD-10-CM | POA: Diagnosis not present

## 2023-09-12 DIAGNOSIS — R1312 Dysphagia, oropharyngeal phase: Secondary | ICD-10-CM | POA: Diagnosis not present

## 2023-09-18 DIAGNOSIS — I69352 Hemiplegia and hemiparesis following cerebral infarction affecting left dominant side: Secondary | ICD-10-CM | POA: Diagnosis not present

## 2023-09-18 DIAGNOSIS — G40909 Epilepsy, unspecified, not intractable, without status epilepticus: Secondary | ICD-10-CM | POA: Diagnosis not present

## 2023-09-18 DIAGNOSIS — J9691 Respiratory failure, unspecified with hypoxia: Secondary | ICD-10-CM | POA: Diagnosis not present

## 2023-09-18 DIAGNOSIS — R1312 Dysphagia, oropharyngeal phase: Secondary | ICD-10-CM | POA: Diagnosis not present

## 2023-09-18 DIAGNOSIS — M48061 Spinal stenosis, lumbar region without neurogenic claudication: Secondary | ICD-10-CM | POA: Diagnosis not present

## 2023-09-18 DIAGNOSIS — J449 Chronic obstructive pulmonary disease, unspecified: Secondary | ICD-10-CM | POA: Diagnosis not present

## 2023-09-20 DIAGNOSIS — J449 Chronic obstructive pulmonary disease, unspecified: Secondary | ICD-10-CM | POA: Diagnosis not present

## 2023-09-20 DIAGNOSIS — J9691 Respiratory failure, unspecified with hypoxia: Secondary | ICD-10-CM | POA: Diagnosis not present

## 2023-09-20 DIAGNOSIS — G40909 Epilepsy, unspecified, not intractable, without status epilepticus: Secondary | ICD-10-CM | POA: Diagnosis not present

## 2023-09-20 DIAGNOSIS — R1312 Dysphagia, oropharyngeal phase: Secondary | ICD-10-CM | POA: Diagnosis not present

## 2023-09-20 DIAGNOSIS — I69352 Hemiplegia and hemiparesis following cerebral infarction affecting left dominant side: Secondary | ICD-10-CM | POA: Diagnosis not present

## 2023-09-20 DIAGNOSIS — M48061 Spinal stenosis, lumbar region without neurogenic claudication: Secondary | ICD-10-CM | POA: Diagnosis not present

## 2023-10-02 DIAGNOSIS — M6281 Muscle weakness (generalized): Secondary | ICD-10-CM | POA: Diagnosis not present

## 2023-10-02 DIAGNOSIS — M48061 Spinal stenosis, lumbar region without neurogenic claudication: Secondary | ICD-10-CM | POA: Diagnosis not present

## 2023-10-02 DIAGNOSIS — G40909 Epilepsy, unspecified, not intractable, without status epilepticus: Secondary | ICD-10-CM | POA: Diagnosis not present

## 2023-10-02 DIAGNOSIS — Z741 Need for assistance with personal care: Secondary | ICD-10-CM | POA: Diagnosis not present

## 2023-10-02 DIAGNOSIS — J9691 Respiratory failure, unspecified with hypoxia: Secondary | ICD-10-CM | POA: Diagnosis not present

## 2023-10-02 DIAGNOSIS — I69352 Hemiplegia and hemiparesis following cerebral infarction affecting left dominant side: Secondary | ICD-10-CM | POA: Diagnosis not present

## 2023-10-02 DIAGNOSIS — R1312 Dysphagia, oropharyngeal phase: Secondary | ICD-10-CM | POA: Diagnosis not present

## 2023-10-02 DIAGNOSIS — J449 Chronic obstructive pulmonary disease, unspecified: Secondary | ICD-10-CM | POA: Diagnosis not present

## 2023-10-03 DIAGNOSIS — G40909 Epilepsy, unspecified, not intractable, without status epilepticus: Secondary | ICD-10-CM | POA: Diagnosis not present

## 2023-10-03 DIAGNOSIS — J9691 Respiratory failure, unspecified with hypoxia: Secondary | ICD-10-CM | POA: Diagnosis not present

## 2023-10-03 DIAGNOSIS — R1312 Dysphagia, oropharyngeal phase: Secondary | ICD-10-CM | POA: Diagnosis not present

## 2023-10-03 DIAGNOSIS — J449 Chronic obstructive pulmonary disease, unspecified: Secondary | ICD-10-CM | POA: Diagnosis not present

## 2023-10-03 DIAGNOSIS — M48061 Spinal stenosis, lumbar region without neurogenic claudication: Secondary | ICD-10-CM | POA: Diagnosis not present

## 2023-10-03 DIAGNOSIS — I69352 Hemiplegia and hemiparesis following cerebral infarction affecting left dominant side: Secondary | ICD-10-CM | POA: Diagnosis not present

## 2023-10-04 DIAGNOSIS — M48061 Spinal stenosis, lumbar region without neurogenic claudication: Secondary | ICD-10-CM | POA: Diagnosis not present

## 2023-10-04 DIAGNOSIS — G40909 Epilepsy, unspecified, not intractable, without status epilepticus: Secondary | ICD-10-CM | POA: Diagnosis not present

## 2023-10-04 DIAGNOSIS — I69352 Hemiplegia and hemiparesis following cerebral infarction affecting left dominant side: Secondary | ICD-10-CM | POA: Diagnosis not present

## 2023-10-04 DIAGNOSIS — R1312 Dysphagia, oropharyngeal phase: Secondary | ICD-10-CM | POA: Diagnosis not present

## 2023-10-04 DIAGNOSIS — J449 Chronic obstructive pulmonary disease, unspecified: Secondary | ICD-10-CM | POA: Diagnosis not present

## 2023-10-04 DIAGNOSIS — J9691 Respiratory failure, unspecified with hypoxia: Secondary | ICD-10-CM | POA: Diagnosis not present

## 2023-10-08 ENCOUNTER — Encounter: Payer: Self-pay | Admitting: Nurse Practitioner

## 2023-10-08 ENCOUNTER — Non-Acute Institutional Stay: Payer: Self-pay | Admitting: Nurse Practitioner

## 2023-10-08 DIAGNOSIS — J449 Chronic obstructive pulmonary disease, unspecified: Secondary | ICD-10-CM | POA: Diagnosis not present

## 2023-10-08 DIAGNOSIS — I69352 Hemiplegia and hemiparesis following cerebral infarction affecting left dominant side: Secondary | ICD-10-CM | POA: Diagnosis not present

## 2023-10-08 DIAGNOSIS — G40909 Epilepsy, unspecified, not intractable, without status epilepticus: Secondary | ICD-10-CM | POA: Diagnosis not present

## 2023-10-08 DIAGNOSIS — J9691 Respiratory failure, unspecified with hypoxia: Secondary | ICD-10-CM | POA: Diagnosis not present

## 2023-10-08 DIAGNOSIS — R1312 Dysphagia, oropharyngeal phase: Secondary | ICD-10-CM | POA: Diagnosis not present

## 2023-10-08 DIAGNOSIS — Z0000 Encounter for general adult medical examination without abnormal findings: Secondary | ICD-10-CM | POA: Diagnosis not present

## 2023-10-08 DIAGNOSIS — M48061 Spinal stenosis, lumbar region without neurogenic claudication: Secondary | ICD-10-CM | POA: Diagnosis not present

## 2023-10-08 NOTE — Progress Notes (Signed)
 Subjective:   Cuma M Poppell is a 78 y.o. female who presents for Medicare Annual (Subsequent) preventive examination.  Visit Complete: [facility]    Cardiac Risk Factors include: advanced age (>67men, >59 women);hypertension;sedentary lifestyle     Objective:    Today's Vitals   10/08/23 1417  BP: 118/66  Pulse: 63  Resp: 17  Temp: 97.9 F (36.6 C)  SpO2: 98%  Weight: 142 lb 6.4 oz (64.6 kg)  Height: 5\' 5"  (1.651 m)   Body mass index is 23.7 kg/m.     10/08/2023    2:20 PM 06/12/2023   10:14 AM 05/02/2023   11:18 AM 03/11/2023   10:28 AM 01/01/2023   12:02 PM 11/14/2022   10:18 AM 09/25/2022    2:46 PM  Advanced Directives  Does Patient Have a Medical Advance Directive? Yes Yes Yes Yes Yes Yes Yes  Type of Advance Directive Out of facility DNR (pink MOST or yellow form) Out of facility DNR (pink MOST or yellow form) Out of facility DNR (pink MOST or yellow form) Out of facility DNR (pink MOST or yellow form) Out of facility DNR (pink MOST or yellow form) Out of facility DNR (pink MOST or yellow form) Out of facility DNR (pink MOST or yellow form)  Does patient want to make changes to medical advance directive? No - Patient declined No - Patient declined No - Patient declined No - Patient declined No - Patient declined No - Patient declined No - Patient declined  Pre-existing out of facility DNR order (yellow form or pink MOST form)    Yellow form placed in chart (order not valid for inpatient use)   Yellow form placed in chart (order not valid for inpatient use)    Current Medications (verified) Outpatient Encounter Medications as of 10/08/2023  Medication Sig   aspirin EC 81 MG EC tablet Take 1 tablet (81 mg total) by mouth daily.   Cholecalciferol (D3-1000) 25 MCG (1000 UT) tablet Take 1,000 Units by mouth daily.   Infant Care Products [facility]) OINT Apply to sacrum, Coccyx,buttocks topically as needed   Multiple Vitamin (MULTIVITAMIN) tablet Take 1 tablet by  mouth at bedtime.   nystatin cream (MYCOSTATIN) Apply 1 Application topically daily as needed for dry skin.   Petrolatum (PETROLEUM JELLY) OINT Apply 1 application  topically daily as needed (to left foot).   vitamin B-12 (CYANOCOBALAMIN) 500 MCG tablet Take 1,000 mcg by mouth daily.   No facility-administered encounter medications on file as of 10/08/2023.    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   Disorder of kidney 12/28/2014   Gait instability 04/18/2015   Osteoarthritis 03/28/2015   Presbyesophagus 04/16/2016   Per MBS 04/2016   Schizophrenia (HCC) 03/28/2015   Scoliosis 12/28/2014   Spinal stenosis 12/28/2014   Past Surgical History:  Procedure Laterality Date   HIP FRACTURE SURGERY Left    Family History  Problem Relation Age of Onset   Gout Father    Social History   Socioeconomic History   Marital status: Divorced    Spouse name: Not on file   Number of children: Not on  file   Years of education: Not on file   Highest education level: Not on file  Occupational History   Not on file  Tobacco Use   Smoking status: Former    Current packs/day: 1.00    Average packs/day: 1 pack/day for 30.0 years (30.0 ttl pk-yrs)    Types: Cigarettes   Smokeless tobacco: Never  Vaping Use   Vaping status: Never Used  Substance and Sexual Activity   Alcohol use: No   Drug use: No   Sexual activity: Not on file  Other Topics Concern   Not on file  Social History Narrative   Not on file   Social Drivers of Health   Financial Resource Strain: Not on file  Food Insecurity: Not  on file  Transportation Needs: Not on file  Physical Activity: Not on file  Stress: Not on file  Social Connections: Not on file    Tobacco Counseling Counseling given: Not Answered   Clinical Intake:  Pre-visit preparation completed: Yes        BMI - recorded: 22 Nutritional Status: BMI of 19-24  Normal Nutritional Risks: None  How often do you need to have someone help you when you read instructions, pamphlets, or other written materials from your doctor or pharmacy?: 5 - Always         Activities of Daily Living    10/08/2023    1:17 PM  In your present state of health, do you have any difficulty performing the following activities:  Hearing? 1  Vision? 1  Difficulty concentrating or making decisions? 1  Walking or climbing stairs? 1  Dressing or bathing? 1  Doing errands, shopping? 1  Preparing Food and eating ? Y  Using the Toilet? Y  In the past six months, have you accidently leaked urine? Y  Do you have problems with loss of bowel control? Y  Managing your Medications? Y  Managing your Finances? Y  Housekeeping or managing your Housekeeping? Y    Patient Care Team: Earnestine Mealing, MD as PCP - General (Family Medicine) Lamar Blinks, MD as Consulting Physician (Cardiology) [facility]  Indicate any recent Medical Services you may have received from other than [facility] providers in the past year (date may be approximate).     Assessment:   This is a routine wellness examination for [place].  Hearing/Vision screen No results found.   Goals Addressed   None    Depression Screen    10/08/2023    1:18 PM 10/23/2016    3:11 PM  PHQ 2/9 Scores  PHQ - 2 Score  1  Exception Documentation Other- indicate reason in comment box     Fall Risk    10/08/2023    1:18 PM 01/29/2019   12:23 PM 10/23/2016    3:11 PM  Fall Risk   Falls in the past year? 0 -- Yes  Comment  Emmi Telephone Survey: data to providers prior to load   Number falls  in past yr:  -- 2 or more  Comment  Emmi Telephone Survey Actual Response =    Risk Factor Category    High Fall Risk  Risk for fall due to :   Impaired mobility  Follow up   Falls prevention discussed    MEDICARE RISK AT HOME: Medicare Risk at Home Any stairs in or around the home?: No Home free of loose throw rugs in walkways, pet beds, electrical cords, etc?: Yes Adequate lighting in your home to  reduce risk of falls?: Yes Life alert?: No Use of a cane, walker or w/c?: Yes Grab bars in the bathroom?: Yes Shower chair or bench in shower?: Yes Elevated toilet seat or a handicapped toilet?: Yes  TIMED UP AND GO:  Was the test performed?  No    Cognitive Function:        10/23/2016    3:17 PM  6CIT Screen  What Year? 0 points  What month? 0 points  What time? 0 points  Count back from 20 0 points  Months in reverse 0 points  Repeat phrase 4 points  Total Score 4 points    Immunizations Immunization History  Administered Date(s) Administered   Influenza Split 04/18/2011   Influenza,inj,Quad PF,6+ Mos 07/22/2015   Moderna SARS-COV2 Booster Vaccination 11/18/2020   Moderna Sars-Covid-2 Vaccination 07/13/2019, 08/10/2019, 05/13/2020   PNEUMOCOCCAL CONJUGATE-20 09/26/2022   Pneumococcal Conjugate-13 07/22/2015   Tdap 04/18/2011    Declined tdap  Flu Vaccine status: Declined, Education has been provided regarding the importance of this vaccine but patient still declined. Advised may receive this vaccine at local pharmacy or Health Dept. Aware to provide a copy of the vaccination record if obtained from local pharmacy or Health Dept. Verbalized acceptance and understanding.  Pneumococcal vaccine status: Up to date  Covid-19 vaccine status: Declined, Education has been provided regarding the importance of this vaccine but patient still declined. Advised may receive this vaccine at local pharmacy or Health Dept.or vaccine clinic. Aware to provide a copy of the vaccination  record if obtained from local pharmacy or Health Dept. Verbalized acceptance and understanding.  Qualifies for Shingles Vaccine? Yes   Zostavax completed No   Shingrix Completed?: No.    Education has been provided regarding the importance of this vaccine. Patient has been advised to call insurance company to determine out of pocket expense if they have not yet received this vaccine. Advised may also receive vaccine at local pharmacy or Health Dept. Verbalized acceptance and understanding.  Screening Tests Health Maintenance  Topic Date Due   Hepatitis C Screening  Never done   Zoster Vaccines- Shingrix (1 of 2) Never done   INFLUENZA VACCINE  01/31/2024   Medicare Annual Wellness (AWV)  10/07/2024   Pneumonia Vaccine 20+ Years old  Completed   DEXA SCAN  Completed   HPV VACCINES  Aged Out   Lung Cancer Screening  Discontinued   DTaP/Tdap/Td  Discontinued   Colonoscopy  Discontinued   COVID-19 Vaccine  Discontinued    Health Maintenance  Health Maintenance Due  Topic Date Due   Hepatitis C Screening  Never done   Zoster Vaccines- Shingrix (1 of 2) Never done    Colorectal cancer screening: No longer required.   Mammogram status: No longer required due to age.   Lung Cancer Screening: (Low Dose CT Chest recommended if Age 28-80 years, 20 pack-year currently smoking OR have quit w/in 15years.) does not qualify.   Lung Cancer Screening Referral: na  Additional Screening:  Hepatitis C Screening: does qualify; Completed  Vision Screening: Recommended annual ophthalmology exams for early detection of glaucoma and other disorders of the eye. Is the patient up to date with their annual eye exam?  No  Unable to complete eye exam due to dementia   Dental Screening: Recommended annual dental exams for proper oral hygiene  Community Resource Referral / Chronic Care Management: CRR required this visit?  No   CCM required this visit?  No     Plan:  I have personally  reviewed and noted the following in the patient's chart:   Medical and social history Use of alcohol, tobacco or illicit drugs  Current medications and supplements including opioid prescriptions. Patient is not currently taking opioid prescriptions. Functional ability and status Nutritional status Physical activity Advanced directives List of other physicians Hospitalizations, surgeries, and ER visits in previous 12 months Vitals Screenings to include cognitive, depression, and falls Referrals and appointments  In addition, I have reviewed and discussed with patient certain preventive protocols, quality metrics, and best practice recommendations. A written personalized care plan for preventive services as well as general preventive health recommendations were provided to patient.     Sharon Seller, NP   10/08/2023

## 2023-10-08 NOTE — Patient Instructions (Signed)
  Diana Mccoy , Thank you for taking time to come for your Medicare Wellness Visit. I appreciate your ongoing commitment to your health goals. Please review the following plan we discussed and let me know if I can assist you in the future.     This is a list of the screening recommended for you and due dates:  Health Maintenance  Topic Date Due   Hepatitis C Screening  Never done   Zoster (Shingles) Vaccine (1 of 2) Never done   Flu Shot  01/31/2024   Medicare Annual Wellness Visit  10/07/2024   Pneumonia Vaccine  Completed   DEXA scan (bone density measurement)  Completed   HPV Vaccine  Aged Out   Screening for Lung Cancer  Discontinued   DTaP/Tdap/Td vaccine  Discontinued   Colon Cancer Screening  Discontinued   COVID-19 Vaccine  Discontinued

## 2023-10-11 ENCOUNTER — Encounter: Payer: Self-pay | Admitting: Student

## 2023-10-11 ENCOUNTER — Non-Acute Institutional Stay (SKILLED_NURSING_FACILITY): Payer: Self-pay | Admitting: Student

## 2023-10-11 DIAGNOSIS — G40909 Epilepsy, unspecified, not intractable, without status epilepticus: Secondary | ICD-10-CM

## 2023-10-11 DIAGNOSIS — I69398 Other sequelae of cerebral infarction: Secondary | ICD-10-CM | POA: Diagnosis not present

## 2023-10-11 DIAGNOSIS — J449 Chronic obstructive pulmonary disease, unspecified: Secondary | ICD-10-CM | POA: Diagnosis not present

## 2023-10-11 DIAGNOSIS — I69354 Hemiplegia and hemiparesis following cerebral infarction affecting left non-dominant side: Secondary | ICD-10-CM | POA: Diagnosis not present

## 2023-10-11 DIAGNOSIS — F03C3 Unspecified dementia, severe, with mood disturbance: Secondary | ICD-10-CM

## 2023-10-11 NOTE — Progress Notes (Signed)
 Location:  Other [facility].  Nursing Home Room Number: [facility] [address] Place of Service:  SNF [phone]) Provider:  Valrie Gehrig, MD  Patient Care Team: Valrie Gehrig, MD as PCP - General (Family Medicine) Michelle Aid, MD as Consulting Physician (Cardiology) [facility]  Extended Emergency Contact Information Primary Emergency Contact: Rossie Coon Address: [address]          [place], [place] [address] [place] Home Phone: [phone] Relation: Daughter  Code Status:  DNR Goals of care: Advanced Directive information    10/08/2023    2:20 PM  Advanced Directives  Does Patient Have a Medical Advance Directive? Yes  Type of Advance Directive Out of facility DNR (pink MOST or yellow form)  Does patient want to make changes to medical advance directive? No - Patient declined     Chief Complaint  Patient presents with   Medical Management of Chronic Issues    Medical Management of Chronic Issues.     HPI:  Pt is a 78 y.o. female seen today for medical management of chronic diseases.    Patient is lying in bed, no acute distress.   She is nonverbal.  Patient has had weight changes - increasing and decreasing periodically over the last few months. Net loss 1lb in 1 year.   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   Cholecalciferol (D3-1000) 25 MCG (1000 UT) tablet Take 1,000 Units by mouth daily.   Infant Care Products [facility]) OINT Apply to sacrum, Coccyx,buttocks topically as needed   Multiple Vitamin (MULTIVITAMIN) tablet Take 1 tablet by mouth at bedtime.   nystatin cream (MYCOSTATIN) Apply 1 Application topically daily as needed for dry skin.   Petrolatum (PETROLEUM JELLY) OINT Apply 1 application  topically daily as needed (to left foot).   vitamin B-12 (CYANOCOBALAMIN) 500 MCG tablet Take 1,000 mcg by mouth daily.   No facility-administered encounter medications on file as of 10/11/2023.    Review of Systems  Immunization History  Administered Date(s) Administered   Influenza Split 04/18/2011   Influenza,inj,Quad PF,6+ Mos 07/22/2015   Moderna SARS-COV2 Booster Vaccination 11/18/2020   Moderna Sars-Covid-2 Vaccination 07/13/2019, 08/10/2019, 05/13/2020   PNEUMOCOCCAL CONJUGATE-20 09/26/2022   Pneumococcal Conjugate-13 07/22/2015   Tdap 04/18/2011   Pertinent  Health Maintenance Due  Topic Date Due   INFLUENZA VACCINE  01/31/2024   DEXA SCAN  Completed   Colonoscopy  Discontinued      10/23/2016    3:11 PM 01/29/2019   12:23 PM 10/08/2023  1:18 PM  Fall Risk  Falls in the past year? Yes -- 0  Number of falls in past year - Comments  Emmi Telephone Survey Actual Response =    Patient at Risk for Falls Due to Impaired mobility    Fall risk Follow up Falls prevention discussed     Functional Status Survey:    Vitals:   10/11/23 0927  BP: 118/66  Pulse: 63  Resp: 17  Temp: 97.9 F (36.6 C)  SpO2: 98%  Weight: 142 lb 6.4 oz (64.6 kg)  Height: 5\' 5"  (1.651 m)   Body mass index is  23.7 kg/m. Physical Exam Cardiovascular:     Comments: Appears well-perfused Pulmonary:     Effort: Pulmonary effort is normal.  Abdominal:     General: Abdomen is flat.  Neurological:     Mental Status: She is alert. Mental status is at baseline.     Labs reviewed: Recent Labs    08/08/23 0000  NA 144  K 4.2  CL 102  CO2 34*  BUN 19  CREATININE 0.5  CALCIUM 8.9   Recent Labs    08/08/23 0000  AST 16  ALT 10  ALKPHOS 104  ALBUMIN 3.6   Recent Labs    08/08/23 0000  WBC 11.6  NEUTROABS 6,960.00  HGB 14.3  HCT 44  PLT 321   Lab Results  Component Value Date   TSH 2.31 08/08/2023   Lab Results  Component Value Date   HGBA1C 5.7 (H) 03/06/2017   No results found for: "CHOL", "HDL", "LDLCALC", "LDLDIRECT", "TRIG", "CHOLHDL"  Significant Diagnostic Results in last 30 days:  No results found.  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 Weight stable at this time, continue protein supplementation. Receives ROM exercises. No signs of wounds at this time. Seizure-free at this time. No medications, continue to monitor for signs. Breathing well without medications at this time. Continue GOC and supportive care. She is followed by palliative care as well.   Family/ staff Communication: nursing  Labs/tests ordered:  none

## 2023-10-12 ENCOUNTER — Encounter: Payer: Self-pay | Admitting: Student

## 2023-10-15 DIAGNOSIS — G40909 Epilepsy, unspecified, not intractable, without status epilepticus: Secondary | ICD-10-CM | POA: Diagnosis not present

## 2023-10-15 DIAGNOSIS — I69352 Hemiplegia and hemiparesis following cerebral infarction affecting left dominant side: Secondary | ICD-10-CM | POA: Diagnosis not present

## 2023-10-15 DIAGNOSIS — J9691 Respiratory failure, unspecified with hypoxia: Secondary | ICD-10-CM | POA: Diagnosis not present

## 2023-10-15 DIAGNOSIS — M48061 Spinal stenosis, lumbar region without neurogenic claudication: Secondary | ICD-10-CM | POA: Diagnosis not present

## 2023-10-15 DIAGNOSIS — R1312 Dysphagia, oropharyngeal phase: Secondary | ICD-10-CM | POA: Diagnosis not present

## 2023-10-15 DIAGNOSIS — J449 Chronic obstructive pulmonary disease, unspecified: Secondary | ICD-10-CM | POA: Diagnosis not present

## 2023-10-29 DIAGNOSIS — J9691 Respiratory failure, unspecified with hypoxia: Secondary | ICD-10-CM | POA: Diagnosis not present

## 2023-10-29 DIAGNOSIS — I69352 Hemiplegia and hemiparesis following cerebral infarction affecting left dominant side: Secondary | ICD-10-CM | POA: Diagnosis not present

## 2023-10-29 DIAGNOSIS — M48061 Spinal stenosis, lumbar region without neurogenic claudication: Secondary | ICD-10-CM | POA: Diagnosis not present

## 2023-10-29 DIAGNOSIS — J449 Chronic obstructive pulmonary disease, unspecified: Secondary | ICD-10-CM | POA: Diagnosis not present

## 2023-10-29 DIAGNOSIS — R1312 Dysphagia, oropharyngeal phase: Secondary | ICD-10-CM | POA: Diagnosis not present

## 2023-10-29 DIAGNOSIS — G40909 Epilepsy, unspecified, not intractable, without status epilepticus: Secondary | ICD-10-CM | POA: Diagnosis not present

## 2023-11-06 DIAGNOSIS — R634 Abnormal weight loss: Secondary | ICD-10-CM | POA: Diagnosis not present

## 2023-11-06 DIAGNOSIS — Z515 Encounter for palliative care: Secondary | ICD-10-CM | POA: Diagnosis not present

## 2023-11-06 DIAGNOSIS — I69352 Hemiplegia and hemiparesis following cerebral infarction affecting left dominant side: Secondary | ICD-10-CM | POA: Diagnosis not present

## 2023-11-06 DIAGNOSIS — M6281 Muscle weakness (generalized): Secondary | ICD-10-CM | POA: Diagnosis not present

## 2023-11-06 DIAGNOSIS — R54 Age-related physical debility: Secondary | ICD-10-CM | POA: Diagnosis not present

## 2023-11-06 DIAGNOSIS — L97421 Non-pressure chronic ulcer of left heel and midfoot limited to breakdown of skin: Secondary | ICD-10-CM | POA: Diagnosis not present

## 2023-11-06 DIAGNOSIS — I69921 Dysphasia following unspecified cerebrovascular disease: Secondary | ICD-10-CM | POA: Diagnosis not present

## 2023-11-06 DIAGNOSIS — M1990 Unspecified osteoarthritis, unspecified site: Secondary | ICD-10-CM | POA: Diagnosis not present

## 2023-11-13 ENCOUNTER — Non-Acute Institutional Stay (SKILLED_NURSING_FACILITY): Payer: Self-pay | Admitting: Student

## 2023-11-13 ENCOUNTER — Encounter: Payer: Self-pay | Admitting: Student

## 2023-11-13 DIAGNOSIS — J449 Chronic obstructive pulmonary disease, unspecified: Secondary | ICD-10-CM | POA: Diagnosis not present

## 2023-11-13 DIAGNOSIS — F03C3 Unspecified dementia, severe, with mood disturbance: Secondary | ICD-10-CM | POA: Diagnosis not present

## 2023-11-13 DIAGNOSIS — I69354 Hemiplegia and hemiparesis following cerebral infarction affecting left non-dominant side: Secondary | ICD-10-CM | POA: Diagnosis not present

## 2023-11-13 DIAGNOSIS — F2089 Other schizophrenia: Secondary | ICD-10-CM

## 2023-11-13 NOTE — Progress Notes (Unsigned)
 Location:  Other [facility].  Nursing Home Room Number: [facility] [address] Place of Service:  SNF [phone]) Provider:  Valrie Gehrig, MD  Patient Care Team: Valrie Gehrig, MD as PCP - General (Family Medicine) Michelle Aid, MD as Consulting Physician (Cardiology) [facility]  Extended Emergency Contact Information Primary Emergency Contact: Rossie Coon Address: [address]          [place], [place] [address] [place] Home Phone: [phone] Relation: Daughter  Code Status:  DNR Goals of care: Advanced Directive information    10/08/2023    2:20 PM  Advanced Directives  Does Patient Have a Medical Advance Directive? Yes  Type of Advance Directive Out of facility DNR (pink MOST or yellow form)  Does patient want to make changes to medical advance directive? No - Patient declined     Chief Complaint  Patient presents with   Medical Management of Chronic Issues    Medical Management of Chronic Issues.     HPI:  Pt is a 78 y.o. female seen today for medical management of chronic diseases.   Received concern from nursing staff that patient has poor PO intake, but has also had downgraded diet. Of note, patient has been on and off of hospice level of care for many years. Attempted to contact family for GOC, left voicemail.   Patient requires total care however she does feed herself. She is nonverbal. Wheelchair dependent and primarily stays in bed by choice.   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   Cholecalciferol (D3-1000) 25 MCG (1000 UT) tablet Take 1,000 Units by mouth daily.   Infant Care Products [facility]) OINT Apply to sacrum, Coccyx,buttocks topically as needed   Multiple Vitamin (MULTIVITAMIN) tablet Take 1 tablet by mouth at bedtime.   nystatin cream (MYCOSTATIN) Apply 1 Application topically daily as needed for dry skin.   Petrolatum (PETROLEUM JELLY) OINT Apply 1 application  topically daily as needed (to left foot).   vitamin B-12 (CYANOCOBALAMIN) 500 MCG tablet Take 1,000 mcg by mouth daily.   No facility-administered encounter medications on file as of 11/13/2023.    Review of Systems  Immunization History  Administered Date(s) Administered   Influenza Split 04/18/2011   Influenza,inj,Quad PF,6+ Mos 07/22/2015   Moderna SARS-COV2 Booster Vaccination 11/18/2020   Moderna Sars-Covid-2 Vaccination 07/13/2019, 08/10/2019, 05/13/2020   PNEUMOCOCCAL CONJUGATE-20 09/26/2022   Pneumococcal Conjugate-13 07/22/2015   Tdap 04/18/2011   Pertinent  Health Maintenance Due  Topic Date Due   INFLUENZA VACCINE  01/31/2024  DEXA SCAN  Completed   Colonoscopy  Discontinued      10/23/2016    3:11 PM 01/29/2019   12:23 PM 10/08/2023    1:18 PM  Fall Risk  Falls in the past year? Yes -- 0  Number of falls in past year - Comments  Emmi Telephone Survey Actual Response =    Patient at Risk for Falls Due to Impaired mobility    Fall risk Follow up Falls prevention discussed     Functional Status Survey:    Vitals:    11/13/23 0957  BP: 135/75  Pulse: 96  Resp: (!) 21  Temp: (!) 97.5 F (36.4 C)  SpO2: 93%  Weight: 150 lb (68 kg)  Height: 5\' 5"  (1.651 m)   Body mass index is 24.96 kg/m. Physical Exam Constitutional:      Comments: Lying in bed  Pulmonary:     Effort: Pulmonary effort is normal.  Neurological:     Mental Status: She is alert. Mental status is at baseline.     Labs reviewed: Recent Labs    08/08/23 0000  NA 144  K 4.2  CL 102  CO2 34*  BUN 19  CREATININE 0.5  CALCIUM 8.9   Recent Labs    08/08/23 0000  AST 16  ALT 10  ALKPHOS 104  ALBUMIN 3.6   Recent Labs    08/08/23 0000  WBC 11.6  NEUTROABS 6,960.00  HGB 14.3  HCT 44  PLT 321   Lab Results  Component Value Date   TSH 2.31 08/08/2023   Lab Results  Component Value Date   HGBA1C 5.7 (H) 03/06/2017   No results found for: "CHOL", "HDL", "LDLCALC", "LDLDIRECT", "TRIG", "CHOLHDL"  Significant Diagnostic Results in last 30 days:  No results found.  Assessment/Plan Hemiparesis affecting left side as late effect of cerebrovascular accident (CVA) (HCC)  Severe dementia with mood disturbance, unspecified dementia type (HCC)  Other schizophrenia (HCC)  Chronic obstructive pulmonary disease, unspecified COPD type (HCC) Patient with hx of stroke with severe deficits in function. She has a puree diet based on aspiration risk, however, is not eating food well presumably due to textures. Patient requires total assistance and is non-verbal. Breaths comfortably on room air. Would like to discuss with family risks and benefits of liberalizing diet in hopes of maintaining patient's quality of life despite her current situation.   Family/ staff Communication: nursing  Labs/tests ordered:  none

## 2023-11-20 ENCOUNTER — Telehealth: Payer: Self-pay | Admitting: Student

## 2023-11-20 NOTE — Telephone Encounter (Addendum)
 Contacted patient's daughter, Kristine, to discuss concern that patient is not eating pureed diet well. She states she would prefer the patient maintains a liberalized diet rather than restrictive regardless of aspiration risk. Will plan to notify nursing staff. Their paid attendant to be present monitor for aspiration.

## 2023-12-15 DIAGNOSIS — R0989 Other specified symptoms and signs involving the circulatory and respiratory systems: Secondary | ICD-10-CM | POA: Diagnosis not present

## 2023-12-16 ENCOUNTER — Encounter: Payer: Self-pay | Admitting: Student

## 2023-12-16 ENCOUNTER — Non-Acute Institutional Stay (SKILLED_NURSING_FACILITY): Payer: Self-pay | Admitting: Student

## 2023-12-16 DIAGNOSIS — I69354 Hemiplegia and hemiparesis following cerebral infarction affecting left non-dominant side: Secondary | ICD-10-CM

## 2023-12-16 DIAGNOSIS — T17908A Unspecified foreign body in respiratory tract, part unspecified causing other injury, initial encounter: Secondary | ICD-10-CM | POA: Insufficient documentation

## 2023-12-16 DIAGNOSIS — F03C3 Unspecified dementia, severe, with mood disturbance: Secondary | ICD-10-CM

## 2023-12-16 DIAGNOSIS — T17908D Unspecified foreign body in respiratory tract, part unspecified causing other injury, subsequent encounter: Secondary | ICD-10-CM | POA: Diagnosis not present

## 2023-12-16 HISTORY — DX: Unspecified foreign body in respiratory tract, part unspecified causing other injury, initial encounter: T17.908A

## 2023-12-16 NOTE — Progress Notes (Signed)
 Location:  Other Nursing Home Room Number: [facility] - [address] Place of Service:  SNF [phone]) Provider:  Alver Jobs, Lisa Rideau, MD  Patient Care Team: Valrie Gehrig, MD as PCP - General (Family Medicine) Michelle Aid, MD as Consulting Physician (Cardiology) [facility]  Extended Emergency Contact Information Primary Emergency Contact: Rossie Coon Address: [address]          [place], [place] [address] [place] Home Phone: [phone] Relation: Daughter  Code Status:  DNR Goals of care: Advanced Directive information    10/08/2023    2:20 PM  Advanced Directives  Does Patient Have a Medical Advance Directive? Yes  Type of Advance Directive Out of facility DNR (pink MOST or yellow form)  Does patient want to make changes to medical advance directive? No - Patient declined     Chief Complaint  Patient presents with  . Aspiration event    HPI:  Pt is a 78 y.o. female seen today for an acute visit for aspiration Discussed the use of AI scribe software for clinical note transcription with the patient, who gave verbal consent to proceed.  History of Present Illness   History of Present Illness The patient presents with a recent choking incident.  She experienced a significant choking incident over the weekend. The specific food item causing the choking is unknown.  She is on a regular diet with some restrictions, such as avoiding bacon and sugar. Her daughter mentioned that she typically consumes egg salad sandwiches and not fried chicken.     Past Medical History:  Diagnosis Date  . Acid reflux 12/28/2014  . 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 Anxiety 12/28/2014  . BP (high blood pressure) 12/28/2014  . CAFL (chronic airflow limitation) (HCC) 12/28/2014  . COPD (chronic obstructive pulmonary disease) (HCC) 01/19/2016  . Depression 03/28/2015  . 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 Disorder of kidney  12/28/2014  . Gait instability 04/18/2015  . Osteoarthritis 03/28/2015  . 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 Schizophrenia (HCC) 03/28/2015  . Scoliosis 12/28/2014  . Spinal stenosis 12/28/2014   Past Surgical History:  Procedure Laterality Date  . HIP FRACTURE SURGERY Left     Allergies  Allergen Reactions  . Ciprofloxacin Nausea And Vomiting  . Penicillins     rash, hives  . Sodium Thiosulfate   . Sulfa Antibiotics     Outpatient Encounter Medications as of 12/16/2023  Medication Sig  . aspirin  EC 81 MG EC tablet Take 1 tablet (81 mg total) by mouth daily.  . Cholecalciferol (D3-1000) 25 MCG (1000 UT) tablet Take 1,000 Units by mouth daily.  . Infant Care Products [facility]) OINT Apply to sacrum, Coccyx,buttocks topically as needed  . Multiple Vitamin (MULTIVITAMIN) tablet Take 1 tablet by mouth at bedtime.  Aaron Aas nystatin cream (MYCOSTATIN) Apply 1 Application topically daily as needed for dry skin.  Aaron Aas Petrolatum (PETROLEUM JELLY) OINT Apply 1 application  topically daily as needed (to left foot).  . vitamin B-12 (CYANOCOBALAMIN) 500 MCG tablet Take 1,000 mcg by mouth daily.   No facility-administered encounter medications on file as of 12/16/2023.    Review of Systems  Immunization History  Administered Date(s) Administered  . Influenza Split 04/18/2011  . Influenza,inj,Quad PF,6+ Mos 07/22/2015  . Moderna SARS-COV2 Booster Vaccination 11/18/2020  . Moderna Sars-Covid-2 Vaccination 07/13/2019, 08/10/2019, 05/13/2020  . PNEUMOCOCCAL CONJUGATE-20 09/26/2022  . Pneumococcal Conjugate-13 07/22/2015  . Tdap 04/18/2011   Pertinent  Health Maintenance Due  Topic Date Due  . INFLUENZA VACCINE  01/31/2024  . DEXA SCAN  Completed  . Colonoscopy  Discontinued      10/23/2016    3:11 PM 01/29/2019   12:23 PM 10/08/2023    1:18 PM 11/14/2023    4:39 PM  Fall Risk  Falls in the past year? Yes  --  0 0  Number of falls in past year - Comments  Emmi Telephone Survey  Actual Response =      Was there an injury with Fall?    0  Fall Risk Category Calculator    0  Patient at Risk for Falls Due to Impaired mobility      Fall risk Follow up Falls prevention discussed         Data saved with a previous flowsheet row definition   Functional Status Survey:    Vitals:   12/16/23 1932  BP: 135/74  Pulse: 95  Resp: 20  Temp: 97.6 F (36.4 C)  SpO2: 94%  Weight: 145 lb 1.6 oz (65.8 kg)   Body mass index is 24.15 kg/m. Physical Exam Pulmonary:     Effort: Pulmonary effort is normal.   Neurological:     Mental Status: She is alert. Mental status is at baseline.   Labs reviewed: Recent Labs    08/08/23 0000  NA 144  K 4.2  CL 102  CO2 34*  BUN 19  CREATININE 0.5  CALCIUM 8.9   Recent Labs    08/08/23 0000  AST 16  ALT 10  ALKPHOS 104  ALBUMIN 3.6   Recent Labs    08/08/23 0000  WBC 11.6  NEUTROABS 6,960.00  HGB 14.3  HCT 44  PLT 321   Lab Results  Component Value Date   TSH 2.31 08/08/2023   Lab Results  Component Value Date   HGBA1C 5.7 (H) 03/06/2017   No results found for: CHOL, HDL, LDLCALC, LDLDIRECT, TRIG, CHOLHDL  Significant Diagnostic Results in last 30 days:  No results found.  Assessment/Plan Assessment and Plan Choking incident She experienced a choking incident over the weekend. The specific food item causing the choking is unknown, but she is on a regular diet with some restrictions. The incident indicates a potential risk for future episodes. CXR Negative.  - Discuss dietary options with the family to prevent future choking incidents. - Consider dietary modifications such as a pureed diet if recommended by the family.   Family/ staff Communication: nursing, attempted to call patient's daughter HCPOA  Labs/tests ordered:

## 2023-12-17 DIAGNOSIS — R1312 Dysphagia, oropharyngeal phase: Secondary | ICD-10-CM | POA: Diagnosis not present

## 2023-12-19 DIAGNOSIS — R1312 Dysphagia, oropharyngeal phase: Secondary | ICD-10-CM | POA: Diagnosis not present

## 2023-12-30 DIAGNOSIS — R1312 Dysphagia, oropharyngeal phase: Secondary | ICD-10-CM | POA: Diagnosis not present

## 2024-01-01 DIAGNOSIS — R1312 Dysphagia, oropharyngeal phase: Secondary | ICD-10-CM | POA: Diagnosis not present

## 2024-01-08 DIAGNOSIS — R1312 Dysphagia, oropharyngeal phase: Secondary | ICD-10-CM | POA: Diagnosis not present

## 2024-01-09 ENCOUNTER — Encounter: Payer: Self-pay | Admitting: Nurse Practitioner

## 2024-01-09 ENCOUNTER — Non-Acute Institutional Stay (SKILLED_NURSING_FACILITY): Payer: Self-pay | Admitting: Nurse Practitioner

## 2024-01-09 DIAGNOSIS — F2089 Other schizophrenia: Secondary | ICD-10-CM | POA: Diagnosis not present

## 2024-01-09 DIAGNOSIS — F03C3 Unspecified dementia, severe, with mood disturbance: Secondary | ICD-10-CM

## 2024-01-09 DIAGNOSIS — I69354 Hemiplegia and hemiparesis following cerebral infarction affecting left non-dominant side: Secondary | ICD-10-CM

## 2024-01-09 DIAGNOSIS — R1312 Dysphagia, oropharyngeal phase: Secondary | ICD-10-CM | POA: Diagnosis not present

## 2024-01-09 DIAGNOSIS — M159 Polyosteoarthritis, unspecified: Secondary | ICD-10-CM

## 2024-01-09 NOTE — Assessment & Plan Note (Signed)
 Mood and behaviors stable at this time. Not on current medication, Will continue to monitor.

## 2024-01-09 NOTE — Assessment & Plan Note (Signed)
 Stable, dependant on staff for all ADLs. Continue supportive care.

## 2024-01-09 NOTE — Assessment & Plan Note (Signed)
 Stable, without signs of worsening pain.

## 2024-01-09 NOTE — Progress Notes (Signed)
 Location:  Other [facility].  Nursing Home Room Number: [facility] [id number] Place of Service:  SNF [phone]) Harlene An, NP  PCP: Abdul Fine, MD  Patient Care Team: Abdul Fine, MD as PCP - General (Family Medicine) Hester Wolm PARAS, MD as Consulting Physician (Cardiology) [facility]  Extended Emergency Contact Information Primary Emergency Contact: Stefan Rollo HERO Address: [address]          [place], [place] [address] [place] Home Phone: [phone] Relation: Daughter  Goals of care: Advanced Directive information    01/09/2024    9:38 AM  Advanced Directives  Does Patient Have a Medical Advance Directive? Yes  Type of Advance Directive Out of facility DNR (pink MOST or yellow form)  Does patient want to make changes to medical advance directive? No - Patient declined     Chief Complaint  Patient presents with   Medical Management of Chronic Issues    Medical Management of Chronic Issues.     HPI:  Pt is a 79 y.o. female seen today for medical management of chronic disease. Pt with advanced dementia requiring total care from staff.  She continues to have come behaviors noted but overall controlled  ST following to ensure safe swallowing and diet consistency. 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   Disorder of kidney 12/28/2014   Gait instability 04/18/2015   Osteoarthritis 03/28/2015   Presbyesophagus 04/16/2016   Per MBS 04/2016   Schizophrenia (HCC) 03/28/2015   Scoliosis 12/28/2014   Spinal stenosis 12/28/2014   Past Surgical History:  Procedure Laterality Date   HIP FRACTURE  SURGERY Left     Allergies  Allergen Reactions   Ciprofloxacin Nausea And Vomiting   Penicillins     rash, hives   Sodium Thiosulfate    Sulfa Antibiotics     Outpatient Encounter Medications as of 01/09/2024  Medication Sig   acetaminophen  (TYLENOL ) 650 MG CR tablet Take 650 mg by mouth every 4 (four) hours as needed for pain.   aspirin  EC 81 MG EC tablet Take 1 tablet (81 mg total) by mouth daily.   Cholecalciferol (D3-1000) 25 MCG (1000 UT) tablet Take 1,000 Units by mouth daily.   Infant Care Products [facility]) OINT Apply to sacrum, Coccyx,buttocks topically as needed   Multiple Vitamin (MULTIVITAMIN) tablet Take 1 tablet by mouth at bedtime.   nystatin cream (MYCOSTATIN) Apply 1 Application topically daily as needed for dry skin.   Petrolatum (PETROLEUM JELLY) OINT Apply 1 application  topically daily as needed (to left foot).   vitamin B-12 (CYANOCOBALAMIN) 500 MCG tablet Take 1,000 mcg by mouth daily.   No facility-administered encounter medications on file as of 01/09/2024.    Review of Systems  Unable to perform ROS: Dementia     Immunization History  Administered Date(s) Administered   Influenza Split 04/18/2011   Influenza,inj,Quad PF,6+ Mos 07/22/2015   Moderna SARS-COV2 Booster Vaccination 11/18/2020   Moderna Sars-Covid-2 Vaccination 07/13/2019, 08/10/2019, 05/13/2020   PNEUMOCOCCAL CONJUGATE-20 09/26/2022   Pneumococcal Conjugate-13 07/22/2015   Tdap 04/18/2011   Pertinent  Health Maintenance Due  Topic Date Due   INFLUENZA VACCINE  01/31/2024  DEXA SCAN  Completed   Colonoscopy  Discontinued      10/23/2016    3:11 PM 01/29/2019   12:23 PM 10/08/2023    1:18 PM 11/14/2023    4:39 PM  Fall Risk  Falls in the past year? Yes  --  0 0  Number of falls in past year - Comments  Emmi Telephone Survey Actual Response =      Was there an injury with Fall?    0  Fall Risk Category Calculator    0  Patient at Risk for Falls Due to Impaired mobility       Fall risk Follow up Falls prevention discussed         Data saved with a previous flowsheet row definition   Functional Status Survey:    Vitals:   01/09/24 0932  BP: 138/64  Pulse: 98  Resp: 16  Temp: 97.9 F (36.6 C)  SpO2: 95%  Weight: 140 lb 3.2 oz (63.6 kg)  Height: 5' 5 (1.651 m)   Body mass index is 23.33 kg/m. Physical Exam Constitutional:      General: She is not in acute distress.    Appearance: She is well-developed. She is not diaphoretic.  HENT:     Head: Normocephalic and atraumatic.     Mouth/Throat:     Pharynx: No oropharyngeal exudate.  Eyes:     Conjunctiva/sclera: Conjunctivae normal.     Pupils: Pupils are equal, round, and reactive to light.  Cardiovascular:     Rate and Rhythm: Normal rate and regular rhythm.     Heart sounds: Normal heart sounds.  Pulmonary:     Effort: Pulmonary effort is normal.     Breath sounds: Normal breath sounds.  Abdominal:     General: Bowel sounds are normal.     Palpations: Abdomen is soft.  Musculoskeletal:     Cervical back: Normal range of motion and neck supple.     Right lower leg: No edema.     Left lower leg: No edema.  Skin:    General: Skin is warm and dry.  Neurological:     Mental Status: She is alert. Mental status is at baseline.     Motor: Weakness present.     Gait: Gait abnormal.  Psychiatric:        Behavior: Behavior is uncooperative and combative (at time).     Labs reviewed: Recent Labs    08/08/23 0000  NA 144  K 4.2  CL 102  CO2 34*  BUN 19  CREATININE 0.5  CALCIUM 8.9   Recent Labs    08/08/23 0000  AST 16  ALT 10  ALKPHOS 104  ALBUMIN 3.6   Recent Labs    08/08/23 0000  WBC 11.6  NEUTROABS 6,960.00  HGB 14.3  HCT 44  PLT 321   Lab Results  Component Value Date   TSH 2.31 08/08/2023   Lab Results  Component Value Date   HGBA1C 5.7 (H) 03/06/2017   No results found for: CHOL, HDL, LDLCALC, LDLDIRECT, TRIG, CHOLHDL  Significant  Diagnostic Results in last 30 days:  No results found.  Assessment/Plan Hemiparesis affecting left side as late effect of cerebrovascular accident (CVA) (HCC) Stable, dependant on staff for all ADLs. Continue supportive care.   Osteoarthritis Stable, without signs of worsening pain.   Schizophrenia Mood and behaviors stable at this time. Not on current medication, Will continue to monitor.   Severe dementia with mood disturbance, unspecified dementia type (  HCC) Stable, no acute changes in cognitive or functional status, continue supportive care.      Alecxander Mainwaring K. Caro BODILY [facility] [phone]

## 2024-01-09 NOTE — Assessment & Plan Note (Signed)
 Stable, no acute changes in cognitive or functional status, continue supportive care.

## 2024-01-10 ENCOUNTER — Non-Acute Institutional Stay (SKILLED_NURSING_FACILITY): Payer: Self-pay | Admitting: Student

## 2024-01-10 ENCOUNTER — Encounter: Payer: Self-pay | Admitting: Student

## 2024-01-10 DIAGNOSIS — F3175 Bipolar disorder, in partial remission, most recent episode depressed: Secondary | ICD-10-CM | POA: Diagnosis not present

## 2024-01-10 DIAGNOSIS — L602 Onychogryphosis: Secondary | ICD-10-CM | POA: Diagnosis not present

## 2024-01-10 NOTE — Progress Notes (Unsigned)
 Location:  Other [facility].  Nursing Home Room Number: [facility] [address] Place of Service:  SNF [phone]) Provider:  Abdul Fine, MD  Patient Care Team: Abdul Fine, MD as PCP - General (Family Medicine) Hester Wolm PARAS, MD as Consulting Physician (Cardiology) [facility]  Extended Emergency Contact Information Primary Emergency Contact: Stefan Rollo HERO Address: [address]          [place], [place] [address] [place] Home Phone: [phone] Relation: Daughter  Code Status:  DNR Goals of care: Advanced Directive information    01/09/2024    9:38 AM  Advanced Directives  Does Patient Have a Medical Advance Directive? Yes  Type of Advance Directive Out of facility DNR (pink MOST or yellow form)  Does patient want to make changes to medical advance directive? No - Patient declined     Chief Complaint  Patient presents with   ToeNails    ToeNails.     HPI:  Pt is a 78 y.o. female seen today for an acute visit for ToeNails.   Patient is nonverbal secondary to previous stroke. Left sided weakness. When attempting to touch her feet, patient moves her right leg and holds up her middle finger repeatedly.   Per nursing she has thrown her cup and kicked staff when efforts are made to perform care for the patient.  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   Disorder of kidney 12/28/2014   Gait instability 04/18/2015   Osteoarthritis 03/28/2015   Presbyesophagus 04/16/2016   Per MBS 04/2016   Schizophrenia (HCC) 03/28/2015   Scoliosis 12/28/2014   Spinal stenosis 12/28/2014   Past Surgical History:  Procedure Laterality Date   HIP  FRACTURE SURGERY Left     Allergies  Allergen Reactions   Ciprofloxacin Nausea And Vomiting   Penicillins     rash, hives   Sodium Thiosulfate    Sulfa Antibiotics     Outpatient Encounter Medications as of 01/10/2024  Medication Sig   acetaminophen  (TYLENOL ) 650 MG CR tablet Take 650 mg by mouth every 4 (four) hours as needed for pain.   aspirin  EC 81 MG EC tablet Take 1 tablet (81 mg total) by mouth daily.   Cholecalciferol (D3-1000) 25 MCG (1000 UT) tablet Take 1,000 Units by mouth daily.   Infant Care Products [facility]) OINT Apply to sacrum, Coccyx,buttocks topically as needed   Multiple Vitamin (MULTIVITAMIN) tablet Take 1 tablet by mouth at bedtime.   nystatin cream (MYCOSTATIN) Apply 1 Application topically daily as needed for dry skin.   Petrolatum (PETROLEUM JELLY) OINT Apply 1 application  topically daily as needed (to left foot).   vitamin B-12 (CYANOCOBALAMIN) 500 MCG tablet Take 1,000 mcg by mouth daily.   No facility-administered encounter medications on file as of 01/10/2024.    Review of Systems  Immunization History  Administered Date(s) Administered   Influenza Split 04/18/2011   Influenza,inj,Quad PF,6+ Mos 07/22/2015   Moderna SARS-COV2 Booster Vaccination 11/18/2020   Moderna Sars-Covid-2 Vaccination 07/13/2019, 08/10/2019, 05/13/2020   PNEUMOCOCCAL CONJUGATE-20 09/26/2022   Pneumococcal Conjugate-13 07/22/2015   Tdap 04/18/2011   Pertinent  Health Maintenance Due  Topic Date Due   INFLUENZA VACCINE  01/31/2024  DEXA SCAN  Completed   Colonoscopy  Discontinued      10/23/2016    3:11 PM 01/29/2019   12:23 PM 10/08/2023    1:18 PM 11/14/2023    4:39 PM  Fall Risk  Falls in the past year? Yes  --  0 0  Number of falls in past year - Comments  Emmi Telephone Survey Actual Response =      Was there an injury with Fall?    0  Fall Risk Category Calculator    0  Patient at Risk for Falls Due to Impaired mobility      Fall risk Follow up Falls  prevention discussed         Data saved with a previous flowsheet row definition   Functional Status Survey:    Vitals:   01/10/24 1347  BP: 138/64  Pulse: 98  Resp: 16  Temp: 97.9 F (36.6 C)  SpO2: 95%  Weight: 140 lb 3.2 oz (63.6 kg)  Height: 5' 5 (1.651 m)   Body mass index is 23.33 kg/m. Physical Exam HENT:     Mouth/Throat:     Pharynx: Posterior oropharyngeal erythema present.  Skin:    Comments: Toenails that are overgrown and curled.   Neurological:     Mental Status: She is alert.     Labs reviewed: Recent Labs    08/08/23 0000  NA 144  K 4.2  CL 102  CO2 34*  BUN 19  CREATININE 0.5  CALCIUM 8.9   Recent Labs    08/08/23 0000  AST 16  ALT 10  ALKPHOS 104  ALBUMIN 3.6   Recent Labs    08/08/23 0000  WBC 11.6  NEUTROABS 6,960.00  HGB 14.3  HCT 44  PLT 321   Lab Results  Component Value Date   TSH 2.31 08/08/2023   Lab Results  Component Value Date   HGBA1C 5.7 (H) 03/06/2017   No results found for: CHOL, HDL, LDLCALC, LDLDIRECT, TRIG, CHOLHDL  Significant Diagnostic Results in last 30 days:  No results found.  Assessment/Plan Onychogryphosis  Bipolar disorder, in partial remission, most recent episode depressed [facility]) Patient is noncompliant to treatment plan at this time. Will address at a different time. Declines interventions. Of note, this has occurred numerous times with her nurses and caregivers. If necessary consider pre-treatment in the future.   Family/ staff Communication: nursing  Labs/tests ordered:  none

## 2024-01-12 ENCOUNTER — Encounter: Payer: Self-pay | Admitting: Student

## 2024-01-20 DIAGNOSIS — R1312 Dysphagia, oropharyngeal phase: Secondary | ICD-10-CM | POA: Diagnosis not present

## 2024-02-25 DIAGNOSIS — R278 Other lack of coordination: Secondary | ICD-10-CM | POA: Diagnosis not present

## 2024-02-25 DIAGNOSIS — I69352 Hemiplegia and hemiparesis following cerebral infarction affecting left dominant side: Secondary | ICD-10-CM | POA: Diagnosis not present

## 2024-02-25 DIAGNOSIS — R1312 Dysphagia, oropharyngeal phase: Secondary | ICD-10-CM | POA: Diagnosis not present

## 2024-02-25 DIAGNOSIS — M6281 Muscle weakness (generalized): Secondary | ICD-10-CM | POA: Diagnosis not present

## 2024-02-25 DIAGNOSIS — J441 Chronic obstructive pulmonary disease with (acute) exacerbation: Secondary | ICD-10-CM | POA: Diagnosis not present

## 2024-03-24 ENCOUNTER — Encounter: Payer: Self-pay | Admitting: Nurse Practitioner

## 2024-03-24 ENCOUNTER — Non-Acute Institutional Stay (SKILLED_NURSING_FACILITY): Payer: Self-pay | Admitting: Nurse Practitioner

## 2024-03-24 DIAGNOSIS — L602 Onychogryphosis: Secondary | ICD-10-CM | POA: Diagnosis not present

## 2024-03-24 DIAGNOSIS — M159 Polyosteoarthritis, unspecified: Secondary | ICD-10-CM | POA: Diagnosis not present

## 2024-03-24 DIAGNOSIS — I69354 Hemiplegia and hemiparesis following cerebral infarction affecting left non-dominant side: Secondary | ICD-10-CM

## 2024-03-24 DIAGNOSIS — F3175 Bipolar disorder, in partial remission, most recent episode depressed: Secondary | ICD-10-CM | POA: Diagnosis not present

## 2024-03-24 DIAGNOSIS — F03C3 Unspecified dementia, severe, with mood disturbance: Secondary | ICD-10-CM | POA: Diagnosis not present

## 2024-03-24 NOTE — Progress Notes (Unsigned)
 Location:  Other [place].  Nursing Home Room Number: [facility] [id number] Place of Service:  SNF [phone]) Harlene An, NP  PCP: Abdul Fine, MD  Patient Care Team: Abdul Fine, MD as PCP - General (Family Medicine) Hester Wolm PARAS, MD as Consulting Physician (Cardiology) [facility]  Extended Emergency Contact Information Primary Emergency Contact: Stefan Rollo HERO Address: [address]          [place], [place] [address] [place] Home Phone: [phone] Relation: Daughter  Goals of care: Advanced Directive information    03/24/2024    3:30 PM  Advanced Directives  Does Patient Have a Medical Advance Directive? Yes  Type of Advance Directive Out of facility DNR (pink MOST or yellow form)  Does patient want to make changes to medical advance directive? No - Patient declined     Chief Complaint  Patient presents with   Medical Management of Chronic Issues    Medical Management of Chronic Issues.     HPI:  Pt is a 78 y.o. female seen today for medical management of chronic disease. Pt with hx of dementia, CVA, OA, bipolar. She is nonverbal and relies on staff for care.  She will fed herself.  No new skin breakdown per nursing Has had positive weight gain She has overgrown toenails but will not allow for nail care. Draws up and tries to kick when feet are assessed.  Nursing has no acute concerns at this time.     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   Cholecalciferol (D3-1000) 25 MCG (1000 UT) tablet Take 1,000 Units by mouth daily.   Infant Care Products [facility]) OINT Apply to sacrum, Coccyx,buttocks topically as needed   Multiple Vitamin (MULTIVITAMIN) tablet Take 1 tablet by mouth at bedtime.   nystatin cream (MYCOSTATIN) Apply 1 Application topically daily as needed for dry skin.   Petrolatum (PETROLEUM JELLY) OINT Apply 1 application  topically daily as needed (to left foot).   vitamin B-12 (CYANOCOBALAMIN) 500 MCG tablet Take 1,000 mcg by mouth daily.   acetaminophen  (TYLENOL ) 650 MG CR tablet Take 650 mg by mouth every 4 (four) hours as needed for pain. (Patient not taking: Reported on 03/24/2024)   No facility-administered encounter medications on file as of 03/24/2024.    Review of Systems  Unable to perform ROS: Dementia     Immunization History  Administered Date(s) Administered   Influenza Split 04/18/2011   Influenza,inj,Quad PF,6+ Mos 07/22/2015   Moderna SARS-COV2 Booster Vaccination 11/18/2020   Moderna Sars-Covid-2  Vaccination 07/13/2019, 08/10/2019, 05/13/2020   PNEUMOCOCCAL CONJUGATE-20 09/26/2022   Pneumococcal Conjugate-13 07/22/2015   Tdap 04/18/2011   Pertinent  Health Maintenance Due  Topic Date Due   Influenza Vaccine  01/31/2024   DEXA SCAN  Completed   Colonoscopy  Discontinued      10/23/2016    3:11 PM 01/29/2019   12:23 PM 10/08/2023    1:18 PM 11/14/2023    4:39 PM  Fall Risk  Falls in the past year? Yes  --  0 0  Number of falls in past year - Comments  Emmi Telephone  Survey Actual Response =      Was there an injury with Fall?    0  Fall Risk Category Calculator    0  Patient at Risk for Falls Due to Impaired mobility      Fall risk Follow up Falls prevention discussed         Data saved with a previous flowsheet row definition   Functional Status Survey:    Vitals:   03/24/24 1527  BP: 118/63  Pulse: 87  Resp: 20  Temp: 98 F (36.7 C)  SpO2: 92%  Weight: 145 lb 8 oz (66 kg)  Height: 5' 5 (1.651 m)   Body mass index is 24.21 kg/m. Wt Readings from Last 3 Encounters:  03/24/24 145 lb 8 oz (66 kg)  01/10/24 140 lb 3.2 oz (63.6 kg)  01/09/24 140 lb 3.2 oz (63.6 kg)    Physical Exam Constitutional:      General: She is not in acute distress.    Appearance: She is well-developed. She is not diaphoretic.  HENT:     Head: Normocephalic and atraumatic.     Mouth/Throat:     Pharynx: No oropharyngeal exudate.  Eyes:     Conjunctiva/sclera: Conjunctivae normal.     Pupils: Pupils are equal, round, and reactive to light.  Cardiovascular:     Rate and Rhythm: Normal rate and regular rhythm.     Heart sounds: Normal heart sounds.  Pulmonary:     Effort: Pulmonary effort is normal.     Breath sounds: Normal breath sounds.  Abdominal:     General: Bowel sounds are normal.     Palpations: Abdomen is soft.  Musculoskeletal:     Cervical back: Normal range of motion and neck supple.     Right lower leg: No edema.     Left lower leg: No edema.  Skin:    General: Skin is warm and dry.  Neurological:     Mental Status: She is alert. Mental status is at baseline.     Motor: Weakness present.     Gait: Gait abnormal.     Comments: Left sided hemiparesis.   Psychiatric:        Mood and Affect: Mood normal.     Labs reviewed: Recent Labs    08/08/23 0000  NA 144  K 4.2  CL 102  CO2 34*  BUN 19  CREATININE 0.5  CALCIUM 8.9   Recent Labs    08/08/23 0000  AST 16  ALT 10  ALKPHOS 104  ALBUMIN 3.6   Recent Labs     08/08/23 0000  WBC 11.6  NEUTROABS 6,960.00  HGB 14.3  HCT 44  PLT 321   Lab Results  Component Value Date   TSH 2.31 08/08/2023   Lab Results  Component Value Date   HGBA1C 5.7 (H) 03/06/2017   No results found for: CHOL, HDL, LDLCALC,  LDLDIRECT, TRIG, CHOLHDL  Significant Diagnostic Results in last 30 days:  No results found.  Assessment/Plan Severe dementia with mood disturbance, unspecified dementia type (HCC) Stable, no acute changes in cognitive or functional status, continue supportive care.   Overgrown nail Does not allow for personal care most of the time.  Staff attempts when able.   Osteoarthritis Stable, without signs of worsening pain.   Hemiparesis affecting left side as late effect of cerebrovascular accident (CVA) (HCC) Stable, dependant on staff for all ADLs. Continue supportive care.   Affective bipolar disorder (HCC) Stable, no current medications at this time     Cinde Ebert K. Caro BODILY [facility] Adult [facility] [phone]

## 2024-03-25 DIAGNOSIS — L602 Onychogryphosis: Secondary | ICD-10-CM | POA: Insufficient documentation

## 2024-03-25 NOTE — Assessment & Plan Note (Signed)
 Stable, without signs of worsening pain.

## 2024-03-25 NOTE — Assessment & Plan Note (Signed)
 Stable, no current medications at this time

## 2024-03-25 NOTE — Assessment & Plan Note (Signed)
 Stable, dependant on staff for all ADLs. Continue supportive care.

## 2024-03-25 NOTE — Assessment & Plan Note (Signed)
 Stable, no acute changes in cognitive or functional status, continue supportive care.

## 2024-03-25 NOTE — Assessment & Plan Note (Signed)
 Does not allow for personal care most of the time.  Staff attempts when able.

## 2024-04-17 DIAGNOSIS — M6250 Muscle wasting and atrophy, not elsewhere classified, unspecified site: Secondary | ICD-10-CM | POA: Diagnosis not present

## 2024-04-17 DIAGNOSIS — R532 Functional quadriplegia: Secondary | ICD-10-CM | POA: Diagnosis not present

## 2024-04-17 DIAGNOSIS — R54 Age-related physical debility: Secondary | ICD-10-CM | POA: Diagnosis not present

## 2024-04-17 DIAGNOSIS — R630 Anorexia: Secondary | ICD-10-CM | POA: Diagnosis not present

## 2024-04-17 DIAGNOSIS — R627 Adult failure to thrive: Secondary | ICD-10-CM | POA: Diagnosis not present

## 2024-04-17 DIAGNOSIS — I6930 Unspecified sequelae of cerebral infarction: Secondary | ICD-10-CM | POA: Diagnosis not present

## 2024-04-17 DIAGNOSIS — R531 Weakness: Secondary | ICD-10-CM | POA: Diagnosis not present

## 2024-04-17 DIAGNOSIS — R4189 Other symptoms and signs involving cognitive functions and awareness: Secondary | ICD-10-CM | POA: Diagnosis not present

## 2024-04-17 DIAGNOSIS — R1312 Dysphagia, oropharyngeal phase: Secondary | ICD-10-CM | POA: Diagnosis not present

## 2024-04-17 DIAGNOSIS — F809 Developmental disorder of speech and language, unspecified: Secondary | ICD-10-CM | POA: Diagnosis not present

## 2024-04-17 DIAGNOSIS — F200 Paranoid schizophrenia: Secondary | ICD-10-CM | POA: Diagnosis not present

## 2024-04-17 DIAGNOSIS — R4701 Aphasia: Secondary | ICD-10-CM | POA: Diagnosis not present

## 2024-05-05 ENCOUNTER — Encounter: Payer: Self-pay | Admitting: Internal Medicine

## 2024-05-05 ENCOUNTER — Non-Acute Institutional Stay (SKILLED_NURSING_FACILITY): Payer: Self-pay | Admitting: Internal Medicine

## 2024-05-05 DIAGNOSIS — I69354 Hemiplegia and hemiparesis following cerebral infarction affecting left non-dominant side: Secondary | ICD-10-CM

## 2024-05-05 DIAGNOSIS — R4701 Aphasia: Secondary | ICD-10-CM

## 2024-05-05 DIAGNOSIS — J449 Chronic obstructive pulmonary disease, unspecified: Secondary | ICD-10-CM

## 2024-05-05 DIAGNOSIS — F03C3 Unspecified dementia, severe, with mood disturbance: Secondary | ICD-10-CM | POA: Diagnosis not present

## 2024-05-05 DIAGNOSIS — Z66 Do not resuscitate: Secondary | ICD-10-CM

## 2024-05-05 DIAGNOSIS — Z7401 Bed confinement status: Secondary | ICD-10-CM

## 2024-05-05 NOTE — Assessment & Plan Note (Addendum)
 Pt is completely dependent on SNF staff for all of her care.

## 2024-05-05 NOTE — Assessment & Plan Note (Addendum)
 SABRA

## 2024-05-05 NOTE — Progress Notes (Signed)
 [facility] SN[facility] Routine Visit Progress Note    Location:   [place].   Place of Service:   [facility] [place] [facility]   PCP: Laurence Locus, DO   Patient Care Team: Laurence Locus, DO as PCP - General (Internal Medicine) Hester Wolm PARAS, MD as Consulting Physician (Cardiology) [facility]   Extended Emergency Contact Information Primary Emergency Contact: Stefan Roll[place] HERO Address: [address]          [place], [place] [address] [place] Home Phone: [phone] Relation: Daughter   Goals of care: Advanced Directive information    03/24/2024    3:30 PM  Advanced Directives  Does Patient Have a Medical Advance Directive? Yes  Type of Advance Directive Out of facility DNR (pink MOST or yellow form)  Does patient want to make changes to medical advance directive? No - Patient declined    CODE STATUS: Do Not Resuscitate (DNR)   Chief Complaint  Patient presents with   Medical Management of Chronic Issues    Medical Management of Chronic Issues.      HPI: Pt is a 78 y.o. female seen today for medical management of chronic disease.  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  Patient appears to understand questions but is aphasic.  She has dense left-sided hemiparesis.  She is able to shake her head yes and no to some questions.  Denies any abdominal pain but does shake her head yes to pain in her legs.  Nurses state the patient is nonverbal.  No family at bedside.  No concerns from nursing staff.     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   aspirin  EC 81 MG EC tablet Take 1 tablet (81 mg total) by mouth daily.   Cholecalciferol (D3-1000) 25 MCG (1000 UT) tablet Take 1,000 Units by mouth daily.   Infant Care Products [facility]) OINT Apply to sacrum, Coccyx,buttocks topically as needed   Multiple Vitamin (MULTIVITAMIN) tablet Take 1 tablet by mouth at bedtime.   nystatin cream (MYCOSTATIN) Apply 1 Application topically daily as needed for dry skin.   Petrolatum (PETROLEUM JELLY) OINT Apply 1 application  topically daily as needed (to left foot).   vitamin B-12 (CYANOCOBALAMIN) 500 MCG tablet Take  1,000 mcg by mouth daily.   No facility-administered encounter medications on file as of 05/05/2024.     Review of Systems  Unable to perform ROS: Patient nonverbal      Immunization History  Administered Date(s) Administered   Influenza Split 04/18/2011   Influenza,inj,Quad PF,6+ Mos 07/22/2015   Moderna SARS-COV2 Booster Vaccination 11/18/2020   Moderna Sars-Covid-2 Vaccination 07/13/2019, 08/10/2019, 05/13/2020   PNEUMOCOCCAL CONJUGATE-20 09/26/2022   Pneumococcal Conjugate-13 07/22/2015   Tdap 04/18/2011    Pertinent  Health Maintenance Due  Topic Date Due   Influenza Vaccine  01/31/2024   DEXA SCAN  Completed   Colonoscopy  Discontinued      10/23/2016    3:11 PM 01/29/2019   12:23 PM 10/08/2023    1:18 PM 11/14/2023    4:39 PM  Fall Risk  Falls in the past year? Yes  --  0 0  Number of falls in past year - Comments  Emmi Telephone Survey Actual Response =      Was there an injury with Fall?    0  Fall Risk Category Calculator    0  Patient at Risk for Falls Due to Impaired mobility      Fall risk Follow up Falls prevention discussed         Data saved with a previous flowsheet row definition   Functional Status Survey:     Vitals:   05/05/24 1419  BP: (!) 148/74  Pulse: 88  Resp: 20  Temp: (!) 97.5 F (36.4 C)  SpO2: 93%  Weight: 144 lb 12.8 oz (65.7 kg)  Height: 5' 5 (1.651 m)   Body mass index is 24.1 kg/m. Physical Exam Vitals and nursing note reviewed.  Constitutional:      Comments: Awakens with verbal stimuli. Can shake her head yes and no to some questions  HENT:     Head: Normocephalic and atraumatic.  Eyes:     General: No scleral icterus. Cardiovascular:     Rate and Rhythm: Normal rate and regular rhythm.  Pulmonary:     Effort: Pulmonary effort is normal. No respiratory distress.  Abdominal:     General: Abdomen is flat. Bowel sounds are normal.  Musculoskeletal:     Comments: Left UE held in flexion at the elbow and adduction at the shoulder and internal rotation(like her arm was held in a arm sling).  Skin:    Capillary Refill: Capillary refill takes less than 2 seconds.  Neurological:     Comments: Dense left sided hemiparesis. Pt is nonverbal. Pt uses her right hand to hit this writer's arm when I tried to examine her left UE      Labs reviewed: Recent Labs    08/08/23 0000  NA 144  K 4.2  CL 102  CO2 34*  BUN 19  CREATININE 0.5  CALCIUM 8.9   Recent Labs    08/08/23 0000  AST 16  ALT 10  ALKPHOS 104  ALBUMIN 3.6   Recent  Labs    08/08/23 0000  WBC 11.6  NEUTROABS 6,960.00  HGB 14.3  HCT 44  PLT 321   Lab Results  Component Value Date   TSH 2.31 08/08/2023   Lab Results  Component Value Date   HGBA1C 5.7 (H) 03/06/2017    Assessment/Plan Assessment & Plan Hemiparesis affecting left side as late effect of cerebrovascular accident (CVA) (HCC) Pt is completely dependent on SNF staff for all of her care.     Severe dementia with  mood disturbance, unspecified dementia type (HCC) Chronic. Stable. Pt is not on any anti-psychotics or anti-depressants.     Nonverbal Chronic. Pt seems to understand some language. Hit this clinical research associate with her right hand when I tried to examine her left UE. Prior reports that pt kicks at nursing staff when foot care attempted.     Bedridden Pt is completely dependent on SNF staff for all of her care.     Chronic obstructive pulmonary disease, unspecified COPD type (HCC) Stable. On RA. Not on any inhalers or nebulizer treatments.     DNR (do not resuscitate)        Camellia Door, DO  [facility] & Adult Medicine [phone]

## 2024-05-05 NOTE — Assessment & Plan Note (Addendum)
 Chronic. Stable. Pt is not on any anti-psychotics or anti-depressants.

## 2024-05-05 NOTE — Assessment & Plan Note (Deleted)
 Stable. Pt is on RA. Not on any inhalers or nebs.

## 2024-05-05 NOTE — Assessment & Plan Note (Addendum)
 Stable. On RA. Not on any inhalers or nebulizer treatments.

## 2024-05-05 NOTE — Assessment & Plan Note (Addendum)
 Chronic. Pt seems to understand some language. Hit this clinical research associate with her right hand when I tried to examine her left UE. Prior reports that pt kicks at nursing staff when foot care attempted.

## 2024-05-08 DIAGNOSIS — F809 Developmental disorder of speech and language, unspecified: Secondary | ICD-10-CM | POA: Diagnosis not present

## 2024-05-08 DIAGNOSIS — R627 Adult failure to thrive: Secondary | ICD-10-CM | POA: Diagnosis not present

## 2024-05-08 DIAGNOSIS — R532 Functional quadriplegia: Secondary | ICD-10-CM | POA: Diagnosis not present

## 2024-05-08 DIAGNOSIS — M6250 Muscle wasting and atrophy, not elsewhere classified, unspecified site: Secondary | ICD-10-CM | POA: Diagnosis not present

## 2024-05-08 DIAGNOSIS — I69359 Hemiplegia and hemiparesis following cerebral infarction affecting unspecified side: Secondary | ICD-10-CM | POA: Diagnosis not present

## 2024-05-08 DIAGNOSIS — R4701 Aphasia: Secondary | ICD-10-CM | POA: Diagnosis not present

## 2024-05-08 DIAGNOSIS — R4189 Other symptoms and signs involving cognitive functions and awareness: Secondary | ICD-10-CM | POA: Diagnosis not present

## 2024-05-08 DIAGNOSIS — R630 Anorexia: Secondary | ICD-10-CM | POA: Diagnosis not present

## 2024-05-08 DIAGNOSIS — I6930 Unspecified sequelae of cerebral infarction: Secondary | ICD-10-CM | POA: Diagnosis not present

## 2024-05-08 DIAGNOSIS — R54 Age-related physical debility: Secondary | ICD-10-CM | POA: Diagnosis not present

## 2024-05-08 DIAGNOSIS — R1312 Dysphagia, oropharyngeal phase: Secondary | ICD-10-CM | POA: Diagnosis not present

## 2024-05-08 DIAGNOSIS — Z789 Other specified health status: Secondary | ICD-10-CM | POA: Diagnosis not present

## 2024-06-02 ENCOUNTER — Encounter: Payer: Self-pay | Admitting: Nurse Practitioner

## 2024-06-02 ENCOUNTER — Non-Acute Institutional Stay (SKILLED_NURSING_FACILITY): Payer: Self-pay | Admitting: Nurse Practitioner

## 2024-06-02 DIAGNOSIS — I69354 Hemiplegia and hemiparesis following cerebral infarction affecting left non-dominant side: Secondary | ICD-10-CM

## 2024-06-02 DIAGNOSIS — J449 Chronic obstructive pulmonary disease, unspecified: Secondary | ICD-10-CM

## 2024-06-02 DIAGNOSIS — M159 Polyosteoarthritis, unspecified: Secondary | ICD-10-CM

## 2024-06-02 DIAGNOSIS — F03C3 Unspecified dementia, severe, with mood disturbance: Secondary | ICD-10-CM

## 2024-06-02 DIAGNOSIS — F3175 Bipolar disorder, in partial remission, most recent episode depressed: Secondary | ICD-10-CM | POA: Diagnosis not present

## 2024-06-02 NOTE — Assessment & Plan Note (Signed)
 Stable, without signs of worsening pain.  Has tylenol  PRN

## 2024-06-02 NOTE — Assessment & Plan Note (Signed)
 Stable, continues on ASA daily

## 2024-06-02 NOTE — Assessment & Plan Note (Signed)
 Stable, no acute changes in cognitive or functional status, continue supportive care.

## 2024-06-02 NOTE — Assessment & Plan Note (Signed)
 Controlled, does not require medication

## 2024-06-02 NOTE — Progress Notes (Signed)
 Location:  Other [place].  Nursing Home Room Number: [facility] [id number] Place of Service:  SNF [phone]) Harlene An, NP  PCP: Laurence Locus, DO  Patient Care Team: Laurence Locus, DO as PCP - General (Internal Medicine) Hester Wolm PARAS, MD as Consulting Physician (Cardiology) [facility]  Extended Emergency Contact Information Primary Emergency Contact: Stefan Rollo HERO Address: [address]          [place], [place] [address] [place] Home Phone: [phone] Relation: Daughter  Goals of care: Advanced Directive information    06/02/2024    1:42 PM  Advanced Directives  Does Patient Have a Medical Advance Directive? Yes  Type of Advance Directive Out of facility DNR (pink MOST or yellow form)  Does patient want to make changes to medical advance directive? No - Patient declined     Chief Complaint  Patient presents with   Medical Management of Chronic Issues    Medical Management of Chronic Issues.     HPI:  Pt is a 78 y.o. female seen today for medical management of chronic disease. Pt with advanced dementia, nonverbal due to aphagia from CVA.  Hx of chronic pain.  She requires assistance for ADLs by staff but is able to feed herself.  Weight has been stable.  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   Cholecalciferol (D3-1000) 25 MCG (1000 UT) tablet Take 1,000 Units by mouth daily.   Infant Care Products [facility]) OINT Apply to sacrum, Coccyx,buttocks topically as needed   Multiple Vitamin (MULTIVITAMIN) tablet Take 1 tablet by mouth at bedtime.   nystatin cream (MYCOSTATIN) Apply 1 Application topically daily as needed for dry skin.   Petrolatum (PETROLEUM JELLY) OINT Apply 1 application  topically daily as needed (to left foot).   vitamin B-12 (CYANOCOBALAMIN) 500 MCG tablet Take 1,000 mcg by mouth daily.   acetaminophen  (TYLENOL ) 500 MG tablet Take 1,000 mg by mouth every 8 (eight) hours as needed. (Patient not taking: Reported on 06/02/2024)   No facility-administered encounter medications on file as of 06/02/2024.    Review of Systems  Unable to perform ROS: Dementia     Immunization History  Administered Date(s) Administered   Influenza Split 04/18/2011   Influenza,inj,Quad PF,6+ Mos 07/22/2015   Moderna SARS-COV2 Booster Vaccination 11/18/2020   Moderna Sars-Covid-2 Vaccination 07/13/2019, 08/10/2019, 05/13/2020   PNEUMOCOCCAL CONJUGATE-20 09/26/2022  Pneumococcal Conjugate-13 07/22/2015   Tdap 04/18/2011   Pertinent  Health Maintenance Due  Topic Date Due   Influenza Vaccine  01/31/2024   Bone Density Scan  Completed   Colonoscopy  Discontinued      10/23/2016    3:11 PM 01/29/2019   12:23 PM 10/08/2023    1:18 PM 11/14/2023    4:39 PM  Fall Risk  Falls in the past year? Yes  --  0 0  Number of falls in past year - Comments   Emmi Telephone Survey Actual Response =      Was there an injury with Fall?    0   Fall Risk Category Calculator    0  Patient at Risk for Falls Due to Impaired mobility      Fall risk Follow up Falls prevention discussed         Data saved with a previous flowsheet row definition   Functional Status Survey:    Vitals:   06/02/24 1339  BP: 139/64  Pulse: 88  Resp: 18  Temp: (!) 96.4 F (35.8 C)  SpO2: 91%  Weight: 143 lb 9.6 oz (65.1 kg)  Height: 5' 5 (1.651 m)   Body mass index is 23.9 kg/m. Filed Weights   06/02/24 1339  Weight: 143 lb 9.6 oz (65.1 kg)    Physical Exam Constitutional:      General: She is not in acute distress.    Appearance: She is well-developed. She is not diaphoretic.  HENT:     Head: Normocephalic and atraumatic.     Mouth/Throat:     Pharynx: No oropharyngeal exudate.  Eyes:     Conjunctiva/sclera: Conjunctivae normal.     Pupils: Pupils are equal, round, and reactive to light.  Cardiovascular:     Rate and Rhythm: Normal rate and regular rhythm.     Heart sounds: Normal heart sounds.  Pulmonary:     Effort: Pulmonary effort is normal.     Breath sounds: Normal breath sounds.  Abdominal:     General: Bowel sounds are normal.     Palpations: Abdomen is soft.  Musculoskeletal:        General: No tenderness.     Cervical back: Normal range of motion and neck supple.  Skin:    General: Skin is warm and dry.  Neurological:     Mental Status: She is alert. Mental status is at baseline. She is disoriented.     Motor: Weakness present.     Gait: Gait abnormal.     Comments: Left sided hemiparesis.      Labs reviewed: Recent Labs    08/08/23 0000  NA 144  K 4.2  CL 102  CO2 34*  BUN 19  CREATININE 0.5  CALCIUM 8.9   Recent Labs    08/08/23 0000  AST 16  ALT 10  ALKPHOS 104  ALBUMIN 3.6   Recent Labs    08/08/23 0000  WBC 11.6  NEUTROABS 6,960.00  HGB 14.3  HCT 44  PLT 321   Lab Results  Component Value Date    TSH 2.31 08/08/2023   Lab Results  Component Value Date   HGBA1C 5.7 (H) 03/06/2017   No results found for: CHOL, HDL, LDLCALC, LDLDIRECT, TRIG, CHOLHDL  Significant Diagnostic Results in last 30 days:  No results found.  Assessment/Plan Severe dementia with mood disturbance, unspecified dementia type (HCC) Stable, no acute changes in cognitive or functional status, continue supportive care.   Osteoarthritis Stable, without  signs of worsening pain.  Has tylenol  PRN  Hemiparesis affecting left side as late effect of cerebrovascular accident (CVA) (HCC) Stable, continues on ASA daily   COPD (chronic obstructive pulmonary disease) (HCC) Controlled, does not require medication  Affective bipolar disorder (HCC) Stable, no current medications at this time     Diana Mccoy K. Caro BODILY [facility] & Adult Medicine [phone]

## 2024-06-02 NOTE — Assessment & Plan Note (Signed)
 Stable, no current medications at this time

## 2024-07-09 ENCOUNTER — Encounter: Payer: Self-pay | Admitting: Nurse Practitioner

## 2024-07-09 ENCOUNTER — Non-Acute Institutional Stay (SKILLED_NURSING_FACILITY): Admitting: Nurse Practitioner

## 2024-07-09 DIAGNOSIS — J449 Chronic obstructive pulmonary disease, unspecified: Secondary | ICD-10-CM

## 2024-07-09 DIAGNOSIS — F2089 Other schizophrenia: Secondary | ICD-10-CM | POA: Diagnosis not present

## 2024-07-09 DIAGNOSIS — M159 Polyosteoarthritis, unspecified: Secondary | ICD-10-CM | POA: Diagnosis not present

## 2024-07-09 DIAGNOSIS — I69354 Hemiplegia and hemiparesis following cerebral infarction affecting left non-dominant side: Secondary | ICD-10-CM

## 2024-07-09 DIAGNOSIS — F3175 Bipolar disorder, in partial remission, most recent episode depressed: Secondary | ICD-10-CM | POA: Diagnosis not present

## 2024-07-09 DIAGNOSIS — F03C3 Unspecified dementia, severe, with mood disturbance: Secondary | ICD-10-CM | POA: Diagnosis not present

## 2024-07-09 NOTE — Assessment & Plan Note (Signed)
 Mood and behaviors stable at this time. Not on current medication, Will continue to monitor.

## 2024-07-09 NOTE — Assessment & Plan Note (Signed)
 Stable, without signs of worsening pain.   Continues with PRN tylenol 

## 2024-07-09 NOTE — Assessment & Plan Note (Signed)
 Advance dementia, nonverbal.  Needs total care.

## 2024-07-09 NOTE — Assessment & Plan Note (Signed)
 Stable, continues on ASA daily

## 2024-07-09 NOTE — Progress Notes (Signed)
 " Location:  Other [place].  Nursing Home Room Number: [facility] [id number] Place of Service:  SNF [phone]) Harlene An, NP  PCP: Laurence Locus, DO  Patient Care Team: Laurence Locus, DO as PCP - General (Internal Medicine) Hester Wolm PARAS, MD as Consulting Physician (Cardiology) [facility]  Extended Emergency Contact Information Primary Emergency Contact: Stefan Rollo HERO Address: [address]          [place], [place] [address] [place] Home Phone: [phone] Relation: Daughter  Goals of care: Advanced Directive information    06/02/2024    1:42 PM  Advanced Directives  Does Patient Have a Medical Advance Directive? Yes  Type of Advance Directive Out of facility DNR (pink MOST or yellow form)  Does patient want to make changes to medical advance directive? No - Patient declined     Chief Complaint  Patient presents with   Medical Management of Chronic Issues    Medical Management of Chronic Issues.     HPI:  Pt is a 79 y.o. female seen today for medical management of chronic disease. Pt with hx of dementia, bipolar disorder, CVA with hemiparesis, COPD, chronic pain.  She has been doing well in the last month. Nursing has no concerns.  She eating well. Weight has been stable.  She is wearing a brace to left hand due to contracture from hemiplegia.     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   Cholecalciferol (D3-1000) 25 MCG (1000 UT) tablet Take 1,000 Units by mouth daily.   Infant Care Products [facility]) OINT Apply to sacrum, Coccyx,buttocks topically as needed   Multiple Vitamin (MULTIVITAMIN) tablet Take 1 tablet by mouth at bedtime.   nystatin cream (MYCOSTATIN) Apply 1 Application topically daily as needed for dry skin.   Petrolatum (PETROLEUM JELLY) OINT Apply 1 application  topically daily as needed (to left foot).   polyethylene glycol (MIRALAX / GLYCOLAX) 17 g packet Take 17 g by mouth daily as needed.   vitamin B-12 (CYANOCOBALAMIN) 500 MCG tablet Take 1,000 mcg by mouth daily.   acetaminophen  (TYLENOL ) 500 MG tablet Take 1,000 mg by mouth every 8 (eight) hours as needed. (Patient not taking: Reported on 07/09/2024)   No facility-administered encounter medications on file as of 07/09/2024.    Review of Systems  Unable to perform ROS: Dementia     Immunization History  Administered Date(s) Administered   Influenza Split 04/18/2011   Influenza,inj,Quad PF,6+ Mos 07/22/2015   Moderna SARS-COV2 Booster Vaccination 11/18/2020   Moderna Sars-Covid-2 Vaccination 07/13/2019, 08/10/2019, 05/13/2020   PNEUMOCOCCAL CONJUGATE-20 09/26/2022   Pneumococcal Conjugate-13 07/22/2015  Tdap 04/18/2011   Pertinent  Health Maintenance Due  Topic Date Due   Influenza Vaccine  01/31/2024   Bone Density Scan  Completed   Colonoscopy  Discontinued      10/23/2016    3:11 PM 01/29/2019   12:23 PM 10/08/2023    1:18 PM 11/14/2023    4:39 PM  Fall Risk  Falls in the past year? Yes  --  0 0  Number of falls in past year - Comments  Emmi Telephone  Survey Actual Response =      Was there an injury with Fall?    0   Fall Risk Category Calculator    0  Patient at Risk for Falls Due to Impaired mobility      Fall risk Follow up Falls prevention discussed         Data saved with a previous flowsheet row definition   Functional Status Survey:    Vitals:   07/09/24 1058  BP: 115/65  Pulse: 100  Resp: 18  Temp: (!) 97.1 F (36.2 C)  SpO2: 98%  Weight: 143 lb (64.9 kg)  Height: 5' 5 (1.651 m)   Body mass index is 23.8 kg/m. Physical Exam Constitutional:      General: She is not in acute distress.    Appearance: She is well-developed. She is not diaphoretic.  HENT:     Head: Normocephalic and atraumatic.     Mouth/Throat:     Pharynx: No oropharyngeal exudate.  Eyes:     Conjunctiva/sclera: Conjunctivae normal.     Pupils: Pupils are equal, round, and reactive to light.  Cardiovascular:     Rate and Rhythm: Normal rate and regular rhythm.     Heart sounds: Normal heart sounds.  Pulmonary:     Effort: Pulmonary effort is normal.     Breath sounds: Normal breath sounds.  Abdominal:     General: Bowel sounds are normal.     Palpations: Abdomen is soft.  Musculoskeletal:     Cervical back: Normal range of motion and neck supple.     Right lower leg: No edema.     Left lower leg: No edema.  Skin:    General: Skin is warm and dry.  Neurological:     Mental Status: She is alert. She is disoriented.     Comments: Left sided hemiparesis.   Psychiatric:        Mood and Affect: Mood normal.     Labs reviewed: Recent Labs    08/08/23 0000  NA 144  K 4.2  CL 102  CO2 34*  BUN 19  CREATININE 0.5  CALCIUM 8.9   Recent Labs    08/08/23 0000  AST 16  ALT 10  ALKPHOS 104  ALBUMIN 3.6   Recent Labs    08/08/23 0000  WBC 11.6  NEUTROABS 6,960.00  HGB 14.3  HCT 44  PLT 321   Lab Results  Component Value Date   TSH 2.31 08/08/2023   Lab Results  Component Value Date   HGBA1C 5.7 (H) 03/06/2017    No results found for: CHOL, HDL, LDLCALC, LDLDIRECT, TRIG, CHOLHDL  Significant Diagnostic Results in last 30 days:  No results found.  Assessment/Plan Severe dementia with mood disturbance, unspecified dementia type (HCC) Advance dementia, nonverbal.  Needs total care.   Schizophrenia (HCC) Mood and behaviors stable at this time. Not on current medication, Will continue to monitor.   Osteoarthritis Stable, without signs of worsening pain.   Continues  with PRN tylenol    Hemiparesis affecting left side as late effect of cerebrovascular accident (CVA) (HCC) Stable, continues on ASA daily   COPD (chronic obstructive pulmonary disease) (HCC) Controlled, does not require medication  Affective bipolar disorder (HCC) Stable, no current medications at this time   Aanshi Batchelder K. Caro BODILY [facility] [phone]       [1]  Allergies Allergen Reactions   Ciprofloxacin Nausea And Vomiting   Penicillins     rash, hives   Sodium Thiosulfate    Sulfa Antibiotics    "

## 2024-07-09 NOTE — Assessment & Plan Note (Signed)
 Stable, no current medications at this time

## 2024-07-09 NOTE — Assessment & Plan Note (Signed)
 Controlled, does not require medication
